# Patient Record
Sex: Male | Born: 1940 | Race: White | Hispanic: No | Marital: Married | Smoking: Former smoker
Health system: Southern US, Community
[De-identification: ages and names within clinical notes are randomized; demographics above are authoritative.]

## PROBLEM LIST (undated history)

## (undated) DIAGNOSIS — M75121 Complete rotator cuff tear or rupture of right shoulder, not specified as traumatic: Secondary | ICD-10-CM

## (undated) DIAGNOSIS — I2510 Atherosclerotic heart disease of native coronary artery without angina pectoris: Secondary | ICD-10-CM

## (undated) DIAGNOSIS — M1990 Unspecified osteoarthritis, unspecified site: Secondary | ICD-10-CM

## (undated) DIAGNOSIS — Z7901 Long term (current) use of anticoagulants: Secondary | ICD-10-CM

## (undated) DIAGNOSIS — Z8719 Personal history of other diseases of the digestive system: Secondary | ICD-10-CM

## (undated) DIAGNOSIS — Z7902 Long term (current) use of antithrombotics/antiplatelets: Secondary | ICD-10-CM

## (undated) DIAGNOSIS — E119 Type 2 diabetes mellitus without complications: Secondary | ICD-10-CM

## (undated) DIAGNOSIS — R011 Cardiac murmur, unspecified: Secondary | ICD-10-CM

## (undated) DIAGNOSIS — E039 Hypothyroidism, unspecified: Secondary | ICD-10-CM

## (undated) DIAGNOSIS — D367 Benign neoplasm of other specified sites: Secondary | ICD-10-CM

## (undated) DIAGNOSIS — K5792 Diverticulitis of intestine, part unspecified, without perforation or abscess without bleeding: Secondary | ICD-10-CM

## (undated) DIAGNOSIS — K219 Gastro-esophageal reflux disease without esophagitis: Secondary | ICD-10-CM

## (undated) DIAGNOSIS — D126 Benign neoplasm of colon, unspecified: Secondary | ICD-10-CM

## (undated) DIAGNOSIS — I639 Cerebral infarction, unspecified: Secondary | ICD-10-CM

## (undated) DIAGNOSIS — I10 Essential (primary) hypertension: Secondary | ICD-10-CM

## (undated) DIAGNOSIS — E7800 Pure hypercholesterolemia, unspecified: Secondary | ICD-10-CM

## (undated) DIAGNOSIS — D369 Benign neoplasm, unspecified site: Secondary | ICD-10-CM

## (undated) DIAGNOSIS — C4490 Unspecified malignant neoplasm of skin, unspecified: Secondary | ICD-10-CM

## (undated) DIAGNOSIS — R519 Headache, unspecified: Secondary | ICD-10-CM

## (undated) DIAGNOSIS — I779 Disorder of arteries and arterioles, unspecified: Secondary | ICD-10-CM

## (undated) DIAGNOSIS — I4891 Unspecified atrial fibrillation: Secondary | ICD-10-CM

## (undated) DIAGNOSIS — K2270 Barrett's esophagus without dysplasia: Secondary | ICD-10-CM

## (undated) DIAGNOSIS — R7303 Prediabetes: Secondary | ICD-10-CM

## (undated) DIAGNOSIS — C801 Malignant (primary) neoplasm, unspecified: Secondary | ICD-10-CM

## (undated) HISTORY — PX: VASECTOMY: SHX75

## (undated) HISTORY — PX: CARDIAC CATHETERIZATION: SHX172

## (undated) HISTORY — PX: NASAL SINUS SURGERY: SHX719

## (undated) HISTORY — PX: KNEE ARTHROSCOPY: SHX127

---

## 2005-12-04 ENCOUNTER — Ambulatory Visit: Payer: Self-pay | Admitting: Gastroenterology

## 2008-03-20 ENCOUNTER — Ambulatory Visit: Payer: Self-pay | Admitting: Family Medicine

## 2009-03-30 ENCOUNTER — Ambulatory Visit: Payer: Self-pay | Admitting: Cardiology

## 2009-03-30 DIAGNOSIS — I2510 Atherosclerotic heart disease of native coronary artery without angina pectoris: Secondary | ICD-10-CM

## 2009-03-30 HISTORY — PX: CORONARY ANGIOPLASTY WITH STENT PLACEMENT: SHX49

## 2009-03-30 HISTORY — DX: Atherosclerotic heart disease of native coronary artery without angina pectoris: I25.10

## 2010-03-10 ENCOUNTER — Ambulatory Visit: Payer: Self-pay | Admitting: Gastroenterology

## 2010-03-11 LAB — PATHOLOGY REPORT

## 2010-05-05 ENCOUNTER — Ambulatory Visit: Payer: Self-pay | Admitting: Otolaryngology

## 2012-01-05 ENCOUNTER — Ambulatory Visit: Payer: Self-pay | Admitting: Internal Medicine

## 2013-06-05 DIAGNOSIS — I639 Cerebral infarction, unspecified: Secondary | ICD-10-CM

## 2013-06-05 DIAGNOSIS — G459 Transient cerebral ischemic attack, unspecified: Secondary | ICD-10-CM

## 2013-06-05 HISTORY — DX: Cerebral infarction, unspecified: I63.9

## 2013-06-05 HISTORY — DX: Transient cerebral ischemic attack, unspecified: G45.9

## 2013-07-21 ENCOUNTER — Ambulatory Visit: Payer: Self-pay | Admitting: Internal Medicine

## 2013-08-21 DIAGNOSIS — M169 Osteoarthritis of hip, unspecified: Secondary | ICD-10-CM | POA: Insufficient documentation

## 2013-08-21 DIAGNOSIS — I2510 Atherosclerotic heart disease of native coronary artery without angina pectoris: Secondary | ICD-10-CM | POA: Insufficient documentation

## 2013-08-21 DIAGNOSIS — K219 Gastro-esophageal reflux disease without esophagitis: Secondary | ICD-10-CM | POA: Insufficient documentation

## 2013-08-21 DIAGNOSIS — I10 Essential (primary) hypertension: Secondary | ICD-10-CM | POA: Insufficient documentation

## 2013-08-21 DIAGNOSIS — E7800 Pure hypercholesterolemia, unspecified: Secondary | ICD-10-CM | POA: Insufficient documentation

## 2013-08-21 DIAGNOSIS — K5732 Diverticulitis of large intestine without perforation or abscess without bleeding: Secondary | ICD-10-CM | POA: Insufficient documentation

## 2013-08-21 DIAGNOSIS — M1990 Unspecified osteoarthritis, unspecified site: Secondary | ICD-10-CM | POA: Insufficient documentation

## 2013-08-21 DIAGNOSIS — K625 Hemorrhage of anus and rectum: Secondary | ICD-10-CM | POA: Insufficient documentation

## 2013-08-21 DIAGNOSIS — M1610 Unilateral primary osteoarthritis, unspecified hip: Secondary | ICD-10-CM | POA: Insufficient documentation

## 2013-08-21 DIAGNOSIS — D126 Benign neoplasm of colon, unspecified: Secondary | ICD-10-CM | POA: Insufficient documentation

## 2014-06-25 ENCOUNTER — Ambulatory Visit: Payer: Self-pay | Admitting: Gastroenterology

## 2014-06-25 DIAGNOSIS — D122 Benign neoplasm of ascending colon: Secondary | ICD-10-CM | POA: Diagnosis not present

## 2014-06-25 DIAGNOSIS — K5730 Diverticulosis of large intestine without perforation or abscess without bleeding: Secondary | ICD-10-CM | POA: Diagnosis not present

## 2014-06-25 DIAGNOSIS — K210 Gastro-esophageal reflux disease with esophagitis: Secondary | ICD-10-CM | POA: Diagnosis not present

## 2014-06-25 DIAGNOSIS — Z86010 Personal history of colonic polyps: Secondary | ICD-10-CM | POA: Diagnosis not present

## 2014-06-25 DIAGNOSIS — Z955 Presence of coronary angioplasty implant and graft: Secondary | ICD-10-CM | POA: Diagnosis not present

## 2014-06-25 DIAGNOSIS — I10 Essential (primary) hypertension: Secondary | ICD-10-CM | POA: Diagnosis not present

## 2014-06-25 DIAGNOSIS — K2270 Barrett's esophagus without dysplasia: Secondary | ICD-10-CM | POA: Diagnosis not present

## 2014-06-25 DIAGNOSIS — I2510 Atherosclerotic heart disease of native coronary artery without angina pectoris: Secondary | ICD-10-CM | POA: Diagnosis not present

## 2014-06-25 DIAGNOSIS — K449 Diaphragmatic hernia without obstruction or gangrene: Secondary | ICD-10-CM | POA: Diagnosis not present

## 2014-06-25 DIAGNOSIS — K209 Esophagitis, unspecified: Secondary | ICD-10-CM | POA: Diagnosis not present

## 2014-09-28 LAB — SURGICAL PATHOLOGY

## 2014-10-21 DIAGNOSIS — H61031 Chondritis of right external ear: Secondary | ICD-10-CM | POA: Diagnosis not present

## 2014-10-21 DIAGNOSIS — I10 Essential (primary) hypertension: Secondary | ICD-10-CM | POA: Diagnosis not present

## 2014-10-21 DIAGNOSIS — Z85828 Personal history of other malignant neoplasm of skin: Secondary | ICD-10-CM | POA: Diagnosis not present

## 2014-10-21 DIAGNOSIS — Z789 Other specified health status: Secondary | ICD-10-CM | POA: Diagnosis not present

## 2014-10-21 DIAGNOSIS — L570 Actinic keratosis: Secondary | ICD-10-CM | POA: Diagnosis not present

## 2014-10-21 DIAGNOSIS — I2510 Atherosclerotic heart disease of native coronary artery without angina pectoris: Secondary | ICD-10-CM | POA: Diagnosis not present

## 2014-10-21 DIAGNOSIS — Z08 Encounter for follow-up examination after completed treatment for malignant neoplasm: Secondary | ICD-10-CM | POA: Diagnosis not present

## 2014-10-21 DIAGNOSIS — E780 Pure hypercholesterolemia: Secondary | ICD-10-CM | POA: Diagnosis not present

## 2014-11-30 DIAGNOSIS — I2510 Atherosclerotic heart disease of native coronary artery without angina pectoris: Secondary | ICD-10-CM | POA: Diagnosis not present

## 2015-02-24 ENCOUNTER — Other Ambulatory Visit: Payer: Self-pay | Admitting: Neurology

## 2015-02-24 DIAGNOSIS — M79671 Pain in right foot: Secondary | ICD-10-CM | POA: Diagnosis not present

## 2015-02-24 DIAGNOSIS — M722 Plantar fascial fibromatosis: Secondary | ICD-10-CM | POA: Diagnosis not present

## 2015-02-24 DIAGNOSIS — C719 Malignant neoplasm of brain, unspecified: Secondary | ICD-10-CM

## 2015-03-05 ENCOUNTER — Ambulatory Visit
Admission: RE | Admit: 2015-03-05 | Discharge: 2015-03-05 | Disposition: A | Discharge status: Home / Self Care | Payer: Commercial Managed Care - HMO | Source: Ambulatory Visit | Attending: Neurology | Admitting: Neurology

## 2015-03-05 DIAGNOSIS — G939 Disorder of brain, unspecified: Secondary | ICD-10-CM | POA: Diagnosis not present

## 2015-03-05 DIAGNOSIS — C719 Malignant neoplasm of brain, unspecified: Secondary | ICD-10-CM | POA: Insufficient documentation

## 2015-03-05 DIAGNOSIS — H539 Unspecified visual disturbance: Secondary | ICD-10-CM | POA: Diagnosis not present

## 2015-03-05 MED ORDER — GADOBENATE DIMEGLUMINE 529 MG/ML IV SOLN
20.0000 mL | Freq: Once | INTRAVENOUS | Status: AC | PRN
  Administered 2015-03-05: 17 mL via INTRAVENOUS

## 2015-03-25 DIAGNOSIS — G44221 Chronic tension-type headache, intractable: Secondary | ICD-10-CM | POA: Diagnosis not present

## 2015-03-25 DIAGNOSIS — R930 Abnormal findings on diagnostic imaging of skull and head, not elsewhere classified: Secondary | ICD-10-CM | POA: Diagnosis not present

## 2015-03-25 DIAGNOSIS — E669 Obesity, unspecified: Secondary | ICD-10-CM | POA: Diagnosis not present

## 2015-03-25 DIAGNOSIS — C719 Malignant neoplasm of brain, unspecified: Secondary | ICD-10-CM | POA: Diagnosis not present

## 2015-03-25 DIAGNOSIS — G43519 Persistent migraine aura without cerebral infarction, intractable, without status migrainosus: Secondary | ICD-10-CM | POA: Diagnosis not present

## 2015-04-06 DIAGNOSIS — C719 Malignant neoplasm of brain, unspecified: Secondary | ICD-10-CM | POA: Insufficient documentation

## 2015-04-06 DIAGNOSIS — G43119 Migraine with aura, intractable, without status migrainosus: Secondary | ICD-10-CM | POA: Insufficient documentation

## 2015-05-03 DIAGNOSIS — Z08 Encounter for follow-up examination after completed treatment for malignant neoplasm: Secondary | ICD-10-CM | POA: Diagnosis not present

## 2015-05-03 DIAGNOSIS — Z1283 Encounter for screening for malignant neoplasm of skin: Secondary | ICD-10-CM | POA: Diagnosis not present

## 2015-05-03 DIAGNOSIS — Z85828 Personal history of other malignant neoplasm of skin: Secondary | ICD-10-CM | POA: Diagnosis not present

## 2015-05-03 DIAGNOSIS — Z872 Personal history of diseases of the skin and subcutaneous tissue: Secondary | ICD-10-CM | POA: Diagnosis not present

## 2015-05-03 DIAGNOSIS — L728 Other follicular cysts of the skin and subcutaneous tissue: Secondary | ICD-10-CM | POA: Diagnosis not present

## 2015-05-13 DIAGNOSIS — I2510 Atherosclerotic heart disease of native coronary artery without angina pectoris: Secondary | ICD-10-CM | POA: Diagnosis not present

## 2015-05-13 DIAGNOSIS — E7800 Pure hypercholesterolemia, unspecified: Secondary | ICD-10-CM | POA: Diagnosis not present

## 2015-05-13 DIAGNOSIS — I10 Essential (primary) hypertension: Secondary | ICD-10-CM | POA: Diagnosis not present

## 2015-08-13 DIAGNOSIS — M25512 Pain in left shoulder: Secondary | ICD-10-CM | POA: Diagnosis not present

## 2015-09-06 DIAGNOSIS — I10 Essential (primary) hypertension: Secondary | ICD-10-CM | POA: Diagnosis not present

## 2015-09-06 DIAGNOSIS — E039 Hypothyroidism, unspecified: Secondary | ICD-10-CM | POA: Diagnosis not present

## 2015-09-06 DIAGNOSIS — E7800 Pure hypercholesterolemia, unspecified: Secondary | ICD-10-CM | POA: Diagnosis not present

## 2015-09-06 DIAGNOSIS — E669 Obesity, unspecified: Secondary | ICD-10-CM | POA: Diagnosis not present

## 2015-09-06 DIAGNOSIS — Z0000 Encounter for general adult medical examination without abnormal findings: Secondary | ICD-10-CM | POA: Diagnosis not present

## 2015-09-06 DIAGNOSIS — Z79899 Other long term (current) drug therapy: Secondary | ICD-10-CM | POA: Diagnosis not present

## 2015-09-06 DIAGNOSIS — Z125 Encounter for screening for malignant neoplasm of prostate: Secondary | ICD-10-CM | POA: Diagnosis not present

## 2015-09-06 DIAGNOSIS — M25512 Pain in left shoulder: Secondary | ICD-10-CM | POA: Diagnosis not present

## 2015-09-06 DIAGNOSIS — I2510 Atherosclerotic heart disease of native coronary artery without angina pectoris: Secondary | ICD-10-CM | POA: Diagnosis not present

## 2015-09-09 DIAGNOSIS — K22719 Barrett's esophagus with dysplasia, unspecified: Secondary | ICD-10-CM | POA: Diagnosis not present

## 2015-09-09 DIAGNOSIS — K219 Gastro-esophageal reflux disease without esophagitis: Secondary | ICD-10-CM | POA: Diagnosis not present

## 2015-09-22 DIAGNOSIS — Z79899 Other long term (current) drug therapy: Secondary | ICD-10-CM | POA: Diagnosis not present

## 2015-09-22 DIAGNOSIS — E039 Hypothyroidism, unspecified: Secondary | ICD-10-CM | POA: Diagnosis not present

## 2015-09-22 DIAGNOSIS — E7800 Pure hypercholesterolemia, unspecified: Secondary | ICD-10-CM | POA: Diagnosis not present

## 2015-09-22 DIAGNOSIS — Z125 Encounter for screening for malignant neoplasm of prostate: Secondary | ICD-10-CM | POA: Diagnosis not present

## 2015-09-22 DIAGNOSIS — I10 Essential (primary) hypertension: Secondary | ICD-10-CM | POA: Diagnosis not present

## 2015-09-22 DIAGNOSIS — R7309 Other abnormal glucose: Secondary | ICD-10-CM | POA: Diagnosis not present

## 2015-10-25 ENCOUNTER — Ambulatory Visit
Admission: RE | Admit: 2015-10-25 | Payer: Commercial Managed Care - HMO | Source: Ambulatory Visit | Admitting: Gastroenterology

## 2015-10-25 ENCOUNTER — Encounter: Admission: RE | Payer: Self-pay | Source: Ambulatory Visit

## 2015-10-25 SURGERY — ESOPHAGOGASTRODUODENOSCOPY (EGD) WITH PROPOFOL
Anesthesia: General

## 2015-10-27 ENCOUNTER — Encounter: Payer: Self-pay | Admitting: *Deleted

## 2015-10-28 ENCOUNTER — Ambulatory Visit: Payer: Commercial Managed Care - HMO | Admitting: Anesthesiology

## 2015-10-28 ENCOUNTER — Encounter: Payer: Self-pay | Admitting: *Deleted

## 2015-10-28 ENCOUNTER — Ambulatory Visit
Admission: RE | Admit: 2015-10-28 | Discharge: 2015-10-28 | Disposition: A | Discharge status: Home / Self Care | Payer: Commercial Managed Care - HMO | Source: Ambulatory Visit | Attending: Gastroenterology | Admitting: Gastroenterology

## 2015-10-28 ENCOUNTER — Encounter
Admission: RE | Disposition: A | Discharge status: Home / Self Care | Payer: Self-pay | Source: Ambulatory Visit | Attending: Gastroenterology

## 2015-10-28 DIAGNOSIS — Z7982 Long term (current) use of aspirin: Secondary | ICD-10-CM | POA: Diagnosis not present

## 2015-10-28 DIAGNOSIS — K2270 Barrett's esophagus without dysplasia: Secondary | ICD-10-CM | POA: Insufficient documentation

## 2015-10-28 DIAGNOSIS — Z87891 Personal history of nicotine dependence: Secondary | ICD-10-CM | POA: Insufficient documentation

## 2015-10-28 DIAGNOSIS — Z79899 Other long term (current) drug therapy: Secondary | ICD-10-CM | POA: Insufficient documentation

## 2015-10-28 DIAGNOSIS — Z86010 Personal history of colonic polyps: Secondary | ICD-10-CM | POA: Diagnosis not present

## 2015-10-28 DIAGNOSIS — E039 Hypothyroidism, unspecified: Secondary | ICD-10-CM | POA: Diagnosis not present

## 2015-10-28 DIAGNOSIS — M1990 Unspecified osteoarthritis, unspecified site: Secondary | ICD-10-CM | POA: Diagnosis not present

## 2015-10-28 DIAGNOSIS — I2510 Atherosclerotic heart disease of native coronary artery without angina pectoris: Secondary | ICD-10-CM | POA: Insufficient documentation

## 2015-10-28 DIAGNOSIS — E7800 Pure hypercholesterolemia, unspecified: Secondary | ICD-10-CM | POA: Insufficient documentation

## 2015-10-28 DIAGNOSIS — K219 Gastro-esophageal reflux disease without esophagitis: Secondary | ICD-10-CM | POA: Diagnosis not present

## 2015-10-28 DIAGNOSIS — Z791 Long term (current) use of non-steroidal anti-inflammatories (NSAID): Secondary | ICD-10-CM | POA: Insufficient documentation

## 2015-10-28 DIAGNOSIS — Z8673 Personal history of transient ischemic attack (TIA), and cerebral infarction without residual deficits: Secondary | ICD-10-CM | POA: Diagnosis not present

## 2015-10-28 HISTORY — DX: Benign neoplasm of colon, unspecified: D12.6

## 2015-10-28 HISTORY — DX: Atherosclerotic heart disease of native coronary artery without angina pectoris: I25.10

## 2015-10-28 HISTORY — DX: Benign neoplasm of other specified sites: D36.7

## 2015-10-28 HISTORY — DX: Barrett's esophagus without dysplasia: K22.70

## 2015-10-28 HISTORY — DX: Hypothyroidism, unspecified: E03.9

## 2015-10-28 HISTORY — DX: Pure hypercholesterolemia, unspecified: E78.00

## 2015-10-28 HISTORY — PX: ESOPHAGOGASTRODUODENOSCOPY (EGD) WITH PROPOFOL: SHX5813

## 2015-10-28 HISTORY — DX: Cerebral infarction, unspecified: I63.9

## 2015-10-28 HISTORY — DX: Unspecified osteoarthritis, unspecified site: M19.90

## 2015-10-28 HISTORY — DX: Gastro-esophageal reflux disease without esophagitis: K21.9

## 2015-10-28 SURGERY — ESOPHAGOGASTRODUODENOSCOPY (EGD) WITH PROPOFOL
Anesthesia: General

## 2015-10-28 MED ORDER — SODIUM CHLORIDE 0.9 % IV SOLN
INTRAVENOUS | Status: DC
  Administered 2015-10-28: 09:00:00 via INTRAVENOUS

## 2015-10-28 MED ORDER — PROPOFOL 10 MG/ML IV BOLUS
INTRAVENOUS | Status: DC | PRN
  Administered 2015-10-28: 50 mg via INTRAVENOUS

## 2015-10-28 MED ORDER — LIDOCAINE HCL (CARDIAC) 20 MG/ML IV SOLN
INTRAVENOUS | Status: DC | PRN
  Administered 2015-10-28: 60 mg via INTRAVENOUS

## 2015-10-28 MED ORDER — PROPOFOL 500 MG/50ML IV EMUL
INTRAVENOUS | Status: DC | PRN
  Administered 2015-10-28: 150 ug/kg/min via INTRAVENOUS

## 2015-10-28 MED ORDER — STERILE WATER FOR INJECTION IJ SOLN
Freq: Once | INTRAMUSCULAR | Status: DC
  Filled 2015-10-28: qty 3

## 2015-10-28 MED ORDER — MIDAZOLAM HCL 2 MG/2ML IJ SOLN
INTRAMUSCULAR | Status: DC | PRN
  Administered 2015-10-28: 1 mg via INTRAVENOUS

## 2015-10-28 MED ORDER — SODIUM CHLORIDE 0.9 % IV SOLN
INTRAVENOUS | Status: DC | PRN
  Administered 2015-10-28: 09:00:00 via INTRAVENOUS

## 2015-10-28 NOTE — Anesthesia Preprocedure Evaluation (Signed)
Anesthesia Evaluation  Patient identified by MRN, date of birth, ID band Patient awake    Reviewed: Allergy & Precautions, H&P , NPO status , Patient's Chart, lab work & pertinent test results  History of Anesthesia Complications Negative for: history of anesthetic complications  Airway Mallampati: III  TM Distance: >3 FB Neck ROM: limited    Dental  (+) Poor Dentition, Chipped, Missing, Partial Upper   Pulmonary neg shortness of breath, former smoker,    Pulmonary exam normal breath sounds clear to auscultation       Cardiovascular Exercise Tolerance: Good (-) angina+ CAD and + Cardiac Stents  (-) DOE Normal cardiovascular exam Rhythm:regular Rate:Normal     Neuro/Psych CVA, Residual Symptoms negative psych ROS   GI/Hepatic Neg liver ROS, GERD  Controlled and Medicated,  Endo/Other  Hypothyroidism   Renal/GU negative Renal ROS  negative genitourinary   Musculoskeletal  (+) Arthritis ,   Abdominal   Peds  Hematology negative hematology ROS (+)   Anesthesia Other Findings Past Medical History:   Barrett esophagus                                            Benign neoplasm of abdomen                                   Benign neoplasm of colon                                     GERD (gastroesophageal reflux disease)                       Arthritis                                                    Hypercholesterolemia                                         Hypothyroidism                                              Past Surgical History:   KNEE ARTHROSCOPY                                              VASECTOMY                                                       Reproductive/Obstetrics negative OB ROS  Anesthesia Physical Anesthesia Plan  ASA: III  Anesthesia Plan: General   Post-op Pain Management:    Induction:   Airway Management Planned:    Additional Equipment:   Intra-op Plan:   Post-operative Plan:   Informed Consent: I have reviewed the patients History and Physical, chart, labs and discussed the procedure including the risks, benefits and alternatives for the proposed anesthesia with the patient or authorized representative who has indicated his/her understanding and acceptance.   Dental Advisory Given  Plan Discussed with: Anesthesiologist, CRNA and Surgeon  Anesthesia Plan Comments:         Anesthesia Quick Evaluation

## 2015-10-28 NOTE — H&P (Signed)
Primary Care Physician:  Idelle Crouch, MD Primary Gastroenterologist:  Dr. Candace Cruise  Pre-Procedure History & Physical: HPI:  Warren Ingram is a 75 y.o. male is here for an EGD for possible Barrx..   Past Medical History  Diagnosis Date  . Barrett esophagus   . Benign neoplasm of abdomen   . Benign neoplasm of colon   . GERD (gastroesophageal reflux disease)   . Arthritis   . Hypercholesterolemia   . Hypothyroidism   . Stroke ([place])   . Coronary artery disease     Past Surgical History  Procedure Laterality Date  . Knee arthroscopy    . Vasectomy    . Cardiac catheterization      Prior to Admission medications   Medication Sig Start Date End Date Taking? Authorizing Provider  aspirin (ASPIRIN EC) 81 MG EC tablet Take 81 mg by mouth daily. Swallow whole.   Yes Historical Provider, MD  atorvastatin (LIPITOR) 80 MG tablet Take 80 mg by mouth daily.   Yes Historical Provider, MD  calcium carbonate (TITRALAC) 420 MG CHEW chewable tablet Chew 420 mg by mouth as needed for indigestion or heartburn.   Yes Historical Provider, MD  diclofenac sodium (VOLTAREN) 1 % GEL Apply 2 g topically 2 (two) times daily.   Yes Historical Provider, MD  gemfibrozil (LOPID) 600 MG tablet Take 600 mg by mouth 2 (two) times daily before a meal.   Yes Historical Provider, MD  ibuprofen (ADVIL,MOTRIN) 200 MG tablet Take 200 mg by mouth every 6 (six) hours as needed.   Yes Historical Provider, MD  levothyroxine (SYNTHROID, LEVOTHROID) 150 MCG tablet Take 150 mcg by mouth daily before breakfast.   Yes Historical Provider, MD  omega-3 acid ethyl esters (LOVAZA) 1 g capsule Take 2 g by mouth 2 (two) times daily.   Yes Historical Provider, MD  omeprazole (PRILOSEC) 20 MG capsule Take 20 mg by mouth daily.   Yes Historical Provider, MD  prasugrel (EFFIENT) 10 MG TABS tablet Take 10 mg by mouth daily.   Yes Historical Provider, MD  sildenafil (VIAGRA) 100 MG tablet Take 100 mg by mouth daily as needed for  erectile dysfunction.   Yes Historical Provider, MD  topiramate (TOPAMAX) 50 MG tablet Take 50 mg by mouth daily.   Yes Historical Provider, MD    Allergies as of 10/20/2015  . (Not on File)    History reviewed. No pertinent family history.  Social History   Social History  . Marital Status: Married    Spouse Name: N/A  . Number of Children: N/A  . Years of Education: N/A   Occupational History  . Not on file.   Social History Main Topics  . Smoking status: Former Smoker -- 1.00 packs/day  . Smokeless tobacco: Never Used  . Alcohol Use: Yes  . Drug Use: No  . Sexual Activity: Not on file   Other Topics Concern  . Not on file   Social History Narrative    Review of Systems: See HPI, otherwise negative ROS  Physical Exam: BP 179/71 mmHg  Pulse 66  Temp(Src) 96.5 F (35.8 C) (Tympanic)  Resp 21  Ht 5\' 3"  (1.6 m)  Wt 180 lb (81.647 kg)  BMI 31.89 kg/m2  SpO2 96% General:   Alert,  pleasant and cooperative in NAD Head:  Normocephalic and atraumatic. Neck:  Supple; no masses or thyromegaly. Lungs:  Clear throughout to auscultation.    Heart:  Regular rate and rhythm. Abdomen:  Soft, nontender  and nondistended. Normal bowel sounds, without guarding, and without rebound.   Neurologic:  Alert and  oriented x4;  grossly normal neurologically.  Impression/Plan: [name] is here for an EGD to be performed for long segment Barrett's and possible Barrx .  Risks, benefits, limitations, and alternatives regarding EGD with Barrx have been reviewed with the patient.  Questions have been answered.  All parties agreeable.   Maralee Higuchi, Lupita Dawn, MD  10/28/2015, 10:22 AM

## 2015-10-28 NOTE — Transfer of Care (Signed)
Immediate Anesthesia Transfer of Care Note  Patient: Warren Ingram  Procedure(s) Performed: Procedure(s): ESOPHAGOGASTRODUODENOSCOPY (EGD) WITH PROPOFOL (N/A)  Patient Location: Endoscopy Unit  Anesthesia Type:General  Level of Consciousness: awake, alert , oriented and patient cooperative  Airway & Oxygen Therapy: Patient Spontanous Breathing and Patient connected to nasal cannula oxygen  Post-op Assessment: Report given to RN, Post -op Vital signs reviewed and stable and Patient moving all extremities X 4  Post vital signs: Reviewed and stable  Last Vitals:  Filed Vitals:   10/28/15 0900  BP: 179/82  Pulse: 63  Temp: 36.1 C  Resp: 16    Last Pain: There were no vitals filed for this visit.       Complications: No apparent anesthesia complications

## 2015-10-28 NOTE — Anesthesia Postprocedure Evaluation (Signed)
Anesthesia Post Note  Patient: Warren Ingram  Procedure(s) Performed: Procedure(s) (LRB): ESOPHAGOGASTRODUODENOSCOPY (EGD) WITH PROPOFOL (N/A)  Patient location during evaluation: Endoscopy Anesthesia Type: General Level of consciousness: awake and alert Pain management: pain level controlled Vital Signs Assessment: post-procedure vital signs reviewed and stable Respiratory status: spontaneous breathing, nonlabored ventilation, respiratory function stable and patient connected to nasal cannula oxygen Cardiovascular status: blood pressure returned to baseline and stable Postop Assessment: no signs of nausea or vomiting Anesthetic complications: no    Last Vitals:  Filed Vitals:   10/28/15 1030 10/28/15 1040  BP: 179/88 191/93  Pulse: 65 64  Temp:    Resp: 18 19    Last Pain: There were no vitals filed for this visit.               Precious Haws Irina Okelly

## 2015-10-28 NOTE — Op Note (Signed)
[facility] Gastroenterology Patient Name: Warren Ingram Procedure Date: 10/28/2015 9:29 AM MRN: [id number] Account #: [id number] Date of Birth: 11-04-1940 Admit Type: Outpatient Age: 75 Room: [facility] ENDO ROOM 4 Gender: Male Note Status: Finalized Procedure:            Upper GI endoscopy Indications:          For therapy of Barrett's esophagus, Has long segment                        Barrett's Providers:            Lupita Dawn. Candace Cruise, MD Referring MD:         Leonie Douglas. Doy Hutching, MD (Referring MD) Medicines:            Monitored Anesthesia Care Complications:        No immediate complications. Procedure:            Pre-Anesthesia Assessment:                       - Prior to the procedure, a History and Physical was                        performed, and patient medications, allergies and                        sensitivities were reviewed. The patient's tolerance of                        previous anesthesia was reviewed.                       - The risks and benefits of the procedure and the                        sedation options and risks were discussed with the                        patient. All questions were answered and informed                        consent was obtained.                       - After reviewing the risks and benefits, the patient                        was deemed in satisfactory condition to undergo the                        procedure.                       After obtaining informed consent, the endoscope was                        passed under direct vision. Throughout the procedure,                        the patient's blood pressure, pulse, and oxygen  saturations were monitored continuously. The Endoscope                        was introduced through the mouth, and advanced to the                        second part of duodenum. The upper GI endoscopy was                        accomplished without difficulty. The patient  tolerated                        the procedure well. Findings:      The esophagus and gastroesophageal junction were examined with white       light and narrow band imaging (NBI) from a forward view and retroflexed       position. There were esophageal mucosal changes consistent with       long-segment Barrett's esophagus. These changes involved the mucosa at       the upper extent of the gastric folds (37 cm from the incisors)       extending to the Z-line (33 cm from the incisors). Squamous islands were       present from 33 to 35 cm. The maximum longitudinal extent of these       esophageal mucosal changes was 4 cm in length. Circumferential       radiofrequency ablation of Barrett's esophagus was performed using the       Barrx 360 Express catheter and balloon-based endoscopic ablation system.       With the endoscope in place, the position and extent of the Barrett's       mucosa and the anatomic landmarks including proximal and distal extent       of Barrett's mucosa were noted. Endoscopic visualization identified an       ablation site including the entire visible Barrett's segment. The       Barrett's mucosa was irrigated with N-acetylcysteine (Mucomyst) 1% mixed       with water. Esophageal contents were suctioned. A guidewire was passed       down the biopsy channel of the endoscope. As the endoscope was withdrawn       from the mouth, the guidewire was left in place. An auto-sizing       radiofrequency ablation balloon catheter was passed transorally over the       guidewire into the esophagus. The endoscope was introduced in a       side-by-side manner with the ablation catheter. Under direct endoscopic       visualization, the balloon ablation catheter was positioned so that the       proximal edge of the electrode was at 32 cm from the incisors. The       balloon was automatically inflated, and energy was applied at 10 J/cm2.       The balloon electrode was moved 2 cm  distally, so that the proximal edge       of the electrode was aligned with the distal edge of the ablation zone.       The process of balloon inflation and ablation was repeated until the top       of the gastric folds was reached. The ablation catheter and guidewire       were removed, and the balloon  was cleaned. The ablation zone was then       cleaned of overlying coagulative debris using irrigation and suction via       the endoscope and a cleaning cap. The guidewire was reinserted, and then       the ablation catheter was reintroduced into the esophagus over the wire.       The ablation catheter was positioned under direct endoscopic       visualization so that the proximal edge of the electrode was at the       proximal edge of the ablation zone. Reinflation and a second round of       ablation were performed with the application of 10 J/cm2 to re-treat the       Barrett's epithelium already treated with the first round of ablation.       The ablation catheter and guidewire were then removed. The areas of the       esophagus where Barrett's mucosa had been ablated were then examined       with the endoscope. Areas of Barrett's esophagus were completely       ablated. Whitish changes of ablated mucosa were present. Total of 4       treatments were given. Had nose bleeding from having nasal trumpet put       in.      The exam was otherwise without abnormality.      The entire examined stomach was normal.      The examined duodenum was normal. Impression:           - Esophageal mucosal changes consistent with                        long-segment Barrett's esophagus. Treated with                        radiofrequency ablation.                       - The examination was otherwise normal.                       - Normal stomach.                       - Normal examined duodenum.                       - No specimens collected. Recommendation:       - Discharge patient to home.                        - Observe patient's clinical course.                       - The findings and recommendations were discussed with                        the patient.                       - Full liquid diet today.                       - Will likely need another EGD with Barrx in 2-3 months  elsewhere. Procedure Code(s):    --- Professional ---                       [phone], Esophagogastroduodenoscopy, flexible, transoral;                        with ablation of tumor(s), polyp(s), or other lesion(s)                        (includes pre- and post-dilation and guide wire                        passage, when performed) Diagnosis Code(s):    --- Professional ---                       K22.70, Barrett's esophagus without dysplasia CPT copyright 2016 [employer]. All rights reserved. The codes documented in this report are preliminary and upon coder review may  be revised to meet current compliance requirements. Hulen Luster, MD 10/28/2015 10:12:16 AM This report has been signed electronically. Number of Addenda: 0 Note Initiated On: 10/28/2015 9:29 AM      [facility]

## 2015-10-29 ENCOUNTER — Encounter: Payer: Self-pay | Admitting: Gastroenterology

## 2015-11-04 DIAGNOSIS — I10 Essential (primary) hypertension: Secondary | ICD-10-CM | POA: Diagnosis not present

## 2015-11-04 DIAGNOSIS — I2510 Atherosclerotic heart disease of native coronary artery without angina pectoris: Secondary | ICD-10-CM | POA: Diagnosis not present

## 2015-12-13 ENCOUNTER — Telehealth: Payer: Self-pay | Admitting: Gastroenterology

## 2015-12-13 NOTE — Telephone Encounter (Signed)
Patient of Dr. Candace Cruise. Will need an appointment for Barrett esophagus. He will be due in August or Sept. Please call to make him an appointment

## 2015-12-14 NOTE — Telephone Encounter (Signed)
Spoke with pt's wife regarding scheduling repeat EGD for Barrett's. She just had shoulder surgery and feels August would be best for both. She will discuss with her husband and call back to set in in August.

## 2015-12-15 NOTE — Telephone Encounter (Signed)
Left pt's vm letting them know Dr. Allen Norris does not treat Barrett's esophagus which is why [facility] clinic referred him to [facility].

## 2015-12-15 NOTE — Telephone Encounter (Signed)
Patients wife, Vaughan Basta, is returning your phone call

## 2015-12-16 NOTE — Telephone Encounter (Signed)
Left vm again for pt to return my call.  

## 2016-01-17 DIAGNOSIS — M1711 Unilateral primary osteoarthritis, right knee: Secondary | ICD-10-CM | POA: Diagnosis not present

## 2016-01-17 DIAGNOSIS — M2391 Unspecified internal derangement of right knee: Secondary | ICD-10-CM | POA: Diagnosis not present

## 2016-02-03 DIAGNOSIS — M25561 Pain in right knee: Secondary | ICD-10-CM | POA: Diagnosis not present

## 2016-03-07 DIAGNOSIS — R7309 Other abnormal glucose: Secondary | ICD-10-CM | POA: Diagnosis not present

## 2016-03-07 DIAGNOSIS — I10 Essential (primary) hypertension: Secondary | ICD-10-CM | POA: Diagnosis not present

## 2016-03-07 DIAGNOSIS — K22719 Barrett's esophagus with dysplasia, unspecified: Secondary | ICD-10-CM | POA: Diagnosis not present

## 2016-03-07 DIAGNOSIS — E7800 Pure hypercholesterolemia, unspecified: Secondary | ICD-10-CM | POA: Diagnosis not present

## 2016-03-07 DIAGNOSIS — E039 Hypothyroidism, unspecified: Secondary | ICD-10-CM | POA: Diagnosis not present

## 2016-03-07 DIAGNOSIS — Z79899 Other long term (current) drug therapy: Secondary | ICD-10-CM | POA: Diagnosis not present

## 2016-03-14 DIAGNOSIS — I10 Essential (primary) hypertension: Secondary | ICD-10-CM | POA: Diagnosis not present

## 2016-03-14 DIAGNOSIS — Z79899 Other long term (current) drug therapy: Secondary | ICD-10-CM | POA: Diagnosis not present

## 2016-03-14 DIAGNOSIS — R7309 Other abnormal glucose: Secondary | ICD-10-CM | POA: Diagnosis not present

## 2016-03-14 DIAGNOSIS — E7800 Pure hypercholesterolemia, unspecified: Secondary | ICD-10-CM | POA: Diagnosis not present

## 2016-03-15 ENCOUNTER — Other Ambulatory Visit: Payer: Self-pay

## 2016-03-23 ENCOUNTER — Telehealth: Payer: Self-pay

## 2016-03-23 NOTE — Telephone Encounter (Signed)
Called patient with no response. Notification letter has been sent.  Patient has [employer]. Referral is in [place] has been updated.   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

## 2016-03-23 NOTE — Telephone Encounter (Signed)
Barrett's Esophagus with Dysplasia

## 2016-03-29 NOTE — Telephone Encounter (Signed)
Patient needs a barrett's esophagus procedure. Neither Dr. Allen Norris nor Dr. Vicente Males performs these procedures. I spoke with the patients wife and she understood. They will contact their PCP to get a referral for [facility]

## 2016-04-10 DIAGNOSIS — K22719 Barrett's esophagus with dysplasia, unspecified: Secondary | ICD-10-CM | POA: Diagnosis not present

## 2016-05-11 DIAGNOSIS — I10 Essential (primary) hypertension: Secondary | ICD-10-CM | POA: Diagnosis not present

## 2016-05-11 DIAGNOSIS — E7800 Pure hypercholesterolemia, unspecified: Secondary | ICD-10-CM | POA: Diagnosis not present

## 2016-05-11 DIAGNOSIS — G43519 Persistent migraine aura without cerebral infarction, intractable, without status migrainosus: Secondary | ICD-10-CM | POA: Diagnosis not present

## 2016-05-11 DIAGNOSIS — I2510 Atherosclerotic heart disease of native coronary artery without angina pectoris: Secondary | ICD-10-CM | POA: Diagnosis not present

## 2016-05-16 DIAGNOSIS — M7541 Impingement syndrome of right shoulder: Secondary | ICD-10-CM | POA: Diagnosis not present

## 2016-05-16 DIAGNOSIS — M7542 Impingement syndrome of left shoulder: Secondary | ICD-10-CM | POA: Diagnosis not present

## 2017-07-25 ENCOUNTER — Encounter
Admission: RE | Disposition: A | Discharge status: Home / Self Care | Payer: Self-pay | Source: Ambulatory Visit | Attending: Cardiology

## 2017-07-25 ENCOUNTER — Ambulatory Visit
Admission: RE | Admit: 2017-07-25 | Discharge: 2017-07-25 | Disposition: A | Discharge status: Home / Self Care | Payer: Medicare PPO | Source: Ambulatory Visit | Attending: Cardiology | Admitting: Cardiology

## 2017-07-25 ENCOUNTER — Encounter: Payer: Self-pay | Admitting: Emergency Medicine

## 2017-07-25 DIAGNOSIS — Z7902 Long term (current) use of antithrombotics/antiplatelets: Secondary | ICD-10-CM | POA: Insufficient documentation

## 2017-07-25 DIAGNOSIS — Z8249 Family history of ischemic heart disease and other diseases of the circulatory system: Secondary | ICD-10-CM | POA: Diagnosis not present

## 2017-07-25 DIAGNOSIS — I2510 Atherosclerotic heart disease of native coronary artery without angina pectoris: Secondary | ICD-10-CM | POA: Insufficient documentation

## 2017-07-25 DIAGNOSIS — Z87891 Personal history of nicotine dependence: Secondary | ICD-10-CM | POA: Diagnosis not present

## 2017-07-25 DIAGNOSIS — Z7982 Long term (current) use of aspirin: Secondary | ICD-10-CM | POA: Diagnosis not present

## 2017-07-25 DIAGNOSIS — E785 Hyperlipidemia, unspecified: Secondary | ICD-10-CM | POA: Diagnosis not present

## 2017-07-25 DIAGNOSIS — K2270 Barrett's esophagus without dysplasia: Secondary | ICD-10-CM | POA: Diagnosis not present

## 2017-07-25 DIAGNOSIS — Z86010 Personal history of colonic polyps: Secondary | ICD-10-CM | POA: Diagnosis not present

## 2017-07-25 DIAGNOSIS — E039 Hypothyroidism, unspecified: Secondary | ICD-10-CM | POA: Insufficient documentation

## 2017-07-25 DIAGNOSIS — R079 Chest pain, unspecified: Secondary | ICD-10-CM | POA: Diagnosis present

## 2017-07-25 DIAGNOSIS — Z79899 Other long term (current) drug therapy: Secondary | ICD-10-CM | POA: Diagnosis not present

## 2017-07-25 DIAGNOSIS — Z955 Presence of coronary angioplasty implant and graft: Secondary | ICD-10-CM | POA: Insufficient documentation

## 2017-07-25 DIAGNOSIS — Z836 Family history of other diseases of the respiratory system: Secondary | ICD-10-CM | POA: Diagnosis not present

## 2017-07-25 DIAGNOSIS — K219 Gastro-esophageal reflux disease without esophagitis: Secondary | ICD-10-CM | POA: Diagnosis not present

## 2017-07-25 DIAGNOSIS — E7800 Pure hypercholesterolemia, unspecified: Secondary | ICD-10-CM | POA: Insufficient documentation

## 2017-07-25 DIAGNOSIS — I10 Essential (primary) hypertension: Secondary | ICD-10-CM | POA: Diagnosis not present

## 2017-07-25 DIAGNOSIS — M1990 Unspecified osteoarthritis, unspecified site: Secondary | ICD-10-CM | POA: Diagnosis not present

## 2017-07-25 HISTORY — PX: LEFT HEART CATH AND CORONARY ANGIOGRAPHY: CATH118249

## 2017-07-25 SURGERY — LEFT HEART CATH AND CORONARY ANGIOGRAPHY
Anesthesia: Moderate Sedation

## 2017-07-25 MED ORDER — MIDAZOLAM HCL 2 MG/2ML IJ SOLN
INTRAMUSCULAR | Status: DC | PRN
  Administered 2017-07-25 (×2): 1 mg via INTRAVENOUS

## 2017-07-25 MED ORDER — SODIUM CHLORIDE 0.9% FLUSH: 3.0000 mL | INTRAVENOUS | Status: DC | PRN

## 2017-07-25 MED ORDER — HEPARIN (PORCINE) IN NACL 2-0.9 UNIT/ML-% IJ SOLN
INTRAMUSCULAR | Status: AC
  Filled 2017-07-25: qty 500

## 2017-07-25 MED ORDER — FENTANYL CITRATE (PF) 100 MCG/2ML IJ SOLN
INTRAMUSCULAR | Status: DC | PRN
  Administered 2017-07-25: 50 ug via INTRAVENOUS

## 2017-07-25 MED ORDER — SODIUM CHLORIDE 0.9 % IV SOLN: 250.0000 mL | INTRAVENOUS | Status: DC | PRN

## 2017-07-25 MED ORDER — POTASSIUM CHLORIDE CRYS ER 20 MEQ PO TBCR
EXTENDED_RELEASE_TABLET | ORAL | Status: AC
  Filled 2017-07-25: qty 1

## 2017-07-25 MED ORDER — MIDAZOLAM HCL 2 MG/2ML IJ SOLN
INTRAMUSCULAR | Status: AC
  Filled 2017-07-25: qty 2

## 2017-07-25 MED ORDER — SODIUM CHLORIDE 0.9% FLUSH: 3.0000 mL | Freq: Two times a day (BID) | INTRAVENOUS | Status: DC

## 2017-07-25 MED ORDER — FENTANYL CITRATE (PF) 100 MCG/2ML IJ SOLN
INTRAMUSCULAR | Status: AC
  Filled 2017-07-25: qty 2

## 2017-07-25 MED ORDER — SODIUM CHLORIDE 0.9 % IV SOLN
INTRAVENOUS | Status: DC
  Administered 2017-07-25: 08:00:00 via INTRAVENOUS

## 2017-07-25 MED ORDER — IOPAMIDOL (ISOVUE-300) INJECTION 61%
INTRAVENOUS | Status: DC | PRN
  Administered 2017-07-25: 75 mL via INTRA_ARTERIAL

## 2017-07-25 MED ORDER — ASPIRIN 81 MG PO CHEW: 81.0000 mg | CHEWABLE_TABLET | ORAL | Status: DC

## 2017-07-25 MED ORDER — SODIUM CHLORIDE 0.9 % WEIGHT BASED INFUSION: 1.0000 mL/kg/h | INTRAVENOUS | Status: DC

## 2017-07-25 MED ORDER — POTASSIUM CHLORIDE CRYS ER 20 MEQ PO TBCR
20.0000 meq | EXTENDED_RELEASE_TABLET | Freq: Once | ORAL | Status: AC
  Administered 2017-07-25: 20 meq via ORAL

## 2017-07-25 SURGICAL SUPPLY — 9 items
CATH INFINITI 5FR ANG PIGTAIL (CATHETERS) ×3 IMPLANT
CATH INFINITI 5FR JL4 (CATHETERS) ×3 IMPLANT
CATH INFINITI JR4 5F (CATHETERS) ×3 IMPLANT
KIT MANI 3VAL PERCEP (MISCELLANEOUS) ×3 IMPLANT
NEEDLE PERC 18GX7CM (NEEDLE) ×3 IMPLANT
PACK CARDIAC CATH (CUSTOM PROCEDURE TRAY) ×3 IMPLANT
SHEATH AVANTI 5FR X 11CM (SHEATH) ×3 IMPLANT
WIRE GUIDERIGHT .035X150 (WIRE) ×6 IMPLANT
WIRE HITORQ VERSACORE ST 145CM (WIRE) ×3 IMPLANT

## 2017-07-25 NOTE — H&P (Signed)
Chief Complaint: Chief Complaint  Patient presents with  . Chest Pain  pt called in to be seen---has been going on for 3 days dont know if my Barretts  . other  I have been doing my ususal chores with my problems  Date of Service: 07/24/2017 Date of Birth: 1941-02-01 PCP: Idelle Crouch, MD  History of Present Illness: Warren Ingram is a 77 y.o.male patient who has a history of coronary artery disease status post PCI in 2010. He has been on dual anti-platelet therapy since that time. Patient has begun noting more chest pain. It is somewhat atypical for angina and also atypical for his Barrett's. He is able to work fairly vigorously without any difficulty. He also then will have a burning discomfort in his chest. This can occur with laying flat or with activity. He has features of discomfort are different than his typical Barrett's chest pain. Electrocardiogram today reveals sinus rhythm at a rate of 64 with a PR interval of 160 ms, QRS duration of 80 ms with a QTC of 447 ms and QRS axis of 70 degrees. There is no ischemic changes. He has been compliant with his medications including long-acting nitrates, dual antiplatelet therapy, high intensity statin. Past Medical and Surgical History  Past Medical History Past Medical History:  Diagnosis Date  . Barrett's esophagus 03/10/2010  06/25/2014 long segment  . Benign neoplasm of colon, unspecified 08/21/2013  . Coronary atherosclerosis of native coronary artery 08/21/2013  . Diverticulitis of colon (without mention of hemorrhage)(562.11) 08/21/2013  . Esophageal reflux 08/21/2013  . Essential hypertension, benign 08/21/2013  . Hemorrhage of rectum and anus 08/21/2013  . Osteoarthrosis, unspecified whether generalized or localized, lower leg 08/21/2013  . Osteoarthrosis, unspecified whether generalized or localized, pelvic region and thigh 08/21/2013  . Personal history of colonic polyps 2007, 2011  +TA  . Pure hypercholesterolemia 08/21/2013  .  Unspecified hypothyroidism 08/21/2013   Past Surgical History He has a past surgical history that includes Arthroscopy Knee (Left); Vasectomy; Colonoscopy (12/04/2005,03/10/2010); upper endoscopy (12/04/2005, 03/10/2010); Stent Placement Intracranial Percutaneous; flexible sigmoidoscopy (04/30/1996); Colonoscopy (06/25/2014); egd (06/25/2014); EGD with BARRX (10/28/2015); Knee arthroscopy; and Vasectomy.   Medications and Allergies  Current Medications  Current Outpatient Medications  Medication Sig Dispense Refill  . aspirin 81 MG EC tablet Take 81 mg by mouth once daily.  Marland Kitchen atorvastatin (LIPITOR) 80 MG tablet Take 1 tablet (80 mg total) by mouth once daily. 30 tablet 0  . fluticasone (FLONASE) 50 mcg/actuation nasal spray USE 2 SPRAYS INTO BOTH NOSTRILS ONE TIME DAILY 48 g 1  . gemfibrozil (LOPID) 600 mg tablet TAKE 1 TABLET TWICE DAILY BEFORE MEALS 180 tablet 3  . isosorbide mononitrate (IMDUR) 60 MG ER tablet Take 1 tablet (60 mg total) by mouth 2 (two) times daily 60 tablet 11  . levothyroxine (SYNTHROID, LEVOTHROID) 150 MCG tablet Take 1 tablet (150 mcg total) by mouth once daily. Take on an empty stomach with a glass of water at least 30-60 minutes before breakfast. 90 tablet 3  . omega-3 fatty acids/fish oil 340-1,000 mg capsule Take 2 capsules by mouth 2 (two) times daily.  Marland Kitchen omeprazole (PRILOSEC) 20 MG DR capsule TAKE 1 CAPSULE EVERY DAY 90 capsule 3  . acetaminophen (TYLENOL) 500 mg capsule  . prasugrel (EFFIENT) 10 mg tablet Take 1 tablet (10 mg total) by mouth once daily. 90 tablet 3  . sildenafil (VIAGRA) 100 MG tablet Take 100 mg by mouth once daily as needed for Erectile Dysfunction.  No current facility-administered medications for this visit.   Allergies: Patient has no known allergies.  Social and Family History  Social History reports that he has quit smoking. He has a 20.00 pack-year smoking history. He uses smokeless tobacco. He reports that he drinks alcohol. He reports  that he does not use drugs.  Family History Family History  Problem Relation Age of Onset  . Myocardial Infarction (Heart attack) Mother  . Lung disease Father   Review of Systems  Review of Systems  Constitutional: Negative for chills, diaphoresis, fever, malaise/fatigue and weight loss.  HENT: Negative for congestion, ear discharge, hearing loss and tinnitus.  Eyes: Negative for blurred vision.  Respiratory: Negative for cough, hemoptysis, sputum production, shortness of breath and wheezing.  Cardiovascular: Positive for chest pain. Negative for palpitations, orthopnea, claudication, leg swelling and PND.  Gastrointestinal: Negative for abdominal pain, blood in stool, constipation, diarrhea, melena, nausea and vomiting.  Genitourinary: Negative for dysuria, frequency, hematuria and urgency.  Musculoskeletal: Negative for back pain, falls, joint pain and myalgias.  Skin: Negative for itching and rash.  Neurological: Negative for dizziness, tingling, focal weakness, loss of consciousness, weakness and headaches.  Endo/Heme/Allergies: Negative for polydipsia. Does not bruise/bleed easily.  Psychiatric/Behavioral: Negative for depression, memory loss and substance abuse. The patient is not nervous/anxious.    Physical Examination   Vitals: BP 168/80  Pulse 66  Resp 12  Ht 162.6 cm (5\' 4" )  Wt 87.3 kg (192 lb 6.4 oz)  BMI 33.03 kg/m  Ht:162.6 cm (5\' 4" ) Wt:87.3 kg (192 lb 6.4 oz) FVC:BSWH surface area is 1.99 meters squared. Body mass index is 33.03 kg/m.  Wt Readings from Last 3 Encounters:  07/24/17 87.3 kg (192 lb 6.4 oz)  05/09/17 85.7 kg (189 lb)  03/29/17 85.3 kg (188 lb)   BP Readings from Last 3 Encounters:  07/24/17 168/80  05/09/17 148/70  03/29/17 (!) 180/98  general: Caucasian male in no acute stress  LUNGS Breath Sounds: Normal Percussion: Normal  CARDIOVASCULAR JVP CV wave: no HJR: no Elevation at 90 degrees: None Carotid Pulse: normal pulsation  bilaterally Bruit: None Apex: apical impulse normal  Auscultation Rhythm: normal sinus rhythm S1: normal S2: normal Clicks: no Rub: no Murmurs: no murmurs  Gallop: None ABDOMEN Liver enlargement: no Pulsatile aorta: no Ascites: no Bruits: no  EXTREMITIES Clubbing: no Edema: trace to 1+ bilateral pedal edema Pulses: peripheral pulses symmetrical Femoral Bruits: no Amputation: no SKIN Rash: no Cyanosis: no Embolic phemonenon: no Bruising: no NEURO Alert and Oriented to person, place and time: yes Non focal: yes LABS Last 3 CBC results: Lab Results  Component Value Date  WBC 5.8 03/29/2017  WBC 5.1 09/25/2016  WBC 6.2 03/14/2016   Lab Results  Component Value Date  HGB 15.6 03/29/2017  HGB 15.7 09/25/2016  HGB 15.2 03/14/2016   Lab Results  Component Value Date  HCT 44.7 03/29/2017  HCT 45.4 09/25/2016  HCT 44.0 03/14/2016   Lab Results  Component Value Date  PLT 228 03/29/2017  PLT 230 09/25/2016  PLT 218 03/14/2016   Lab Results  Component Value Date  CREATININE 1.1 03/29/2017  BUN 16 03/29/2017  NA 140 03/29/2017  K 3.9 03/29/2017  CL 103 03/29/2017  CO2 27.0 03/29/2017   Lab Results  Component Value Date  HGBA1C 6.7 (H) 03/29/2017   Lab Results  Component Value Date  HDL 33.1 03/29/2017  HDL 31.3 09/25/2016  HDL 35.1 03/14/2016   Lab Results  Component Value Date  LDLCALC 127 03/29/2017  LDLCALC 127 09/25/2016  LDLCALC 126 03/14/2016   Lab Results  Component Value Date  TRIG 183 03/29/2017  TRIG 150 09/25/2016  TRIG 105 03/14/2016   Lab Results  Component Value Date  ALT 26 03/29/2017  AST 19 03/29/2017  ALKPHOS 111 (H) 03/29/2017   Lab Results  Component Value Date  TSH 2.499 03/29/2017     Assessment and Plan   77 y.o. male with  ICD-10-CM ICD-9-CM  1. Atherosclerosis of native coronary artery of native heart without angina pectoris-no evidence of ischemia present. Remains on aspirin as well as present well.  We will continue with this along with long-acting nitrates. Has had a functional study showing borderline inferior ischemia in the past. Symptoms have worsened. Will need to proceed with left heart cath to evaluate coronary anatomy given patient's risk for heart disease, progressive symptoms and make further recommendations after this is complete. I25.10 414.01  2. Essential hypertension, benign-blood pressure is controlled with current regimen. I10 401.1  3. Pure hypercholesterolemia-hyperlipidemia is controlled with aggressive treatment with atorvastatin at 80 mg daily as well as gemfibrozil and Omega 3 fatty acids LDL goal of less than 100. Most recent level is 127 E78.0 272.0   Return in about 1 month (around 08/21/2017).  These notes generated with voice recognition software. I apologize for typographical errors.  [name], MD    Pt seen and examined. No change from above.

## 2017-08-03 HISTORY — PX: CORONARY ARTERY BYPASS GRAFT: SHX141

## 2017-08-06 DIAGNOSIS — Z951 Presence of aortocoronary bypass graft: Secondary | ICD-10-CM

## 2017-08-06 HISTORY — PX: CORONARY ARTERY BYPASS GRAFT: SHX141

## 2017-08-06 HISTORY — DX: Presence of aortocoronary bypass graft: Z95.1

## 2017-09-05 ENCOUNTER — Other Ambulatory Visit: Payer: Self-pay | Admitting: Internal Medicine

## 2017-09-05 DIAGNOSIS — M79662 Pain in left lower leg: Secondary | ICD-10-CM

## 2017-09-10 ENCOUNTER — Ambulatory Visit
Admission: RE | Admit: 2017-09-10 | Discharge: 2017-09-10 | Disposition: A | Discharge status: Home / Self Care | Payer: Medicare PPO | Source: Ambulatory Visit | Attending: Internal Medicine | Admitting: Internal Medicine

## 2017-09-10 ENCOUNTER — Encounter (INDEPENDENT_AMBULATORY_CARE_PROVIDER_SITE_OTHER): Payer: Self-pay

## 2017-09-10 DIAGNOSIS — Z951 Presence of aortocoronary bypass graft: Secondary | ICD-10-CM | POA: Insufficient documentation

## 2017-09-10 DIAGNOSIS — M79662 Pain in left lower leg: Secondary | ICD-10-CM

## 2017-09-13 ENCOUNTER — Encounter: Payer: Medicare PPO | Attending: Surgery | Admitting: *Deleted

## 2017-09-13 ENCOUNTER — Encounter: Payer: Self-pay | Admitting: *Deleted

## 2017-09-13 VITALS — Ht 65.0 in | Wt 175.8 lb

## 2017-09-13 DIAGNOSIS — Z48812 Encounter for surgical aftercare following surgery on the circulatory system: Secondary | ICD-10-CM | POA: Diagnosis present

## 2017-09-13 DIAGNOSIS — Z951 Presence of aortocoronary bypass graft: Secondary | ICD-10-CM | POA: Insufficient documentation

## 2017-09-13 NOTE — Progress Notes (Signed)
Cardiac Individual Treatment Plan  Patient Details  Name: Warren Ingram MRN: [id number] Date of Birth: [birth year] Referring Provider:     Cardiac Rehab from 09/13/2017 in [facility] Cardiac and Pulmonary Rehab  Referring Provider  Ikonomidis      Initial Encounter Date:    Cardiac Rehab from 09/13/2017 in [facility] Cardiac and Pulmonary Rehab  Date  09/13/17  Referring Provider  Ikonomidis      Visit Diagnosis: S/P CABG x 4  Patient's Home Medications on Admission:  Current Outpatient Medications:  .  acetaminophen (TYLENOL) 500 MG tablet, Take 500 mg by mouth every 8 (eight) hours as needed for mild pain or headache., Disp: , Rfl:  .  amiodarone (PACERONE) 200 MG tablet, , Disp: , Rfl:  .  aspirin (ASPIRIN EC) 81 MG EC tablet, Take 81 mg by mouth daily. Swallow whole., Disp: , Rfl:  .  atorvastatin (LIPITOR) 80 MG tablet, Take 80 mg by mouth daily., Disp: , Rfl:  .  colchicine (COLCRYS) 0.6 MG tablet, , Disp: , Rfl:  .  ELIQUIS 5 MG TABS tablet, , Disp: , Rfl:  .  fluticasone (FLONASE) 50 MCG/ACT nasal spray, Place 2 sprays into both nostrils daily., Disp: , Rfl:  .  furosemide (LASIX) 40 MG tablet, , Disp: , Rfl:  .  levothyroxine (SYNTHROID, LEVOTHROID) 150 MCG tablet, Take 150 mcg by mouth daily before breakfast., Disp: , Rfl:  .  metoprolol tartrate (LOPRESSOR) 25 MG tablet, , Disp: , Rfl:  .  Omega-3 Fatty Acids (FISH OIL) 1000 MG CAPS, Take 2,000 mg by mouth daily. , Disp: , Rfl:  .  omeprazole (PRILOSEC) 20 MG capsule, Take 20 mg by mouth daily., Disp: , Rfl:  .  sildenafil (VIAGRA) 100 MG tablet, Take by mouth., Disp: , Rfl:  .  tamsulosin (FLOMAX) 0.4 MG CAPS capsule, , Disp: , Rfl:  .  calcium carbonate (TUMS - DOSED IN MG ELEMENTAL CALCIUM) 500 MG chewable tablet, Chew 2 tablets by mouth 2 (two) times daily as needed for indigestion or heartburn., Disp: , Rfl:  .  gemfibrozil (LOPID) 600 MG tablet, Take 600 mg by mouth 2 (two) times daily before a meal., Disp: , Rfl:  .   isosorbide mononitrate (IMDUR) 60 MG 24 hr tablet, Take 60 mg by mouth 2 (two) times daily., Disp: , Rfl:   Past Medical History: Past Medical History:  Diagnosis Date  . Arthritis   . Barrett esophagus   . Benign neoplasm of abdomen   . Benign neoplasm of colon   . Coronary artery disease   . GERD (gastroesophageal reflux disease)   . Hypercholesterolemia   . Hypothyroidism   . Stroke [facility])     Tobacco Use: Social History   Tobacco Use  Smoking Status Former Smoker  . Packs/day: 1.00  . Last attempt to quit: 1985  . Years since quitting: 34.2  Smokeless Tobacco Never Used    Labs: Recent Review Flowsheet Data    There is no flowsheet data to display.       Exercise Target Goals: Date: 09/13/17  Exercise Program Goal: Individual exercise prescription set using results from initial 6 min walk test and THRR while considering  patient's activity barriers and safety.   Exercise Prescription Goal: Initial exercise prescription builds to 30-45 minutes a day of aerobic activity, 2-3 days per week.  Home exercise guidelines will be given to patient during program as part of exercise prescription that the participant will acknowledge.  Activity Barriers &  Risk Stratification: Activity Barriers & Cardiac Risk Stratification - 09/13/17 1443      Activity Barriers & Cardiac Risk Stratification   Activity Barriers  None    Cardiac Risk Stratification  Moderate       6 Minute Walk: 6 Minute Walk    Row Name 09/13/17 1452         6 Minute Walk   Distance  1142 feet     Walk Time  6 minutes     # of Rest Breaks  0     MPH  2.16     METS  2.37     RPE  12     Perceived Dyspnea   1     VO2 Peak  8.3     Symptoms  No     Resting HR  61 bpm     Resting BP  134/80     Resting Oxygen Saturation   97 %     Exercise Oxygen Saturation  during 6 min walk  99 %     Max Ex. HR  93 bpm     Max Ex. BP  164/70     2 Minute Post BP  132/66        Oxygen Initial  Assessment:   Oxygen Re-Evaluation:   Oxygen Discharge (Final Oxygen Re-Evaluation):   Initial Exercise Prescription: Initial Exercise Prescription - 09/13/17 1400      Date of Initial Exercise RX and Referring Provider   Date  09/13/17    Referring Provider  Ikonomidis      Treadmill   MPH  1.8    Grade  0    Minutes  15    METs  2.5      Recumbant Bike   Level  3    RPM  60    Watts  10    Minutes  15    METs  2.5      Arm Ergometer   Level  1    RPM  40    Minutes  15    METs  2.5      T5 Nustep   Level  1    SPM  80    Minutes  15    METs  2.5      Prescription Details   Frequency (times per week)  3    Duration  Progress to 30 minutes of continuous aerobic without signs/symptoms of physical distress      Intensity   THRR 40-80% of Max Heartrate  95-128    Ratings of Perceived Exertion  11-13    Perceived Dyspnea  0-4      Resistance Training   Training Prescription  Yes    Weight  3 lb    Reps  10-15       Perform Capillary Blood Glucose checks as needed.  Exercise Prescription Changes: Exercise Prescription Changes    Row Name 09/13/17 1400             Response to Exercise   Blood Pressure (Admit)  134/80       Blood Pressure (Exercise)  164/70       Blood Pressure (Exit)  132/66       Heart Rate (Admit)  67 bpm       Heart Rate (Exercise)  93 bpm       Heart Rate (Exit)  66 bpm       Oxygen Saturation (Admit)  97 %  Rating of Perceived Exertion (Exercise)  12          Exercise Comments:   Exercise Goals and Review: Exercise Goals    Row Name 09/13/17 1452             Exercise Goals   Increase Physical Activity  Yes       Intervention  Provide advice, education, support and counseling about physical activity/exercise needs.;Develop an individualized exercise prescription for aerobic and resistive training based on initial evaluation findings, risk stratification, comorbidities and participant's personal goals.        Expected Outcomes  Short Term: Attend rehab on a regular basis to increase amount of physical activity.;Long Term: Add in home exercise to make exercise part of routine and to increase amount of physical activity.;Long Term: Exercising regularly at least 3-5 days a week.       Increase Strength and Stamina  Yes       Intervention  Provide advice, education, support and counseling about physical activity/exercise needs.;Develop an individualized exercise prescription for aerobic and resistive training based on initial evaluation findings, risk stratification, comorbidities and participant's personal goals.       Expected Outcomes  Short Term: Increase workloads from initial exercise prescription for resistance, speed, and METs.;Long Term: Improve cardiorespiratory fitness, muscular endurance and strength as measured by increased METs and functional capacity (6MWT);Short Term: Perform resistance training exercises routinely during rehab and add in resistance training at home       Able to understand and use rate of perceived exertion (RPE) scale  Yes       Intervention  Provide education and explanation on how to use RPE scale       Expected Outcomes  Short Term: Able to use RPE daily in rehab to express subjective intensity level;Long Term:  Able to use RPE to guide intensity level when exercising independently       Able to understand and use Dyspnea scale  Yes       Intervention  Provide education and explanation on how to use Dyspnea scale       Expected Outcomes  Short Term: Able to use Dyspnea scale daily in rehab to express subjective sense of shortness of breath during exertion;Long Term: Able to use Dyspnea scale to guide intensity level when exercising independently       Knowledge and understanding of Target Heart Rate Range (THRR)  Yes       Intervention  Provide education and explanation of THRR including how the numbers were predicted and where they are located for reference       Expected  Outcomes  Short Term: Able to state/look up THRR;Short Term: Able to use daily as guideline for intensity in rehab;Long Term: Able to use THRR to govern intensity when exercising independently       Able to check pulse independently  Yes       Intervention  Provide education and demonstration on how to check pulse in carotid and radial arteries.;Review the importance of being able to check your own pulse for safety during independent exercise       Expected Outcomes  Short Term: Able to explain why pulse checking is important during independent exercise;Long Term: Able to check pulse independently and accurately       Understanding of Exercise Prescription  Yes       Intervention  Provide education, explanation, and written materials on patient's individual exercise prescription       Expected Outcomes  Short Term: Able to explain program exercise prescription;Long Term: Able to explain home exercise prescription to exercise independently          Exercise Goals Re-Evaluation :   Discharge Exercise Prescription (Final Exercise Prescription Changes): Exercise Prescription Changes - 09/13/17 1400      Response to Exercise   Blood Pressure (Admit)  134/80    Blood Pressure (Exercise)  164/70    Blood Pressure (Exit)  132/66    Heart Rate (Admit)  67 bpm    Heart Rate (Exercise)  93 bpm    Heart Rate (Exit)  66 bpm    Oxygen Saturation (Admit)  97 %    Rating of Perceived Exertion (Exercise)  12       Nutrition:  Target Goals: Understanding of nutrition guidelines, daily intake of sodium <1552m, cholesterol <2019m calories 30% from fat and 7% or less from saturated fats, daily to have 5 or more servings of fruits and vegetables.  Biometrics: Pre Biometrics - 09/13/17 1452      Pre Biometrics   Height  _0  (1.651 m)    Weight  175 lb 12.8 oz (79.7 kg)    Waist Circumference  39 inches    Hip Circumference  39 inches    Waist to Hip Ratio  1 %    BMI (Calculated)  29.25     Single Leg Stand  12.51 seconds        Nutrition Therapy Plan and Nutrition Goals: Nutrition Therapy & Goals - 09/13/17 1434      Intervention Plan   Intervention  Prescribe, educate and counsel regarding individualized specific dietary modifications aiming towards targeted core components such as weight, hypertension, lipid management, diabetes, heart failure and other comorbidities.;Nutrition handout(s) given to patient.    Expected Outcomes  Short Term Goal: Understand basic principles of dietary content, such as calories, fat, sodium, cholesterol and nutrients.;Short Term Goal: A plan has been developed with personal nutrition goals set during dietitian appointment.;Long Term Goal: Adherence to prescribed nutrition plan.       Nutrition Assessments: Nutrition Assessments - 09/13/17 1434      MEDFICTS Scores   Pre Score  54       Nutrition Goals Re-Evaluation:   Nutrition Goals Discharge (Final Nutrition Goals Re-Evaluation):   Psychosocial: Target Goals: Acknowledge presence or absence of significant depression and/or stress, maximize coping skills, provide positive support system. Participant is able to verbalize types and ability to use techniques and skills needed for reducing stress and depression.   Initial Review & Psychosocial Screening: Initial Psych Review & Screening - 09/13/17 1434      Initial Review   Current issues with  Current Stress Concerns    Source of Stress Concerns  Unable to perform yard/household activities    Comments  He is ready to get back to his usual duties on his [place] acres. He is used to performing lots of yard work.       Family Dynamics   Good Support System?  Yes family      Barriers   Psychosocial barriers to participate in program  The patient should benefit from training in stress management and relaxation.;There are no identifiable barriers or psychosocial needs.      Screening Interventions   Interventions  Encouraged to  exercise;Provide feedback about the scores to participant;Program counselor consult;To provide support and resources with identified psychosocial needs    Expected Outcomes  Short Term goal: Utilizing psychosocial counselor, staff and physician to  assist with identification of specific Stressors or current issues interfering with healing process. Setting desired goal for each stressor or current issue identified.;Long Term Goal: Stressors or current issues are controlled or eliminated.;Short Term goal: Identification and review with participant of any Quality of Life or Depression concerns found by scoring the questionnaire.;Long Term goal: The participant improves quality of Life and PHQ9 Scores as seen by post scores and/or verbalization of changes       Quality of Life Scores:  Quality of Life - 09/13/17 1440      Quality of Life Scores   Health/Function Pre  26.8 %    Socioeconomic Pre  30 %    Psych/Spiritual Pre  30 %    Family Pre  28.8 %    GLOBAL Pre  28.36 %      Scores of 19 and below usually indicate a poorer quality of life in these areas.  A difference of  2-3 points is a clinically meaningful difference.  A difference of 2-3 points in the total score of the Quality of Life Index has been associated with significant improvement in overall quality of life, self-image, physical symptoms, and general health in studies assessing change in quality of life.  PHQ-9: Recent Review Flowsheet Data    Depression screen [facility] 2/9 09/13/2017   Decreased Interest 0   Down, Depressed, Hopeless 0   PHQ - 2 Score 0   Altered sleeping 3   Tired, decreased energy 2   Change in appetite 0   Feeling bad or failure about yourself  0   Trouble concentrating 0   Moving slowly or fidgety/restless 0   Suicidal thoughts 0   PHQ-9 Score 5   Difficult doing work/chores Not difficult at all     Interpretation of Total Score  Total Score Depression Severity:  1-4 = Minimal depression, 5-9 = Mild  depression, 10-14 = Moderate depression, 15-19 = Moderately severe depression, 20-27 = Severe depression   Psychosocial Evaluation and Intervention:   Psychosocial Re-Evaluation:   Psychosocial Discharge (Final Psychosocial Re-Evaluation):   Vocational Rehabilitation: Provide vocational rehab assistance to qualifying candidates.   Vocational Rehab Evaluation & Intervention: Vocational Rehab - 09/13/17 1442      Initial Vocational Rehab Evaluation & Intervention   Assessment shows need for Vocational Rehabilitation  No       Education: Education Goals: Education classes will be provided on a variety of topics geared toward better understanding of heart health and risk factor modification. Participant will state understanding/return demonstration of topics presented as noted by education test scores.  Learning Barriers/Preferences: Learning Barriers/Preferences - 09/13/17 1441      Learning Barriers/Preferences   Learning Barriers  Hearing    Learning Preferences  None       Education Topics:  AED/CPR: - Group verbal and written instruction with the use of models to demonstrate the basic use of the AED with the basic ABC's of resuscitation.   General Nutrition Guidelines/Fats and Fiber: -Group instruction provided by verbal, written material, models and posters to present the general guidelines for heart healthy nutrition. Gives an explanation and review of dietary fats and fiber.   Controlling Sodium/Reading Food Labels: -Group verbal and written material supporting the discussion of sodium use in heart healthy nutrition. Review and explanation with models, verbal and written materials for utilization of the food label.   Exercise Physiology & General Exercise Guidelines: - Group verbal and written instruction with models to review the exercise physiology of the  cardiovascular system and associated critical values. Provides general exercise guidelines with specific  guidelines to those with heart or lung disease.    Aerobic Exercise & Resistance Training: - Gives group verbal and written instruction on the various components of exercise. Focuses on aerobic and resistive training programs and the benefits of this training and how to safely progress through these programs..   Flexibility, Balance, Mind/Body Relaxation: Provides group verbal/written instruction on the benefits of flexibility and balance training, including mind/body exercise modes such as yoga, pilates and tai chi.  Demonstration and skill practice provided.   Stress and Anxiety: - Provides group verbal and written instruction about the health risks of elevated stress and causes of high stress.  Discuss the correlation between heart/lung disease and anxiety and treatment options. Review healthy ways to manage with stress and anxiety.   Depression: - Provides group verbal and written instruction on the correlation between heart/lung disease and depressed mood, treatment options, and the stigmas associated with seeking treatment.   Anatomy & Physiology of the Heart: - Group verbal and written instruction and models provide basic cardiac anatomy and physiology, with the coronary electrical and arterial systems. Review of Valvular disease and Heart Failure   Cardiac Procedures: - Group verbal and written instruction to review commonly prescribed medications for heart disease. Reviews the medication, class of the drug, and side effects. Includes the steps to properly store meds and maintain the prescription regimen. (beta blockers and nitrates)   Cardiac Medications I: - Group verbal and written instruction to review commonly prescribed medications for heart disease. Reviews the medication, class of the drug, and side effects. Includes the steps to properly store meds and maintain the prescription regimen.   Cardiac Medications II: -Group verbal and written instruction to review commonly  prescribed medications for heart disease. Reviews the medication, class of the drug, and side effects. (all other drug classes)    Go Sex-Intimacy & Heart Disease, Get SMART - Goal Setting: - Group verbal and written instruction through game format to discuss heart disease and the return to sexual intimacy. Provides group verbal and written material to discuss and apply goal setting through the application of the S.M.A.R.T. Method.   Other Matters of the Heart: - Provides group verbal, written materials and models to describe Stable Angina and Peripheral Artery. Includes description of the disease process and treatment options available to the cardiac patient.   Exercise & Equipment Safety: - Individual verbal instruction and demonstration of equipment use and safety with use of the equipment.   Cardiac Rehab from 09/13/2017 in [facility] Cardiac and Pulmonary Rehab  Date  09/13/17  Educator  [facility]  Instruction Review Code  1- Verbalizes Understanding      Infection Prevention: - Provides verbal and written material to individual with discussion of infection control including proper hand washing and proper equipment cleaning during exercise session.   Cardiac Rehab from 09/13/2017 in [facility] Cardiac and Pulmonary Rehab  Date  09/13/17  Educator  [facility]  Instruction Review Code  1- Verbalizes Understanding      Falls Prevention: - Provides verbal and written material to individual with discussion of falls prevention and safety.   Cardiac Rehab from 09/13/2017 in [facility] Cardiac and Pulmonary Rehab  Date  09/13/17  Educator  [facility]  Instruction Review Code  1- Verbalizes Understanding      Diabetes: - Individual verbal and written instruction to review signs/symptoms of diabetes, desired ranges of glucose level fasting, after meals and with exercise. Acknowledge  that pre and post exercise glucose checks will be done for 3 sessions at entry of program.   Know Your Numbers and Risk Factors: -Group  verbal and written instruction about important numbers in your health.  Discussion of what are risk factors and how they play a role in the disease process.  Review of Cholesterol, Blood Pressure, Diabetes, and BMI and the role they play in your overall health.   Sleep Hygiene: -Provides group verbal and written instruction about how sleep can affect your health.  Define sleep hygiene, discuss sleep cycles and impact of sleep habits. Review good sleep hygiene tips.    Other: -Provides group and verbal instruction on various topics (see comments)   Knowledge Questionnaire Score: Knowledge Questionnaire Score - 09/13/17 1441      Knowledge Questionnaire Score   Pre Score  23/28 correct answers reviewed with Hollice Espy        Core Components/Risk Factors/Patient Goals at Admission: Personal Goals and Risk Factors at Admission - 09/13/17 1433      Core Components/Risk Factors/Patient Goals on Admission    Weight Management  Yes;Weight Maintenance    Intervention  Weight Management: Develop a combined nutrition and exercise program designed to reach desired caloric intake, while maintaining appropriate intake of nutrient and fiber, sodium and fats, and appropriate energy expenditure required for the weight goal.;Weight Management: Provide education and appropriate resources to help participant work on and attain dietary goals.    Admit Weight  175 lb (79.4 kg)    Expected Outcomes  Short Term: Continue to assess and modify interventions until short term weight is achieved;Long Term: Adherence to nutrition and physical activity/exercise program aimed toward attainment of established weight goal;Weight Maintenance: Understanding of the daily nutrition guidelines, which includes 25-35% calories from fat, 7% or less cal from saturated fats, less than 242m cholesterol, less than 1.5gm of sodium, & 5 or more servings of fruits and vegetables daily;Understanding recommendations for meals to include  15-35% energy as protein, 25-35% energy from fat, 35-60% energy from carbohydrates, less than 20103mof dietary cholesterol, 20-35 gm of total fiber daily;Understanding of distribution of calorie intake throughout the day with the consumption of 4-5 meals/snacks    Hypertension  Yes    Intervention  Provide education on lifestyle modifcations including regular physical activity/exercise, weight management, moderate sodium restriction and increased consumption of fresh fruit, vegetables, and low fat dairy, alcohol moderation, and smoking cessation.;Monitor prescription use compliance.    Expected Outcomes  Short Term: Continued assessment and intervention until BP is < 140/9011mG in hypertensive participants. < 130/32m33m in hypertensive participants with diabetes, heart failure or chronic kidney disease.;Long Term: Maintenance of blood pressure at goal levels.    Lipids  Yes    Intervention  Provide education and support for participant on nutrition & aerobic/resistive exercise along with prescribed medications to achieve LDL <70mg72mL >40mg.34mExpected Outcomes  Short Term: Participant states understanding of desired cholesterol values and is compliant with medications prescribed. Participant is following exercise prescription and nutrition guidelines.;Long Term: Cholesterol controlled with medications as prescribed, with individualized exercise RX and with personalized nutrition plan. Value goals: LDL < 70mg, 73m> 40 mg.       Core Components/Risk Factors/Patient Goals Review:    Core Components/Risk Factors/Patient Goals at Discharge (Final Review):    ITP Comments: ITP Comments    Row Name 09/13/17 1421           ITP Comments  Med Review completed.  Initial ITP created. Diagnosis can be found in Care Everywhere 08/06/17          Comments: Initial ITP

## 2017-09-13 NOTE — Patient Instructions (Signed)
Patient Instructions  Patient Details  Name: Warren Ingram MRN: [id number] Date of Birth: 27-Oct-1940 Referring Provider:  Concepcion Living, MD  Below are your personal goals for exercise, nutrition, and risk factors. Our goal is to help you stay on track towards obtaining and maintaining these goals. We will be discussing your progress on these goals with you throughout the program.  Initial Exercise Prescription:   Exercise Goals: Frequency: Be able to perform aerobic exercise two to three times per week in program working toward 2-5 days per week of home exercise.  Intensity: Work with a perceived exertion of 11 (fairly light) - 15 (hard) while following your exercise prescription.  We will make changes to your prescription with you as you progress through the program.   Duration: Be able to do 30 to 45 minutes of continuous aerobic exercise in addition to a 5 minute warm-up and a 5 minute cool-down routine.   Nutrition Goals: Your personal nutrition goals will be established when you do your nutrition analysis with the dietician.  The following are general nutrition guidelines to follow: Cholesterol < 200mg /day Sodium < 1500mg /day Fiber: Men over 50 yrs - 30 grams per day  Personal Goals: Personal Goals and Risk Factors at Admission - 09/13/17 1433      Core Components/Risk Factors/Patient Goals on Admission    Weight Management  Yes;Weight Maintenance    Intervention  Weight Management: Develop a combined nutrition and exercise program designed to reach desired caloric intake, while maintaining appropriate intake of nutrient and fiber, sodium and fats, and appropriate energy expenditure required for the weight goal.;Weight Management: Provide education and appropriate resources to help participant work on and attain dietary goals.    Admit Weight  175 lb (79.4 kg)    Expected Outcomes  Short Term: Continue to assess and modify interventions until short term weight is  achieved;Long Term: Adherence to nutrition and physical activity/exercise program aimed toward attainment of established weight goal;Weight Maintenance: Understanding of the daily nutrition guidelines, which includes 25-35% calories from fat, 7% or less cal from saturated fats, less than 200mg  cholesterol, less than 1.5gm of sodium, & 5 or more servings of fruits and vegetables daily;Understanding recommendations for meals to include 15-35% energy as protein, 25-35% energy from fat, 35-60% energy from carbohydrates, less than 200mg  of dietary cholesterol, 20-35 gm of total fiber daily;Understanding of distribution of calorie intake throughout the day with the consumption of 4-5 meals/snacks    Hypertension  Yes    Intervention  Provide education on lifestyle modifcations including regular physical activity/exercise, weight management, moderate sodium restriction and increased consumption of fresh fruit, vegetables, and low fat dairy, alcohol moderation, and smoking cessation.;Monitor prescription use compliance.    Expected Outcomes  Short Term: Continued assessment and intervention until BP is < 140/28mm HG in hypertensive participants. < 130/31mm HG in hypertensive participants with diabetes, heart failure or chronic kidney disease.;Long Term: Maintenance of blood pressure at goal levels.    Lipids  Yes    Intervention  Provide education and support for participant on nutrition & aerobic/resistive exercise along with prescribed medications to achieve LDL 70mg , HDL >40mg .    Expected Outcomes  Short Term: Participant states understanding of desired cholesterol values and is compliant with medications prescribed. Participant is following exercise prescription and nutrition guidelines.;Long Term: Cholesterol controlled with medications as prescribed, with individualized exercise RX and with personalized nutrition plan. Value goals: LDL < 70mg , HDL > 40 mg.       Tobacco Use Initial  Evaluation: Social  History   Tobacco Use  Smoking Status Former Smoker  . Packs/day: 1.00  . Last attempt to quit: 1985  . Years since quitting: 34.2  Smokeless Tobacco Never Used    Exercise Goals and Review: Exercise Goals    Row Name 09/13/17 1452             Exercise Goals   Increase Physical Activity  Yes       Intervention  Provide advice, education, support and counseling about physical activity/exercise needs.;Develop an individualized exercise prescription for aerobic and resistive training based on initial evaluation findings, risk stratification, comorbidities and participant's personal goals.       Expected Outcomes  Short Term: Attend rehab on a regular basis to increase amount of physical activity.;Long Term: Add in home exercise to make exercise part of routine and to increase amount of physical activity.;Long Term: Exercising regularly at least 3-5 days a week.       Increase Strength and Stamina  Yes       Intervention  Provide advice, education, support and counseling about physical activity/exercise needs.;Develop an individualized exercise prescription for aerobic and resistive training based on initial evaluation findings, risk stratification, comorbidities and participant's personal goals.       Expected Outcomes  Short Term: Increase workloads from initial exercise prescription for resistance, speed, and METs.;Long Term: Improve cardiorespiratory fitness, muscular endurance and strength as measured by increased METs and functional capacity (6MWT);Short Term: Perform resistance training exercises routinely during rehab and add in resistance training at home       Able to understand and use rate of perceived exertion (RPE) scale  Yes       Intervention  Provide education and explanation on how to use RPE scale       Expected Outcomes  Short Term: Able to use RPE daily in rehab to express subjective intensity level;Long Term:  Able to use RPE to guide intensity level when exercising  independently       Able to understand and use Dyspnea scale  Yes       Intervention  Provide education and explanation on how to use Dyspnea scale       Expected Outcomes  Short Term: Able to use Dyspnea scale daily in rehab to express subjective sense of shortness of breath during exertion;Long Term: Able to use Dyspnea scale to guide intensity level when exercising independently       Knowledge and understanding of Target Heart Rate Range (THRR)  Yes       Intervention  Provide education and explanation of THRR including how the numbers were predicted and where they are located for reference       Expected Outcomes  Short Term: Able to state/look up THRR;Short Term: Able to use daily as guideline for intensity in rehab;Long Term: Able to use THRR to govern intensity when exercising independently       Able to check pulse independently  Yes       Intervention  Provide education and demonstration on how to check pulse in carotid and radial arteries.;Review the importance of being able to check your own pulse for safety during independent exercise       Expected Outcomes  Short Term: Able to explain why pulse checking is important during independent exercise;Long Term: Able to check pulse independently and accurately       Understanding of Exercise Prescription  Yes       Intervention  Provide education, explanation,  and written materials on patient's individual exercise prescription       Expected Outcomes  Short Term: Able to explain program exercise prescription;Long Term: Able to explain home exercise prescription to exercise independently          Copy of goals given to participant.

## 2017-09-13 NOTE — Progress Notes (Signed)
Daily Session Note  Patient Details  Name: Warren Ingram MRN: [id number] Date of Birth: [birth year] Referring Provider:     Cardiac Rehab from 09/13/2017 in [facility] Cardiac and Pulmonary Rehab  Referring Provider  Ikonomidis      Encounter Date: 09/13/2017  Check In: Session Check In - 09/13/17 1414      Check-In   Location  [facility]-Cardiac & Pulmonary Rehab    Staff Present  Renita Papa, RN Vickki Hearing, BA, ACSM CEP, Exercise Physiologist    Supervising physician immediately available to respond to emergencies  See telemetry face sheet for immediately available ER MD    Medication changes reported      No    Fall or balance concerns reported     No    Tobacco Cessation  No Change quit in [place] and Cool-down  Performed as group-led instruction    Resistance Training Performed  Yes    VAD Patient?  No      Pain Assessment   Currently in Pain?  No/denies        Exercise Prescription Changes - 09/13/17 1400      Response to Exercise   Blood Pressure (Admit)  134/80    Blood Pressure (Exercise)  164/70    Blood Pressure (Exit)  132/66    Heart Rate (Admit)  67 bpm    Heart Rate (Exercise)  93 bpm    Heart Rate (Exit)  66 bpm    Oxygen Saturation (Admit)  97 %    Rating of Perceived Exertion (Exercise)  12       Social History   Tobacco Use  Smoking Status Former Smoker  . Packs/day: 1.00  . Last attempt to quit: 1985  . Years since quitting: 34.2  Smokeless Tobacco Never Used    Goals Met:  Proper associated with RPD/PD & O2 Sat Exercise tolerated well No report of cardiac concerns or symptoms Strength training completed today  Goals Unmet:  Not Applicable  Comments: Med Review completed   Dr. Emily Filbert is Medical Director for [place] and [facility].

## 2017-09-18 ENCOUNTER — Encounter: Payer: Medicare PPO | Admitting: *Deleted

## 2017-09-18 DIAGNOSIS — Z48812 Encounter for surgical aftercare following surgery on the circulatory system: Secondary | ICD-10-CM | POA: Diagnosis not present

## 2017-09-18 DIAGNOSIS — Z951 Presence of aortocoronary bypass graft: Secondary | ICD-10-CM

## 2017-09-18 NOTE — Patient Instructions (Signed)
Patient Instructions  Patient Details  Name: Warren Ingram MRN: [id number] Date of Birth: [birth year] Referring Provider:  Idelle Crouch, MD  Below are your personal goals for exercise, nutrition, and risk factors. Our goal is to help you stay on track towards obtaining and maintaining these goals. We will be discussing your progress on these goals with you throughout the program.  Initial Exercise Prescription: Initial Exercise Prescription - 09/13/17 1400      Date of Initial Exercise RX and Referring Provider   Date  09/13/17    Referring Provider  Ikonomidis      Treadmill   MPH  1.8    Grade  0    Minutes  15    METs  2.5      Recumbant Bike   Level  3    RPM  60    Watts  10    Minutes  15    METs  2.5      Arm Ergometer   Level  1    RPM  40    Minutes  15    METs  2.5      T5 Nustep   Level  1    SPM  80    Minutes  15    METs  2.5      Prescription Details   Frequency (times per week)  3    Duration  Progress to 30 minutes of continuous aerobic without signs/symptoms of physical distress      Intensity   THRR 40-80% of Max Heartrate  95-128    Ratings of Perceived Exertion  11-13    Perceived Dyspnea  0-4      Resistance Training   Training Prescription  Yes    Weight  3 lb    Reps  10-15       Exercise Goals: Frequency: Be able to perform aerobic exercise two to three times per week in program working toward 2-5 days per week of home exercise.  Intensity: Work with a perceived exertion of 11 (fairly light) - 15 (hard) while following your exercise prescription.  We will make changes to your prescription with you as you progress through the program.   Duration: Be able to do 30 to 45 minutes of continuous aerobic exercise in addition to a 5 minute warm-up and a 5 minute cool-down routine.   Nutrition Goals: Your personal nutrition goals will be established when you do your nutrition analysis with the dietician.  The following are  general nutrition guidelines to follow: Cholesterol < 200mg /day Sodium < 1500mg /day Fiber: Men over 50 yrs - 30 grams per day  Personal Goals: Personal Goals and Risk Factors at Admission - 09/13/17 1433      Core Components/Risk Factors/Patient Goals on Admission    Weight Management  Yes;Weight Maintenance    Intervention  Weight Management: Develop a combined nutrition and exercise program designed to reach desired caloric intake, while maintaining appropriate intake of nutrient and fiber, sodium and fats, and appropriate energy expenditure required for the weight goal.;Weight Management: Provide education and appropriate resources to help participant work on and attain dietary goals.    Admit Weight  175 lb (79.4 kg)    Expected Outcomes  Short Term: Continue to assess and modify interventions until short term weight is achieved;Long Term: Adherence to nutrition and physical activity/exercise program aimed toward attainment of established weight goal;Weight Maintenance: Understanding of the daily nutrition guidelines, which includes 25-35% calories from fat, 7%  or less cal from saturated fats, less than 200mg  cholesterol, less than 1.5gm of sodium, & 5 or more servings of fruits and vegetables daily;Understanding recommendations for meals to include 15-35% energy as protein, 25-35% energy from fat, 35-60% energy from carbohydrates, less than 200mg  of dietary cholesterol, 20-35 gm of total fiber daily;Understanding of distribution of calorie intake throughout the day with the consumption of 4-5 meals/snacks    Hypertension  Yes    Intervention  Provide education on lifestyle modifcations including regular physical activity/exercise, weight management, moderate sodium restriction and increased consumption of fresh fruit, vegetables, and low fat dairy, alcohol moderation, and smoking cessation.;Monitor prescription use compliance.    Expected Outcomes  Short Term: Continued assessment and  intervention until BP is < 140/22mm HG in hypertensive participants. < 130/20mm HG in hypertensive participants with diabetes, heart failure or chronic kidney disease.;Long Term: Maintenance of blood pressure at goal levels.    Lipids  Yes    Intervention  Provide education and support for participant on nutrition & aerobic/resistive exercise along with prescribed medications to achieve LDL 70mg , HDL >40mg .    Expected Outcomes  Short Term: Participant states understanding of desired cholesterol values and is compliant with medications prescribed. Participant is following exercise prescription and nutrition guidelines.;Long Term: Cholesterol controlled with medications as prescribed, with individualized exercise RX and with personalized nutrition plan. Value goals: LDL < 70mg , HDL > 40 mg.       Tobacco Use Initial Evaluation: Social History   Tobacco Use  Smoking Status Former Smoker  . Packs/day: 1.00  . Last attempt to quit: 1985  . Years since quitting: 34.3  Smokeless Tobacco Never Used    Exercise Goals and Review: Exercise Goals    Row Name 09/13/17 1452             Exercise Goals   Increase Physical Activity  Yes       Intervention  Provide advice, education, support and counseling about physical activity/exercise needs.;Develop an individualized exercise prescription for aerobic and resistive training based on initial evaluation findings, risk stratification, comorbidities and participant's personal goals.       Expected Outcomes  Short Term: Attend rehab on a regular basis to increase amount of physical activity.;Long Term: Add in home exercise to make exercise part of routine and to increase amount of physical activity.;Long Term: Exercising regularly at least 3-5 days a week.       Increase Strength and Stamina  Yes       Intervention  Provide advice, education, support and counseling about physical activity/exercise needs.;Develop an individualized exercise prescription  for aerobic and resistive training based on initial evaluation findings, risk stratification, comorbidities and participant's personal goals.       Expected Outcomes  Short Term: Increase workloads from initial exercise prescription for resistance, speed, and METs.;Long Term: Improve cardiorespiratory fitness, muscular endurance and strength as measured by increased METs and functional capacity (6MWT);Short Term: Perform resistance training exercises routinely during rehab and add in resistance training at home       Able to understand and use rate of perceived exertion (RPE) scale  Yes       Intervention  Provide education and explanation on how to use RPE scale       Expected Outcomes  Short Term: Able to use RPE daily in rehab to express subjective intensity level;Long Term:  Able to use RPE to guide intensity level when exercising independently       Able to understand and  use Dyspnea scale  Yes       Intervention  Provide education and explanation on how to use Dyspnea scale       Expected Outcomes  Short Term: Able to use Dyspnea scale daily in rehab to express subjective sense of shortness of breath during exertion;Long Term: Able to use Dyspnea scale to guide intensity level when exercising independently       Knowledge and understanding of Target Heart Rate Range (THRR)  Yes       Intervention  Provide education and explanation of THRR including how the numbers were predicted and where they are located for reference       Expected Outcomes  Short Term: Able to state/look up THRR;Short Term: Able to use daily as guideline for intensity in rehab;Long Term: Able to use THRR to govern intensity when exercising independently       Able to check pulse independently  Yes       Intervention  Provide education and demonstration on how to check pulse in carotid and radial arteries.;Review the importance of being able to check your own pulse for safety during independent exercise       Expected Outcomes   Short Term: Able to explain why pulse checking is important during independent exercise;Long Term: Able to check pulse independently and accurately       Understanding of Exercise Prescription  Yes       Intervention  Provide education, explanation, and written materials on patient's individual exercise prescription       Expected Outcomes  Short Term: Able to explain program exercise prescription;Long Term: Able to explain home exercise prescription to exercise independently          Copy of goals given to participant.

## 2017-09-18 NOTE — Progress Notes (Signed)
Daily Session Note  Patient Details  Name: NASON CONRADT MRN: [id number] Date of Birth: [birth year] Referring Provider:     Cardiac Rehab from 09/13/2017 in [facility] Cardiac and Pulmonary Rehab  Referring Provider  Ikonomidis      Encounter Date: 09/18/2017  Check In: Session Check In - 09/18/17 1018      Check-In   Location  [facility]-Cardiac & Pulmonary Rehab    Staff Present  Heath Lark, RN, BSN, CCRP;Lashica Hannay [place], MA, RCEP, CCRP, Exercise Physiologist;Amanda Oletta Darter, [place], ACSM CEP, Exercise Physiologist    Supervising physician immediately available to respond to emergencies  See telemetry face sheet for immediately available ER MD    Medication changes reported      No    Fall or balance concerns reported     No    Warm-up and Cool-down  Performed on first and last piece of equipment    Resistance Training Performed  Yes    VAD Patient?  No      Pain Assessment   Currently in Pain?  No/denies          Social History   Tobacco Use  Smoking Status Former Smoker  . Packs/day: 1.00  . Last attempt to quit: 1985  . Years since quitting: 34.3  Smokeless Tobacco Never Used    Goals Met:  Exercise tolerated well Personal goals reviewed No report of cardiac concerns or symptoms Strength training completed today  Goals Unmet:  Not Applicable  Comments: First full day of exercise!  Patient was oriented to gym and equipment including functions, settings, policies, and procedures.  Patient's individual exercise prescription and treatment plan were reviewed.  All starting workloads were established based on the results of the 6 minute walk test done at initial orientation visit.  The plan for exercise progression was also introduced and progression will be customized based on patient's performance and goals.    Dr. Emily Filbert is Medical Director for [place] and [facility].

## 2017-09-20 DIAGNOSIS — Z951 Presence of aortocoronary bypass graft: Secondary | ICD-10-CM

## 2017-09-20 DIAGNOSIS — Z48812 Encounter for surgical aftercare following surgery on the circulatory system: Secondary | ICD-10-CM | POA: Diagnosis not present

## 2017-09-20 NOTE — Progress Notes (Signed)
Daily Session Note  Patient Details  Name: Warren Ingram MRN: [id number] Date of Birth: Aug 29, 1940 Referring Provider:     Cardiac Rehab from 09/13/2017 in [facility] Cardiac and Pulmonary Rehab  Referring Provider  Ikonomidis      Encounter Date: 09/20/2017  Check In: Session Check In - 09/20/17 0904      Check-In   Location  [facility]-Cardiac & Pulmonary Rehab    Staff Present  Alberteen Sam, MA, RCEP, CCRP, Exercise Physiologist;Rashi Granier Oletta Darter, BA, ACSM CEP, Exercise Physiologist;Carroll Enterkin, RN, BSN    Supervising physician immediately available to respond to emergencies  See telemetry face sheet for immediately available ER MD    Medication changes reported      No    Fall or balance concerns reported     No    Warm-up and Cool-down  Performed on first and last piece of equipment    Resistance Training Performed  Yes    VAD Patient?  No      Pain Assessment   Currently in Pain?  No/denies    Multiple Pain Sites  No        Exercise Prescription Changes - 09/20/17 0900      Home Exercise Plan   Plans to continue exercise at  [facility] walk at home    Frequency  Add 2 additional days to program exercise sessions.    Initial Home Exercises Provided  09/20/17       Social History   Tobacco Use  Smoking Status Former Smoker  . Packs/day: 1.00  . Last attempt to quit: 1985  . Years since quitting: 34.3  Smokeless Tobacco Never Used    Goals Met:  Independence with exercise equipment Exercise tolerated well No report of cardiac concerns or symptoms Strength training completed today  Goals Unmet:  Not Applicable  Comments: Reviewed home exercise with pt today.  Pt plans to attend [facility] and walk at home for exercise.  Reviewed THR, pulse, RPE, sign and symptoms, NTG use, and when to call 911 or MD.  Also discussed weather considerations and indoor options.  Pt voiced understanding.    Dr. Emily Filbert is Medical Director for [facility] and [facility].

## 2017-09-25 ENCOUNTER — Encounter: Payer: Medicare PPO | Admitting: *Deleted

## 2017-09-25 DIAGNOSIS — Z48812 Encounter for surgical aftercare following surgery on the circulatory system: Secondary | ICD-10-CM | POA: Diagnosis not present

## 2017-09-25 DIAGNOSIS — Z951 Presence of aortocoronary bypass graft: Secondary | ICD-10-CM

## 2017-09-25 NOTE — Progress Notes (Signed)
Daily Session Note  Patient Details  Name: Warren Ingram MRN: [id number] Date of Birth: [birth year] Referring Provider:     Cardiac Rehab from 09/13/2017 in [facility] Cardiac and Pulmonary Rehab  Referring Provider  Ikonomidis      Encounter Date: 09/25/2017  Check In: Session Check In - 09/25/17 0828      Check-In   Location  [facility]-Cardiac & Pulmonary Rehab    Staff Present  Heath Lark, RN, BSN, CCRP;Jaishaun Mcnab [place], MA, RCEP, CCRP, Exercise Physiologist;Amanda Oletta Darter, [place], ACSM CEP, Exercise Physiologist    Supervising physician immediately available to respond to emergencies  See telemetry face sheet for immediately available ER MD    Medication changes reported      No    Fall or balance concerns reported     No    Warm-up and Cool-down  Performed on first and last piece of equipment    Resistance Training Performed  Yes    VAD Patient?  No      Pain Assessment   Currently in Pain?  No/denies          Social History   Tobacco Use  Smoking Status Former Smoker  . Packs/day: 1.00  . Last attempt to quit: 1985  . Years since quitting: 34.3  Smokeless Tobacco Never Used    Goals Met:  Independence with exercise equipment Exercise tolerated well No report of cardiac concerns or symptoms Strength training completed today  Goals Unmet:  Not Applicable  Comments: Pt able to follow exercise prescription today without complaint.  Will continue to monitor for progression.    Dr. Emily Filbert is Medical Director for [place] and [facility].

## 2017-09-26 ENCOUNTER — Encounter: Payer: Self-pay | Admitting: *Deleted

## 2017-09-26 DIAGNOSIS — Z951 Presence of aortocoronary bypass graft: Secondary | ICD-10-CM

## 2017-09-26 NOTE — Progress Notes (Signed)
Cardiac Individual Treatment Plan  Patient Details  Name: Warren Ingram MRN: [id number] Date of Birth: 07-29-1940 Referring Provider:     Cardiac Rehab from 09/13/2017 in [facility] Cardiac and Pulmonary Rehab  Referring Provider  Ikonomidis      Initial Encounter Date:    Cardiac Rehab from 09/13/2017 in [facility] Cardiac and Pulmonary Rehab  Date  09/13/17  Referring Provider  Ikonomidis      Visit Diagnosis: S/P CABG x 4  Patient's Home Medications on Admission:  Current Outpatient Medications:  .  acetaminophen (TYLENOL) 500 MG tablet, Take 500 mg by mouth every 8 (eight) hours as needed for mild pain or headache., Disp: , Rfl:  .  amiodarone (PACERONE) 200 MG tablet, , Disp: , Rfl:  .  aspirin (ASPIRIN EC) 81 MG EC tablet, Take 81 mg by mouth daily. Swallow whole., Disp: , Rfl:  .  atorvastatin (LIPITOR) 80 MG tablet, Take 80 mg by mouth daily., Disp: , Rfl:  .  calcium carbonate (TUMS - DOSED IN MG ELEMENTAL CALCIUM) 500 MG chewable tablet, Chew 2 tablets by mouth 2 (two) times daily as needed for indigestion or heartburn., Disp: , Rfl:  .  colchicine (COLCRYS) 0.6 MG tablet, , Disp: , Rfl:  .  ELIQUIS 5 MG TABS tablet, , Disp: , Rfl:  .  fluticasone (FLONASE) 50 MCG/ACT nasal spray, Place 2 sprays into both nostrils daily., Disp: , Rfl:  .  furosemide (LASIX) 40 MG tablet, , Disp: , Rfl:  .  gemfibrozil (LOPID) 600 MG tablet, Take 600 mg by mouth 2 (two) times daily before a meal., Disp: , Rfl:  .  isosorbide mononitrate (IMDUR) 60 MG 24 hr tablet, Take 60 mg by mouth 2 (two) times daily., Disp: , Rfl:  .  levothyroxine (SYNTHROID, LEVOTHROID) 150 MCG tablet, Take 150 mcg by mouth daily before breakfast., Disp: , Rfl:  .  metoprolol tartrate (LOPRESSOR) 25 MG tablet, , Disp: , Rfl:  .  Omega-3 Fatty Acids (FISH OIL) 1000 MG CAPS, Take 2,000 mg by mouth daily. , Disp: , Rfl:  .  omeprazole (PRILOSEC) 20 MG capsule, Take 20 mg by mouth daily., Disp: , Rfl:  .  sildenafil (VIAGRA)  100 MG tablet, Take by mouth., Disp: , Rfl:  .  tamsulosin (FLOMAX) 0.4 MG CAPS capsule, , Disp: , Rfl:   Past Medical History: Past Medical History:  Diagnosis Date  . Arthritis   . Barrett esophagus   . Benign neoplasm of abdomen   . Benign neoplasm of colon   . Coronary artery disease   . GERD (gastroesophageal reflux disease)   . Hypercholesterolemia   . Hypothyroidism   . Stroke [facility])     Tobacco Use: Social History   Tobacco Use  Smoking Status Former Smoker  . Packs/day: 1.00  . Last attempt to quit: 1985  . Years since quitting: 34.3  Smokeless Tobacco Never Used    Labs: Recent Review Flowsheet Data    There is no flowsheet data to display.       Exercise Target Goals:    Exercise Program Goal: Individual exercise prescription set using results from initial 6 min walk test and THRR while considering  patient's activity barriers and safety.   Exercise Prescription Goal: Initial exercise prescription builds to 30-45 minutes a day of aerobic activity, 2-3 days per week.  Home exercise guidelines will be given to patient during program as part of exercise prescription that the participant will acknowledge.  Activity Barriers &  Risk Stratification: Activity Barriers & Cardiac Risk Stratification - 09/13/17 1443      Activity Barriers & Cardiac Risk Stratification   Activity Barriers  None    Cardiac Risk Stratification  Moderate       6 Minute Walk: 6 Minute Walk    Row Name 09/13/17 1452         6 Minute Walk   Distance  1142 feet     Walk Time  6 minutes     # of Rest Breaks  0     MPH  2.16     METS  2.37     RPE  12     Perceived Dyspnea   1     VO2 Peak  8.3     Symptoms  No     Resting HR  61 bpm     Resting BP  134/80     Resting Oxygen Saturation   97 %     Exercise Oxygen Saturation  during 6 min walk  99 %     Max Ex. HR  93 bpm     Max Ex. BP  164/70     2 Minute Post BP  132/66        Oxygen Initial Assessment:   Oxygen  Re-Evaluation:   Oxygen Discharge (Final Oxygen Re-Evaluation):   Initial Exercise Prescription: Initial Exercise Prescription - 09/13/17 1400      Date of Initial Exercise RX and Referring Provider   Date  09/13/17    Referring Provider  Ikonomidis      Treadmill   MPH  1.8    Grade  0    Minutes  15    METs  2.5      Recumbant Bike   Level  3    RPM  60    Watts  10    Minutes  15    METs  2.5      Arm Ergometer   Level  1    RPM  40    Minutes  15    METs  2.5      T5 Nustep   Level  1    SPM  80    Minutes  15    METs  2.5      Prescription Details   Frequency (times per week)  3    Duration  Progress to 30 minutes of continuous aerobic without signs/symptoms of physical distress      Intensity   THRR 40-80% of Max Heartrate  95-128    Ratings of Perceived Exertion  11-13    Perceived Dyspnea  0-4      Resistance Training   Training Prescription  Yes    Weight  3 lb    Reps  10-15       Perform Capillary Blood Glucose checks as needed.  Exercise Prescription Changes: Exercise Prescription Changes    Row Name 09/13/17 1400 09/18/17 1500 09/20/17 0900         Response to Exercise   Blood Pressure (Admit)  134/80  126/64  -     Blood Pressure (Exercise)  164/70  132/64  -     Blood Pressure (Exit)  132/66  112/58  -     Heart Rate (Admit)  67 bpm  84 bpm  -     Heart Rate (Exercise)  93 bpm  93 bpm  -     Heart Rate (Exit)  66 bpm  58 bpm  -     Oxygen Saturation (Admit)  97 %  -  -     Rating of Perceived Exertion (Exercise)  12  13  -     Symptoms  -  none  -     Comments  -  first full day of exercise  -     Duration  -  Continue with 30 min of aerobic exercise without signs/symptoms of physical distress.  -     Intensity  -  THRR unchanged  -       Progression   Progression  -  Continue to progress workloads to maintain intensity without signs/symptoms of physical distress.  -     Average METs  -  2.14  -       Resistance Training    Training Prescription  -  Yes  -     Weight  -  3 lbs  -     Reps  -  10-15  -       Interval Training   Interval Training  -  No  -       Recumbant Bike   Level  -  1  -     Minutes  -  15  -     METs  -  2.74  -       Arm Ergometer   Level  -  1  -     Minutes  -  15  -     METs  -  1.5  -       Home Exercise Plan   Plans to continue exercise at  -  [employer] walk at home     Frequency  -  -  Add 2 additional days to program exercise sessions.     Initial Home Exercises Provided  -  -  09/20/17        Exercise Comments: Exercise Comments    Row Name 09/18/17 1020 09/20/17 0906         Exercise Comments   First full day of exercise!  Patient was oriented to gym and equipment including functions, settings, policies, and procedures.  Patient's individual exercise prescription and treatment plan were reviewed.  All starting workloads were established based on the results of the 6 minute walk test done at initial orientation visit.  The plan for exercise progression was also introduced and progression will be customized based on patient's performance and goals.   Reviewed home exercise with pt today.  Pt plans to attend [facility] and walk at home for exercise.  Reviewed THR, pulse, RPE, sign and symptoms, NTG use, and when to call 911 or MD.  Also discussed weather considerations and indoor options.  Pt voiced understanding.         Exercise Goals and Review: Exercise Goals    Row Name 09/13/17 1452             Exercise Goals   Increase Physical Activity  Yes       Intervention  Provide advice, education, support and counseling about physical activity/exercise needs.;Develop an individualized exercise prescription for aerobic and resistive training based on initial evaluation findings, risk stratification, comorbidities and participant's personal goals.       Expected Outcomes  Short Term: Attend rehab on a regular basis to increase amount of physical  activity.;Long Term: Add in home exercise to make exercise part of routine and to increase amount  of physical activity.;Long Term: Exercising regularly at least 3-5 days a week.       Increase Strength and Stamina  Yes       Intervention  Provide advice, education, support and counseling about physical activity/exercise needs.;Develop an individualized exercise prescription for aerobic and resistive training based on initial evaluation findings, risk stratification, comorbidities and participant's personal goals.       Expected Outcomes  Short Term: Increase workloads from initial exercise prescription for resistance, speed, and METs.;Long Term: Improve cardiorespiratory fitness, muscular endurance and strength as measured by increased METs and functional capacity (6MWT);Short Term: Perform resistance training exercises routinely during rehab and add in resistance training at home       Able to understand and use rate of perceived exertion (RPE) scale  Yes       Intervention  Provide education and explanation on how to use RPE scale       Expected Outcomes  Short Term: Able to use RPE daily in rehab to express subjective intensity level;Long Term:  Able to use RPE to guide intensity level when exercising independently       Able to understand and use Dyspnea scale  Yes       Intervention  Provide education and explanation on how to use Dyspnea scale       Expected Outcomes  Short Term: Able to use Dyspnea scale daily in rehab to express subjective sense of shortness of breath during exertion;Long Term: Able to use Dyspnea scale to guide intensity level when exercising independently       Knowledge and understanding of Target Heart Rate Range (THRR)  Yes       Intervention  Provide education and explanation of THRR including how the numbers were predicted and where they are located for reference       Expected Outcomes  Short Term: Able to state/look up THRR;Short Term: Able to use daily as guideline for  intensity in rehab;Long Term: Able to use THRR to govern intensity when exercising independently       Able to check pulse independently  Yes       Intervention  Provide education and demonstration on how to check pulse in carotid and radial arteries.;Review the importance of being able to check your own pulse for safety during independent exercise       Expected Outcomes  Short Term: Able to explain why pulse checking is important during independent exercise;Long Term: Able to check pulse independently and accurately       Understanding of Exercise Prescription  Yes       Intervention  Provide education, explanation, and written materials on patient's individual exercise prescription       Expected Outcomes  Short Term: Able to explain program exercise prescription;Long Term: Able to explain home exercise prescription to exercise independently          Exercise Goals Re-Evaluation : Exercise Goals Re-Evaluation    Row Name 09/18/17 1020             Exercise Goal Re-Evaluation   Exercise Goals Review  Able to understand and use rate of perceived exertion (RPE) scale;Knowledge and understanding of Target Heart Rate Range (THRR);Understanding of Exercise Prescription;Increase Physical Activity       Comments  Reviewed RPE scale, THR and program prescription with pt today.  Pt voiced understanding and was given a copy of goals to take home.        Expected Outcomes  Short: Use RPE daily  to regulate intensity.  Long: Follow program prescription in THR.          Discharge Exercise Prescription (Final Exercise Prescription Changes): Exercise Prescription Changes - 09/20/17 0900      Home Exercise Plan   Plans to continue exercise at  [facility] walk at home    Frequency  Add 2 additional days to program exercise sessions.    Initial Home Exercises Provided  09/20/17       Nutrition:  Target Goals: Understanding of nutrition guidelines, daily intake of sodium <1539m, cholesterol  <2033m calories 30% from fat and 7% or less from saturated fats, daily to have 5 or more servings of fruits and vegetables.  Biometrics: Pre Biometrics - 09/13/17 1452      Pre Biometrics   Height  _0  (1.651 m)    Weight  175 lb 12.8 oz (79.7 kg)    Waist Circumference  39 inches    Hip Circumference  39 inches    Waist to Hip Ratio  1 %    BMI (Calculated)  29.25    Single Leg Stand  12.51 seconds        Nutrition Therapy Plan and Nutrition Goals: Nutrition Therapy & Goals - 09/13/17 1434      Intervention Plan   Intervention  Prescribe, educate and counsel regarding individualized specific dietary modifications aiming towards targeted core components such as weight, hypertension, lipid management, diabetes, heart failure and other comorbidities.;Nutrition handout(s) given to patient.    Expected Outcomes  Short Term Goal: Understand basic principles of dietary content, such as calories, fat, sodium, cholesterol and nutrients.;Short Term Goal: A plan has been developed with personal nutrition goals set during dietitian appointment.;Long Term Goal: Adherence to prescribed nutrition plan.       Nutrition Assessments: Nutrition Assessments - 09/13/17 1434      MEDFICTS Scores   Pre Score  54       Nutrition Goals Re-Evaluation:   Nutrition Goals Discharge (Final Nutrition Goals Re-Evaluation):   Psychosocial: Target Goals: Acknowledge presence or absence of significant depression and/or stress, maximize coping skills, provide positive support system. Participant is able to verbalize types and ability to use techniques and skills needed for reducing stress and depression.   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 He is used to performing lots of yard work.       Family Dynamics   Good Support System?  Yes family      Barriers   Psychosocial barriers to participate in program  The patient should benefit from training in stress management and relaxation.;There are no identifiable barriers or psychosocial needs.      Screening Interventions   Interventions  Encouraged to exercise;Provide feedback about the scores to participant;Program counselor consult;To provide support and resources with identified psychosocial needs    Expected Outcomes  Short Term goal: Utilizing psychosocial counselor, staff and physician to assist with identification of specific Stressors or current issues interfering with healing process. Setting desired goal for each stressor or current issue identified.;Long Term Goal: Stressors or current issues are controlled or eliminated.;Short Term goal: Identification and review with participant of any Quality of Life or Depression concerns  found by scoring the questionnaire.;Long Term goal: The participant improves quality of Life and PHQ9 Scores as seen by post scores and/or verbalization of changes       Quality of Life Scores:  Quality of Life - 09/13/17 1440      Quality of Life Scores   Health/Function Pre  26.8 %    Socioeconomic Pre  30 %    Psych/Spiritual Pre  30 %    Family Pre  28.8 %    GLOBAL Pre  28.36 %      Scores of 19 and below usually indicate a poorer quality of life in these areas.  A difference of  2-3 points is a clinically meaningful difference.  A difference of 2-3 points in the total score of the Quality of Life Index has been associated with significant improvement in overall quality of life, self-image, physical symptoms, and general health in studies assessing change in quality of life.  PHQ-9: Recent Review Flowsheet Data    Depression screen [facility] 2/9 09/13/2017   Decreased Interest 0   Down, Depressed, Hopeless 0   PHQ - 2 Score 0   Altered  sleeping 3   Tired, decreased energy 2   Change in appetite 0   Feeling bad or failure about yourself  0   Trouble concentrating 0   Moving slowly or fidgety/restless 0   Suicidal thoughts 0   PHQ-9 Score 5   Difficult doing work/chores Not difficult at all     Interpretation of Total Score  Total Score Depression Severity:  1-4 = Minimal depression, 5-9 = Mild depression, 10-14 = Moderate depression, 15-19 = Moderately severe depression, 20-27 = Severe depression   Psychosocial Evaluation and Intervention:   Psychosocial Re-Evaluation:   Psychosocial Discharge (Final Psychosocial Re-Evaluation):   Vocational Rehabilitation: Provide vocational rehab assistance to qualifying candidates.   Vocational Rehab Evaluation & Intervention: Vocational Rehab - 09/13/17 1442      Initial Vocational Rehab Evaluation & Intervention   Assessment shows need for Vocational Rehabilitation  No       Education: Education Goals: Education classes will be provided on a variety of topics geared toward better understanding of heart health and risk factor modification. Participant will state understanding/return demonstration of topics presented as noted by education test scores.  Learning Barriers/Preferences: Learning Barriers/Preferences - 09/13/17 1441      Learning Barriers/Preferences   Learning Barriers  Hearing    Learning Preferences  None       Education Topics:  AED/CPR: - Group verbal and written instruction with the use of models to demonstrate the basic use of the AED with the basic ABC's of resuscitation.   General Nutrition Guidelines/Fats and Fiber: -Group instruction provided by verbal, written material, models and posters to present the general guidelines for heart healthy nutrition. Gives an explanation and review of dietary fats and fiber.   Controlling Sodium/Reading Food Labels: -Group verbal and written material supporting the discussion of sodium use in heart  healthy nutrition. Review and explanation with models, verbal and written materials for utilization of the food label.   Exercise Physiology & General Exercise Guidelines: - Group verbal and written instruction with models to review the exercise physiology of the cardiovascular system and associated critical values. Provides general exercise guidelines with specific guidelines to those with heart or lung disease.    Aerobic Exercise & Resistance Training: - Gives group verbal and written instruction on the various components of exercise. Focuses on aerobic and resistive training  programs and the benefits of this training and how to safely progress through these programs..   Cardiac Rehab from 09/25/2017 in [facility] Cardiac and Pulmonary Rehab  Date  09/18/17  Educator  AS  Instruction Review Code  1- Verbalizes Understanding      Flexibility, Balance, Mind/Body Relaxation: Provides group verbal/written instruction on the benefits of flexibility and balance training, including mind/body exercise modes such as yoga, pilates and tai chi.  Demonstration and skill practice provided.   Cardiac Rehab from 09/25/2017 in [facility] Cardiac and Pulmonary Rehab  Date  09/25/17  Educator  AS  Instruction Review Code  1- Verbalizes Understanding      Stress and Anxiety: - Provides group verbal and written instruction about the health risks of elevated stress and causes of high stress.  Discuss the correlation between heart/lung disease and anxiety and treatment options. Review healthy ways to manage with stress and anxiety.   Depression: - Provides group verbal and written instruction on the correlation between heart/lung disease and depressed mood, treatment options, and the stigmas associated with seeking treatment.   Cardiac Rehab from 09/25/2017 in [facility] Cardiac and Pulmonary Rehab  Date  09/20/17  Educator  [facility]  Instruction Review Code  1- Verbalizes Understanding      Anatomy & Physiology of the  Heart: - Group verbal and written instruction and models provide basic cardiac anatomy and physiology, with the coronary electrical and arterial systems. Review of Valvular disease and Heart Failure   Cardiac Procedures: - Group verbal and written instruction to review commonly prescribed medications for heart disease. Reviews the medication, class of the drug, and side effects. Includes the steps to properly store meds and maintain the prescription regimen. (beta blockers and nitrates)   Cardiac Medications I: - Group verbal and written instruction to review commonly prescribed medications for heart disease. Reviews the medication, class of the drug, and side effects. Includes the steps to properly store meds and maintain the prescription regimen.   Cardiac Medications II: -Group verbal and written instruction to review commonly prescribed medications for heart disease. Reviews the medication, class of the drug, and side effects. (all other drug classes)    Go Sex-Intimacy & Heart Disease, Get SMART - Goal Setting: - Group verbal and written instruction through game format to discuss heart disease and the return to sexual intimacy. Provides group verbal and written material to discuss and apply goal setting through the application of the S.M.A.R.T. Method.   Other Matters of the Heart: - Provides group verbal, written materials and models to describe Stable Angina and Peripheral Artery. Includes description of the disease process and treatment options available to the cardiac patient.   Exercise & Equipment Safety: - Individual verbal instruction and demonstration of equipment use and safety with use of the equipment.   Cardiac Rehab from 09/25/2017 in [facility] Cardiac and Pulmonary Rehab  Date  09/13/17  Educator  [facility]  Instruction Review Code  1- Verbalizes Understanding      Infection Prevention: - Provides verbal and written material to individual with discussion of infection control  including proper hand washing and proper equipment cleaning during exercise session.   Cardiac Rehab from 09/25/2017 in [facility] Cardiac and Pulmonary Rehab  Date  09/13/17  Educator  [facility]  Instruction Review Code  1- Verbalizes Understanding      Falls Prevention: - Provides verbal and written material to individual with discussion of falls prevention and safety.   Cardiac Rehab from 09/25/2017 in [facility] Cardiac and Pulmonary  Rehab  Date  09/13/17  Educator  Ethel  Instruction Review Code  1- Verbalizes Understanding      Diabetes: - Individual verbal and written instruction to review signs/symptoms of diabetes, desired ranges of glucose level fasting, after meals and with exercise. Acknowledge that pre and post exercise glucose checks will be done for 3 sessions at entry of program.   Know Your Numbers and Risk Factors: -Group verbal and written instruction about important numbers in your health.  Discussion of what are risk factors and how they play a role in the disease process.  Review of Cholesterol, Blood Pressure, Diabetes, and BMI and the role they play in your overall health.   Sleep Hygiene: -Provides group verbal and written instruction about how sleep can affect your health.  Define sleep hygiene, discuss sleep cycles and impact of sleep habits. Review good sleep hygiene tips.    Other: -Provides group and verbal instruction on various topics (see comments)   Knowledge Questionnaire Score: Knowledge Questionnaire Score - 09/13/17 1441      Knowledge Questionnaire Score   Pre Score  23/28 correct answers reviewed with Hollice Espy        Core Components/Risk Factors/Patient Goals at Admission: Personal Goals and Risk Factors at Admission - 09/13/17 1433      Core Components/Risk Factors/Patient Goals on Admission    Weight Management  Yes;Weight Maintenance    Intervention  Weight Management: Develop a combined nutrition and exercise program designed to reach desired caloric  intake, while maintaining appropriate intake of nutrient and fiber, sodium and fats, and appropriate energy expenditure required for the weight goal.;Weight Management: Provide education and appropriate resources to help participant work on and attain dietary goals.    Admit Weight  175 lb (79.4 kg)    Expected Outcomes  Short Term: Continue to assess and modify interventions until short term weight is achieved;Long Term: Adherence to nutrition and physical activity/exercise program aimed toward attainment of established weight goal;Weight Maintenance: Understanding of the daily nutrition guidelines, which includes 25-35% calories from fat, 7% or less cal from saturated fats, less than 217m cholesterol, less than 1.5gm of sodium, & 5 or more servings of fruits and vegetables daily;Understanding recommendations for meals to include 15-35% energy as protein, 25-35% energy from fat, 35-60% energy from carbohydrates, less than 2021mof dietary cholesterol, 20-35 gm of total fiber daily;Understanding of distribution of calorie intake throughout the day with the consumption of 4-5 meals/snacks    Hypertension  Yes    Intervention  Provide education on lifestyle modifcations including regular physical activity/exercise, weight management, moderate sodium restriction and increased consumption of fresh fruit, vegetables, and low fat dairy, alcohol moderation, and smoking cessation.;Monitor prescription use compliance.    Expected Outcomes  Short Term: Continued assessment and intervention until BP is < 140/9056mG in hypertensive participants. < 130/70m42m in hypertensive participants with diabetes, heart failure or chronic kidney disease.;Long Term: Maintenance of blood pressure at goal levels.    Lipids  Yes    Intervention  Provide education and support for participant on nutrition & aerobic/resistive exercise along with prescribed medications to achieve LDL <70mg37mL >40mg.9mExpected Outcomes  Short Term:  Participant states understanding of desired cholesterol values and is compliant with medications prescribed. Participant is following exercise prescription and nutrition guidelines.;Long Term: Cholesterol controlled with medications as prescribed, with individualized exercise RX and with personalized nutrition plan. Value goals: LDL < 70mg, 56m> 40 mg.  Core Components/Risk Factors/Patient Goals Review:    Core Components/Risk Factors/Patient Goals at Discharge (Final Review):    ITP Comments: ITP Comments    Row Name 09/13/17 1421 09/26/17 0546         ITP Comments  Med Review completed. Initial ITP created. Diagnosis can be found in Care Everywhere 08/06/17  30 day review. Continue with ITP unless directed changes per Medical Director  New to program         Comments:

## 2017-09-27 ENCOUNTER — Encounter: Payer: Medicare PPO | Admitting: *Deleted

## 2017-09-27 DIAGNOSIS — Z48812 Encounter for surgical aftercare following surgery on the circulatory system: Secondary | ICD-10-CM | POA: Diagnosis not present

## 2017-09-27 DIAGNOSIS — Z951 Presence of aortocoronary bypass graft: Secondary | ICD-10-CM

## 2017-09-27 NOTE — Progress Notes (Signed)
Daily Session Note  Patient Details  Name: Warren Ingram MRN: [id number] Date of Birth: 12/22/1940 Referring Provider:     Cardiac Rehab from 09/13/2017 in [facility]  Referring Provider  Ikonomidis      Encounter Date: 09/27/2017  Check In: Session Check In - 09/27/17 0828      Check-In   Location  [facility]    Staff Present  Jessica Hawkins, MA, RCEP, CCRP, Exercise Physiologist;Amanda Sommer, BA, ACSM CEP, Exercise Physiologist;Carroll Enterkin, RN, BSN    Supervising physician immediately available to respond to emergencies  See telemetry face sheet for immediately available ER MD    Medication changes reported      No    Fall or balance concerns reported     No    Warm-up and Cool-down  Performed on first and last piece of equipment    Resistance Training Performed  Yes    VAD Patient?  No      Pain Assessment   Currently in Pain?  No/denies          Social History   Tobacco Use  Smoking Status Former Smoker  . Packs/day: 1.00  . Last attempt to quit: 1985  . Years since quitting: 34.3  Smokeless Tobacco Never Used    Goals Met:  Independence with exercise equipment Exercise tolerated well No report of cardiac concerns or symptoms Strength training completed today  Goals Unmet:  Not Applicable  Comments: Pt able to follow exercise prescription today without complaint.  Will continue to monitor for progression.    Dr. Mark Miller is Medical Director for [facility] and [facility]. 

## 2017-10-03 ENCOUNTER — Telehealth: Payer: Self-pay

## 2017-10-03 NOTE — Telephone Encounter (Signed)
Lenton has had a bad cold and will not attend class Thursday

## 2017-10-04 ENCOUNTER — Encounter: Payer: Medicare PPO | Attending: Surgery

## 2017-10-04 DIAGNOSIS — Z951 Presence of aortocoronary bypass graft: Secondary | ICD-10-CM | POA: Insufficient documentation

## 2017-10-04 DIAGNOSIS — Z48812 Encounter for surgical aftercare following surgery on the circulatory system: Secondary | ICD-10-CM | POA: Insufficient documentation

## 2017-10-09 DIAGNOSIS — Z951 Presence of aortocoronary bypass graft: Secondary | ICD-10-CM | POA: Diagnosis not present

## 2017-10-09 DIAGNOSIS — Z48812 Encounter for surgical aftercare following surgery on the circulatory system: Secondary | ICD-10-CM | POA: Diagnosis not present

## 2017-10-09 NOTE — Progress Notes (Signed)
Daily Session Note  Patient Details  Name: Warren Ingram MRN: [id number] Date of Birth: 1941-01-09 Referring Provider:     Cardiac Rehab from 09/13/2017 in [facility]  Referring Provider  Ikonomidis      Encounter Date: 10/09/2017  Check In: Session Check In - 10/09/17 0828      Check-In   Location  [facility]    Staff Present  Alberteen Sam, MA, RCEP, CCRP, Exercise Physiologist;Amanda Oletta Darter, BA, ACSM CEP, Exercise Physiologist;Susanne Bice, RN, BSN, CCRP    Supervising physician immediately available to respond to emergencies  See telemetry face sheet for immediately available ER MD    Medication changes reported      No    Fall or balance concerns reported     No    Warm-up and Cool-down  Performed on first and last piece of equipment    Resistance Training Performed  Yes    VAD Patient?  No      Pain Assessment   Currently in Pain?  No/denies    Multiple Pain Sites  No          Social History   Tobacco Use  Smoking Status Former Smoker  . Packs/day: 1.00  . Last attempt to quit: 1985  . Years since quitting: 34.3  Smokeless Tobacco Never Used    Goals Met:  Independence with exercise equipment Exercise tolerated well No report of cardiac concerns or symptoms Strength training completed today  Goals Unmet:  Not Applicable  Comments: Pt able to follow exercise prescription today without complaint.  Will continue to monitor for progression.    Dr. Emily Filbert is Medical Director for [place] and [facility].

## 2017-10-11 ENCOUNTER — Encounter: Payer: Medicare PPO | Admitting: *Deleted

## 2017-10-11 DIAGNOSIS — Z951 Presence of aortocoronary bypass graft: Secondary | ICD-10-CM

## 2017-10-11 DIAGNOSIS — Z48812 Encounter for surgical aftercare following surgery on the circulatory system: Secondary | ICD-10-CM | POA: Diagnosis not present

## 2017-10-11 NOTE — Progress Notes (Signed)
Daily Session Note  Patient Details  Name: Warren Ingram MRN: [id number] Date of Birth: 06-01-1941 Referring Provider:     Cardiac Rehab from 09/13/2017 in [facility]  Referring Provider  Ikonomidis      Encounter Date: 10/11/2017  Check In: Session Check In - 10/11/17 9432      Check-In   Location  [facility]    Staff Present  Alberteen Sam, MA, RCEP, CCRP, Exercise Physiologist;Amanda Oletta Darter, BA, ACSM CEP, Exercise Physiologist;Carroll Enterkin, RN, BSN    Supervising physician immediately available to respond to emergencies  See telemetry face sheet for immediately available ER MD    Medication changes reported      No    Fall or balance concerns reported     No    Warm-up and Cool-down  Performed on first and last piece of equipment    Resistance Training Performed  Yes    VAD Patient?  No      Pain Assessment   Currently in Pain?  No/denies          Social History   Tobacco Use  Smoking Status Former Smoker  . Packs/day: 1.00  . Last attempt to quit: 1985  . Years since quitting: 34.3  Smokeless Tobacco Never Used    Goals Met:  Independence with exercise equipment Exercise tolerated well Personal goals reviewed No report of cardiac concerns or symptoms Strength training completed today  Goals Unmet:  Not Applicable  Comments: Pt able to follow exercise prescription today without complaint.  Will continue to monitor for progression. See ITP for goal review Reviewed home exercise with pt today.  Pt plans to walk at home for exercise.  Reviewed THR, pulse, RPE, sign and symptoms, and when to call 911 or MD.  Also discussed weather considerations and indoor options.  Pt voiced understanding.    Dr. Emily Filbert is Medical Director for [place] and [facility].

## 2017-10-16 DIAGNOSIS — Z48812 Encounter for surgical aftercare following surgery on the circulatory system: Secondary | ICD-10-CM | POA: Diagnosis not present

## 2017-10-16 DIAGNOSIS — Z951 Presence of aortocoronary bypass graft: Secondary | ICD-10-CM

## 2017-10-16 NOTE — Progress Notes (Signed)
Daily Session Note  Patient Details  Name: Warren Ingram MRN: [id number] Date of Birth: 04-21-1941 Referring Provider:     Cardiac Rehab from 09/13/2017 in [facility]  Referring Provider  Ikonomidis      Encounter Date: 10/16/2017  Check In: Session Check In - 10/16/17 0911      Check-In   Location  [facility]    Staff Present  Heath Lark, RN, BSN, CCRP;Jessica [place], MA, RCEP, CCRP, Exercise Physiologist;Cason Luffman Oletta Darter, [place], ACSM CEP, Exercise Physiologist    Supervising physician immediately available to respond to emergencies  See telemetry face sheet for immediately available ER MD    Medication changes reported      No    Fall or balance concerns reported     No    Warm-up and Cool-down  Performed on first and last piece of equipment    Resistance Training Performed  Yes    VAD Patient?  No      Pain Assessment   Currently in Pain?  No/denies    Multiple Pain Sites  No          Social History   Tobacco Use  Smoking Status Former Smoker  . Packs/day: 1.00  . Last attempt to quit: 1985  . Years since quitting: 34.3  Smokeless Tobacco Never Used    Goals Met:  Independence with exercise equipment Exercise tolerated well No report of cardiac concerns or symptoms Strength training completed today  Goals Unmet:  Not Applicable  Comments: Pt able to follow exercise prescription today without complaint.  Will continue to monitor for progression.    Dr. Emily Filbert is Medical Director for [place] and [facility].

## 2017-10-18 DIAGNOSIS — Z48812 Encounter for surgical aftercare following surgery on the circulatory system: Secondary | ICD-10-CM | POA: Diagnosis not present

## 2017-10-18 DIAGNOSIS — Z951 Presence of aortocoronary bypass graft: Secondary | ICD-10-CM

## 2017-10-18 NOTE — Progress Notes (Signed)
Daily Session Note  Patient Details  Name: Warren Ingram MRN: [id number] Date of Birth: 06/03/1941 Referring Provider:     Cardiac Rehab from 09/13/2017 in [facility]  Referring Provider  Ikonomidis      Encounter Date: 10/18/2017  Check In: Session Check In - 10/18/17 0952      Check-In   Location  [facility]    Staff Present  Gerlene Burdock, RN, BSN;Hildagard Sobecki Luan Pulling, MA, RCEP, CCRP, Exercise Physiologist;Amanda Oletta Darter, [place], ACSM CEP, Exercise Physiologist    Supervising physician immediately available to respond to emergencies  See telemetry face sheet for immediately available ER MD    Medication changes reported      No    Fall or balance concerns reported     No    Warm-up and Cool-down  Performed on first and last piece of equipment    Resistance Training Performed  Yes    VAD Patient?  No      Pain Assessment   Currently in Pain?  No/denies          Social History   Tobacco Use  Smoking Status Former Smoker  . Packs/day: 1.00  . Last attempt to quit: 1985  . Years since quitting: 34.3  Smokeless Tobacco Never Used    Goals Met:  Independence with exercise equipment Exercise tolerated well No report of cardiac concerns or symptoms Strength training completed today  Goals Unmet:  Not Applicable  Comments: Pt able to follow exercise prescription today without complaint.  Will continue to monitor for progression.    Dr. Emily Filbert is Medical Director for [place] and [facility].

## 2017-10-24 ENCOUNTER — Encounter: Payer: Self-pay | Admitting: *Deleted

## 2017-10-24 DIAGNOSIS — Z951 Presence of aortocoronary bypass graft: Secondary | ICD-10-CM

## 2017-10-24 NOTE — Progress Notes (Signed)
Cardiac Individual Treatment Plan  Patient Details  Name: Warren Ingram MRN: [id number] Date of Birth: 1940/07/14 Referring Provider:     Cardiac Rehab from 09/13/2017 in [facility] Cardiac and Pulmonary Rehab  Referring Provider  Ikonomidis      Initial Encounter Date:    Cardiac Rehab from 09/13/2017 in [facility] Cardiac and Pulmonary Rehab  Date  09/13/17  Referring Provider  Ikonomidis      Visit Diagnosis: S/P CABG x 4  Patient's Home Medications on Admission:  Current Outpatient Medications:  .  acetaminophen (TYLENOL) 500 MG tablet, Take 500 mg by mouth every 8 (eight) hours as needed for mild pain or headache., Disp: , Rfl:  .  amiodarone (PACERONE) 200 MG tablet, , Disp: , Rfl:  .  aspirin (ASPIRIN EC) 81 MG EC tablet, Take 81 mg by mouth daily. Swallow whole., Disp: , Rfl:  .  atorvastatin (LIPITOR) 80 MG tablet, Take 80 mg by mouth daily., Disp: , Rfl:  .  calcium carbonate (TUMS - DOSED IN MG ELEMENTAL CALCIUM) 500 MG chewable tablet, Chew 2 tablets by mouth 2 (two) times daily as needed for indigestion or heartburn., Disp: , Rfl:  .  colchicine (COLCRYS) 0.6 MG tablet, , Disp: , Rfl:  .  ELIQUIS 5 MG TABS tablet, , Disp: , Rfl:  .  fluticasone (FLONASE) 50 MCG/ACT nasal spray, Place 2 sprays into both nostrils daily., Disp: , Rfl:  .  furosemide (LASIX) 40 MG tablet, , Disp: , Rfl:  .  gemfibrozil (LOPID) 600 MG tablet, Take 600 mg by mouth 2 (two) times daily before a meal., Disp: , Rfl:  .  isosorbide mononitrate (IMDUR) 60 MG 24 hr tablet, Take 60 mg by mouth 2 (two) times daily., Disp: , Rfl:  .  levothyroxine (SYNTHROID, LEVOTHROID) 150 MCG tablet, Take 150 mcg by mouth daily before breakfast., Disp: , Rfl:  .  metoprolol tartrate (LOPRESSOR) 25 MG tablet, , Disp: , Rfl:  .  Omega-3 Fatty Acids (FISH OIL) 1000 MG CAPS, Take 2,000 mg by mouth daily. , Disp: , Rfl:  .  omeprazole (PRILOSEC) 20 MG capsule, Take 20 mg by mouth daily., Disp: , Rfl:  .  sildenafil (VIAGRA)  100 MG tablet, Take by mouth., Disp: , Rfl:  .  tamsulosin (FLOMAX) 0.4 MG CAPS capsule, , Disp: , Rfl:   Past Medical History: Past Medical History:  Diagnosis Date  . Arthritis   . Barrett esophagus   . Benign neoplasm of abdomen   . Benign neoplasm of colon   . Coronary artery disease   . GERD (gastroesophageal reflux disease)   . Hypercholesterolemia   . Hypothyroidism   . Stroke [facility])     Tobacco Use: Social History   Tobacco Use  Smoking Status Former Smoker  . Packs/day: 1.00  . Last attempt to quit: 1985  . Years since quitting: 34.4  Smokeless Tobacco Never Used    Labs: Recent Review Flowsheet Data    There is no flowsheet data to display.       Exercise Target Goals:    Exercise Program Goal: Individual exercise prescription set using results from initial 6 min walk test and THRR while considering  patient's activity barriers and safety.   Exercise Prescription Goal: Initial exercise prescription builds to 30-45 minutes a day of aerobic activity, 2-3 days per week.  Home exercise guidelines will be given to patient during program as part of exercise prescription that the participant will acknowledge.  Activity Barriers &  Risk Stratification: Activity Barriers & Cardiac Risk Stratification - 09/13/17 1443      Activity Barriers & Cardiac Risk Stratification   Activity Barriers  None    Cardiac Risk Stratification  Moderate       6 Minute Walk: 6 Minute Walk    Row Name 09/13/17 1452         6 Minute Walk   Distance  1142 feet     Walk Time  6 minutes     # of Rest Breaks  0     MPH  2.16     METS  2.37     RPE  12     Perceived Dyspnea   1     VO2 Peak  8.3     Symptoms  No     Resting HR  61 bpm     Resting BP  134/80     Resting Oxygen Saturation   97 %     Exercise Oxygen Saturation  during 6 min walk  99 %     Max Ex. HR  93 bpm     Max Ex. BP  164/70     2 Minute Post BP  132/66        Oxygen Initial Assessment:   Oxygen  Re-Evaluation:   Oxygen Discharge (Final Oxygen Re-Evaluation):   Initial Exercise Prescription: Initial Exercise Prescription - 09/13/17 1400      Date of Initial Exercise RX and Referring Provider   Date  09/13/17    Referring Provider  Ikonomidis      Treadmill   MPH  1.8    Grade  0    Minutes  15    METs  2.5      Recumbant Bike   Level  3    RPM  60    Watts  10    Minutes  15    METs  2.5      Arm Ergometer   Level  1    RPM  40    Minutes  15    METs  2.5      T5 Nustep   Level  1    SPM  80    Minutes  15    METs  2.5      Prescription Details   Frequency (times per week)  3    Duration  Progress to 30 minutes of continuous aerobic without signs/symptoms of physical distress      Intensity   THRR 40-80% of Max Heartrate  95-128    Ratings of Perceived Exertion  11-13    Perceived Dyspnea  0-4      Resistance Training   Training Prescription  Yes    Weight  3 lb    Reps  10-15       Perform Capillary Blood Glucose checks as needed.  Exercise Prescription Changes: Exercise Prescription Changes    Row Name 09/13/17 1400 09/18/17 1500 09/20/17 0900 10/02/17 1500 10/16/17 1300     Response to Exercise   Blood Pressure (Admit)  134/80  126/64  -  128/60  120/60   Blood Pressure (Exercise)  164/70  132/64  -  124/72  122/64   Blood Pressure (Exit)  132/66  112/58  -  124/70  130/66   Heart Rate (Admit)  67 bpm  84 bpm  -  57 bpm  74 bpm   Heart Rate (Exercise)  93 bpm  93 bpm  -  97 bpm  106 bpm   Heart Rate (Exit)  66 bpm  58 bpm  -  62 bpm  58 bpm   Oxygen Saturation (Admit)  97 %  -  -  -  -   Rating of Perceived Exertion (Exercise)  12  13  -  11  13   Symptoms  -  none  -  none  none   Comments  -  first full day of exercise  -  -  -   Duration  -  Continue with 30 min of aerobic exercise without signs/symptoms of physical distress.  -  Continue with 30 min of aerobic exercise without signs/symptoms of physical distress.  Continue with 30  min of aerobic exercise without signs/symptoms of physical distress.   Intensity  -  THRR unchanged  -  THRR unchanged  THRR unchanged     Progression   Progression  -  Continue to progress workloads to maintain intensity without signs/symptoms of physical distress.  -  Continue to progress workloads to maintain intensity without signs/symptoms of physical distress.  Continue to progress workloads to maintain intensity without signs/symptoms of physical distress.   Average METs  -  2.14  -  2.36  2.49     Resistance Training   Training Prescription  -  Yes  -  Yes  Yes   Weight  -  3 lbs  -  3 lbs  3 lbs   Reps  -  10-15  -  10-15  10-15     Interval Training   Interval Training  -  No  -  No  No     Treadmill   MPH  -  -  -  1.8  1.8   Grade  -  -  -  0  0   Minutes  -  -  -  15  15   METs  -  -  -  2.38  2.38     Recumbant Bike   Level  -  1  -  7  9   Watts  -  -  -  36  34   Minutes  -  15  -  15  15   METs  -  2.74  -  3.3  3.38     Arm Ergometer   Level  -  1  -  1  3   Minutes  -  15  -  15  15   METs  -  1.5  -  1.4  1.7     Home Exercise Plan   Plans to continue exercise at  -  -  [employer] walk at home  [employer] walk at home  [employer] walk at home   Frequency  -  -  Add 2 additional days to program exercise sessions.  Add 2 additional days to program exercise sessions.  Add 2 additional days to program exercise sessions.   Initial Home Exercises Provided  -  -  09/20/17  09/20/17  09/20/17      Exercise Comments: Exercise Comments    Row Name 09/18/17 1020 09/20/17 0906         Exercise Comments   First full day of exercise!  Patient was oriented to gym and equipment including functions, settings, policies, and procedures.  Patient's individual exercise prescription and treatment plan were reviewed.  All starting workloads were established based on the results of  the 6 minute walk test done at initial orientation visit.  The plan for exercise progression  was also introduced and progression will be customized based on patient's performance and goals.   Reviewed home exercise with pt today.  Pt plans to attend Register and walk at home for exercise.  Reviewed THR, pulse, RPE, sign and symptoms, NTG use, and when to call 911 or MD.  Also discussed weather considerations and indoor options.  Pt voiced understanding.         Exercise Goals and Review: Exercise Goals    Row Name 09/13/17 1452             Exercise Goals   Increase Physical Activity  Yes       Intervention  Provide advice, education, support and counseling about physical activity/exercise needs.;Develop an individualized exercise prescription for aerobic and resistive training based on initial evaluation findings, risk stratification, comorbidities and participant's personal goals.       Expected Outcomes  Short Term: Attend rehab on a regular basis to increase amount of physical activity.;Long Term: Add in home exercise to make exercise part of routine and to increase amount of physical activity.;Long Term: Exercising regularly at least 3-5 days a week.       Increase Strength and Stamina  Yes       Intervention  Provide advice, education, support and counseling about physical activity/exercise needs.;Develop an individualized exercise prescription for aerobic and resistive training based on initial evaluation findings, risk stratification, comorbidities and participant's personal goals.       Expected Outcomes  Short Term: Increase workloads from initial exercise prescription for resistance, speed, and METs.;Long Term: Improve cardiorespiratory fitness, muscular endurance and strength as measured by increased METs and functional capacity (6MWT);Short Term: Perform resistance training exercises routinely during rehab and add in resistance training at home       Able to understand and use rate of perceived exertion (RPE) scale  Yes       Intervention  Provide education and explanation  on how to use RPE scale       Expected Outcomes  Short Term: Able to use RPE daily in rehab to express subjective intensity level;Long Term:  Able to use RPE to guide intensity level when exercising independently       Able to understand and use Dyspnea scale  Yes       Intervention  Provide education and explanation on how to use Dyspnea scale       Expected Outcomes  Short Term: Able to use Dyspnea scale daily in rehab to express subjective sense of shortness of breath during exertion;Long Term: Able to use Dyspnea scale to guide intensity level when exercising independently       Knowledge and understanding of Target Heart Rate Range (THRR)  Yes       Intervention  Provide education and explanation of THRR including how the numbers were predicted and where they are located for reference       Expected Outcomes  Short Term: Able to state/look up THRR;Short Term: Able to use daily as guideline for intensity in rehab;Long Term: Able to use THRR to govern intensity when exercising independently       Able to check pulse independently  Yes       Intervention  Provide education and demonstration on how to check pulse in carotid and radial arteries.;Review the importance of being able to check your own pulse for safety during independent exercise  Expected Outcomes  Short Term: Able to explain why pulse checking is important during independent exercise;Long Term: Able to check pulse independently and accurately       Understanding of Exercise Prescription  Yes       Intervention  Provide education, explanation, and written materials on patient's individual exercise prescription       Expected Outcomes  Short Term: Able to explain program exercise prescription;Long Term: Able to explain home exercise prescription to exercise independently          Exercise Goals Re-Evaluation : Exercise Goals Re-Evaluation    Row Name 09/18/17 1020 10/02/17 1542 10/11/17 1023 10/16/17 1348       Exercise Goal  Re-Evaluation   Exercise Goals Review  Able to understand and use rate of perceived exertion (RPE) scale;Knowledge and understanding of Target Heart Rate Range (THRR);Understanding of Exercise Prescription;Increase Physical Activity  Increase Physical Activity;Understanding of Exercise Prescription;Increase Strength and Stamina  Increase Physical Activity;Understanding of Exercise Prescription;Increase Strength and Stamina  Increase Physical Activity;Understanding of Exercise Prescription;Increase Strength and Stamina    Comments  Reviewed RPE scale, THR and program prescription with pt today.  Pt voiced understanding and was given a copy of goals to take home.   Tobey has been doing well in rehab.  He is now up to level 7 on the recumbent bike and 36 watts.  We will continue to monitor his progression.   Datron is doing well in rehab.  He is starting to feel stronger and has been able to start getting back to some of his normal activities.   He would like to continue to build his strength back up. Reviewed home exercise with pt today.  Pt plans to walk at home for exercise.  Reviewed THR, pulse, RPE, sign and symptoms, and when to call 911 or MD.  Also discussed weather considerations and indoor options.  Pt voiced understanding.  Xavyer continues to do well in rehab.  He is up to 34 watts on the recumbent bike now.  We will try to add some incline to his treadmill.  We will continue to monitor his progress.     Expected Outcomes  Short: Use RPE daily to regulate intensity.  Long: Follow program prescription in THR.  Short: Increase workload on arm crank.  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   Short: Add in home exercise consistently.  Long: Continue to increase strength and stamina.   Short: Add incline to treadmill.  Long: Continue to exercise more at home.        Discharge Exercise Prescription (Final Exercise Prescription Changes): Exercise Prescription Changes - 10/16/17 1300       Response to Exercise   Blood Pressure (Admit)  120/60    Blood Pressure (Exercise)  122/64    Blood Pressure (Exit)  130/66    Heart Rate (Admit)  74 bpm    Heart Rate (Exercise)  106 bpm    Heart Rate (Exit)  58 bpm    Rating of Perceived Exertion (Exercise)  13    Symptoms  none    Duration  Continue with 30 min of aerobic exercise without signs/symptoms of physical distress.    Intensity  THRR unchanged      Progression   Progression  Continue to progress workloads to maintain intensity without signs/symptoms of physical distress.    Average METs  2.49      Resistance Training   Training Prescription  Yes    Weight  3 lbs    Reps  10-15      Interval Training   Interval Training  No      Treadmill   MPH  1.8    Grade  0    Minutes  15    METs  2.38      Recumbant Bike   Level  9    Watts  34    Minutes  15    METs  3.38      Arm Ergometer   Level  3    Minutes  15    METs  1.7      Home Exercise Plan   Plans to continue exercise at  [employer] walk at home    Frequency  Add 2 additional days to program exercise sessions.    Initial Home Exercises Provided  09/20/17       Nutrition:  Target Goals: Understanding of nutrition guidelines, daily intake of sodium <1574m, cholesterol <202m calories 30% from fat and 7% or less from saturated fats, daily to have 5 or more servings of fruits and vegetables.  Biometrics: Pre Biometrics - 09/13/17 1452      Pre Biometrics   Height  5' 5" (1.651 m)    Weight  175 lb 12.8 oz (79.7 kg)    Waist Circumference  39 inches    Hip Circumference  39 inches    Waist to Hip Ratio  1 %    BMI (Calculated)  29.25    Single Leg Stand  12.51 seconds        Nutrition Therapy Plan and Nutrition Goals: Nutrition Therapy & Goals - 09/27/17 0916      Nutrition Therapy   Diet  DASH    Drug/Food Interactions  Statins/Certain Fruits    Protein (specify units)  9oz    Fiber  30 grams    Whole Grain Foods  3  servings    Saturated Fats  15 max. grams    Fruits and Vegetables  6 servings/day 8 ideal    Sodium  2000 grams      Personal Nutrition Goals   Nutrition Goal  Monitor portion sizes more closely at meal times. Utilize the plate method: 1/2 plate vegetables, 1/4 plate protein, 1/4 plate starch he feels that the reason why he cannot lose weight is because he "eats too much" and finishes whatever portion of food his wife puts on his plate    Personal Goal #2  Continue to make more nutritious snack options and only snack as needed between meal or in the evenings used to drink 1/2 gallon of whole milk + oreo cookies as a snack but has been working to choose options like PB + crackers instead and not snacking in the evenings    Personal Goal #3  When eating out, choose less fried / greasy food options. Order a side of vegetables or fruit to add fiber to the meal    Comments  His wife is very health-conscious and typically cooks nutritious meals at home; lean meat + 1-2 vegetables are always available. She does not cook with salt and reads nutrition labels for sodium and cholesterol content. He is very physically active outside of class      Intervention Plan   Intervention  Prescribe, educate and counsel regarding individualized specific dietary modifications aiming towards targeted core components such as weight, hypertension, lipid management, diabetes, heart failure and other comorbidities.    Expected Outcomes  Short Term Goal:  A plan has been developed with personal nutrition goals set during dietitian appointment.;Long Term Goal: Adherence to prescribed nutrition plan.;Short Term Goal: Understand basic principles of dietary content, such as calories, fat, sodium, cholesterol and nutrients.       Nutrition Assessments: Nutrition Assessments - 09/13/17 1434      MEDFICTS Scores   Pre Score  54       Nutrition Goals Re-Evaluation: Nutrition Goals Re-Evaluation    Row Name 09/27/17 0924  10/11/17 1030           Goals   Nutrition Goal  Monitor portion sizes more closely at meal times. Utilize the plate method: 1/2 plate vegetables, 1/4 plate protein, 1/4 plate starch  Monitor portion sizes more closely at meal times. Utilize the plate method: 1/2 plate vegetables, 1/4 plate protein, 1/4 plate starch, more fruits/vegetables, better snacks      Comment  He feels that the reason why he cannot lose weight is becuase he "eats too much" and will finish whatever portion of food his wife puts on his plate  Mathews has been trying to work on portion control.  He has been eating out less as well.  He has also eliminated his snacking as of now.  He is trying to stay away from fried/greasy foods.  He has not really focused on the plate method, just currently working on cutting back.       Expected Outcome  He will decrease portion sizes at meal times and opt for larger servings of vegetables and lean protein, and smaller servings of starch  Short: Continue to work on portion control.  Long: Continue to work on using plate method.         Personal Goal #2 Re-Evaluation   Personal Goal #2  When eating out, choose less fried/ greasy food options. Order a side of vegetables or fruit to add fiber to the meal, and continue to split meals with your wife  -        Personal Goal #3 Re-Evaluation   Personal Goal #3  Continue to make more nutritious snack options and to only snack as needed between meals or in the evenings  -         Nutrition Goals Discharge (Final Nutrition Goals Re-Evaluation): Nutrition Goals Re-Evaluation - 10/11/17 1030      Goals   Nutrition Goal  Monitor portion sizes more closely at meal times. Utilize the plate method: 1/2 plate vegetables, 1/4 plate protein, 1/4 plate starch, more fruits/vegetables, better snacks    Comment  Jaramiah has been trying to work on portion control.  He has been eating out less as well.  He has also eliminated his snacking as of now.  He is  trying to stay away from fried/greasy foods.  He has not really focused on the plate method, just currently working on cutting back.     Expected Outcome  Short: Continue to work on portion control.  Long: Continue to work on using plate method.        Psychosocial: Target Goals: Acknowledge presence or absence of significant depression and/or stress, maximize coping skills, provide positive support system. Participant is able to verbalize types and ability to use techniques and skills needed for reducing stress and depression.   Initial Review & Psychosocial Screening: Initial Psych Review & Screening - 09/13/17 1434      Initial Review   Current issues with  Current Stress Concerns    Source of Stress Concerns  Unable  to perform yard/household activities    Comments  He is ready to get back to his usual duties on his 4 acres. He is used to performing lots of yard work.       Family Dynamics   Good Support System?  Yes family      Barriers   Psychosocial barriers to participate in program  The patient should benefit from training in stress management and relaxation.;There are no identifiable barriers or psychosocial needs.      Screening Interventions   Interventions  Encouraged to exercise;Provide feedback about the scores to participant;Program counselor consult;To provide support and resources with identified psychosocial needs    Expected Outcomes  Short Term goal: Utilizing psychosocial counselor, staff and physician to assist with identification of specific Stressors or current issues interfering with healing process. Setting desired goal for each stressor or current issue identified.;Long Term Goal: Stressors or current issues are controlled or eliminated.;Short Term goal: Identification and review with participant of any Quality of Life or Depression concerns found by scoring the questionnaire.;Long Term goal: The participant improves quality of Life and PHQ9 Scores as seen by post  scores and/or verbalization of changes       Quality of Life Scores:  Quality of Life - 09/13/17 1440      Quality of Life Scores   Health/Function Pre  26.8 %    Socioeconomic Pre  30 %    Psych/Spiritual Pre  30 %    Family Pre  28.8 %    GLOBAL Pre  28.36 %      Scores of 19 and below usually indicate a poorer quality of life in these areas.  A difference of  2-3 points is a clinically meaningful difference.  A difference of 2-3 points in the total score of the Quality of Life Index has been associated with significant improvement in overall quality of life, self-image, physical symptoms, and general health in studies assessing change in quality of life.  PHQ-9: Recent Review Flowsheet Data    Depression screen [facility] 2/9 09/13/2017   Decreased Interest 0   Down, Depressed, Hopeless 0   PHQ - 2 Score 0   Altered sleeping 3   Tired, decreased energy 2   Change in appetite 0   Feeling bad or failure about yourself  0   Trouble concentrating 0   Moving slowly or fidgety/restless 0   Suicidal thoughts 0   PHQ-9 Score 5   Difficult doing work/chores Not difficult at all     Interpretation of Total Score  Total Score Depression Severity:  1-4 = Minimal depression, 5-9 = Mild depression, 10-14 = Moderate depression, 15-19 = Moderately severe depression, 20-27 = Severe depression   Psychosocial Evaluation and Intervention: Psychosocial Evaluation - 09/27/17 0945      Psychosocial Evaluation & Interventions   Interventions  Encouraged to exercise with the program and follow exercise prescription    Comments  Counselor met with Mr. Chausse Marrowstone) today for initial psychosocial evaluation.  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  Elliott has a strong support system with a spouse of 63 years; a daughter and son who live locally; and he is actively involved in his local church.  Shabazz reports some difficulty with interrupted sleep and he has a good  appetite.  He denies a history of depression or anxiety or any current symptoms and is typically in a positive mood.  Jeneen Rinks has minimal stress in his  life other than his health concerns.  He has goals to get back to his normal activities; increase his stamina and strength and lose some weight while in this program.  Staff will follow with him.    Expected Outcomes  Short:  Skylen will meet with the dietician to address his weight loss goals.   Long:  He will exercise consistently to increase his stamina and strength and to return to normal activities.      Continue Psychosocial Services   Follow up required by staff       Psychosocial Re-Evaluation: Psychosocial Re-Evaluation    [place] Name 10/11/17 1038             Psychosocial Re-Evaluation   Current issues with  Current Stress Concerns       Comments  Alwyn has been doing well in rehab.  He continues to do well mentally.  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 He continues to sleep better.  He is feeling better overall and is stronger than when he started.         Expected Outcomes  Short: Continue to attend rehab to build strength and stamina. Long: Continue to remain positive.        Interventions  Encouraged to attend Cardiac Rehabilitation for the exercise;Stress management education       Continue Psychosocial Services   Follow up required by staff          Psychosocial Discharge (Final Psychosocial Re-Evaluation): Psychosocial Re-Evaluation - 10/11/17 1038      Psychosocial Re-Evaluation   Current issues with  Current Stress Concerns    Comments  Friedrich has been doing well in rehab.  He continues to do well mentally.  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 He continues to sleep better.  He is feeling better overall and is stronger than when he started.      Expected Outcomes  Short: Continue to attend rehab to  build strength and stamina. Long: Continue to remain positive.     Interventions  Encouraged to attend Cardiac Rehabilitation for the exercise;Stress management education    Continue Psychosocial Services   Follow up required by staff       Vocational Rehabilitation: Provide vocational rehab assistance to qualifying candidates.   Vocational Rehab Evaluation & Intervention: Vocational Rehab - 09/13/17 1442      Initial Vocational Rehab Evaluation & Intervention   Assessment shows need for Vocational Rehabilitation  No       Education: Education Goals: Education classes will be provided on a variety of topics geared toward better understanding of heart health and risk factor modification. Participant will state understanding/return demonstration of topics presented as noted by education test scores.  Learning Barriers/Preferences: Learning Barriers/Preferences - 09/13/17 1441      Learning Barriers/Preferences   Learning Barriers  Hearing    Learning Preferences  None       Education Topics:  AED/CPR: - Group verbal and written instruction with the use of models to demonstrate the basic use of the AED with the basic ABC's of resuscitation.   General Nutrition Guidelines/Fats and Fiber: -Group instruction provided by verbal, written material, models and posters to present the general guidelines for heart healthy nutrition. Gives an explanation and review of dietary fats and fiber.   Controlling Sodium/Reading Food Labels: -Group verbal and written  material supporting the discussion of sodium use in heart healthy nutrition. Review and explanation with models, verbal and written materials for utilization of the food label.   Exercise Physiology & General Exercise Guidelines: - Group verbal and written instruction with models to review the exercise physiology of the cardiovascular system and associated critical values. Provides general exercise guidelines with specific  guidelines to those with heart or lung disease.    Aerobic Exercise & Resistance Training: - Gives group verbal and written instruction on the various components of exercise. Focuses on aerobic and resistive training programs and the benefits of this training and how to safely progress through these programs..   Cardiac Rehab from 10/18/2017 in [facility] Cardiac and Pulmonary Rehab  Date  09/18/17  Educator  AS  Instruction Review Code  1- Verbalizes Understanding      Flexibility, Balance, Mind/Body Relaxation: Provides group verbal/written instruction on the benefits of flexibility and balance training, including mind/body exercise modes such as yoga, pilates and tai chi.  Demonstration and skill practice provided.   Cardiac Rehab from 10/18/2017 in [facility] Cardiac and Pulmonary Rehab  Date  09/25/17  Educator  AS  Instruction Review Code  1- Verbalizes Understanding      Stress and Anxiety: - Provides group verbal and written instruction about the health risks of elevated stress and causes of high stress.  Discuss the correlation between heart/lung disease and anxiety and treatment options. Review healthy ways to manage with stress and anxiety.   Depression: - Provides group verbal and written instruction on the correlation between heart/lung disease and depressed mood, treatment options, and the stigmas associated with seeking treatment.   Cardiac Rehab from 10/18/2017 in [facility] Cardiac and Pulmonary Rehab  Date  09/20/17  Educator  [facility]  Instruction Review Code  1- Verbalizes Understanding      Anatomy & Physiology of the Heart: - Group verbal and written instruction and models provide basic cardiac anatomy and physiology, with the coronary electrical and arterial systems. Review of Valvular disease and Heart Failure   Cardiac Procedures: - Group verbal and written instruction to review commonly prescribed medications for heart disease. Reviews the medication, class of the drug, and side  effects. Includes the steps to properly store meds and maintain the prescription regimen. (beta blockers and nitrates)   Cardiac Rehab from 10/18/2017 in [facility] Cardiac and Pulmonary Rehab  Date  10/18/17  Educator  CE  Instruction Review Code  1- Verbalizes Understanding      Cardiac Medications I: - Group verbal and written instruction to review commonly prescribed medications for heart disease. Reviews the medication, class of the drug, and side effects. Includes the steps to properly store meds and maintain the prescription regimen.   Cardiac Rehab from 10/18/2017 in [facility] Cardiac and Pulmonary Rehab  Date  10/09/17  Educator  SB  Instruction Review Code  1- Verbalizes Understanding      Cardiac Medications II: -Group verbal and written instruction to review commonly prescribed medications for heart disease. Reviews the medication, class of the drug, and side effects. (all other drug classes)   Cardiac Rehab from 10/18/2017 in [facility] Cardiac and Pulmonary Rehab  Date  09/27/17  Educator  CE  Instruction Review Code  1- Verbalizes Understanding       Go Sex-Intimacy & Heart Disease, Get SMART - Goal Setting: - Group verbal and written instruction through game format to discuss heart disease and the return to sexual intimacy. Provides group verbal and written material to discuss  and apply goal setting through the application of the S.M.A.R.T. Method.   Cardiac Rehab from 10/18/2017 in [facility] Cardiac and Pulmonary Rehab  Date  10/18/17  Educator  CE  Instruction Review Code  1- Verbalizes Understanding      Other Matters of the Heart: - Provides group verbal, written materials and models to describe Stable Angina and Peripheral Artery. Includes description of the disease process and treatment options available to the cardiac patient.   Exercise & Equipment Safety: - Individual verbal instruction and demonstration of equipment use and safety with use of the equipment.   Cardiac Rehab  from 10/18/2017 in [facility] Cardiac and Pulmonary Rehab  Date  09/13/17  Educator  [facility]  Instruction Review Code  1- Verbalizes Understanding      Infection Prevention: - Provides verbal and written material to individual with discussion of infection control including proper hand washing and proper equipment cleaning during exercise session.   Cardiac Rehab from 10/18/2017 in [facility] Cardiac and Pulmonary Rehab  Date  09/13/17  Educator  [facility]  Instruction Review Code  1- Verbalizes Understanding      Falls Prevention: - Provides verbal and written material to individual with discussion of falls prevention and safety.   Cardiac Rehab from 10/18/2017 in [facility] Cardiac and Pulmonary Rehab  Date  09/13/17  Educator  [facility]  Instruction Review Code  1- Verbalizes Understanding      Diabetes: - Individual verbal and written instruction to review signs/symptoms of diabetes, desired ranges of glucose level fasting, after meals and with exercise. Acknowledge that pre and post exercise glucose checks will be done for 3 sessions at entry of program.   Know Your Numbers and Risk Factors: -Group verbal and written instruction about important numbers in your health.  Discussion of what are risk factors and how they play a role in the disease process.  Review of Cholesterol, Blood Pressure, Diabetes, and BMI and the role they play in your overall health.   Cardiac Rehab from 10/18/2017 in [facility] Cardiac and Pulmonary Rehab  Date  09/27/17  Educator  CE  Instruction Review Code  1- Verbalizes Understanding      Sleep Hygiene: -Provides group verbal and written instruction about how sleep can affect your health.  Define sleep hygiene, discuss sleep cycles and impact of sleep habits. Review good sleep hygiene tips.    Cardiac Rehab from 10/18/2017 in [facility] Cardiac and Pulmonary Rehab  Date  10/16/17  Educator  [facility]  Instruction Review Code  1- Verbalizes Understanding      Other: -Provides group and verbal  instruction on various topics (see comments)   Knowledge Questionnaire Score: Knowledge Questionnaire Score - 09/13/17 1441      Knowledge Questionnaire Score   Pre Score  23/28 correct answers reviewed with Hollice Espy        Core Components/Risk Factors/Patient Goals at Admission: Personal Goals and Risk Factors at Admission - 09/13/17 1433      Core Components/Risk Factors/Patient Goals on Admission    Weight Management  Yes;Weight Maintenance    Intervention  Weight Management: Develop a combined nutrition and exercise program designed to reach desired caloric intake, while maintaining appropriate intake of nutrient and fiber, sodium and fats, and appropriate energy expenditure required for the weight goal.;Weight Management: Provide education and appropriate resources to help participant work on and attain dietary goals.    Admit Weight  175 lb (79.4 kg)    Expected Outcomes  Short Term: Continue to assess and modify interventions  until short term weight is achieved;Long Term: Adherence to nutrition and physical activity/exercise program aimed toward attainment of established weight goal;Weight Maintenance: Understanding of the daily nutrition guidelines, which includes 25-35% calories from fat, 7% or less cal from saturated fats, less than 285m cholesterol, less than 1.5gm of sodium, & 5 or more servings of fruits and vegetables daily;Understanding recommendations for meals to include 15-35% energy as protein, 25-35% energy from fat, 35-60% energy from carbohydrates, less than 2063mof dietary cholesterol, 20-35 gm of total fiber daily;Understanding of distribution of calorie intake throughout the day with the consumption of 4-5 meals/snacks    Hypertension  Yes    Intervention  Provide education on lifestyle modifcations including regular physical activity/exercise, weight management, moderate sodium restriction and increased consumption of fresh fruit, vegetables, and low fat dairy, alcohol  moderation, and smoking cessation.;Monitor prescription use compliance.    Expected Outcomes  Short Term: Continued assessment and intervention until BP is < 140/9069mG in hypertensive participants. < 130/33m63m in hypertensive participants with diabetes, heart failure or chronic kidney disease.;Long Term: Maintenance of blood pressure at goal levels.    Lipids  Yes    Intervention  Provide education and support for participant on nutrition & aerobic/resistive exercise along with prescribed medications to achieve LDL <70mg13mL >40mg.77mExpected Outcomes  Short Term: Participant states understanding of desired cholesterol values and is compliant with medications prescribed. Participant is following exercise prescription and nutrition guidelines.;Long Term: Cholesterol controlled with medications as prescribed, with individualized exercise RX and with personalized nutrition plan. Value goals: LDL < 70mg, 23m> 40 mg.       Core Components/Risk Factors/Patient Goals Review:  Goals and Risk Factor Review    Row Name 10/11/17 1025             Core Components/Risk Factors/Patient Goals Review   Personal Goals Review  Weight Management/Obesity;Hypertension;Lipids       Review  LeonardElsieen doing well in rehab.  He is holding steady on his weight.  After meeting with dietician, he realized that he needs to work on portion control to help with his weight loss.  He lost 15 lbs in the hospital and is starting to gain it back.  His blood pressures have been good and run in the 120s at home when he checks them.  He is doing well on his medications.        Expected Outcomes  Short: Continue to work on weight loss.  Long: Continue to work on risk factors.           Core Components/Risk Factors/Patient Goals at Discharge (Final Review):  Goals and Risk Factor Review - 10/11/17 1025      Core Components/Risk Factors/Patient Goals Review   Personal Goals Review  Weight  Management/Obesity;Hypertension;Lipids    Review  LeonardZaydennen doing well in rehab.  He is holding steady on his weight.  After meeting with dietician, he realized that he needs to work on portion control to help with his weight loss.  He lost 15 lbs in the hospital and is starting to gain it back.  His blood pressures have been good and run in the 120s at home when he checks them.  He is doing well on his medications.     Expected Outcomes  Short: Continue to work on weight loss.  Long: Continue to work on risk factors.        ITP Comments: ITP Comments  [place] Name 09/13/17 1421 09/26/17 0546 10/24/17 0609       ITP Comments  Med Review completed. Initial ITP created. Diagnosis can be found in Care Everywhere 08/06/17  30 day review. Continue with ITP unless directed changes per Medical Director  New to program  30 day review. Continue with ITP unless directed changes per Medical Director        Comments:

## 2017-10-25 ENCOUNTER — Encounter: Payer: Medicare PPO | Admitting: *Deleted

## 2017-10-25 DIAGNOSIS — Z48812 Encounter for surgical aftercare following surgery on the circulatory system: Secondary | ICD-10-CM | POA: Diagnosis not present

## 2017-10-25 DIAGNOSIS — Z951 Presence of aortocoronary bypass graft: Secondary | ICD-10-CM

## 2017-10-25 NOTE — Progress Notes (Signed)
Daily Session Note  Patient Details  Name: Warren Ingram MRN: [id number] Date of Birth: 26-Oct-1940 Referring Provider:     Cardiac Rehab from 09/13/2017 in [facility] Cardiac and Pulmonary Rehab  Referring Provider  Ikonomidis      Encounter Date: 10/25/2017  Check In: Session Check In - 10/25/17 0929      Check-In   Location  [facility]-Cardiac & Pulmonary Rehab    Staff Present  Gerlene Burdock, RN, BSN;Meredith Sherryll Burger, RN BSN;Jessica Luan Pulling, MA, RCEP, CCRP, Exercise Physiologist    Supervising physician immediately available to respond to emergencies  See telemetry face sheet for immediately available ER MD    Medication changes reported      No    Fall or balance concerns reported     No    Warm-up and Cool-down  Performed on first and last piece of equipment    Resistance Training Performed  Yes    VAD Patient?  No      Pain Assessment   Currently in Pain?  No/denies          Social History   Tobacco Use  Smoking Status Former Smoker  . Packs/day: 1.00  . Last attempt to quit: 1985  . Years since quitting: 34.4  Smokeless Tobacco Never Used    Goals Met:  Independence with exercise equipment Exercise tolerated well No report of cardiac concerns or symptoms Strength training completed today  Goals Unmet:  Not Applicable  Comments: Pt able to follow exercise prescription today without complaint.  Will continue to monitor for progression.    Dr. Emily Filbert is Medical Director for [place] and [facility].

## 2017-10-30 DIAGNOSIS — Z951 Presence of aortocoronary bypass graft: Secondary | ICD-10-CM

## 2017-10-30 DIAGNOSIS — Z48812 Encounter for surgical aftercare following surgery on the circulatory system: Secondary | ICD-10-CM | POA: Diagnosis not present

## 2017-10-30 NOTE — Progress Notes (Signed)
Daily Session Note  Patient Details  Name: Warren Ingram MRN: [id number] Date of Birth: [birth year] Referring Provider:     Cardiac Rehab from 09/13/2017 in [facility] Cardiac and Pulmonary Rehab  Referring Provider  Ikonomidis      Encounter Date: 10/30/2017  Check In:      Social History   Tobacco Use  Smoking Status Former Smoker  . Packs/day: 1.00  . Last attempt to quit: 1985  . Years since quitting: 34.4  Smokeless Tobacco Never Used    Goals Met:  Independence with exercise equipment Exercise tolerated well No report of cardiac concerns or symptoms Strength training completed today  Goals Unmet:  Not Applicable  Comments: Pt able to follow exercise prescription today without complaint.  Will continue to monitor for progression.    Dr. Emily Filbert is Medical Director for [place] and [facility].

## 2017-11-01 DIAGNOSIS — Z48812 Encounter for surgical aftercare following surgery on the circulatory system: Secondary | ICD-10-CM | POA: Diagnosis not present

## 2017-11-01 DIAGNOSIS — Z951 Presence of aortocoronary bypass graft: Secondary | ICD-10-CM

## 2017-11-01 NOTE — Progress Notes (Signed)
Daily Session Note  Patient Details  Name: Warren Ingram MRN: [id number] Date of Birth: Mar 04, 1941 Referring Provider:     Cardiac Rehab from 09/13/2017 in [facility] Cardiac and Pulmonary Rehab  Referring Provider  Ikonomidis      Encounter Date: 11/01/2017  Check In: Session Check In - 11/01/17 1017      Check-In   Location  [facility]-Cardiac & Pulmonary Rehab    Staff Present  Gerlene Burdock, RN, BSN;Jessica Luan Pulling, MA, RCEP, CCRP, Exercise Physiologist;Aviraj Kentner Oletta Darter, [place], ACSM CEP, Exercise Physiologist    Supervising physician immediately available to respond to emergencies  See telemetry face sheet for immediately available ER MD    Medication changes reported      No    Fall or balance concerns reported     No    Warm-up and Cool-down  Performed on first and last piece of equipment    Resistance Training Performed  Yes    VAD Patient?  No      Pain Assessment   Currently in Pain?  No/denies    Multiple Pain Sites  No          Social History   Tobacco Use  Smoking Status Former Smoker  . Packs/day: 1.00  . Last attempt to quit: 1985  . Years since quitting: 34.4  Smokeless Tobacco Never Used    Goals Met:  Independence with exercise equipment Exercise tolerated well No report of cardiac concerns or symptoms Strength training completed today  Goals Unmet:  Not Applicable  Comments: Pt able to follow exercise prescription today without complaint.  Will continue to monitor for progression.    Dr. Emily Filbert is Medical Director for [place] and [facility].

## 2017-11-06 ENCOUNTER — Encounter: Payer: Medicare PPO | Attending: Surgery

## 2017-11-06 DIAGNOSIS — Z48812 Encounter for surgical aftercare following surgery on the circulatory system: Secondary | ICD-10-CM | POA: Diagnosis present

## 2017-11-06 DIAGNOSIS — Z951 Presence of aortocoronary bypass graft: Secondary | ICD-10-CM | POA: Diagnosis not present

## 2017-11-06 NOTE — Progress Notes (Signed)
Daily Session Note  Patient Details  Name: Warren Ingram MRN: [id number] Date of Birth: Jul 26, 1940 Referring Provider:     Cardiac Rehab from 09/13/2017 in [facility] Cardiac and Pulmonary Rehab  Referring Provider  Ikonomidis      Encounter Date: 11/06/2017  Check In: Session Check In - 11/06/17 0847      Check-In   Location  [facility]-Cardiac & Pulmonary Rehab    Staff Present  Justin Mend RCP,RRT,BSRT;Heath Lark, RN, BSN, Lance Sell, BA, ACSM CEP, Exercise Physiologist    Supervising physician immediately available to respond to emergencies  See telemetry face sheet for immediately available ER MD    Medication changes reported      No    Fall or balance concerns reported     No    Tobacco Cessation  No Change    Warm-up and Cool-down  Performed on first and last piece of equipment    Resistance Training Performed  Yes    VAD Patient?  No      Pain Assessment   Currently in Pain?  No/denies          Social History   Tobacco Use  Smoking Status Former Smoker  . Packs/day: 1.00  . Last attempt to quit: 1985  . Years since quitting: 34.4  Smokeless Tobacco Never Used    Goals Met:  Independence with exercise equipment Exercise tolerated well No report of cardiac concerns or symptoms Strength training completed today  Goals Unmet:  Not Applicable  Comments: Pt able to follow exercise prescription today without complaint.  Will continue to monitor for progression.   Dr. Emily Filbert is Medical Director for [place] and [facility].

## 2017-11-13 ENCOUNTER — Encounter: Payer: Medicare PPO | Admitting: *Deleted

## 2017-11-13 VITALS — Ht 65.0 in | Wt 179.0 lb

## 2017-11-13 DIAGNOSIS — Z951 Presence of aortocoronary bypass graft: Secondary | ICD-10-CM

## 2017-11-13 DIAGNOSIS — Z48812 Encounter for surgical aftercare following surgery on the circulatory system: Secondary | ICD-10-CM | POA: Diagnosis not present

## 2017-11-13 NOTE — Progress Notes (Signed)
Daily Session Note  Patient Details  Name: Warren Ingram MRN: [id number] Date of Birth: [birth year] Referring Provider:     Cardiac Rehab from 09/13/2017 in [facility] Cardiac and Pulmonary Rehab  Referring Provider  Ikonomidis      Encounter Date: 11/13/2017  Check In: Session Check In - 11/13/17 0851      Check-In   Location  [facility]-Cardiac & Pulmonary Rehab    Staff Present  Joellyn Rued, BS, PEC;Susanne Bice, RN, BSN, CCRP;Sybilla Malhotra [place], [place], RCEP, CCRP, Exercise Physiologist;Amanda Oletta Darter, [place], ACSM CEP, Exercise Physiologist    Supervising physician immediately available to respond to emergencies  See telemetry face sheet for immediately available ER MD    Medication changes reported      No    Fall or balance concerns reported     No    Warm-up and Cool-down  Performed on first and last piece of equipment    Resistance Training Performed  Yes    VAD Patient?  No      Pain Assessment   Currently in Pain?  No/denies    Multiple Pain Sites  No          Social History   Tobacco Use  Smoking Status Former Smoker  . Packs/day: 1.00  . Last attempt to quit: 1985  . Years since quitting: 34.4  Smokeless Tobacco Never Used    Goals Met:  Independence with exercise equipment Exercise tolerated well Personal goals reviewed No report of cardiac concerns or symptoms Strength training completed today  Goals Unmet:  Not Applicable  Comments: Pt able to follow exercise prescription today without complaint.  Will continue to monitor for progression. See ITP for goal review 6 Minute Walk    Row Name 09/13/17 1452 11/13/17 0849       6 Minute Walk   Phase  -  Discharge    Distance  1142 feet  1430 feet    Distance Feet Change  -  288 ft    Walk Time  6 minutes  6 minutes    # of Rest Breaks  0  0    MPH  2.16  2.7    METS  2.37  2.45    RPE  12  11    Perceived Dyspnea   1  1    VO2 Peak  8.3  8.5    Symptoms  No  No    Resting HR  61 bpm  58 bpm    Resting  BP  134/80  126/68    Resting Oxygen Saturation   97 %  99 %    Exercise Oxygen Saturation  during 6 min walk  99 %  98 %    Max Ex. HR  93 bpm  69 bpm    Max Ex. BP  164/70  144/62    2 Minute Post BP  132/66  124/70         Dr. Emily Filbert is Medical Director for [place] and [facility].

## 2017-11-15 DIAGNOSIS — Z951 Presence of aortocoronary bypass graft: Secondary | ICD-10-CM

## 2017-11-15 DIAGNOSIS — Z48812 Encounter for surgical aftercare following surgery on the circulatory system: Secondary | ICD-10-CM | POA: Diagnosis not present

## 2017-11-15 NOTE — Progress Notes (Signed)
Daily Session Note  Patient Details  Name: Warren Ingram MRN: [id number] Date of Birth: April 17, 1941 Referring Provider:     Cardiac Rehab from 09/13/2017 in [facility] Cardiac and Pulmonary Rehab  Referring Provider  Ikonomidis      Encounter Date: 11/15/2017  Check In: Session Check In - 11/15/17 0840      Check-In   Location  [facility]-Cardiac & Pulmonary Rehab    Staff Present  Gerlene Burdock, RN, BSN;Jessica Luan Pulling, MA, RCEP, CCRP, Exercise Physiologist;Amanda Oletta Darter, [place], ACSM CEP, Exercise Physiologist    Supervising physician immediately available to respond to emergencies  See telemetry face sheet for immediately available ER MD    Medication changes reported      No    Fall or balance concerns reported     No    Warm-up and Cool-down  Performed on first and last piece of equipment    Resistance Training Performed  Yes    VAD Patient?  No      Pain Assessment   Currently in Pain?  No/denies          Social History   Tobacco Use  Smoking Status Former Smoker  . Packs/day: 1.00  . Last attempt to quit: 1985  . Years since quitting: 34.4  Smokeless Tobacco Never Used    Goals Met:  Independence with exercise equipment Exercise tolerated well No report of cardiac concerns or symptoms Strength training completed today  Goals Unmet:  Not Applicable  Comments: Pt able to follow exercise prescription today without complaint.  Will continue to monitor for progression.    Dr. Emily Filbert is Medical Director for [place] and [facility].

## 2017-11-20 DIAGNOSIS — Z951 Presence of aortocoronary bypass graft: Secondary | ICD-10-CM

## 2017-11-20 DIAGNOSIS — Z48812 Encounter for surgical aftercare following surgery on the circulatory system: Secondary | ICD-10-CM | POA: Diagnosis not present

## 2017-11-20 NOTE — Progress Notes (Signed)
Daily Session Note  Patient Details  Name: Warren Ingram MRN: [id number] Date of Birth: 11/27/1940 Referring Provider:     Cardiac Rehab from 09/13/2017 in [facility]  Referring Provider  Ikonomidis      Encounter Date: 11/20/2017  Check In: Session Check In - 11/20/17 0834      Check-In   Location  [facility]    Staff Present  Susanne Bice, RN, BSN, CCRP;Jessica Hawkins, MA, RCEP, CCRP, Exercise Physiologist;Amanda Sommer, BA, ACSM CEP, Exercise Physiologist    Supervising physician immediately available to respond to emergencies  See telemetry face sheet for immediately available ER MD    Medication changes reported      No    Fall or balance concerns reported     No    Warm-up and Cool-down  Performed on first and last piece of equipment    Resistance Training Performed  Yes    VAD Patient?  No      Pain Assessment   Currently in Pain?  No/denies    Multiple Pain Sites  No          Social History   Tobacco Use  Smoking Status Former Smoker  . Packs/day: 1.00  . Last attempt to quit: 1985  . Years since quitting: 34.4  Smokeless Tobacco Never Used    Goals Met:  Independence with exercise equipment Exercise tolerated well No report of cardiac concerns or symptoms Strength training completed today  Goals Unmet:  Not Applicable  Comments: Pt able to follow exercise prescription today without complaint.  Will continue to monitor for progression.    Dr. Mark Miller is Medical Director for [facility] and [facility]. 

## 2017-11-21 ENCOUNTER — Encounter: Payer: Self-pay | Admitting: *Deleted

## 2017-11-21 DIAGNOSIS — Z951 Presence of aortocoronary bypass graft: Secondary | ICD-10-CM

## 2017-11-21 NOTE — Progress Notes (Signed)
Cardiac Individual Treatment Plan  Patient Details  Name: Warren Ingram MRN: [id number] Date of Birth: 1940/09/16 Referring Provider:     Cardiac Rehab from 09/13/2017 in [facility] Cardiac and Pulmonary Rehab  Referring Provider  Ikonomidis      Initial Encounter Date:    Cardiac Rehab from 09/13/2017 in [facility] Cardiac and Pulmonary Rehab  Date  09/13/17  Referring Provider  Ikonomidis      Visit Diagnosis: S/P CABG x 4  Patient's Home Medications on Admission:  Current Outpatient Medications:  .  acetaminophen (TYLENOL) 500 MG tablet, Take 500 mg by mouth every 8 (eight) hours as needed for mild pain or headache., Disp: , Rfl:  .  amiodarone (PACERONE) 200 MG tablet, , Disp: , Rfl:  .  aspirin (ASPIRIN EC) 81 MG EC tablet, Take 81 mg by mouth daily. Swallow whole., Disp: , Rfl:  .  atorvastatin (LIPITOR) 80 MG tablet, Take 80 mg by mouth daily., Disp: , Rfl:  .  calcium carbonate (TUMS - DOSED IN MG ELEMENTAL CALCIUM) 500 MG chewable tablet, Chew 2 tablets by mouth 2 (two) times daily as needed for indigestion or heartburn., Disp: , Rfl:  .  colchicine (COLCRYS) 0.6 MG tablet, , Disp: , Rfl:  .  ELIQUIS 5 MG TABS tablet, , Disp: , Rfl:  .  fluticasone (FLONASE) 50 MCG/ACT nasal spray, Place 2 sprays into both nostrils daily., Disp: , Rfl:  .  furosemide (LASIX) 40 MG tablet, , Disp: , Rfl:  .  gemfibrozil (LOPID) 600 MG tablet, Take 600 mg by mouth 2 (two) times daily before a meal., Disp: , Rfl:  .  isosorbide mononitrate (IMDUR) 60 MG 24 hr tablet, Take 60 mg by mouth 2 (two) times daily., Disp: , Rfl:  .  levothyroxine (SYNTHROID, LEVOTHROID) 150 MCG tablet, Take 150 mcg by mouth daily before breakfast., Disp: , Rfl:  .  metoprolol tartrate (LOPRESSOR) 25 MG tablet, , Disp: , Rfl:  .  Omega-3 Fatty Acids (FISH OIL) 1000 MG CAPS, Take 2,000 mg by mouth daily. , Disp: , Rfl:  .  omeprazole (PRILOSEC) 20 MG capsule, Take 20 mg by mouth daily., Disp: , Rfl:  .  sildenafil (VIAGRA)  100 MG tablet, Take by mouth., Disp: , Rfl:  .  tamsulosin (FLOMAX) 0.4 MG CAPS capsule, , Disp: , Rfl:   Past Medical History: Past Medical History:  Diagnosis Date  . Arthritis   . Barrett esophagus   . Benign neoplasm of abdomen   . Benign neoplasm of colon   . Coronary artery disease   . GERD (gastroesophageal reflux disease)   . Hypercholesterolemia   . Hypothyroidism   . Stroke [facility])     Tobacco Use: Social History   Tobacco Use  Smoking Status Former Smoker  . Packs/day: 1.00  . Last attempt to quit: 1985  . Years since quitting: 34.4  Smokeless Tobacco Never Used    Labs: Recent Review Flowsheet Data    There is no flowsheet data to display.       Exercise Target Goals:    Exercise Program Goal: Individual exercise prescription set using results from initial 6 min walk test and THRR while considering  patient's activity barriers and safety.   Exercise Prescription Goal: Initial exercise prescription builds to 30-45 minutes a day of aerobic activity, 2-3 days per week.  Home exercise guidelines will be given to patient during program as part of exercise prescription that the participant will acknowledge.  Activity Barriers &  Risk Stratification: Activity Barriers & Cardiac Risk Stratification - 09/13/17 1443      Activity Barriers & Cardiac Risk Stratification   Activity Barriers  None    Cardiac Risk Stratification  Moderate       6 Minute Walk: 6 Minute Walk    Row Name 09/13/17 1452 11/13/17 0849       6 Minute Walk   Phase  -  Discharge    Distance  1142 feet  1430 feet    Distance Feet Change  -  288 ft    Walk Time  6 minutes  6 minutes    # of Rest Breaks  0  0    MPH  2.16  2.7    METS  2.37  2.45    RPE  12  11    Perceived Dyspnea   1  1    VO2 Peak  8.3  8.5    Symptoms  No  No    Resting HR  61 bpm  58 bpm    Resting BP  134/80  126/68    Resting Oxygen Saturation   97 %  99 %    Exercise Oxygen Saturation  during 6 min walk   99 %  98 %    Max Ex. HR  93 bpm  69 bpm    Max Ex. BP  164/70  144/62    2 Minute Post BP  132/66  124/70       Oxygen Initial Assessment:   Oxygen Re-Evaluation:   Oxygen Discharge (Final Oxygen Re-Evaluation):   Initial Exercise Prescription: Initial Exercise Prescription - 09/13/17 1400      Date of Initial Exercise RX and Referring Provider   Date  09/13/17    Referring Provider  Ikonomidis      Treadmill   MPH  1.8    Grade  0    Minutes  15    METs  2.5      Recumbant Bike   Level  3    RPM  60    Watts  10    Minutes  15    METs  2.5      Arm Ergometer   Level  1    RPM  40    Minutes  15    METs  2.5      T5 Nustep   Level  1    SPM  80    Minutes  15    METs  2.5      Prescription Details   Frequency (times per week)  3    Duration  Progress to 30 minutes of continuous aerobic without signs/symptoms of physical distress      Intensity   THRR 40-80% of Max Heartrate  95-128    Ratings of Perceived Exertion  11-13    Perceived Dyspnea  0-4      Resistance Training   Training Prescription  Yes    Weight  3 lb    Reps  10-15       Perform Capillary Blood Glucose checks as needed.  Exercise Prescription Changes: Exercise Prescription Changes    Row Name 09/13/17 1400 09/18/17 1500 09/20/17 0900 10/02/17 1500 10/16/17 1300     Response to Exercise   Blood Pressure (Admit)  134/80  126/64  -  128/60  120/60   Blood Pressure (Exercise)  164/70  132/64  -  124/72  122/64   Blood Pressure (Exit)  132/66  112/58  -  124/70  130/66   Heart Rate (Admit)  67 bpm  84 bpm  -  57 bpm  74 bpm   Heart Rate (Exercise)  93 bpm  93 bpm  -  97 bpm  106 bpm   Heart Rate (Exit)  66 bpm  58 bpm  -  62 bpm  58 bpm   Oxygen Saturation (Admit)  97 %  -  -  -  -   Rating of Perceived Exertion (Exercise)  12  13  -  11  13   Symptoms  -  none  -  none  none   Comments  -  first full day of exercise  -  -  -   Duration  -  Continue with 30 min of aerobic  exercise without signs/symptoms of physical distress.  -  Continue with 30 min of aerobic exercise without signs/symptoms of physical distress.  Continue with 30 min of aerobic exercise without signs/symptoms of physical distress.   Intensity  -  THRR unchanged  -  THRR unchanged  THRR unchanged     Progression   Progression  -  Continue to progress workloads to maintain intensity without signs/symptoms of physical distress.  -  Continue to progress workloads to maintain intensity without signs/symptoms of physical distress.  Continue to progress workloads to maintain intensity without signs/symptoms of physical distress.   Average METs  -  2.14  -  2.36  2.49     Resistance Training   Training Prescription  -  Yes  -  Yes  Yes   Weight  -  3 lbs  -  3 lbs  3 lbs   Reps  -  10-15  -  10-15  10-15     Interval Training   Interval Training  -  No  -  No  No     Treadmill   MPH  -  -  -  1.8  1.8   Grade  -  -  -  0  0   Minutes  -  -  -  15  15   METs  -  -  -  2.38  2.38     Recumbant Bike   Level  -  1  -  7  9   Watts  -  -  -  36  34   Minutes  -  15  -  15  15   METs  -  2.74  -  3.3  3.38     Arm Ergometer   Level  -  1  -  1  3   Minutes  -  15  -  15  15   METs  -  1.5  -  1.4  1.7     Home Exercise Plan   Plans to continue exercise at  -  -  [employer] walk at home  [employer] walk at home  [employer] walk at home   Frequency  -  -  Add 2 additional days to program exercise sessions.  Add 2 additional days to program exercise sessions.  Add 2 additional days to program exercise sessions.   Initial Home Exercises Provided  -  -  09/20/17  09/20/17  09/20/17   Row Name 10/30/17 1500 11/13/17 1500           Response to Exercise   Blood Pressure (Admit)  126/60  126/62  Blood Pressure (Exercise)  128/64  144/62      Blood Pressure (Exit)  134/70  120/68      Heart Rate (Admit)  61 bpm  72 bpm      Heart Rate (Exercise)  93 bpm  87 bpm      Heart Rate (Exit)   59 bpm  51 bpm      Rating of Perceived Exertion (Exercise)  13  13      Symptoms  none  none      Duration  Continue with 30 min of aerobic exercise without signs/symptoms of physical distress.  Continue with 30 min of aerobic exercise without signs/symptoms of physical distress.      Intensity  THRR unchanged  THRR unchanged        Progression   Progression  Continue to progress workloads to maintain intensity without signs/symptoms of physical distress.  Continue to progress workloads to maintain intensity without signs/symptoms of physical distress.      Average METs  2.42  2.38        Resistance Training   Training Prescription  Yes  Yes      Weight  3 lbs  3 lbs      Reps  10-15  10-15        Interval Training   Interval Training  No  No        Treadmill   MPH  1.8  1.8      Grade  0  0      Minutes  15  15      METs  2.38  2.38        Recumbant Bike   Level  9  9      Watts  36  33      Minutes  15  15      METs  3.38  3.31        Arm Ergometer   Level  3  3      Minutes  15  15      METs  1.5  1.5        Home Exercise Plan   Plans to continue exercise at  [employer] walk at home  [facility] walk at home      Frequency  Add 2 additional days to program exercise sessions.  Add 2 additional days to program exercise sessions.      Initial Home Exercises Provided  09/20/17  09/20/17         Exercise Comments: Exercise Comments    Row Name 09/18/17 1020 09/20/17 0906         Exercise Comments   First full day of exercise!  Patient was oriented to gym and equipment including functions, settings, policies, and procedures.  Patient's individual exercise prescription and treatment plan were reviewed.  All starting workloads were established based on the results of the 6 minute walk test done at initial orientation visit.  The plan for exercise progression was also introduced and progression will be customized based on patient's performance and goals.   Reviewed home  exercise with pt today.  Pt plans to attend [facility] and walk at home for exercise.  Reviewed THR, pulse, RPE, sign and symptoms, NTG use, and when to call 911 or MD.  Also discussed weather considerations and indoor options.  Pt voiced understanding.         Exercise Goals and Review: Exercise Goals    Row Name  09/13/17 1452             Exercise Goals   Increase Physical Activity  Yes       Intervention  Provide advice, education, support and counseling about physical activity/exercise needs.;Develop an individualized exercise prescription for aerobic and resistive training based on initial evaluation findings, risk stratification, comorbidities and participant's personal goals.       Expected Outcomes  Short Term: Attend rehab on a regular basis to increase amount of physical activity.;Long Term: Add in home exercise to make exercise part of routine and to increase amount of physical activity.;Long Term: Exercising regularly at least 3-5 days a week.       Increase Strength and Stamina  Yes       Intervention  Provide advice, education, support and counseling about physical activity/exercise needs.;Develop an individualized exercise prescription for aerobic and resistive training based on initial evaluation findings, risk stratification, comorbidities and participant's personal goals.       Expected Outcomes  Short Term: Increase workloads from initial exercise prescription for resistance, speed, and METs.;Long Term: Improve cardiorespiratory fitness, muscular endurance and strength as measured by increased METs and functional capacity (6MWT);Short Term: Perform resistance training exercises routinely during rehab and add in resistance training at home       Able to understand and use rate of perceived exertion (RPE) scale  Yes       Intervention  Provide education and explanation on how to use RPE scale       Expected Outcomes  Short Term: Able to use RPE daily in rehab to express  subjective intensity level;Long Term:  Able to use RPE to guide intensity level when exercising independently       Able to understand and use Dyspnea scale  Yes       Intervention  Provide education and explanation on how to use Dyspnea scale       Expected Outcomes  Short Term: Able to use Dyspnea scale daily in rehab to express subjective sense of shortness of breath during exertion;Long Term: Able to use Dyspnea scale to guide intensity level when exercising independently       Knowledge and understanding of Target Heart Rate Range (THRR)  Yes       Intervention  Provide education and explanation of THRR including how the numbers were predicted and where they are located for reference       Expected Outcomes  Short Term: Able to state/look up THRR;Short Term: Able to use daily as guideline for intensity in rehab;Long Term: Able to use THRR to govern intensity when exercising independently       Able to check pulse independently  Yes       Intervention  Provide education and demonstration on how to check pulse in carotid and radial arteries.;Review the importance of being able to check your own pulse for safety during independent exercise       Expected Outcomes  Short Term: Able to explain why pulse checking is important during independent exercise;Long Term: Able to check pulse independently and accurately       Understanding of Exercise Prescription  Yes       Intervention  Provide education, explanation, and written materials on patient's individual exercise prescription       Expected Outcomes  Short Term: Able to explain program exercise prescription;Long Term: Able to explain home exercise prescription to exercise independently          Exercise Goals Re-Evaluation : Exercise  Goals Re-Evaluation    Row Name 09/18/17 1020 10/02/17 1542 10/11/17 1023 10/16/17 1348 10/30/17 1515     Exercise Goal Re-Evaluation   Exercise Goals Review  Able to understand and use rate of perceived exertion  (RPE) scale;Knowledge and understanding of Target Heart Rate Range (THRR);Understanding of Exercise Prescription;Increase Physical Activity  Increase Physical Activity;Understanding of Exercise Prescription;Increase Strength and Stamina  Increase Physical Activity;Understanding of Exercise Prescription;Increase Strength and Stamina  Increase Physical Activity;Understanding of Exercise Prescription;Increase Strength and Stamina  Increase Physical Activity;Understanding of Exercise Prescription;Increase Strength and Stamina   Comments  Reviewed RPE scale, THR and program prescription with pt today.  Pt voiced understanding and was given a copy of goals to take home.   Kanishk has been doing well in rehab.  He is now up to level 7 on the recumbent bike and 36 watts.  We will continue to monitor his progression.   Johnston is doing well in rehab.  He is starting to feel stronger and has been able to start getting back to some of his normal activities.   He would like to continue to build his strength back up. Reviewed home exercise with pt today.  Pt plans to walk at home for exercise.  Reviewed THR, pulse, RPE, sign and symptoms, and when to call 911 or MD.  Also discussed weather considerations and indoor options.  Pt voiced understanding.  Jasir continues to do well in rehab.  He is up to 34 watts on the recumbent bike now.  We will try to add some incline to his treadmill.  We will continue to monitor his progress.   Jonhatan has been doing well in rehab.  He was out sick last week. He is up to level 3 on the arm crank.  We will continue to monitor his progress.    Expected Outcomes  Short: Use RPE daily to regulate intensity.  Long: Follow program prescription in THR.  Short: Increase workload on arm crank.  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   Short: Add in home exercise consistently.  Long: Continue to increase strength and stamina.   Short: Add incline to treadmill.  Long: Continue to  exercise more at home.   Short: Continue to try to increase treadmill.  Long: Continue to work on adding in more strength and stamina.    [place] Name 11/13/17 1500             Exercise Goal Re-Evaluation   Exercise Goals Review  Increase Physical Activity;Understanding of Exercise Prescription;Increase Strength and Stamina       Comments  Raynor continues to do well in rehab.  He is now on level 9 on the recumbent bike and completed his post 6MWT today.  He improved by 288 ft!!  We will continue to monitor his progress towards graduation.        Expected Outcomes  Short: Continue to get closer to graduation.  Long: Continue to exercise on his off days.          Discharge Exercise Prescription (Final Exercise Prescription Changes): Exercise Prescription Changes - 11/13/17 1500      Response to Exercise   Blood Pressure (Admit)  126/62    Blood Pressure (Exercise)  144/62    Blood Pressure (Exit)  120/68    Heart Rate (Admit)  72 bpm    Heart Rate (Exercise)  87 bpm    Heart Rate (Exit)  51 bpm    Rating of Perceived Exertion (  Exercise)  13    Symptoms  none    Duration  Continue with 30 min of aerobic exercise without signs/symptoms of physical distress.    Intensity  THRR unchanged      Progression   Progression  Continue to progress workloads to maintain intensity without signs/symptoms of physical distress.    Average METs  2.38      Resistance Training   Training Prescription  Yes    Weight  3 lbs    Reps  10-15      Interval Training   Interval Training  No      Treadmill   MPH  1.8    Grade  0    Minutes  15    METs  2.38      Recumbant Bike   Level  9    Watts  33    Minutes  15    METs  3.31      Arm Ergometer   Level  3    Minutes  15    METs  1.5      Home Exercise Plan   Plans to continue exercise at  [employer] walk at home    Frequency  Add 2 additional days to program exercise sessions.    Initial Home Exercises Provided  09/20/17        Nutrition:  Target Goals: Understanding of nutrition guidelines, daily intake of sodium <1576m, cholesterol <2034m calories 30% from fat and 7% or less from saturated fats, daily to have 5 or more servings of fruits and vegetables.  Biometrics: Pre Biometrics - 09/13/17 1452      Pre Biometrics   Height  5' 5" (1.651 m)    Weight  175 lb 12.8 oz (79.7 kg)    Waist Circumference  39 inches    Hip Circumference  39 inches    Waist to Hip Ratio  1 %    BMI (Calculated)  29.25    Single Leg Stand  12.51 seconds      Post Biometrics - 11/13/17 0904       Post  Biometrics   Height  5' 5" (1.651 m)    Weight  179 lb (81.2 kg)    Waist Circumference  37.5 inches    Hip Circumference  38 inches    Waist to Hip Ratio  0.99 %    BMI (Calculated)  29.79    Single Leg Stand  14.5 seconds       Nutrition Therapy Plan and Nutrition Goals: Nutrition Therapy & Goals - 09/27/17 0916      Nutrition Therapy   Diet  DASH    Drug/Food Interactions  Statins/Certain Fruits    Protein (specify units)  9oz    Fiber  30 grams    Whole Grain Foods  3 servings    Saturated Fats  15 max. grams    Fruits and Vegetables  6 servings/day 8 ideal    Sodium  2000 grams      Personal Nutrition Goals   Nutrition Goal  Monitor portion sizes more closely at meal times. Utilize the plate method: 1/2 plate vegetables, 1/4 plate protein, 1/4 plate starch he feels that the reason why he cannot lose weight is because he "eats too much" and finishes whatever portion of food his wife puts on his plate    Personal Goal #2  Continue to make more nutritious snack options and only snack as needed between meal or in the  evenings used to drink 1/2 gallon of whole milk + oreo cookies as a snack but has been working to choose options like PB + crackers instead and not snacking in the evenings    Personal Goal #3  When eating out, choose less fried / greasy food options. Order a side of vegetables or fruit to add fiber  to the meal    Comments  His wife is very health-conscious and typically cooks nutritious meals at home; lean meat + 1-2 vegetables are always available. She does not cook with salt and reads nutrition labels for sodium and cholesterol content. He is very physically active outside of class      Intervention Plan   Intervention  Prescribe, educate and counsel regarding individualized specific dietary modifications aiming towards targeted core components such as weight, hypertension, lipid management, diabetes, heart failure and other comorbidities.    Expected Outcomes  Short Term Goal: A plan has been developed with personal nutrition goals set during dietitian appointment.;Long Term Goal: Adherence to prescribed nutrition plan.;Short Term Goal: Understand basic principles of dietary content, such as calories, fat, sodium, cholesterol and nutrients.       Nutrition Assessments: Nutrition Assessments - 11/20/17 1131      MEDFICTS Scores   Pre Score  54    Post Score  49    Score Difference  -5       Nutrition Goals Re-Evaluation: Nutrition Goals Re-Evaluation    Row Name 09/27/17 0924 10/11/17 1030 11/13/17 0841         Goals   Nutrition Goal  Monitor portion sizes more closely at meal times. Utilize the plate method: 1/2 plate vegetables, 1/4 plate protein, 1/4 plate starch  Monitor portion sizes more closely at meal times. Utilize the plate method: 1/2 plate vegetables, 1/4 plate protein, 1/4 plate starch, more fruits/vegetables, better snacks  -     Comment  He feels that the reason why he cannot lose weight is becuase he "eats too much" and will finish whatever portion of food his wife puts on his plate  Kaliel has been trying to work on portion control.  He has been eating out less as well.  He has also eliminated his snacking as of now.  He is trying to stay away from fried/greasy foods.  He has not really focused on the plate method, just currently working on cutting back.   Hassell  would like to lose 4 lb.  He is still watching portion sizes.  He has Barretts esophagus and may have to have another procedure.         Expected Outcome  He will decrease portion sizes at meal times and opt for larger servings of vegetables and lean protein, and smaller servings of starch  Short: Continue to work on portion control.  Long: Continue to work on using plate method.   Short - Donye will continue to monitor portions Long - Jorian will maintain weight at desired level       Personal Goal #2 Re-Evaluation   Personal Goal #2  When eating out, choose less fried/ greasy food options. Order a side of vegetables or fruit to add fiber to the meal, and continue to split meals with your wife  -  -       Personal Goal #3 Re-Evaluation   Personal Goal #3  Continue to make more nutritious snack options and to only snack as needed between meals or in the evenings  -  -  Nutrition Goals Discharge (Final Nutrition Goals Re-Evaluation): Nutrition Goals Re-Evaluation - 11/13/17 0841      Goals   Comment  Marquie would like to lose 4 lb.  He is still watching portion sizes.  He has Barretts esophagus and may have to have another procedure.        Expected Outcome  Short - Marquell will continue to monitor portions Long - Delvonte will maintain weight at desired level       Psychosocial: Target Goals: Acknowledge presence or absence of significant depression and/or stress, maximize coping skills, provide positive support system. Participant is able to verbalize types and ability to use techniques and skills needed for reducing stress and depression.   Initial Review & Psychosocial Screening: Initial Psych Review & Screening - 09/13/17 1434      Initial Review   Current issues with  Current Stress Concerns    Source of Stress Concerns  Unable to perform yard/household activities    Comments  He is ready to get back to his usual duties on his 4 acres. He is used to performing lots of yard  work.       Family Dynamics   Good Support System?  Yes family      Barriers   Psychosocial barriers to participate in program  The patient should benefit from training in stress management and relaxation.;There are no identifiable barriers or psychosocial needs.      Screening Interventions   Interventions  Encouraged to exercise;Provide feedback about the scores to participant;Program counselor consult;To provide support and resources with identified psychosocial needs    Expected Outcomes  Short Term goal: Utilizing psychosocial counselor, staff and physician to assist with identification of specific Stressors or current issues interfering with healing process. Setting desired goal for each stressor or current issue identified.;Long Term Goal: Stressors or current issues are controlled or eliminated.;Short Term goal: Identification and review with participant of any Quality of Life or Depression concerns found by scoring the questionnaire.;Long Term goal: The participant improves quality of Life and PHQ9 Scores as seen by post scores and/or verbalization of changes       Quality of Life Scores:  Quality of Life - 11/20/17 1130      Quality of Life Scores   Health/Function Pre  26.8 %    Health/Function Post  26.43 %    Health/Function % Change  -1.38 %    Socioeconomic Pre  30 %    Socioeconomic Post  24.8 %    Socioeconomic % Change   -17.33 %    Psych/Spiritual Pre  30 %    Psych/Spiritual Post  30 %    Psych/Spiritual % Change  0 %    Family Pre  28.8 %    Family Post  28.3 %    Family % Change  -1.74 %    GLOBAL Pre  28.36 %    GLOBAL Post  27.25 %    GLOBAL % Change  -3.91 %      Scores of 19 and below usually indicate a poorer quality of life in these areas.  A difference of  2-3 points is a clinically meaningful difference.  A difference of 2-3 points in the total score of the Quality of Life Index has been associated with significant improvement in overall quality of  life, self-image, physical symptoms, and general health in studies assessing change in quality of life.  PHQ-9: Recent Review Flowsheet Data    Depression screen [facility] 2/9 11/20/2017  09/13/2017   Decreased Interest 0 0   Down, Depressed, Hopeless 0 0   PHQ - 2 Score 0 0   Altered sleeping 0 3   Tired, decreased energy 0 2   Change in appetite 0 0   Feeling bad or failure about yourself  0 0   Trouble concentrating 0 0   Moving slowly or fidgety/restless 0 0   Suicidal thoughts 0 0   PHQ-9 Score 0 5   Difficult doing work/chores Not difficult at all Not difficult at all     Interpretation of Total Score  Total Score Depression Severity:  1-4 = Minimal depression, 5-9 = Mild depression, 10-14 = Moderate depression, 15-19 = Moderately severe depression, 20-27 = Severe depression   Psychosocial Evaluation and Intervention: Psychosocial Evaluation - 09/27/17 0945      Psychosocial Evaluation & Interventions   Interventions  Encouraged to exercise with the program and follow exercise prescription    Comments  Counselor met with Mr. Curley Derby) today for initial psychosocial evaluation.  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  Eisen has a strong support system with a spouse of 52 years; a daughter and son who live locally; and he is actively involved in his local church.  Domenic reports some difficulty with interrupted sleep and he has a good appetite.  He denies a history of depression or anxiety or any current symptoms and is typically in a positive mood.  Jeneen Rinks has minimal stress in his life other than his health concerns.  He has goals to get back to his normal activities; increase his stamina and strength and lose some weight while in this program.  Staff will follow with him.    Expected Outcomes  Short:  [place] will meet with the dietician to address his weight loss goals.   Long:  He will exercise consistently to increase his stamina and strength and  to return to normal activities.      Continue Psychosocial Services   Follow up required by staff       Psychosocial Re-Evaluation: Psychosocial Re-Evaluation    [place] Name 10/11/17 1038 11/13/17 0853           Psychosocial Re-Evaluation   Current issues with  Current Stress Concerns  Current Stress Concerns      Comments  Hilbert has been doing well in rehab.  He continues to do well mentally.  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 He continues to sleep better.  He is feeling better overall and is stronger than when he started.    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  He doesn't report any new stress.      Expected Outcomes  Short: Continue to attend rehab to build strength and stamina. Long: Continue to remain positive.   Short  - Pt will continue to exercise and use stress mgmt techniques to keep stress low. 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  He doesn't report any new stress.    Expected Outcomes  Short  - Pt will continue to exercise and use stress mgmt techniques to keep stress low. Long - pt will manage stress successfully        Vocational Rehabilitation: Provide vocational rehab assistance to qualifying candidates.   Vocational Rehab Evaluation & Intervention: Vocational Rehab - 09/13/17 1442      Initial Vocational Rehab Evaluation & Intervention   Assessment shows need for Vocational Rehabilitation  No        Education: Education Goals: Education classes will be provided on a variety of topics geared toward better understanding of heart health and risk factor modification. Participant will state understanding/return demonstration of topics presented as noted by education test scores.  Learning Barriers/Preferences: Learning Barriers/Preferences - 09/13/17 1441      Learning Barriers/Preferences   Learning Barriers  Hearing    Learning Preferences  None       Education Topics:  AED/CPR: - Group verbal and written instruction with the use of models to demonstrate the basic use of the AED with the basic ABC's of resuscitation.   Cardiac Rehab from 11/20/2017 in [facility] Cardiac and Pulmonary Rehab  Date  10/25/17  Educator  CE  Instruction Review Code  1- Verbalizes Understanding      General Nutrition Guidelines/Fats and Fiber: -Group instruction provided by verbal, written material, models and posters to present the general guidelines for heart healthy nutrition. Gives an explanation and review of dietary fats and fiber.   Controlling Sodium/Reading Food Labels: -Group verbal and written material supporting the discussion of sodium use in heart healthy nutrition. Review and explanation with models, verbal and written materials for utilization of the food label.   Cardiac Rehab from 11/20/2017 in [facility] Cardiac and Pulmonary Rehab  Date  10/30/17  Educator  PI  Instruction Review Code  1- Verbalizes Understanding      Exercise Physiology & General Exercise Guidelines: - Group verbal and written instruction with models to review the exercise physiology of the cardiovascular system and associated critical values. Provides general exercise guidelines with specific guidelines to those with heart or lung disease.    Cardiac Rehab from 11/20/2017 in [facility] Cardiac and Pulmonary Rehab  Date  11/06/17  Educator  AS  Instruction Review Code  1- Verbalizes Understanding      Aerobic  Exercise & Resistance Training: - Gives group verbal and written instruction on the various components of exercise. Focuses on aerobic and resistive training programs and the benefits of this training and how to safely progress through these programs..   Cardiac Rehab from 11/20/2017 in [facility] Cardiac and Pulmonary Rehab  Date  11/20/17  Educator  [facility]  Instruction Review Code  1- Verbalizes Understanding      Flexibility, Balance, Mind/Body Relaxation: Provides group verbal/written instruction on the benefits of flexibility and balance training, including mind/body exercise modes such as yoga, pilates and tai chi.  Demonstration and skill practice provided.   Cardiac Rehab from 11/20/2017 in [facility] Cardiac and Pulmonary Rehab  Date  09/25/17  Educator  AS  Instruction Review Code  1- Verbalizes Understanding      Stress and Anxiety: - Provides group verbal and written instruction about the health risks of elevated stress and causes of high stress.  Discuss the correlation between heart/lung disease and anxiety and treatment  options. Review healthy ways to manage with stress and anxiety.   Depression: - Provides group verbal and written instruction on the correlation between heart/lung disease and depressed mood, treatment options, and the stigmas associated with seeking treatment.   Cardiac Rehab from 11/20/2017 in [facility] Cardiac and Pulmonary Rehab  Date  11/13/17  Educator  [facility]  Instruction Review Code  1- Verbalizes Understanding      Anatomy & Physiology of the Heart: - Group verbal and written instruction and models provide basic cardiac anatomy and physiology, with the coronary electrical and arterial systems. Review of Valvular disease and Heart Failure   Cardiac Procedures: - Group verbal and written instruction to review commonly prescribed medications for heart disease. Reviews the medication, class of the drug, and side effects. Includes the steps to properly store meds and  maintain the prescription regimen. (beta blockers and nitrates)   Cardiac Rehab from 11/20/2017 in [facility] Cardiac and Pulmonary Rehab  Date  10/18/17  Educator  CE  Instruction Review Code  1- Verbalizes Understanding      Cardiac Medications I: - Group verbal and written instruction to review commonly prescribed medications for heart disease. Reviews the medication, class of the drug, and side effects. Includes the steps to properly store meds and maintain the prescription regimen.   Cardiac Rehab from 11/20/2017 in [facility] Cardiac and Pulmonary Rehab  Date  10/09/17  Educator  SB  Instruction Review Code  1- Verbalizes Understanding      Cardiac Medications II: -Group verbal and written instruction to review commonly prescribed medications for heart disease. Reviews the medication, class of the drug, and side effects. (all other drug classes)   Cardiac Rehab from 11/20/2017 in [facility] Cardiac and Pulmonary Rehab  Date  11/15/17  Educator  CE  Instruction Review Code  1- Verbalizes Understanding       Go Sex-Intimacy & Heart Disease, Get SMART - Goal Setting: - Group verbal and written instruction through game format to discuss heart disease and the return to sexual intimacy. Provides group verbal and written material to discuss and apply goal setting through the application of the S.M.A.R.T. Method.   Cardiac Rehab from 11/20/2017 in [facility] Cardiac and Pulmonary Rehab  Date  10/18/17  Educator  CE  Instruction Review Code  1- Verbalizes Understanding      Other Matters of the Heart: - Provides group verbal, written materials and models to describe Stable Angina and Peripheral Artery. Includes description of the disease process and treatment options available to the cardiac patient.   Exercise & Equipment Safety: - Individual verbal instruction and demonstration of equipment use and safety with use of the equipment.   Cardiac Rehab from 11/20/2017 in [facility] Cardiac and Pulmonary Rehab   Date  09/13/17  Educator  [facility]  Instruction Review Code  1- Verbalizes Understanding      Infection Prevention: - Provides verbal and written material to individual with discussion of infection control including proper hand washing and proper equipment cleaning during exercise session.   Cardiac Rehab from 11/20/2017 in [facility] Cardiac and Pulmonary Rehab  Date  09/13/17  Educator  [facility]  Instruction Review Code  1- Verbalizes Understanding      Falls Prevention: - Provides verbal and written material to individual with discussion of falls prevention and safety.   Cardiac Rehab from 11/20/2017 in [facility] Cardiac and Pulmonary Rehab  Date  09/13/17  Educator  [facility]  Instruction Review Code  1- Verbalizes Understanding      Diabetes: -  Individual verbal and written instruction to review signs/symptoms of diabetes, desired ranges of glucose level fasting, after meals and with exercise. Acknowledge that pre and post exercise glucose checks will be done for 3 sessions at entry of program.   Know Your Numbers and Risk Factors: -Group verbal and written instruction about important numbers in your health.  Discussion of what are risk factors and how they play a role in the disease process.  Review of Cholesterol, Blood Pressure, Diabetes, and BMI and the role they play in your overall health.   Cardiac Rehab from 11/20/2017 in [facility] Cardiac and Pulmonary Rehab  Date  11/15/17  Educator  CE  Instruction Review Code  1- Verbalizes Understanding      Sleep Hygiene: -Provides group verbal and written instruction about how sleep can affect your health.  Define sleep hygiene, discuss sleep cycles and impact of sleep habits. Review good sleep hygiene tips.    Cardiac Rehab from 11/20/2017 in [facility] Cardiac and Pulmonary Rehab  Date  10/16/17  Educator  [facility]  Instruction Review Code  1- Verbalizes Understanding      Other: -Provides group and verbal instruction on various topics (see  comments)   Knowledge Questionnaire Score: Knowledge Questionnaire Score - 11/20/17 1130      Knowledge Questionnaire Score   Pre Score  23/28    Post Score  22/28       Core Components/Risk Factors/Patient Goals at Admission: Personal Goals and Risk Factors at Admission - 09/13/17 1433      Core Components/Risk Factors/Patient Goals on Admission    Weight Management  Yes;Weight Maintenance    Intervention  Weight Management: Develop a combined nutrition and exercise program designed to reach desired caloric intake, while maintaining appropriate intake of nutrient and fiber, sodium and fats, and appropriate energy expenditure required for the weight goal.;Weight Management: Provide education and appropriate resources to help participant work on and attain dietary goals.    Admit Weight  175 lb (79.4 kg)    Expected Outcomes  Short Term: Continue to assess and modify interventions until short term weight is achieved;Long Term: Adherence to nutrition and physical activity/exercise program aimed toward attainment of established weight goal;Weight Maintenance: Understanding of the daily nutrition guidelines, which includes 25-35% calories from fat, 7% or less cal from saturated fats, less than 262m cholesterol, less than 1.5gm of sodium, & 5 or more servings of fruits and vegetables daily;Understanding recommendations for meals to include 15-35% energy as protein, 25-35% energy from fat, 35-60% energy from carbohydrates, less than 2014mof dietary cholesterol, 20-35 gm of total fiber daily;Understanding of distribution of calorie intake throughout the day with the consumption of 4-5 meals/snacks    Hypertension  Yes    Intervention  Provide education on lifestyle modifcations including regular physical activity/exercise, weight management, moderate sodium restriction and increased consumption of fresh fruit, vegetables, and low fat dairy, alcohol moderation, and smoking cessation.;Monitor  prescription use compliance.    Expected Outcomes  Short Term: Continued assessment and intervention until BP is < 140/9053mG in hypertensive participants. < 130/47m47m in hypertensive participants with diabetes, heart failure or chronic kidney disease.;Long Term: Maintenance of blood pressure at goal levels.    Lipids  Yes    Intervention  Provide education and support for participant on nutrition & aerobic/resistive exercise along with prescribed medications to achieve LDL <70mg2mL >40mg.66mExpected Outcomes  Short Term: Participant states understanding of desired cholesterol values and is compliant with medications prescribed.  Participant is following exercise prescription and nutrition guidelines.;Long Term: Cholesterol controlled with medications as prescribed, with individualized exercise RX and with personalized nutrition plan. Value goals: LDL < 66m, HDL > 40 mg.       Core Components/Risk Factors/Patient Goals Review:  Goals and Risk Factor Review    Row Name 10/11/17 1025 11/13/17 0845           Core Components/Risk Factors/Patient Goals Review   Personal Goals Review  Weight Management/Obesity;Hypertension;Lipids  Weight Management/Obesity;Lipids;Hypertension      Review  LJamahas been doing well in rehab.  He is holding steady on his weight.  After meeting with dietician, he realized that he needs to work on portion control to help with his weight loss.  He lost 15 lbs in the hospital and is starting to gain it back.  His blood pressures have been good and run in the 120s at home when he checks them.  He is doing well on his medications.   Uriah hasnt lost weight but can wear clothes he wasnt able to before.  BP stays betweem 116-132 at home.  He is taking meds as directed.  His Dr took him off some meds since his numbes has been good.  He got a good report from his cardiologist.  He wants to finish HT and plans to walk and possibly join silver Sneakers program.        Expected Outcomes  Short: Continue to work on weight loss.  Long: Continue to work on risk factors.   Short - LJaraewill finish HT Long - LVinwill maintain exercise and healthy dietary habits on his own         Core Components/Risk Factors/Patient Goals at Discharge (Final Review):  Goals and Risk Factor Review - 11/13/17 0845      Core Components/Risk Factors/Patient Goals Review   Personal Goals Review  Weight Management/Obesity;Lipids;Hypertension    Review  LChetanhasnt lost weight but can wear clothes he wasnt able to before.  BP stays betweem 116-132 at home.  He is taking meds as directed.  His Dr took him off some meds since his numbes has been good.  He got a good report from his cardiologist.  He wants to finish HT and plans to walk and possibly join silver Sneakers program.     Expected Outcomes  Short - LItzelwill finish HT Long - LEduardowill maintain exercise and healthy dietary habits on his own       ITP Comments: ITP Comments    Row Name 09/13/17 1421 09/26/17 0546 10/24/17 0609 11/21/17 0609     ITP Comments  Med Review completed. Initial ITP created. Diagnosis can be found in Care Everywhere 08/06/17  30 day review. Continue with ITP unless directed changes per Medical Director  New to program  30 day review. Continue with ITP unless directed changes per Medical Director  30 day review. Continue with ITP unless directed changes per Medical Director review   3 visits in June       Comments:

## 2017-11-27 DIAGNOSIS — Z48812 Encounter for surgical aftercare following surgery on the circulatory system: Secondary | ICD-10-CM | POA: Diagnosis not present

## 2017-11-27 DIAGNOSIS — Z951 Presence of aortocoronary bypass graft: Secondary | ICD-10-CM

## 2017-11-27 NOTE — Progress Notes (Signed)
Daily Session Note  Patient Details  Name: Warren Ingram MRN: [id number] Date of Birth: May 10, 1941 Referring Provider:     Cardiac Rehab from 09/13/2017 in [facility] Cardiac and Pulmonary Rehab  Referring Provider  Ikonomidis      Encounter Date: 11/27/2017  Check In: Session Check In - 11/27/17 0847      Check-In   Location  [facility]-Cardiac & Pulmonary Rehab    Staff Present  Heath Lark, RN, BSN, CCRP;Jessica Haliimaile, MA, RCEP, CCRP, Exercise Physiologist;Errika Narvaiz Oletta Darter, [place], ACSM CEP, Exercise Physiologist    Supervising physician immediately available to respond to emergencies  See telemetry face sheet for immediately available ER MD    Medication changes reported      No    Fall or balance concerns reported     No    Warm-up and Cool-down  Performed on first and last piece of equipment    Resistance Training Performed  Yes    VAD Patient?  No    PAD/SET Patient?  No      Pain Assessment   Currently in Pain?  No/denies    Multiple Pain Sites  No          Social History   Tobacco Use  Smoking Status Former Smoker  . Packs/day: 1.00  . Last attempt to quit: 1985  . Years since quitting: 34.5  Smokeless Tobacco Never Used    Goals Met:  Independence with exercise equipment Exercise tolerated well No report of cardiac concerns or symptoms Strength training completed today  Goals Unmet:  Not Applicable  Comments: Pt able to follow exercise prescription today without complaint.  Will continue to monitor for progression.    Dr. Emily Filbert is Medical Director for [place] and [facility].

## 2017-11-29 DIAGNOSIS — Z48812 Encounter for surgical aftercare following surgery on the circulatory system: Secondary | ICD-10-CM | POA: Diagnosis not present

## 2017-11-29 DIAGNOSIS — Z951 Presence of aortocoronary bypass graft: Secondary | ICD-10-CM

## 2017-11-29 NOTE — Patient Instructions (Signed)
Discharge Patient Instructions  Patient Details  Name: Warren Ingram MRN: [id number] Date of Birth: 25-Dec-1940 Referring Provider:  Idelle Crouch, MD   Number of Visits: 6  Reason for Discharge:  Patient reached a stable level of exercise. Patient independent in their exercise. Patient has met program and personal goals.  Smoking History:  Social History   Tobacco Use  Smoking Status Former Smoker  . Packs/day: 1.00  . Last attempt to quit: 1985  . Years since quitting: 34.5  Smokeless Tobacco Never Used    Diagnosis:  No diagnosis found.  Initial Exercise Prescription: Initial Exercise Prescription - 09/13/17 1400      Date of Initial Exercise RX and Referring Provider   Date  09/13/17    Referring Provider  Ikonomidis      Treadmill   MPH  1.8    Grade  0    Minutes  15    METs  2.5      Recumbant Bike   Level  3    RPM  60    Watts  10    Minutes  15    METs  2.5      Arm Ergometer   Level  1    RPM  40    Minutes  15    METs  2.5      T5 Nustep   Level  1    SPM  80    Minutes  15    METs  2.5      Prescription Details   Frequency (times per week)  3    Duration  Progress to 30 minutes of continuous aerobic without signs/symptoms of physical distress      Intensity   THRR 40-80% of Max Heartrate  95-128    Ratings of Perceived Exertion  11-13    Perceived Dyspnea  0-4      Resistance Training   Training Prescription  Yes    Weight  3 lb    Reps  10-15       Discharge Exercise Prescription (Final Exercise Prescription Changes): Exercise Prescription Changes - 11/13/17 1500      Response to Exercise   Blood Pressure (Admit)  126/62    Blood Pressure (Exercise)  144/62    Blood Pressure (Exit)  120/68    Heart Rate (Admit)  72 bpm    Heart Rate (Exercise)  87 bpm    Heart Rate (Exit)  51 bpm    Rating of Perceived Exertion (Exercise)  13    Symptoms  none    Duration  Continue with 30 min of aerobic exercise without  signs/symptoms of physical distress.    Intensity  THRR unchanged      Progression   Progression  Continue to progress workloads to maintain intensity without signs/symptoms of physical distress.    Average METs  2.38      Resistance Training   Training Prescription  Yes    Weight  3 lbs    Reps  10-15      Interval Training   Interval Training  No      Treadmill   MPH  1.8    Grade  0    Minutes  15    METs  2.38      Recumbant Bike   Level  9    Watts  33    Minutes  15    METs  3.31      Arm Ergometer  Level  3    Minutes  15    METs  1.5      Home Exercise Plan   Plans to continue exercise at  [facility] at home    Frequency  Add 2 additional days to program exercise sessions.    Initial Home Exercises Provided  09/20/17       Functional Capacity: 6 Minute Walk    Row Name 09/13/17 1452 11/13/17 0849       6 Minute Walk   Phase  -  Discharge    Distance  1142 feet  1430 feet    Distance Feet Change  -  288 ft    Walk Time  6 minutes  6 minutes    # of Rest Breaks  0  0    MPH  2.16  2.7    METS  2.37  2.45    RPE  12  11    Perceived Dyspnea   1  1    VO2 Peak  8.3  8.5    Symptoms  No  No    Resting HR  61 bpm  58 bpm    Resting BP  134/80  126/68    Resting Oxygen Saturation   97 %  99 %    Exercise Oxygen Saturation  during 6 min walk  99 %  98 %    Max Ex. HR  93 bpm  69 bpm    Max Ex. BP  164/70  144/62    2 Minute Post BP  132/66  124/70       Quality of Life: Quality of Life - 11/20/17 1130      Quality of Life Scores   Health/Function Pre  26.8 %    Health/Function Post  26.43 %    Health/Function % Change  -1.38 %    Socioeconomic Pre  30 %    Socioeconomic Post  24.8 %    Socioeconomic % Change   -17.33 %    Psych/Spiritual Pre  30 %    Psych/Spiritual Post  30 %    Psych/Spiritual % Change  0 %    Family Pre  28.8 %    Family Post  28.3 %    Family % Change  -1.74 %    GLOBAL Pre  28.36 %    GLOBAL Post  27.25 %     GLOBAL % Change  -3.91 %       Personal Goals: Goals established at orientation with interventions provided to work toward goal. Personal Goals and Risk Factors at Admission - 09/13/17 1433      Core Components/Risk Factors/Patient Goals on Admission    Weight Management  Yes;Weight Maintenance    Intervention  Weight Management: Develop a combined nutrition and exercise program designed to reach desired caloric intake, while maintaining appropriate intake of nutrient and fiber, sodium and fats, and appropriate energy expenditure required for the weight goal.;Weight Management: Provide education and appropriate resources to help participant work on and attain dietary goals.    Admit Weight  175 lb (79.4 kg)    Expected Outcomes  Short Term: Continue to assess and modify interventions until short term weight is achieved;Long Term: Adherence to nutrition and physical activity/exercise program aimed toward attainment of established weight goal;Weight Maintenance: Understanding of the daily nutrition guidelines, which includes 25-35% calories from fat, 7% or less cal from saturated fats, less than '200mg'$  cholesterol, less than 1.5gm of sodium, & 5  or more servings of fruits and vegetables daily;Understanding recommendations for meals to include 15-35% energy as protein, 25-35% energy from fat, 35-60% energy from carbohydrates, less than 231m of dietary cholesterol, 20-35 gm of total fiber daily;Understanding of distribution of calorie intake throughout the day with the consumption of 4-5 meals/snacks    Hypertension  Yes    Intervention  Provide education on lifestyle modifcations including regular physical activity/exercise, weight management, moderate sodium restriction and increased consumption of fresh fruit, vegetables, and low fat dairy, alcohol moderation, and smoking cessation.;Monitor prescription use compliance.    Expected Outcomes  Short Term: Continued assessment and intervention until BP  is < 140/938mHG in hypertensive participants. < 130/8068mG in hypertensive participants with diabetes, heart failure or chronic kidney disease.;Long Term: Maintenance of blood pressure at goal levels.    Lipids  Yes    Intervention  Provide education and support for participant on nutrition & aerobic/resistive exercise along with prescribed medications to achieve LDL <42m14mDL >40mg10m Expected Outcomes  Short Term: Participant states understanding of desired cholesterol values and is compliant with medications prescribed. Participant is following exercise prescription and nutrition guidelines.;Long Term: Cholesterol controlled with medications as prescribed, with individualized exercise RX and with personalized nutrition plan. Value goals: LDL < 42mg,40m > 40 mg.        Personal Goals Discharge: Goals and Risk Factor Review - 11/13/17 0845      Core Components/Risk Factors/Patient Goals Review   Personal Goals Review  Weight Management/Obesity;Lipids;Hypertension    Review  LeonarNeilan lost weight but can wear clothes he wasnt able to before.  BP stays betweem 116-132 at home.  He is taking meds as directed.  His Dr took him off some meds since his numbes has been good.  He got a good report from his cardiologist.  He wants to finish HT and plans to walk and possibly join [facility] [employer] program.     Expected Outcomes  Short - LeonarDeshonefinish HT Long - LeonarNathanyelmaintain exercise and healthy dietary habits on his own       Exercise Goals and Review: Exercise Goals    Row Name 09/13/17 1452             Exercise Goals   Increase Physical Activity  Yes       Intervention  Provide advice, education, support and counseling about physical activity/exercise needs.;Develop an individualized exercise prescription for aerobic and resistive training based on initial evaluation findings, risk stratification, comorbidities and participant's personal goals.       Expected Outcomes  Short  Term: Attend rehab on a regular basis to increase amount of physical activity.;Long Term: Add in home exercise to make exercise part of routine and to increase amount of physical activity.;Long Term: Exercising regularly at least 3-5 days a week.       Increase Strength and Stamina  Yes       Intervention  Provide advice, education, support and counseling about physical activity/exercise needs.;Develop an individualized exercise prescription for aerobic and resistive training based on initial evaluation findings, risk stratification, comorbidities and participant's personal goals.       Expected Outcomes  Short Term: Increase workloads from initial exercise prescription for resistance, speed, and METs.;Long Term: Improve cardiorespiratory fitness, muscular endurance and strength as measured by increased METs and functional capacity (6MWT);Short Term: Perform resistance training exercises routinely during rehab and add in resistance training at home       Able  to understand and use rate of perceived exertion (RPE) scale  Yes       Intervention  Provide education and explanation on how to use RPE scale       Expected Outcomes  Short Term: Able to use RPE daily in rehab to express subjective intensity level;Long Term:  Able to use RPE to guide intensity level when exercising independently       Able to understand and use Dyspnea scale  Yes       Intervention  Provide education and explanation on how to use Dyspnea scale       Expected Outcomes  Short Term: Able to use Dyspnea scale daily in rehab to express subjective sense of shortness of breath during exertion;Long Term: Able to use Dyspnea scale to guide intensity level when exercising independently       Knowledge and understanding of Target Heart Rate Range (THRR)  Yes       Intervention  Provide education and explanation of THRR including how the numbers were predicted and where they are located for reference       Expected Outcomes  Short Term: Able  to state/look up THRR;Short Term: Able to use daily as guideline for intensity in rehab;Long Term: Able to use THRR to govern intensity when exercising independently       Able to check pulse independently  Yes       Intervention  Provide education and demonstration on how to check pulse in carotid and radial arteries.;Review the importance of being able to check your own pulse for safety during independent exercise       Expected Outcomes  Short Term: Able to explain why pulse checking is important during independent exercise;Long Term: Able to check pulse independently and accurately       Understanding of Exercise Prescription  Yes       Intervention  Provide education, explanation, and written materials on patient's individual exercise prescription       Expected Outcomes  Short Term: Able to explain program exercise prescription;Long Term: Able to explain home exercise prescription to exercise independently          Nutrition & Weight - Outcomes: Pre Biometrics - 09/13/17 1452      Pre Biometrics   Height  '5\' 5"'$  (1.651 m)    Weight  175 lb 12.8 oz (79.7 kg)    Waist Circumference  39 inches    Hip Circumference  39 inches    Waist to Hip Ratio  1 %    BMI (Calculated)  29.25    Single Leg Stand  12.51 seconds      Post Biometrics - 11/13/17 0904       Post  Biometrics   Height  '5\' 5"'$  (1.651 m)    Weight  179 lb (81.2 kg)    Waist Circumference  37.5 inches    Hip Circumference  38 inches    Waist to Hip Ratio  0.99 %    BMI (Calculated)  29.79    Single Leg Stand  14.5 seconds       Nutrition: Nutrition Therapy & Goals - 09/27/17 0916      Nutrition Therapy   Diet  DASH    Drug/Food Interactions  Statins/Certain Fruits    Protein (specify units)  9oz    Fiber  30 grams    Whole Grain Foods  3 servings    Saturated Fats  15 max. grams    Fruits and Vegetables  6 servings/day 8 ideal    Sodium  2000 grams      Personal Nutrition Goals   Nutrition Goal  Monitor  portion sizes more closely at meal times. Utilize the plate method: 1/2 plate vegetables, 1/4 plate protein, 1/4 plate starch he feels that the reason why he cannot lose weight is because he "eats too much" and finishes whatever portion of food his wife puts on his plate    Personal Goal #2  Continue to make more nutritious snack options and only snack as needed between meal or in the evenings used to drink 1/2 gallon of whole milk + oreo cookies as a snack but has been working to choose options like PB + crackers instead and not snacking in the evenings    Personal Goal #3  When eating out, choose less fried / greasy food options. Order a side of vegetables or fruit to add fiber to the meal    Comments  His wife is very health-conscious and typically cooks nutritious meals at home; lean meat + 1-2 vegetables are always available. She does not cook with salt and reads nutrition labels for sodium and cholesterol content. He is very physically active outside of class      Intervention Plan   Intervention  Prescribe, educate and counsel regarding individualized specific dietary modifications aiming towards targeted core components such as weight, hypertension, lipid management, diabetes, heart failure and other comorbidities.    Expected Outcomes  Short Term Goal: A plan has been developed with personal nutrition goals set during dietitian appointment.;Long Term Goal: Adherence to prescribed nutrition plan.;Short Term Goal: Understand basic principles of dietary content, such as calories, fat, sodium, cholesterol and nutrients.       Nutrition Discharge: Nutrition Assessments - 11/20/17 1131      MEDFICTS Scores   Pre Score  54    Post Score  49    Score Difference  -5       Education Questionnaire Score: Knowledge Questionnaire Score - 11/20/17 1130      Knowledge Questionnaire Score   Pre Score  23/28    Post Score  22/28       Goals reviewed with patient; copy given to patient.

## 2017-11-29 NOTE — Progress Notes (Signed)
Cardiac Individual Treatment Plan  Patient Details  Name: Warren Ingram MRN: [id number] Date of Birth: 1940-07-27 Referring Provider:     Cardiac Rehab from 09/13/2017 in [facility] Cardiac and Pulmonary Rehab  Referring Provider  Ikonomidis      Initial Encounter Date:    Cardiac Rehab from 09/13/2017 in [facility] Cardiac and Pulmonary Rehab  Date  09/13/17      Visit Diagnosis: S/P CABG x 4  Patient's Home Medications on Admission:  Current Outpatient Medications:  .  acetaminophen (TYLENOL) 500 MG tablet, Take 500 mg by mouth every 8 (eight) hours as needed for mild pain or headache., Disp: , Rfl:  .  amiodarone (PACERONE) 200 MG tablet, , Disp: , Rfl:  .  aspirin (ASPIRIN EC) 81 MG EC tablet, Take 81 mg by mouth daily. Swallow whole., Disp: , Rfl:  .  atorvastatin (LIPITOR) 80 MG tablet, Take 80 mg by mouth daily., Disp: , Rfl:  .  calcium carbonate (TUMS - DOSED IN MG ELEMENTAL CALCIUM) 500 MG chewable tablet, Chew 2 tablets by mouth 2 (two) times daily as needed for indigestion or heartburn., Disp: , Rfl:  .  colchicine (COLCRYS) 0.6 MG tablet, , Disp: , Rfl:  .  ELIQUIS 5 MG TABS tablet, , Disp: , Rfl:  .  fluticasone (FLONASE) 50 MCG/ACT nasal spray, Place 2 sprays into both nostrils daily., Disp: , Rfl:  .  furosemide (LASIX) 40 MG tablet, , Disp: , Rfl:  .  gemfibrozil (LOPID) 600 MG tablet, Take 600 mg by mouth 2 (two) times daily before a meal., Disp: , Rfl:  .  isosorbide mononitrate (IMDUR) 60 MG 24 hr tablet, Take 60 mg by mouth 2 (two) times daily., Disp: , Rfl:  .  levothyroxine (SYNTHROID, LEVOTHROID) 150 MCG tablet, Take 150 mcg by mouth daily before breakfast., Disp: , Rfl:  .  metoprolol tartrate (LOPRESSOR) 25 MG tablet, , Disp: , Rfl:  .  Omega-3 Fatty Acids (FISH OIL) 1000 MG CAPS, Take 2,000 mg by mouth daily. , Disp: , Rfl:  .  omeprazole (PRILOSEC) 20 MG capsule, Take 20 mg by mouth daily., Disp: , Rfl:  .  sildenafil (VIAGRA) 100 MG tablet, Take by mouth.,  Disp: , Rfl:  .  tamsulosin (FLOMAX) 0.4 MG CAPS capsule, , Disp: , Rfl:   Past Medical History: Past Medical History:  Diagnosis Date  . Arthritis   . Barrett esophagus   . Benign neoplasm of abdomen   . Benign neoplasm of colon   . Coronary artery disease   . GERD (gastroesophageal reflux disease)   . Hypercholesterolemia   . Hypothyroidism   . Stroke [facility])     Tobacco Use: Social History   Tobacco Use  Smoking Status Former Smoker  . Packs/day: 1.00  . Last attempt to quit: 1985  . Years since quitting: 34.5  Smokeless Tobacco Never Used    Labs: Recent Review Flowsheet Data    There is no flowsheet data to display.       Exercise Target Goals:    Exercise Program Goal: Individual exercise prescription set using results from initial 6 min walk test and THRR while considering  patient's activity barriers and safety.   Exercise Prescription Goal: Initial exercise prescription builds to 30-45 minutes a day of aerobic activity, 2-3 days per week.  Home exercise guidelines will be given to patient during program as part of exercise prescription that the participant will acknowledge.  Activity Barriers & Risk Stratification: Activity Barriers &  Cardiac Risk Stratification - 09/13/17 1443      Activity Barriers & Cardiac Risk Stratification   Activity Barriers  None    Cardiac Risk Stratification  Moderate       6 Minute Walk: 6 Minute Walk    Row Name 09/13/17 1452 11/13/17 0849       6 Minute Walk   Phase  -  Discharge    Distance  1142 feet  1430 feet    Distance Feet Change  -  288 ft    Walk Time  6 minutes  6 minutes    # of Rest Breaks  0  0    MPH  2.16  2.7    METS  2.37  2.45    RPE  12  11    Perceived Dyspnea   1  1    VO2 Peak  8.3  8.5    Symptoms  No  No    Resting HR  61 bpm  58 bpm    Resting BP  134/80  126/68    Resting Oxygen Saturation   97 %  99 %    Exercise Oxygen Saturation  during 6 min walk  99 %  98 %    Max Ex. HR  93  bpm  69 bpm    Max Ex. BP  164/70  144/62    2 Minute Post BP  132/66  124/70       Oxygen Initial Assessment:   Oxygen Re-Evaluation:   Oxygen Discharge (Final Oxygen Re-Evaluation):   Initial Exercise Prescription: Initial Exercise Prescription - 09/13/17 1400      Date of Initial Exercise RX and Referring Provider   Date  09/13/17    Referring Provider  Ikonomidis      Treadmill   MPH  1.8    Grade  0    Minutes  15    METs  2.5      Recumbant Bike   Level  3    RPM  60    Watts  10    Minutes  15    METs  2.5      Arm Ergometer   Level  1    RPM  40    Minutes  15    METs  2.5      T5 Nustep   Level  1    SPM  80    Minutes  15    METs  2.5      Prescription Details   Frequency (times per week)  3    Duration  Progress to 30 minutes of continuous aerobic without signs/symptoms of physical distress      Intensity   THRR 40-80% of Max Heartrate  95-128    Ratings of Perceived Exertion  11-13    Perceived Dyspnea  0-4      Resistance Training   Training Prescription  Yes    Weight  3 lb    Reps  10-15       Perform Capillary Blood Glucose checks as needed.  Exercise Prescription Changes: Exercise Prescription Changes    Row Name 09/13/17 1400 09/18/17 1500 09/20/17 0900 10/02/17 1500 10/16/17 1300     Response to Exercise   Blood Pressure (Admit)  134/80  126/64  -  128/60  120/60   Blood Pressure (Exercise)  164/70  132/64  -  124/72  122/64   Blood Pressure (Exit)  132/66  112/58  -  124/70  130/66   Heart Rate (Admit)  67 bpm  84 bpm  -  57 bpm  74 bpm   Heart Rate (Exercise)  93 bpm  93 bpm  -  97 bpm  106 bpm   Heart Rate (Exit)  66 bpm  58 bpm  -  62 bpm  58 bpm   Oxygen Saturation (Admit)  97 %  -  -  -  -   Rating of Perceived Exertion (Exercise)  12  13  -  11  13   Symptoms  -  none  -  none  none   Comments  -  first full day of exercise  -  -  -   Duration  -  Continue with 30 min of aerobic exercise without signs/symptoms  of physical distress.  -  Continue with 30 min of aerobic exercise without signs/symptoms of physical distress.  Continue with 30 min of aerobic exercise without signs/symptoms of physical distress.   Intensity  -  THRR unchanged  -  THRR unchanged  THRR unchanged     Progression   Progression  -  Continue to progress workloads to maintain intensity without signs/symptoms of physical distress.  -  Continue to progress workloads to maintain intensity without signs/symptoms of physical distress.  Continue to progress workloads to maintain intensity without signs/symptoms of physical distress.   Average METs  -  2.14  -  2.36  2.49     Resistance Training   Training Prescription  -  Yes  -  Yes  Yes   Weight  -  3 lbs  -  3 lbs  3 lbs   Reps  -  10-15  -  10-15  10-15     Interval Training   Interval Training  -  No  -  No  No     Treadmill   MPH  -  -  -  1.8  1.8   Grade  -  -  -  0  0   Minutes  -  -  -  15  15   METs  -  -  -  2.38  2.38     Recumbant Bike   Level  -  1  -  7  9   Watts  -  -  -  36  34   Minutes  -  15  -  15  15   METs  -  2.74  -  3.3  3.38     Arm Ergometer   Level  -  1  -  1  3   Minutes  -  15  -  15  15   METs  -  1.5  -  1.4  1.7     Home Exercise Plan   Plans to continue exercise at  -  -  [employer] walk at home  [employer] walk at home  [employer] walk at home   Frequency  -  -  Add 2 additional days to program exercise sessions.  Add 2 additional days to program exercise sessions.  Add 2 additional days to program exercise sessions.   Initial Home Exercises Provided  -  -  09/20/17  09/20/17  09/20/17   Row Name 10/30/17 1500 11/13/17 1500           Response to Exercise   Blood Pressure (Admit)  126/60  126/62  Blood Pressure (Exercise)  128/64  144/62      Blood Pressure (Exit)  134/70  120/68      Heart Rate (Admit)  61 bpm  72 bpm      Heart Rate (Exercise)  93 bpm  87 bpm      Heart Rate (Exit)  59 bpm  51 bpm      Rating of  Perceived Exertion (Exercise)  13  13      Symptoms  none  none      Duration  Continue with 30 min of aerobic exercise without signs/symptoms of physical distress.  Continue with 30 min of aerobic exercise without signs/symptoms of physical distress.      Intensity  THRR unchanged  THRR unchanged        Progression   Progression  Continue to progress workloads to maintain intensity without signs/symptoms of physical distress.  Continue to progress workloads to maintain intensity without signs/symptoms of physical distress.      Average METs  2.42  2.38        Resistance Training   Training Prescription  Yes  Yes      Weight  3 lbs  3 lbs      Reps  10-15  10-15        Interval Training   Interval Training  No  No        Treadmill   MPH  1.8  1.8      Grade  0  0      Minutes  15  15      METs  2.38  2.38        Recumbant Bike   Level  9  9      Watts  36  33      Minutes  15  15      METs  3.38  3.31        Arm Ergometer   Level  3  3      Minutes  15  15      METs  1.5  1.5        Home Exercise Plan   Plans to continue exercise at  [employer] walk at home  [place] walk at home      Frequency  Add 2 additional days to program exercise sessions.  Add 2 additional days to program exercise sessions.      Initial Home Exercises Provided  09/20/17  09/20/17         Exercise Comments: Exercise Comments    Row Name 09/18/17 1020 09/20/17 0906 11/29/17 0810       Exercise Comments   First full day of exercise!  Patient was oriented to gym and equipment including functions, settings, policies, and procedures.  Patient's individual exercise prescription and treatment plan were reviewed.  All starting workloads were established based on the results of the 6 minute walk test done at initial orientation visit.  The plan for exercise progression was also introduced and progression will be customized based on patient's performance and goals.   Reviewed home exercise with pt  today.  Pt plans to attend [place] and walk at home for exercise.  Reviewed THR, pulse, RPE, sign and symptoms, NTG use, and when to call 911 or MD.  Also discussed weather considerations and indoor options.  Pt voiced understanding.   Cannen graduated today from  rehab with 36 sessions completed.  Details of the patient's exercise prescription  and what He needs to do in order to continue the prescription and progress were discussed with patient.  Patient was given a copy of prescription and goals.  Patient verbalized understanding.  [place] plans to continue to exercise by walking at home.        Exercise Goals and Review: Exercise Goals    Row Name 09/13/17 1452             Exercise Goals   Increase Physical Activity  Yes       Intervention  Provide advice, education, support and counseling about physical activity/exercise needs.;Develop an individualized exercise prescription for aerobic and resistive training based on initial evaluation findings, risk stratification, comorbidities and participant's personal goals.       Expected Outcomes  Short Term: Attend rehab on a regular basis to increase amount of physical activity.;Long Term: Add in home exercise to make exercise part of routine and to increase amount of physical activity.;Long Term: Exercising regularly at least 3-5 days a week.       Increase Strength and Stamina  Yes       Intervention  Provide advice, education, support and counseling about physical activity/exercise needs.;Develop an individualized exercise prescription for aerobic and resistive training based on initial evaluation findings, risk stratification, comorbidities and participant's personal goals.       Expected Outcomes  Short Term: Increase workloads from initial exercise prescription for resistance, speed, and METs.;Long Term: Improve cardiorespiratory fitness, muscular endurance and strength as measured by increased METs and functional capacity (6MWT);Short  Term: Perform resistance training exercises routinely during rehab and add in resistance training at home       Able to understand and use rate of perceived exertion (RPE) scale  Yes       Intervention  Provide education and explanation on how to use RPE scale       Expected Outcomes  Short Term: Able to use RPE daily in rehab to express subjective intensity level;Long Term:  Able to use RPE to guide intensity level when exercising independently       Able to understand and use Dyspnea scale  Yes       Intervention  Provide education and explanation on how to use Dyspnea scale       Expected Outcomes  Short Term: Able to use Dyspnea scale daily in rehab to express subjective sense of shortness of breath during exertion;Long Term: Able to use Dyspnea scale to guide intensity level when exercising independently       Knowledge and understanding of Target Heart Rate Range (THRR)  Yes       Intervention  Provide education and explanation of THRR including how the numbers were predicted and where they are located for reference       Expected Outcomes  Short Term: Able to state/look up THRR;Short Term: Able to use daily as guideline for intensity in rehab;Long Term: Able to use THRR to govern intensity when exercising independently       Able to check pulse independently  Yes       Intervention  Provide education and demonstration on how to check pulse in carotid and radial arteries.;Review the importance of being able to check your own pulse for safety during independent exercise       Expected Outcomes  Short Term: Able to explain why pulse checking is important during independent exercise;Long Term: Able to check pulse independently and accurately       Understanding of Exercise Prescription  Yes       Intervention  Provide education, explanation, and written materials on patient's individual exercise prescription       Expected Outcomes  Short Term: Able to explain program exercise prescription;Long  Term: Able to explain home exercise prescription to exercise independently          Exercise Goals Re-Evaluation : Exercise Goals Re-Evaluation    Row Name 09/18/17 1020 10/02/17 1542 10/11/17 1023 10/16/17 1348 10/30/17 1515     Exercise Goal Re-Evaluation   Exercise Goals Review  Able to understand and use rate of perceived exertion (RPE) scale;Knowledge and understanding of Target Heart Rate Range (THRR);Understanding of Exercise Prescription;Increase Physical Activity  Increase Physical Activity;Understanding of Exercise Prescription;Increase Strength and Stamina  Increase Physical Activity;Understanding of Exercise Prescription;Increase Strength and Stamina  Increase Physical Activity;Understanding of Exercise Prescription;Increase Strength and Stamina  Increase Physical Activity;Understanding of Exercise Prescription;Increase Strength and Stamina   Comments  Reviewed RPE scale, THR and program prescription with pt today.  Pt voiced understanding and was given a copy of goals to take home.   Trevonn has been doing well in rehab.  He is now up to level 7 on the recumbent bike and 36 watts.  We will continue to monitor his progression.   Stpehen is doing well in rehab.  He is starting to feel stronger and has been able to start getting back to some of his normal activities.   He would like to continue to build his strength back up. Reviewed home exercise with pt today.  Pt plans to walk at home for exercise.  Reviewed THR, pulse, RPE, sign and symptoms, and when to call 911 or MD.  Also discussed weather considerations and indoor options.  Pt voiced understanding.  Eulises continues to do well in rehab.  He is up to 34 watts on the recumbent bike now.  We will try to add some incline to his treadmill.  We will continue to monitor his progress.   Haden has been doing well in rehab.  He was out sick last week. He is up to level 3 on the arm crank.  We will continue to monitor his progress.    Expected  Outcomes  Short: Use RPE daily to regulate intensity.  Long: Follow program prescription in THR.  Short: Increase workload on arm crank.  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   Short: Add in home exercise consistently.  Long: Continue to increase strength and stamina.   Short: Add incline to treadmill.  Long: Continue to exercise more at home.   Short: Continue to try to increase treadmill.  Long: Continue to work on adding in more strength and stamina.    [place] Name 11/13/17 1500             Exercise Goal Re-Evaluation   Exercise Goals Review  Increase Physical Activity;Understanding of Exercise Prescription;Increase Strength and Stamina       Comments  Bailey continues to do well in rehab.  He is now on level 9 on the recumbent bike and completed his post 6MWT today.  He improved by 288 ft!!  We will continue to monitor his progress towards graduation.        Expected Outcomes  Short: Continue to get closer to graduation.  Long: Continue to exercise on his off days.          Discharge Exercise Prescription (Final Exercise Prescription Changes): Exercise Prescription Changes - 11/13/17 1500  Response to Exercise   Blood Pressure (Admit)  126/62    Blood Pressure (Exercise)  144/62    Blood Pressure (Exit)  120/68    Heart Rate (Admit)  72 bpm    Heart Rate (Exercise)  87 bpm    Heart Rate (Exit)  51 bpm    Rating of Perceived Exertion (Exercise)  13    Symptoms  none    Duration  Continue with 30 min of aerobic exercise without signs/symptoms of physical distress.    Intensity  THRR unchanged      Progression   Progression  Continue to progress workloads to maintain intensity without signs/symptoms of physical distress.    Average METs  2.38      Resistance Training   Training Prescription  Yes    Weight  3 lbs    Reps  10-15      Interval Training   Interval Training  No      Treadmill   MPH  1.8    Grade  0    Minutes  15    METs  2.38       Recumbant Bike   Level  9    Watts  33    Minutes  15    METs  3.31      Arm Ergometer   Level  3    Minutes  15    METs  1.5      Home Exercise Plan   Plans to continue exercise at  [employer] walk at home    Frequency  Add 2 additional days to program exercise sessions.    Initial Home Exercises Provided  09/20/17       Nutrition:  Target Goals: Understanding of nutrition guidelines, daily intake of sodium '1500mg'$ , cholesterol '200mg'$ , calories 30% from fat and 7% or less from saturated fats, daily to have 5 or more servings of fruits and vegetables.  Biometrics: Pre Biometrics - 09/13/17 1452      Pre Biometrics   Height  '5\' 5"'$  (1.651 m)    Weight  175 lb 12.8 oz (79.7 kg)    Waist Circumference  39 inches    Hip Circumference  39 inches    Waist to Hip Ratio  1 %    BMI (Calculated)  29.25    Single Leg Stand  12.51 seconds      Post Biometrics - 11/13/17 0904       Post  Biometrics   Height  '5\' 5"'$  (1.651 m)    Weight  179 lb (81.2 kg)    Waist Circumference  37.5 inches    Hip Circumference  38 inches    Waist to Hip Ratio  0.99 %    BMI (Calculated)  29.79    Single Leg Stand  14.5 seconds       Nutrition Therapy Plan and Nutrition Goals: Nutrition Therapy & Goals - 09/27/17 0916      Nutrition Therapy   Diet  DASH    Drug/Food Interactions  Statins/Certain Fruits    Protein (specify units)  9oz    Fiber  30 grams    Whole Grain Foods  3 servings    Saturated Fats  15 max. grams    Fruits and Vegetables  6 servings/day 8 ideal    Sodium  2000 grams      Personal Nutrition Goals   Nutrition Goal  Monitor portion sizes more closely at meal times. Utilize the plate method: 1/2  plate vegetables, 1/4 plate protein, 1/4 plate starch he feels that the reason why he cannot lose weight is because he "eats too much" and finishes whatever portion of food his wife puts on his plate    Personal Goal #2  Continue to make more nutritious snack options and only  snack as needed between meal or in the evenings used to drink 1/2 gallon of whole milk + oreo cookies as a snack but has been working to choose options like PB + crackers instead and not snacking in the evenings    Personal Goal #3  When eating out, choose less fried / greasy food options. Order a side of vegetables or fruit to add fiber to the meal    Comments  His wife is very health-conscious and typically cooks nutritious meals at home; lean meat + 1-2 vegetables are always available. She does not cook with salt and reads nutrition labels for sodium and cholesterol content. He is very physically active outside of class      Intervention Plan   Intervention  Prescribe, educate and counsel regarding individualized specific dietary modifications aiming towards targeted core components such as weight, hypertension, lipid management, diabetes, heart failure and other comorbidities.    Expected Outcomes  Short Term Goal: A plan has been developed with personal nutrition goals set during dietitian appointment.;Long Term Goal: Adherence to prescribed nutrition plan.;Short Term Goal: Understand basic principles of dietary content, such as calories, fat, sodium, cholesterol and nutrients.       Nutrition Assessments: Nutrition Assessments - 11/20/17 1131      MEDFICTS Scores   Pre Score  54    Post Score  49    Score Difference  -5       Nutrition Goals Re-Evaluation: Nutrition Goals Re-Evaluation    Row Name 09/27/17 0924 10/11/17 1030 11/13/17 0841         Goals   Nutrition Goal  Monitor portion sizes more closely at meal times. Utilize the plate method: 1/2 plate vegetables, 1/4 plate protein, 1/4 plate starch  Monitor portion sizes more closely at meal times. Utilize the plate method: 1/2 plate vegetables, 1/4 plate protein, 1/4 plate starch, more fruits/vegetables, better snacks  -     Comment  He feels that the reason why he cannot lose weight is becuase he "eats too much" and will finish  whatever portion of food his wife puts on his plate  Emmanuel has been trying to work on portion control.  He has been eating out less as well.  He has also eliminated his snacking as of now.  He is trying to stay away from fried/greasy foods.  He has not really focused on the plate method, just currently working on cutting back.   Dashan would like to lose 4 lb.  He is still watching portion sizes.  He has Barretts esophagus and may have to have another procedure.         Expected Outcome  He will decrease portion sizes at meal times and opt for larger servings of vegetables and lean protein, and smaller servings of starch  Short: Continue to work on portion control.  Long: Continue to work on using plate method.   Short - Norval will continue to monitor portions Long - Haris will maintain weight at desired level       Personal Goal #2 Re-Evaluation   Personal Goal #2  When eating out, choose less fried/ greasy food options. Order a side of vegetables or  fruit to add fiber to the meal, and continue to split meals with your wife  -  -       Personal Goal #3 Re-Evaluation   Personal Goal #3  Continue to make more nutritious snack options and to only snack as needed between meals or in the evenings  -  -        Nutrition Goals Discharge (Final Nutrition Goals Re-Evaluation): Nutrition Goals Re-Evaluation - 11/13/17 0841      Goals   Comment  Brinson would like to lose 4 lb.  He is still watching portion sizes.  He has Barretts esophagus and may have to have another procedure.        Expected Outcome  Short - Ericberto will continue to monitor portions Long - Zevin will maintain weight at desired level       Psychosocial: Target Goals: Acknowledge presence or absence of significant depression and/or stress, maximize coping skills, provide positive support system. Participant is able to verbalize types and ability to use techniques and skills needed for reducing stress and depression.   Initial  Review & Psychosocial Screening: Initial Psych Review & Screening - 09/13/17 1434      Initial Review   Current issues with  Current Stress Concerns    Source of Stress Concerns  Unable to perform yard/household activities    Comments  He is ready to get back to his usual duties on his [place] acres. He is used to performing lots of yard work.       Family Dynamics   Good Support System?  Yes family      Barriers   Psychosocial barriers to participate in program  The patient should benefit from training in stress management and relaxation.;There are no identifiable barriers or psychosocial needs.      Screening Interventions   Interventions  Encouraged to exercise;Provide feedback about the scores to participant;Program counselor consult;To provide support and resources with identified psychosocial needs    Expected Outcomes  Short Term goal: Utilizing psychosocial counselor, staff and physician to assist with identification of specific Stressors or current issues interfering with healing process. Setting desired goal for each stressor or current issue identified.;Long Term Goal: Stressors or current issues are controlled or eliminated.;Short Term goal: Identification and review with participant of any Quality of Life or Depression concerns found by scoring the questionnaire.;Long Term goal: The participant improves quality of Life and PHQ9 Scores as seen by post scores and/or verbalization of changes       Quality of Life Scores:  Quality of Life - 11/20/17 1130      Quality of Life Scores   Health/Function Pre  26.8 %    Health/Function Post  26.43 %    Health/Function % Change  -1.38 %    Socioeconomic Pre  30 %    Socioeconomic Post  24.8 %    Socioeconomic % Change   -17.33 %    Psych/Spiritual Pre  30 %    Psych/Spiritual Post  30 %    Psych/Spiritual % Change  0 %    Family Pre  28.8 %    Family Post  28.3 %    Family % Change  -1.74 %    GLOBAL Pre  28.36 %    GLOBAL Post   27.25 %    GLOBAL % Change  -3.91 %      Scores of 19 and below usually indicate a poorer quality of life in these areas.  A  difference of  2-3 points is a clinically meaningful difference.  A difference of 2-3 points in the total score of the Quality of Life Index has been associated with significant improvement in overall quality of life, self-image, physical symptoms, and general health in studies assessing change in quality of life.  PHQ-9: Recent Review Flowsheet Data    Depression screen [place] 2/9 11/20/2017 09/13/2017   Decreased Interest 0 0   Down, Depressed, Hopeless 0 0   PHQ - 2 Score 0 0   Altered sleeping 0 3   Tired, decreased energy 0 2   Change in appetite 0 0   Feeling bad or failure about yourself  0 0   Trouble concentrating 0 0   Moving slowly or fidgety/restless 0 0   Suicidal thoughts 0 0   PHQ-9 Score 0 5   Difficult doing work/chores Not difficult at all Not difficult at all     Interpretation of Total Score  Total Score Depression Severity:  1-4 = Minimal depression, 5-9 = Mild depression, 10-14 = Moderate depression, 15-19 = Moderately severe depression, 20-27 = Severe depression   Psychosocial Evaluation and Intervention: Psychosocial Evaluation - 09/27/17 0945      Psychosocial Evaluation & Interventions   Interventions  Encouraged to exercise with the program and follow exercise prescription    Comments  Counselor met with Mr. Radin Keddie) today for initial psychosocial evaluation.  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  Xhaiden has a strong support system with a spouse of 23 years; a daughter and son who live locally; and he is actively involved in his local church.  Jcion reports some difficulty with interrupted sleep and he has a good appetite.  He denies a history of depression or anxiety or any current symptoms and is typically in a positive mood.  Jeneen Rinks has minimal stress in his life other than his health concerns.   He has goals to get back to his normal activities; increase his stamina and strength and lose some weight while in this program.  Staff will follow with him.    Expected Outcomes  Short:  Bernardo will meet with the dietician to address his weight loss goals.   Long:  He will exercise consistently to increase his stamina and strength and to return to normal activities.      Continue Psychosocial Services   Follow up required by staff       Psychosocial Re-Evaluation: Psychosocial Re-Evaluation    [place] Name 10/11/17 1038 11/13/17 0853           Psychosocial Re-Evaluation   Current issues with  Current Stress Concerns  Current Stress Concerns      Comments  Kavish has been doing well in rehab.  He continues to do well mentally.  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 He continues to sleep better.  He is feeling better overall and is stronger than when he started.    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  He doesn't report any new stress.      Expected Outcomes  Short: Continue to attend rehab to build strength and stamina. Long: Continue to remain positive.   Short  - Pt will continue to exercise and use stress mgmt techniques to keep stress low. 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  He doesn't report any new stress.    Expected Outcomes  Short  - Pt will continue to exercise and use stress mgmt techniques to keep stress low.  Long - pt will manage stress successfully        Vocational Rehabilitation: Provide vocational rehab assistance to qualifying candidates.   Vocational Rehab Evaluation & Intervention: Vocational Rehab - 09/13/17 1442      Initial Vocational Rehab Evaluation & Intervention   Assessment shows need for Vocational Rehabilitation  No       Education: Education Goals: Education classes will be provided on a variety of topics geared toward better understanding of heart health and risk factor modification. Participant will state understanding/return demonstration of topics presented as noted by education test scores.  Learning Barriers/Preferences: Learning Barriers/Preferences - 09/13/17 1441      Learning Barriers/Preferences   Learning Barriers  Hearing    Learning Preferences  None       Education Topics:  AED/CPR: - Group verbal and written instruction with the use of models to demonstrate the basic use of the AED with the basic ABC's of resuscitation.   Cardiac Rehab from 11/29/2017 in [facility] Cardiac and Pulmonary Rehab  Date  10/25/17  Educator  CE  Instruction Review Code  1- Verbalizes Understanding      General Nutrition Guidelines/Fats and Fiber: -Group instruction provided by verbal, written material, models and posters to present the general guidelines for heart healthy nutrition. Gives an explanation and review of dietary fats and fiber.   Controlling Sodium/Reading Food Labels: -Group verbal and written material supporting the discussion of sodium use in heart healthy nutrition. Review and explanation with models, verbal and written materials for utilization of the food label.   Cardiac Rehab from 11/29/2017 in [facility] Cardiac and Pulmonary Rehab  Date  10/30/17  Educator  PI  Instruction Review Code  1- Verbalizes Understanding      Exercise Physiology & General Exercise Guidelines: - Group verbal and written instruction with models to review the exercise  physiology of the cardiovascular system and associated critical values. Provides general exercise guidelines with specific guidelines to those with heart or lung disease.    Cardiac Rehab from 11/29/2017 in [facility] Cardiac and Pulmonary Rehab  Date  11/06/17  Educator  AS  Instruction Review Code  1- Verbalizes Understanding      Aerobic Exercise & Resistance Training: - Gives group verbal and written instruction on the various components of exercise. Focuses on aerobic and resistive training programs and the benefits of this training and how to safely progress through these programs..   Cardiac Rehab from 11/29/2017 in [facility] Cardiac and Pulmonary Rehab  Date  11/20/17  Educator  [facility]  Instruction Review Code  1- Verbalizes Understanding      Flexibility, Balance, Mind/Body Relaxation: Provides group verbal/written instruction on the benefits of flexibility and balance training, including mind/body exercise modes such as yoga, pilates and tai chi.  Demonstration and skill practice provided.   Cardiac Rehab  from 11/29/2017 in [facility] Cardiac and Pulmonary Rehab  Date  09/25/17  Educator  AS  Instruction Review Code  1- Verbalizes Understanding      Stress and Anxiety: - Provides group verbal and written instruction about the health risks of elevated stress and causes of high stress.  Discuss the correlation between heart/lung disease and anxiety and treatment options. Review healthy ways to manage with stress and anxiety.   Depression: - Provides group verbal and written instruction on the correlation between heart/lung disease and depressed mood, treatment options, and the stigmas associated with seeking treatment.   Cardiac Rehab from 11/29/2017 in [facility] Cardiac and Pulmonary Rehab  Date  11/13/17  Educator  [facility]  Instruction Review Code  1- Verbalizes Understanding      Anatomy & Physiology of the Heart: - Group verbal and written instruction and models provide basic cardiac anatomy and  physiology, with the coronary electrical and arterial systems. Review of Valvular disease and Heart Failure   Cardiac Procedures: - Group verbal and written instruction to review commonly prescribed medications for heart disease. Reviews the medication, class of the drug, and side effects. Includes the steps to properly store meds and maintain the prescription regimen. (beta blockers and nitrates)   Cardiac Rehab from 11/29/2017 in [facility] Cardiac and Pulmonary Rehab  Date  10/18/17  Educator  CE  Instruction Review Code  1- Verbalizes Understanding      Cardiac Medications I: - Group verbal and written instruction to review commonly prescribed medications for heart disease. Reviews the medication, class of the drug, and side effects. Includes the steps to properly store meds and maintain the prescription regimen.   Cardiac Rehab from 11/29/2017 in [facility] Cardiac and Pulmonary Rehab  Date  11/27/17  Educator  SB  Instruction Review Code  1- Verbalizes Understanding      Cardiac Medications II: -Group verbal and written instruction to review commonly prescribed medications for heart disease. Reviews the medication, class of the drug, and side effects. (all other drug classes)   Cardiac Rehab from 11/29/2017 in [facility] Cardiac and Pulmonary Rehab  Date  11/15/17  Educator  CE  Instruction Review Code  1- Verbalizes Understanding       Go Sex-Intimacy & Heart Disease, Get SMART - Goal Setting: - Group verbal and written instruction through game format to discuss heart disease and the return to sexual intimacy. Provides group verbal and written material to discuss and apply goal setting through the application of the S.M.A.R.T. Method.   Cardiac Rehab from 11/29/2017 in [facility] Cardiac and Pulmonary Rehab  Date  10/18/17  Educator  CE  Instruction Review Code  1- Verbalizes Understanding      Other Matters of the Heart: - Provides group verbal, written materials and models to describe Stable  Angina and Peripheral Artery. Includes description of the disease process and treatment options available to the cardiac patient.   Exercise & Equipment Safety: - Individual verbal instruction and demonstration of equipment use and safety with use of the equipment.   Cardiac Rehab from 11/29/2017 in [facility] Cardiac and Pulmonary Rehab  Date  09/13/17  Educator  [facility]  Instruction Review Code  1- Verbalizes Understanding      Infection Prevention: - Provides verbal and written material to individual with discussion of infection control including proper hand washing and proper equipment cleaning during exercise session.   Cardiac Rehab from 11/29/2017 in [facility] Cardiac and Pulmonary Rehab  Date  09/13/17  Educator  [facility]  Instruction  Review Code  1- Verbalizes Understanding      Falls Prevention: - Provides verbal and written material to individual with discussion of falls prevention and safety.   Cardiac Rehab from 11/29/2017 in [facility] Cardiac and Pulmonary Rehab  Date  09/13/17  Educator  [facility]  Instruction Review Code  1- Verbalizes Understanding      Diabetes: - Individual verbal and written instruction to review signs/symptoms of diabetes, desired ranges of glucose level fasting, after meals and with exercise. Acknowledge that pre and post exercise glucose checks will be done for 3 sessions at entry of program.   Know Your Numbers and Risk Factors: -Group verbal and written instruction about important numbers in your health.  Discussion of what are risk factors and how they play a role in the disease process.  Review of Cholesterol, Blood Pressure, Diabetes, and BMI and the role they play in your overall health.   Cardiac Rehab from 11/29/2017 in [facility] Cardiac and Pulmonary Rehab  Date  11/15/17  Educator  CE  Instruction Review Code  1- Verbalizes Understanding      Sleep Hygiene: -Provides group verbal and written instruction about how sleep can affect your health.  Define sleep hygiene,  discuss sleep cycles and impact of sleep habits. Review good sleep hygiene tips.    Cardiac Rehab from 11/29/2017 in [facility] Cardiac and Pulmonary Rehab  Date  10/16/17  Educator  [facility]  Instruction Review Code  1- Verbalizes Understanding      Other: -Provides group and verbal instruction on various topics (see comments)   Knowledge Questionnaire Score: Knowledge Questionnaire Score - 11/20/17 1130      Knowledge Questionnaire Score   Pre Score  23/28    Post Score  22/28       Core Components/Risk Factors/Patient Goals at Admission: Personal Goals and Risk Factors at Admission - 09/13/17 1433      Core Components/Risk Factors/Patient Goals on Admission    Weight Management  Yes;Weight Maintenance    Intervention  Weight Management: Develop a combined nutrition and exercise program designed to reach desired caloric intake, while maintaining appropriate intake of nutrient and fiber, sodium and fats, and appropriate energy expenditure required for the weight goal.;Weight Management: Provide education and appropriate resources to help participant work on and attain dietary goals.    Admit Weight  175 lb (79.4 kg)    Expected Outcomes  Short Term: Continue to assess and modify interventions until short term weight is achieved;Long Term: Adherence to nutrition and physical activity/exercise program aimed toward attainment of established weight goal;Weight Maintenance: Understanding of the daily nutrition guidelines, which includes 25-35% calories from fat, 7% or less cal from saturated fats, less than '200mg'$  cholesterol, less than 1.5gm of sodium, & 5 or more servings of fruits and vegetables daily;Understanding recommendations for meals to include 15-35% energy as protein, 25-35% energy from fat, 35-60% energy from carbohydrates, less than '200mg'$  of dietary cholesterol, 20-35 gm of total fiber daily;Understanding of distribution of calorie intake throughout the day with the consumption of 4-5  meals/snacks    Hypertension  Yes    Intervention  Provide education on lifestyle modifcations including regular physical activity/exercise, weight management, moderate sodium restriction and increased consumption of fresh fruit, vegetables, and low fat dairy, alcohol moderation, and smoking cessation.;Monitor prescription use compliance.    Expected Outcomes  Short Term: Continued assessment and intervention until BP is < 140/62m HG in hypertensive participants. < 130/822mHG in hypertensive participants with diabetes, heart failure or chronic  kidney disease.;Long Term: Maintenance of blood pressure at goal levels.    Lipids  Yes    Intervention  Provide education and support for participant on nutrition & aerobic/resistive exercise along with prescribed medications to achieve LDL '70mg'$ , HDL >'40mg'$ .    Expected Outcomes  Short Term: Participant states understanding of desired cholesterol values and is compliant with medications prescribed. Participant is following exercise prescription and nutrition guidelines.;Long Term: Cholesterol controlled with medications as prescribed, with individualized exercise RX and with personalized nutrition plan. Value goals: LDL < '70mg'$ , HDL > 40 mg.       Core Components/Risk Factors/Patient Goals Review:  Goals and Risk Factor Review    Row Name 10/11/17 1025 11/13/17 0845           Core Components/Risk Factors/Patient Goals Review   Personal Goals Review  Weight Management/Obesity;Hypertension;Lipids  Weight Management/Obesity;Lipids;Hypertension      Review  Estelle has been doing well in rehab.  He is holding steady on his weight.  After meeting with dietician, he realized that he needs to work on portion control to help with his weight loss.  He lost 15 lbs in the hospital and is starting to gain it back.  His blood pressures have been good and run in the 120s at home when he checks them.  He is doing well on his medications.   Kiah hasnt lost weight but  can wear clothes he wasnt able to before.  BP stays betweem 116-132 at home.  He is taking meds as directed.  His Dr took him off some meds since his numbes has been good.  He got a good report from his cardiologist.  He wants to finish HT and plans to walk and possibly join silver Sneakers program.       Expected Outcomes  Short: Continue to work on weight loss.  Long: Continue to work on risk factors.   Short - Kire will finish HT Long - Aniken will maintain exercise and healthy dietary habits on his own         Core Components/Risk Factors/Patient Goals at Discharge (Final Review):  Goals and Risk Factor Review - 11/13/17 0845      Core Components/Risk Factors/Patient Goals Review   Personal Goals Review  Weight Management/Obesity;Lipids;Hypertension    Review  Zaim hasnt lost weight but can wear clothes he wasnt able to before.  BP stays betweem 116-132 at home.  He is taking meds as directed.  His Dr took him off some meds since his numbes has been good.  He got a good report from his cardiologist.  He wants to finish HT and plans to walk and possibly join silver Sneakers program.     Expected Outcomes  Short - Vinton will finish HT Long - Alcides will maintain exercise and healthy dietary habits on his own       ITP Comments: ITP Comments    Row Name 09/13/17 1421 09/26/17 0546 10/24/17 0609 11/21/17 0609 11/29/17 0811   ITP Comments  Med Review completed. Initial ITP created. Diagnosis can be found in Care Everywhere 08/06/17  30 day review. Continue with ITP unless directed changes per Medical Director  New to program  30 day review. Continue with ITP unless directed changes per Medical Director  30 day review. Continue with ITP unless directed changes per Medical Director review   3 visits in June  Discharge ITP sent and signed by Dr. Sabra Heck.  Discharge Summary routed to PCP and cardiologist.  Comments: Discharge ITP

## 2017-11-29 NOTE — Progress Notes (Signed)
Discharge Progress Report  Patient Details  Name: Warren Ingram MRN: [id number] Date of Birth: 1940/09/07 Referring Provider:     Cardiac Rehab from 09/13/2017 in [facility]  Referring Provider  Ikonomidis       Number of Visits: 35  Reason for Discharge:  Patient reached a stable level of exercise. Patient independent in their exercise. Patient has met program and personal goals.  Smoking History:  Social History   Tobacco Use  Smoking Status Former Smoker  . Packs/day: 1.00  . Last attempt to quit: 1985  . Years since quitting: 34.5  Smokeless Tobacco Never Used    Diagnosis:  S/P CABG x 4  ADL UCSD:   Initial Exercise Prescription: Initial Exercise Prescription - 09/13/17 1400      Date of Initial Exercise RX and Referring Provider   Date  09/13/17    Referring Provider  Ikonomidis      Treadmill   MPH  1.8    Grade  0    Minutes  15    METs  2.5      Recumbant Bike   Level  3    RPM  60    Watts  10    Minutes  15    METs  2.5      Arm Ergometer   Level  1    RPM  40    Minutes  15    METs  2.5      T5 Nustep   Level  1    SPM  80    Minutes  15    METs  2.5      Prescription Details   Frequency (times per week)  3    Duration  Progress to 30 minutes of continuous aerobic without signs/symptoms of physical distress      Intensity   THRR 40-80% of Max Heartrate  95-128    Ratings of Perceived Exertion  11-13    Perceived Dyspnea  0-4      Resistance Training   Training Prescription  Yes    Weight  3 lb    Reps  10-15       Discharge Exercise Prescription (Final Exercise Prescription Changes): Exercise Prescription Changes - 11/13/17 1500      Response to Exercise   Blood Pressure (Admit)  126/62    Blood Pressure (Exercise)  144/62    Blood Pressure (Exit)  120/68    Heart Rate (Admit)  72 bpm    Heart Rate (Exercise)  87 bpm    Heart Rate (Exit)  51 bpm    Rating of Perceived Exertion  (Exercise)  13    Symptoms  none    Duration  Continue with 30 min of aerobic exercise without signs/symptoms of physical distress.    Intensity  THRR unchanged      Progression   Progression  Continue to progress workloads to maintain intensity without signs/symptoms of physical distress.    Average METs  2.38      Resistance Training   Training Prescription  Yes    Weight  3 lbs    Reps  10-15      Interval Training   Interval Training  No      Treadmill   MPH  1.8    Grade  0    Minutes  15    METs  2.38      Recumbant Bike   Level  9  Watts  33    Minutes  15    METs  3.31      Arm Ergometer   Level  3    Minutes  15    METs  1.5      Home Exercise Plan   Plans to continue exercise at  [employer] walk at home    Frequency  Add 2 additional days to program exercise sessions.    Initial Home Exercises Provided  09/20/17       Functional Capacity: 6 Minute Walk    Row Name 09/13/17 1452 11/13/17 0849       6 Minute Walk   Phase  -  Discharge    Distance  1142 feet  1430 feet    Distance Feet Change  -  288 ft    Walk Time  6 minutes  6 minutes    # of Rest Breaks  0  0    MPH  2.16  2.7    METS  2.37  2.45    RPE  12  11    Perceived Dyspnea   1  1    VO2 Peak  8.3  8.5    Symptoms  No  No    Resting HR  61 bpm  58 bpm    Resting BP  134/80  126/68    Resting Oxygen Saturation   97 %  99 %    Exercise Oxygen Saturation  during 6 min walk  99 %  98 %    Max Ex. HR  93 bpm  69 bpm    Max Ex. BP  164/70  144/62    2 Minute Post BP  132/66  124/70       Psychological, QOL, Others - Outcomes: PHQ 2/9: Depression screen [facility] 2/9 11/20/2017 09/13/2017  Decreased Interest 0 0  Down, Depressed, Hopeless 0 0  PHQ - 2 Score 0 0  Altered sleeping 0 3  Tired, decreased energy 0 2  Change in appetite 0 0  Feeling bad or failure about yourself  0 0  Trouble concentrating 0 0  Moving slowly or fidgety/restless 0 0  Suicidal thoughts 0 0  PHQ-9 Score 0  5  Difficult doing work/chores Not difficult at all Not difficult at all    Quality of Life: Quality of Life - 11/20/17 1130      Quality of Life Scores   Health/Function Pre  26.8 %    Health/Function Post  26.43 %    Health/Function % Change  -1.38 %    Socioeconomic Pre  30 %    Socioeconomic Post  24.8 %    Socioeconomic % Change   -17.33 %    Psych/Spiritual Pre  30 %    Psych/Spiritual Post  30 %    Psych/Spiritual % Change  0 %    Family Pre  28.8 %    Family Post  28.3 %    Family % Change  -1.74 %    GLOBAL Pre  28.36 %    GLOBAL Post  27.25 %    GLOBAL % Change  -3.91 %       Personal Goals: Goals established at orientation with interventions provided to work toward goal. Personal Goals and Risk Factors at Admission - 09/13/17 1433      Core Components/Risk Factors/Patient Goals on Admission    Weight Management  Yes;Weight Maintenance    Intervention  Weight Management: Develop a combined nutrition and  exercise program designed to reach desired caloric intake, while maintaining appropriate intake of nutrient and fiber, sodium and fats, and appropriate energy expenditure required for the weight goal.;Weight Management: Provide education and appropriate resources to help participant work on and attain dietary goals.    Admit Weight  175 lb (79.4 kg)    Expected Outcomes  Short Term: Continue to assess and modify interventions until short term weight is achieved;Long Term: Adherence to nutrition and physical activity/exercise program aimed toward attainment of established weight goal;Weight Maintenance: Understanding of the daily nutrition guidelines, which includes 25-35% calories from fat, 7% or less cal from saturated fats, less than '200mg'$  cholesterol, less than 1.5gm of sodium, & 5 or more servings of fruits and vegetables daily;Understanding recommendations for meals to include 15-35% energy as protein, 25-35% energy from fat, 35-60% energy from carbohydrates, less than  '200mg'$  of dietary cholesterol, 20-35 gm of total fiber daily;Understanding of distribution of calorie intake throughout the day with the consumption of 4-5 meals/snacks    Hypertension  Yes    Intervention  Provide education on lifestyle modifcations including regular physical activity/exercise, weight management, moderate sodium restriction and increased consumption of fresh fruit, vegetables, and low fat dairy, alcohol moderation, and smoking cessation.;Monitor prescription use compliance.    Expected Outcomes  Short Term: Continued assessment and intervention until BP is < 140/85m HG in hypertensive participants. < 130/8107mHG in hypertensive participants with diabetes, heart failure or chronic kidney disease.;Long Term: Maintenance of blood pressure at goal levels.    Lipids  Yes    Intervention  Provide education and support for participant on nutrition & aerobic/resistive exercise along with prescribed medications to achieve LDL '70mg'$ , HDL >'40mg'$ .    Expected Outcomes  Short Term: Participant states understanding of desired cholesterol values and is compliant with medications prescribed. Participant is following exercise prescription and nutrition guidelines.;Long Term: Cholesterol controlled with medications as prescribed, with individualized exercise RX and with personalized nutrition plan. Value goals: LDL < '70mg'$ , HDL > 40 mg.        Personal Goals Discharge: Goals and Risk Factor Review    Row Name 10/11/17 1025 11/13/17 0845           Core Components/Risk Factors/Patient Goals Review   Personal Goals Review  Weight Management/Obesity;Hypertension;Lipids  Weight Management/Obesity;Lipids;Hypertension      Review  LeMalekas been doing well in rehab.  He is holding steady on his weight.  After meeting with dietician, he realized that he needs to work on portion control to help with his weight loss.  He lost 15 lbs in the hospital and is starting to gain it back.  His blood pressures have  been good and run in the 120s at home when he checks them.  He is doing well on his medications.   Vernor hasnt lost weight but can wear clothes he wasnt able to before.  BP stays betweem 116-132 at home.  He is taking meds as directed.  His Dr took him off some meds since his numbes has been good.  He got a good report from his cardiologist.  He wants to finish HT and plans to walk and possibly join [employer] Sneakers program.       Expected Outcomes  Short: Continue to work on weight loss.  Long: Continue to work on risk factors.   Short - LeReasonill finish HT Long - LeShaulill maintain exercise and healthy dietary habits on his own  Exercise Goals and Review: Exercise Goals    Row Name 09/13/17 1452             Exercise Goals   Increase Physical Activity  Yes       Intervention  Provide advice, education, support and counseling about physical activity/exercise needs.;Develop an individualized exercise prescription for aerobic and resistive training based on initial evaluation findings, risk stratification, comorbidities and participant's personal goals.       Expected Outcomes  Short Term: Attend rehab on a regular basis to increase amount of physical activity.;Long Term: Add in home exercise to make exercise part of routine and to increase amount of physical activity.;Long Term: Exercising regularly at least 3-5 days a week.       Increase Strength and Stamina  Yes       Intervention  Provide advice, education, support and counseling about physical activity/exercise needs.;Develop an individualized exercise prescription for aerobic and resistive training based on initial evaluation findings, risk stratification, comorbidities and participant's personal goals.       Expected Outcomes  Short Term: Increase workloads from initial exercise prescription for resistance, speed, and METs.;Long Term: Improve cardiorespiratory fitness, muscular endurance and strength as measured by increased  METs and functional capacity (6MWT);Short Term: Perform resistance training exercises routinely during rehab and add in resistance training at home       Able to understand and use rate of perceived exertion (RPE) scale  Yes       Intervention  Provide education and explanation on how to use RPE scale       Expected Outcomes  Short Term: Able to use RPE daily in rehab to express subjective intensity level;Long Term:  Able to use RPE to guide intensity level when exercising independently       Able to understand and use Dyspnea scale  Yes       Intervention  Provide education and explanation on how to use Dyspnea scale       Expected Outcomes  Short Term: Able to use Dyspnea scale daily in rehab to express subjective sense of shortness of breath during exertion;Long Term: Able to use Dyspnea scale to guide intensity level when exercising independently       Knowledge and understanding of Target Heart Rate Range (THRR)  Yes       Intervention  Provide education and explanation of THRR including how the numbers were predicted and where they are located for reference       Expected Outcomes  Short Term: Able to state/look up THRR;Short Term: Able to use daily as guideline for intensity in rehab;Long Term: Able to use THRR to govern intensity when exercising independently       Able to check pulse independently  Yes       Intervention  Provide education and demonstration on how to check pulse in carotid and radial arteries.;Review the importance of being able to check your own pulse for safety during independent exercise       Expected Outcomes  Short Term: Able to explain why pulse checking is important during independent exercise;Long Term: Able to check pulse independently and accurately       Understanding of Exercise Prescription  Yes       Intervention  Provide education, explanation, and written materials on patient's individual exercise prescription       Expected Outcomes  Short Term: Able to  explain program exercise prescription;Long Term: Able to explain home exercise prescription to exercise independently  Nutrition & Weight - Outcomes: Pre Biometrics - 09/13/17 1452      Pre Biometrics   Height  '5\' 5"'$  (1.651 m)    Weight  175 lb 12.8 oz (79.7 kg)    Waist Circumference  39 inches    Hip Circumference  39 inches    Waist to Hip Ratio  1 %    BMI (Calculated)  29.25    Single Leg Stand  12.51 seconds      Post Biometrics - 11/13/17 0904       Post  Biometrics   Height  '5\' 5"'$  (1.651 m)    Weight  179 lb (81.2 kg)    Waist Circumference  37.5 inches    Hip Circumference  38 inches    Waist to Hip Ratio  0.99 %    BMI (Calculated)  29.79    Single Leg Stand  14.5 seconds       Nutrition: Nutrition Therapy & Goals - 09/27/17 0916      Nutrition Therapy   Diet  DASH    Drug/Food Interactions  Statins/Certain Fruits    Protein (specify units)  9oz    Fiber  30 grams    Whole Grain Foods  3 servings    Saturated Fats  15 max. grams    Fruits and Vegetables  6 servings/day 8 ideal    Sodium  2000 grams      Personal Nutrition Goals   Nutrition Goal  Monitor portion sizes more closely at meal times. Utilize the plate method: 1/2 plate vegetables, 1/4 plate protein, 1/4 plate starch he feels that the reason why he cannot lose weight is because he "eats too much" and finishes whatever portion of food his wife puts on his plate    Personal Goal #2  Continue to make more nutritious snack options and only snack as needed between meal or in the evenings used to drink 1/2 gallon of whole milk + oreo cookies as a snack but has been working to choose options like PB + crackers instead and not snacking in the evenings    Personal Goal #3  When eating out, choose less fried / greasy food options. Order a side of vegetables or fruit to add fiber to the meal    Comments  His wife is very health-conscious and typically cooks nutritious meals at home; lean meat + 1-2  vegetables are always available. She does not cook with salt and reads nutrition labels for sodium and cholesterol content. He is very physically active outside of class      Intervention Plan   Intervention  Prescribe, educate and counsel regarding individualized specific dietary modifications aiming towards targeted core components such as weight, hypertension, lipid management, diabetes, heart failure and other comorbidities.    Expected Outcomes  Short Term Goal: A plan has been developed with personal nutrition goals set during dietitian appointment.;Long Term Goal: Adherence to prescribed nutrition plan.;Short Term Goal: Understand basic principles of dietary content, such as calories, fat, sodium, cholesterol and nutrients.       Nutrition Discharge: Nutrition Assessments - 11/20/17 1131      MEDFICTS Scores   Pre Score  54    Post Score  49    Score Difference  -5       Education Questionnaire Score: Knowledge Questionnaire Score - 11/20/17 1130      Knowledge Questionnaire Score   Pre Score  23/28    Post Score  22/28  Goals reviewed with patient; copy given to patient.

## 2017-11-29 NOTE — Progress Notes (Signed)
Daily Session Note  Patient Details  Name: Warren Ingram MRN: [id number] Date of Birth: 09/24/1940 Referring Provider:     Cardiac Rehab from 09/13/2017 in [facility]  Referring Provider  Ikonomidis      Encounter Date: 11/29/2017  Check In: Session Check In - 11/29/17 0808      Check-In   Location  [facility]-Cardiac & Pulmonary Rehab    Staff Present  Joseph Hood RCP,RRT,BSRT;Jessica Hawkins, MA, RCEP, CCRP, Exercise Physiologist;Carroll Enterkin, RN, BSN    Supervising physician immediately available to respond to emergencies  See telemetry face sheet for immediately available ER MD    Medication changes reported      No    Fall or balance concerns reported     No    Tobacco Cessation  No Change    Warm-up and Cool-down  Performed on first and last piece of equipment    Resistance Training Performed  Yes    VAD Patient?  No    PAD/SET Patient?  No      Pain Assessment   Currently in Pain?  No/denies          Social History   Tobacco Use  Smoking Status Former Smoker  . Packs/day: 1.00  . Last attempt to quit: 1985  . Years since quitting: 34.5  Smokeless Tobacco Never Used    Goals Met:  Proper associated with RPD/PD & O2 Sat Independence with exercise equipment Exercise tolerated well No report of cardiac concerns or symptoms Strength training completed today  Goals Unmet:  Not Applicable  Comments:  Warren Ingram graduated today from  rehab with 36 sessions completed.  Details of the patient's exercise prescription and what He needs to do in order to continue the prescription and progress were discussed with patient.  Patient was given a copy of prescription and goals.  Patient verbalized understanding.  Warren Ingram plans to continue to exercise by walking at home.   Dr. Mark Miller is Medical Director for [facility] and [facility]. 

## 2017-12-30 ENCOUNTER — Other Ambulatory Visit: Payer: Self-pay

## 2017-12-30 ENCOUNTER — Inpatient Hospital Stay
Admission: EM | Admit: 2017-12-30 | Discharge: 2018-01-01 | DRG: 919 | Disposition: A | Discharge status: Home / Self Care | Payer: Medicare PPO | Attending: Internal Medicine | Admitting: Internal Medicine

## 2017-12-30 DIAGNOSIS — E039 Hypothyroidism, unspecified: Secondary | ICD-10-CM | POA: Diagnosis present

## 2017-12-30 DIAGNOSIS — K921 Melena: Secondary | ICD-10-CM | POA: Diagnosis not present

## 2017-12-30 DIAGNOSIS — K91840 Postprocedural hemorrhage and hematoma of a digestive system organ or structure following a digestive system procedure: Secondary | ICD-10-CM | POA: Diagnosis present

## 2017-12-30 DIAGNOSIS — Z86010 Personal history of colonic polyps: Secondary | ICD-10-CM | POA: Diagnosis not present

## 2017-12-30 DIAGNOSIS — Y838 Other surgical procedures as the cause of abnormal reaction of the patient, or of later complication, without mention of misadventure at the time of the procedure: Secondary | ICD-10-CM | POA: Diagnosis present

## 2017-12-30 DIAGNOSIS — K5731 Diverticulosis of large intestine without perforation or abscess with bleeding: Secondary | ICD-10-CM | POA: Diagnosis present

## 2017-12-30 DIAGNOSIS — K625 Hemorrhage of anus and rectum: Secondary | ICD-10-CM | POA: Diagnosis present

## 2017-12-30 DIAGNOSIS — Z87891 Personal history of nicotine dependence: Secondary | ICD-10-CM | POA: Diagnosis not present

## 2017-12-30 DIAGNOSIS — Z8673 Personal history of transient ischemic attack (TIA), and cerebral infarction without residual deficits: Secondary | ICD-10-CM

## 2017-12-30 DIAGNOSIS — D122 Benign neoplasm of ascending colon: Secondary | ICD-10-CM | POA: Diagnosis not present

## 2017-12-30 DIAGNOSIS — I2510 Atherosclerotic heart disease of native coronary artery without angina pectoris: Secondary | ICD-10-CM | POA: Diagnosis present

## 2017-12-30 DIAGNOSIS — K2270 Barrett's esophagus without dysplasia: Secondary | ICD-10-CM | POA: Diagnosis present

## 2017-12-30 DIAGNOSIS — K219 Gastro-esophageal reflux disease without esophagitis: Secondary | ICD-10-CM | POA: Diagnosis present

## 2017-12-30 DIAGNOSIS — R55 Syncope and collapse: Secondary | ICD-10-CM

## 2017-12-30 DIAGNOSIS — D120 Benign neoplasm of cecum: Secondary | ICD-10-CM | POA: Diagnosis present

## 2017-12-30 DIAGNOSIS — E7800 Pure hypercholesterolemia, unspecified: Secondary | ICD-10-CM | POA: Diagnosis present

## 2017-12-30 DIAGNOSIS — M1990 Unspecified osteoarthritis, unspecified site: Secondary | ICD-10-CM | POA: Diagnosis present

## 2017-12-30 DIAGNOSIS — I10 Essential (primary) hypertension: Secondary | ICD-10-CM | POA: Diagnosis present

## 2017-12-30 LAB — CBC WITH DIFFERENTIAL/PLATELET
BASOS ABS: 0 10*3/uL (ref 0–0.1)
BASOS PCT: 0 %
Eosinophils Absolute: 0.2 10*3/uL (ref 0–0.7)
Eosinophils Relative: 3 %
HEMATOCRIT: 39.4 % — AB (ref 40.0–52.0)
Hemoglobin: 13.4 g/dL (ref 13.0–18.0)
Lymphocytes Relative: 23 %
Lymphs Abs: 1.7 10*3/uL (ref 1.0–3.6)
MCH: 29.7 pg (ref 26.0–34.0)
MCHC: 34.1 g/dL (ref 32.0–36.0)
MCV: 87 fL (ref 80.0–100.0)
MONO ABS: 0.5 10*3/uL (ref 0.2–1.0)
Monocytes Relative: 7 %
NEUTROS PCT: 67 %
Neutro Abs: 5.1 10*3/uL (ref 1.4–6.5)
PLATELETS: 202 10*3/uL (ref 150–440)
RBC: 4.52 MIL/uL (ref 4.40–5.90)
RDW: 16.1 % — AB (ref 11.5–14.5)
WBC: 7.6 10*3/uL (ref 3.8–10.6)

## 2017-12-30 LAB — COMPREHENSIVE METABOLIC PANEL
ALT: 29 U/L (ref 0–44)
ANION GAP: 8 (ref 5–15)
AST: 20 U/L (ref 15–41)
Albumin: 3.8 g/dL (ref 3.5–5.0)
Alkaline Phosphatase: 105 U/L (ref 38–126)
BILIRUBIN TOTAL: 0.6 mg/dL (ref 0.3–1.2)
BUN: 18 mg/dL (ref 8–23)
CO2: 30 mmol/L (ref 22–32)
Calcium: 8.7 mg/dL — ABNORMAL LOW (ref 8.9–10.3)
Chloride: 104 mmol/L (ref 98–111)
Creatinine, Ser: 1.08 mg/dL (ref 0.61–1.24)
Glucose, Bld: 118 mg/dL — ABNORMAL HIGH (ref 70–99)
POTASSIUM: 3.4 mmol/L — AB (ref 3.5–5.1)
Sodium: 142 mmol/L (ref 135–145)
TOTAL PROTEIN: 7 g/dL (ref 6.5–8.1)

## 2017-12-30 LAB — PROTIME-INR
INR: 0.97
Prothrombin Time: 12.8 seconds (ref 11.4–15.2)

## 2017-12-30 LAB — APTT: aPTT: 26 seconds (ref 24–36)

## 2017-12-30 LAB — LIPASE, BLOOD: LIPASE: 37 U/L (ref 11–51)

## 2017-12-30 LAB — ABO/RH: ABO/RH(D): A POS

## 2017-12-30 MED ORDER — SODIUM CHLORIDE 0.9 % IV SOLN
INTRAVENOUS | Status: DC
  Administered 2017-12-30: 23:00:00 via INTRAVENOUS

## 2017-12-30 MED ORDER — ACETAMINOPHEN 325 MG PO TABS: 650.0000 mg | ORAL_TABLET | Freq: Four times a day (QID) | ORAL | Status: DC | PRN

## 2017-12-30 MED ORDER — ONDANSETRON HCL 4 MG/2ML IJ SOLN: 4.0000 mg | Freq: Four times a day (QID) | INTRAMUSCULAR | Status: DC | PRN

## 2017-12-30 MED ORDER — SODIUM CHLORIDE 0.9 % IV SOLN: 10.0000 mL/h | Freq: Once | INTRAVENOUS | Status: DC

## 2017-12-30 MED ORDER — SODIUM CHLORIDE 0.9 % IV BOLUS
1000.0000 mL | Freq: Once | INTRAVENOUS | Status: AC
  Administered 2017-12-30: 1000 mL via INTRAVENOUS

## 2017-12-30 MED ORDER — TRANEXAMIC ACID 1000 MG/10ML IV SOLN
1000.0000 mg | Freq: Once | INTRAVENOUS | Status: AC
  Administered 2017-12-30: 1000 mg via INTRAVENOUS
  Filled 2017-12-30: qty 1100

## 2017-12-30 MED ORDER — ONDANSETRON HCL 4 MG PO TABS: 4.0000 mg | ORAL_TABLET | Freq: Four times a day (QID) | ORAL | Status: DC | PRN

## 2017-12-30 MED ORDER — ACETAMINOPHEN 650 MG RE SUPP: 650.0000 mg | Freq: Four times a day (QID) | RECTAL | Status: DC | PRN

## 2017-12-30 NOTE — H&P (Signed)
Warren Ingram NAME: Warren Ingram    MR#:  [id number]  DATE OF BIRTH:  Apr 17, 1941  DATE OF ADMISSION:  12/30/2017  PRIMARY CARE PHYSICIAN: Idelle Crouch, MD   REQUESTING/REFERRING PHYSICIAN: Karma Greaser, MD  CHIEF COMPLAINT:   Chief Complaint  Patient presents with  . Rectal Bleeding    HISTORY OF PRESENT ILLNESS:  Warren Ingram  is a 77 y.o. male who presents with 4 episodes of large bloody bowel movement.  Patient is about 6 days out from colonoscopy with removal of several large polyps.  He had been having no difficulty at home until today when he had a large bright red bloody bowel movement.  He had 2 more episodes at home and one here in the ED.  Hemoglobin currently stable, hospitalist called for admission  PAST MEDICAL HISTORY:   Past Medical History:  Diagnosis Date  . Arthritis   . Barrett esophagus   . Benign neoplasm of abdomen   . Benign neoplasm of colon   . Coronary artery disease   . GERD (gastroesophageal reflux disease)   . Hypercholesterolemia   . Hypothyroidism   . Stroke [facility])      PAST SURGICAL HISTORY:   Past Surgical History:  Procedure Laterality Date  . CARDIAC CATHETERIZATION    . ESOPHAGOGASTRODUODENOSCOPY (EGD) WITH PROPOFOL N/A 10/28/2015   Procedure: ESOPHAGOGASTRODUODENOSCOPY (EGD) WITH PROPOFOL;  Surgeon: Hulen Luster, MD;  Location: [facility] ENDOSCOPY;  Service: Gastroenterology;  Laterality: N/A;  . KNEE ARTHROSCOPY    . LEFT HEART CATH AND CORONARY ANGIOGRAPHY N/A 07/25/2017   Procedure: LEFT HEART CATH AND CORONARY ANGIOGRAPHY;  Surgeon: Teodoro Spray, MD;  Location: [place] CV LAB;  Service: Cardiovascular;  Laterality: N/A;  . VASECTOMY       SOCIAL HISTORY:   Social History   Tobacco Use  . Smoking status: Former Smoker    Packs/day: 1.00    Last attempt to quit: 1985    Years since quitting: 34.5  . Smokeless tobacco: Never Used  Substance Use Topics   . Alcohol use: Yes     FAMILY HISTORY:   Family History  Problem Relation Age of Onset  . Heart attack Mother   . Lung disease Father      DRUG ALLERGIES:  No Known Allergies  MEDICATIONS AT HOME:   Prior to Admission medications   Medication Sig Start Date End Date Taking? Authorizing Provider  acetaminophen (TYLENOL) 500 MG tablet Take 500 mg by mouth every 8 (eight) hours as needed for mild pain or headache.    [provider]  amiodarone (PACERONE) 200 MG tablet  09/03/17   [provider]  aspirin (ASPIRIN EC) 81 MG EC tablet Take 81 mg by mouth daily. Swallow whole.    [provider]  atorvastatin (LIPITOR) 80 MG tablet Take 80 mg by mouth daily.    [provider]  calcium carbonate (TUMS - DOSED IN MG ELEMENTAL CALCIUM) 500 MG chewable tablet Chew 2 tablets by mouth 2 (two) times daily as needed for indigestion or heartburn.    [provider]  colchicine (COLCRYS) 0.6 MG tablet  08/10/17   [provider]  ELIQUIS 5 MG TABS tablet  09/03/17   [provider]  fluticasone (FLONASE) 50 MCG/ACT nasal spray Place 2 sprays into both nostrils daily.    [provider]  furosemide (LASIX) 40 MG tablet  09/03/17   [provider]  gemfibrozil (  LOPID) 600 MG tablet Take 600 mg by mouth 2 (two) times daily before a meal.    [provider]  isosorbide mononitrate (IMDUR) 60 MG 24 hr tablet Take 60 mg by mouth 2 (two) times daily.    [provider]  levothyroxine (SYNTHROID, LEVOTHROID) 150 MCG tablet Take 150 mcg by mouth daily before breakfast.    [provider]  metoprolol tartrate (LOPRESSOR) 25 MG tablet  09/03/17   [provider]  Omega-3 Fatty Acids (FISH OIL) 1000 MG CAPS Take 2,000 mg by mouth daily.     [provider]  omeprazole (PRILOSEC) 20 MG capsule Take 20 mg by mouth daily.    [provider]  sildenafil (VIAGRA) 100 MG tablet Take by  mouth.    [provider]  tamsulosin (FLOMAX) 0.4 MG CAPS capsule  09/03/17   [provider]    REVIEW OF SYSTEMS:  Review of Systems  Constitutional: Negative for chills, fever, malaise/fatigue and weight loss.  HENT: Negative for ear pain, hearing loss and tinnitus.   Eyes: Negative for blurred vision, double vision, pain and redness.  Respiratory: Negative for cough, hemoptysis and shortness of breath.   Cardiovascular: Negative for chest pain, palpitations, orthopnea and leg swelling.  Gastrointestinal: Positive for blood in stool. Negative for abdominal pain, constipation, diarrhea, nausea and vomiting.  Genitourinary: Negative for dysuria, frequency and hematuria.  Musculoskeletal: Negative for back pain, joint pain and neck pain.  Skin:       No acne, rash, or lesions  Neurological: Negative for dizziness, tremors, focal weakness and weakness.  Endo/Heme/Allergies: Negative for polydipsia. Does not bruise/bleed easily.  Psychiatric/Behavioral: Negative for depression. The patient is not nervous/anxious and does not have insomnia.      VITAL SIGNS:   Vitals:   12/30/17 2036 12/30/17 2040 12/30/17 2100  BP: 126/67  (!) 152/87  Pulse: 77  68  Resp: 18  14  Temp: 98.1 F (36.7 C)    TempSrc: Oral    SpO2: 98%  98%  Weight:  81.6 kg (180 lb)   Height:  5\' 6"  (1.676 m)    Wt Readings from Last 3 Encounters:  12/30/17 81.6 kg (180 lb)  11/13/17 81.2 kg (179 lb)  09/13/17 79.7 kg (175 lb 12.8 oz)    PHYSICAL EXAMINATION:  Physical Exam  Vitals reviewed. Constitutional: He is oriented to person, place, and time. He appears well-developed and well-nourished. No distress.  HENT:  Head: Normocephalic and atraumatic.  Mouth/Throat: Oropharynx is clear and moist.  Eyes: Pupils are equal, round, and reactive to light. Conjunctivae and EOM are normal. No scleral icterus.  Neck: Normal range of motion. Neck supple. No JVD present. No thyromegaly present.   Cardiovascular: Normal rate, regular rhythm and intact distal pulses. Exam reveals no gallop and no friction rub.  No murmur heard. Respiratory: Effort normal and breath sounds normal. No respiratory distress. He has no wheezes. He has no rales.  GI: Soft. Bowel sounds are normal. He exhibits no distension. There is no tenderness.  Musculoskeletal: Normal range of motion. He exhibits no edema.  No arthritis, no gout  Lymphadenopathy:    He has no cervical adenopathy.  Neurological: He is alert and oriented to person, place, and time. No cranial nerve deficit.  No dysarthria, no aphasia  Skin: Skin is warm and dry. No rash noted. No erythema.  Psychiatric: He has a normal mood and affect. His behavior is normal. Judgment and thought content normal.  LABORATORY PANEL:   CBC Recent Labs  Lab 12/30/17 2104  WBC 7.6  HGB 13.4  HCT 39.4*  PLT 202   ------------------------------------------------------------------------------------------------------------------  Chemistries  Recent Labs  Lab 12/30/17 2104  NA 142  K 3.4*  CL 104  CO2 30  GLUCOSE 118*  BUN 18  CREATININE 1.08  CALCIUM 8.7*  AST 20  ALT 29  ALKPHOS 105  BILITOT 0.6   ------------------------------------------------------------------------------------------------------------------  Cardiac Enzymes No results for input(s): TROPONINI in the last 168 hours. ------------------------------------------------------------------------------------------------------------------  RADIOLOGY:  No results found.  EKG:  No orders found for this or any previous visit.  IMPRESSION AND PLAN:  Principal Problem:   Hematochezia -unclear etiology, possibly related to recent colonoscopy with polyp removal.  We will admit with close hemoglobin monitoring, GI consult Active Problems:   CAD in native artery -continue home meds   Benign essential HTN -continue home antihypertensives   GERD (gastroesophageal reflux  disease) -Home dose PPI   Pure hypercholesterolemia -Home dose antilipid   Hypothyroidism -home dose thyroid replacement  Chart review performed and case discussed with ED provider. Labs, imaging and/or ECG reviewed by provider and discussed with patient/family. Management plans discussed with the patient and/or family.  DVT PROPHYLAXIS: Mechanical only  GI PROPHYLAXIS: PPI  ADMISSION STATUS: Inpatient  CODE STATUS: Full Code Status History    Date Active Date Inactive Code Status Order ID Comments User Context   07/25/2017 0952 07/25/2017 1733 Full Code [id number]  Teodoro Spray, MD Inpatient      TOTAL TIME TAKING CARE OF THIS PATIENT: 45 minutes.   Sunaina Ferrando Wheeler 12/30/2017, 10:00 PM  [employer]  [phone]  CC: Primary care physician; Idelle Crouch, MD  Note:  This document was prepared using Dragon voice recognition software and may include unintentional dictation errors.

## 2017-12-30 NOTE — ED Notes (Signed)
Patient using the bedpan with large amount of maroon colored stool. Patient with complaint of becoming dizzy, nauseated and heart rate down to 39. Dr. Karma Greaser at bedside. New order for NS bolus given.

## 2017-12-30 NOTE — ED Provider Notes (Signed)
[facility] Emergency Department Provider Note  ____________________________________________   First MD Initiated Contact with Patient 12/30/17 2051     (approximate)  I have reviewed the triage vital signs and the nursing notes.   HISTORY  Chief Complaint Rectal Bleeding    HPI Warren Ingram is a 77 y.o. male with medical history as listed below which notably includes a colonoscopy with multiple (apparently at least 7)  Polyps removed.  The colonoscopy occurred 5 days ago at [facility].  He presents tonight by private vehicle for acute onset of severe bright red blood per rectum.  He has been asymptomatic since the colonoscopy.  He has been eating without any difficulty.  Tonight he was eating dinner when he had the sudden urge to have a bowel movement.  When he went for the bowel movement it was, in his words, pure blood.  That was at approximately 6:30 PM.  In the 2-1/2 hours or so since the first episode he has had 2 additional episodes of large-volume hematochezia and several smaller ones.  He started to feel lightheaded at the time of the last one which was in the emergency department while he was awaiting a room.  Currently, while sitting in bed, he says that he feels better.  He is still having a sense of needing to have a bowel movement but it is controllable.  He denies fever/chills, chest pain, shortness of breath, nausea, vomiting, and abdominal pain.  The onset of the symptoms was acute and severe and nothing in particular is making it better or worse.  He takes a daily baby aspirin but is on no other anticoagulation.    Past Medical History:  Diagnosis Date  . Arthritis   . Barrett esophagus   . Benign neoplasm of abdomen   . Benign neoplasm of colon   . Coronary artery disease   . GERD (gastroesophageal reflux disease)   . Hypercholesterolemia   . Hypothyroidism   . Stroke [facility])     Patient Active Problem List   Diagnosis Date Noted  .  Hematochezia 12/30/2017  . Hypothyroidism 12/30/2017  . 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 Benign neoplasm of colon 08/21/2013  . CAD in native artery 08/21/2013  . Diverticulitis of colon 08/21/2013  . GERD (gastroesophageal reflux disease) 08/21/2013  . Benign essential HTN 08/21/2013  . Anal bleeding 08/21/2013  . Arthritis, degenerative 08/21/2013  . 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 Pure hypercholesterolemia 08/21/2013  . Adenomatous colon polyp 08/21/2013  . Osteoarthrosis, unspecified whether generalized or localized, pelvic region and thigh 08/21/2013    Past Surgical History:  Procedure Laterality Date  . CARDIAC CATHETERIZATION    . ESOPHAGOGASTRODUODENOSCOPY (EGD) WITH PROPOFOL N/A 10/28/2015   Procedure: ESOPHAGOGASTRODUODENOSCOPY (EGD) WITH PROPOFOL;  Surgeon: Hulen Luster, MD;  Location: [facility] ENDOSCOPY;  Service: Gastroenterology;  Laterality: N/A;  . KNEE ARTHROSCOPY    . LEFT HEART CATH AND CORONARY ANGIOGRAPHY N/A 07/25/2017   Procedure: LEFT HEART CATH AND CORONARY ANGIOGRAPHY;  Surgeon: Teodoro Spray, MD;  Location: [place] CV LAB;  Service: Cardiovascular;  Laterality: N/A;  . VASECTOMY      Prior to Admission medications   Medication Sig Start Date End Date Taking? Authorizing Provider  aspirin (ASPIRIN EC) 81 MG EC tablet Take 81 mg by mouth daily. Swallow whole.   Yes [provider]  atorvastatin (LIPITOR) 80 MG tablet Take 80  mg by mouth at bedtime.    Yes [provider]  furosemide (LASIX) 40 MG tablet Take 40 mg by mouth daily. 08/11/17  Yes [provider]  levothyroxine (SYNTHROID, LEVOTHROID) 150 MCG tablet Take 150 mcg by mouth daily before breakfast.   Yes [provider]  metoprolol tartrate (LOPRESSOR) 25 MG tablet 25 mg 2 (two) times daily.  09/03/17  Yes [provider]  Omega-3 Fatty Acids (FISH OIL) 1000 MG CAPS Take  2,000 mg by mouth daily.    Yes [provider]  omeprazole (PRILOSEC) 20 MG capsule Take 20 mg by mouth daily.   Yes [provider]  tamsulosin (FLOMAX) 0.4 MG CAPS capsule Take 0.4 mg by mouth at bedtime.  09/03/17  Yes [provider]    Allergies Patient has no known allergies.  Family History  Problem Relation Age of Onset  . Heart attack Mother   . Lung disease Father     Social History Social History   Tobacco Use  . Smoking status: Former Smoker    Packs/day: 1.00    Last attempt to quit: 1985    Years since quitting: 34.5  . Smokeless tobacco: Never Used  Substance Use Topics  . Alcohol use: Yes    Alcohol/week: 0.6 oz    Types: 1 Cans of beer per week    Comment: 4 to 5 times per week  . Drug use: No    Review of Systems Constitutional: No fever/chills Eyes: No visual changes. ENT: No sore throat. Cardiovascular: Denies chest pain. Respiratory: Denies shortness of breath. Gastrointestinal: Acute onset severe bright red blood per rectum x3-6 episodes as described above.  No abdominal pain, nausea, nor vomiting. Genitourinary: Negative for dysuria. Musculoskeletal: Negative for neck pain.  Negative for back pain. Integumentary: Negative for rash. Neurological: Negative for headaches, focal weakness or numbness.   ____________________________________________   PHYSICAL EXAM:  VITAL SIGNS: ED Triage Vitals  Enc Vitals Group     BP 12/30/17 2036 126/67     Pulse Rate 12/30/17 2036 77     Resp 12/30/17 2036 18     Temp 12/30/17 2036 98.1 F (36.7 C)     Temp Source 12/30/17 2036 Oral     SpO2 12/30/17 2036 98 %     Weight 12/30/17 2040 81.6 kg (180 lb)     Height 12/30/17 2040 1.676 m (5\' 6" )     Head Circumference --      Peak Flow --      Pain Score 12/30/17 2037 0     Pain Loc --      Pain Edu? --      Excl. in [place]? --     Constitutional: Alert and oriented. Well appearing and in no acute distress. Eyes:  Conjunctivae are normal.  Head: Atraumatic. Nose: No congestion/rhinnorhea. Mouth/Throat: Mucous membranes are moist. Neck: No stridor.  No meningeal signs.   Cardiovascular: Normal rate, regular rhythm. Good peripheral circulation. Grossly normal heart sounds. Respiratory: Normal respiratory effort.  No retractions. Lungs CTAB. Gastrointestinal: Soft and nontender. No distention.  GU: Hematochezia is easily visible on the outside of the patient's anus without need for digital exam. Musculoskeletal: No lower extremity tenderness nor edema. No gross deformities of extremities. Neurologic:  Normal speech and language. No gross focal neurologic deficits are appreciated.  Skin:  Skin is warm, dry and intact. No rash noted. Psychiatric: Mood and affect are normal. Speech and behavior are normal.  ____________________________________________   LABS (all  labs ordered are listed, but only abnormal results are displayed)  Labs Reviewed  COMPREHENSIVE METABOLIC PANEL - Abnormal; Notable for the following components:      Result Value   Potassium 3.4 (*)    Glucose, Bld 118 (*)    Calcium 8.7 (*)    All other components within normal limits  CBC WITH DIFFERENTIAL/PLATELET - Abnormal; Notable for the following components:   HCT 39.4 (*)    RDW 16.1 (*)    All other components within normal limits  LIPASE, BLOOD  PROTIME-INR  APTT  HEMOGLOBIN  HEMOGLOBIN  BASIC METABOLIC PANEL  CBC  HEMOGLOBIN  HEMOGLOBIN  HEMOGLOBIN  TYPE AND SCREEN  PREPARE RBC (CROSSMATCH)  ABO/RH   ____________________________________________  EKG  None - EKG not ordered by ED physician ____________________________________________  RADIOLOGY   ED MD interpretation: No indication for imaging  Official radiology report(s): No results found.  ____________________________________________   PROCEDURES  Critical Care performed: Yes, see critical care procedure note(s)   Procedure(s) performed:    .Critical Care Performed by: Hinda Kehr, MD Authorized by: Hinda Kehr, MD   Critical care provider statement:    Critical care time (minutes):  30   Critical care time was exclusive of:  Separately billable procedures and treating other patients   Critical care was necessary to treat or prevent imminent or life-threatening deterioration of the following conditions:  Circulatory failure (acute lower GI bleeding)   Critical care was time spent personally by me on the following activities:  Development of treatment plan with patient or surrogate, discussions with consultants, evaluation of patient's response to treatment, examination of patient, obtaining history from patient or surrogate, ordering and performing treatments and interventions, ordering and review of laboratory studies, ordering and review of radiographic studies, pulse oximetry, re-evaluation of patient's condition and review of old charts     ____________________________________________   INITIAL IMPRESSION / ASSESSMENT AND PLAN / ED COURSE  As part of my medical decision making, I reviewed the following data within the electronic MEDICAL RECORD NUMBER History obtained from family, Nursing notes reviewed and incorporated, Labs reviewed , Old chart reviewed, Discussed with admitting physician  and called and spoke by phone with Gastroenterology    Differential diagnosis includes, but is not limited to, post polypectomy bleeding, colon perforation from the colonoscopy, AV malformation, diverticulosis.  By far the post polypectomy bleeding is the most likely diagnosis.  He is currently hemodynamically stable and in no acute distress and has no pain or tenderness to palpation of his abdomen.    Given the concern for large volume and persistent blood loss, I am treating with tranexamic acid 1000 mg IV administered as an infusion over 10 minutes.  I am going to hold off on giving IV fluids unless he becomes hypotensive.  2  large-bore peripheral IVs will be placed, checking normal lab work including coagulation studies and type and screen.  No indication for imaging at this time.  I will contact gastroenterology by phone and I have explained to the patient he will need to be admitted.  I asked him if he would like me to contact [facility] for transfer but I explained that we have the resources to care for him here, and he strongly prefers to stay at this facility unless there is a contraindication to do so.   Clinical Course as of Dec 31 29  Sun Dec 30, 2017  2118 I spoke by phone with Dr. Marius Ditch with the gastroenterology service and we  discussed the case in detail.  She agrees with the management thus far and agrees the patient needs to be admitted.  She has taken down the patient's contact information.  After the labs are back I will call the hospitalist for admission.   [CF]  2135 Patient had vasovagal episode while having another bout of hematochezia.  Recovered within a minute or so.  Starting 1L NS IV bolus, will crossmatch 2 units PRBCs   [CF]    Clinical Course User Index [CF] Hinda Kehr, MD   Discussed the case with Dr. Jannifer Franklin with the hospitalist service who will admit.   ____________________________________________  FINAL CLINICAL IMPRESSION(S) / ED DIAGNOSES  Final diagnoses:  Hematochezia  Postoperative hemorrhage involving digestive system following digestive system procedure  Vasovagal episode     MEDICATIONS GIVEN DURING THIS VISIT:  Medications  0.9 %  sodium chloride infusion (10 mL/hr Intravenous Not Given 12/30/17 2327)  0.9 %  sodium chloride infusion ( Intravenous New Bag/Given 12/30/17 2327)  acetaminophen (TYLENOL) tablet 650 mg (has no administration in time range)    Or  acetaminophen (TYLENOL) suppository 650 mg (has no administration in time range)  ondansetron (ZOFRAN) tablet 4 mg (has no administration in time range)    Or  ondansetron (ZOFRAN) injection 4 mg (has  no administration in time range)  tranexamic acid (CYKLOKAPRON) 1,000 mg in sodium chloride 0.9 % 100 mL BOLUS (0 mg Intravenous Stopped 12/30/17 2123)  sodium chloride 0.9 % bolus 1,000 mL (0 mLs Intravenous Stopped 12/30/17 2235)     ED Discharge Orders    None       Note:  This document was prepared using Dragon voice recognition software and may include unintentional dictation errors.    Hinda Kehr, MD 12/31/17 [phone]

## 2017-12-30 NOTE — ED Triage Notes (Signed)
To ED for rectal bleeding with onset an hour ago. Had colonscopy on Tuesday and they removed some polyps. About one hour ago filled the toilet bowl with bright red blood. Has had six episodes in the last hour, three while waiting for triage.

## 2017-12-30 NOTE — ED Notes (Signed)
Admitting md at bedside

## 2017-12-31 DIAGNOSIS — K921 Melena: Secondary | ICD-10-CM

## 2017-12-31 LAB — BASIC METABOLIC PANEL
ANION GAP: 5 (ref 5–15)
BUN: 18 mg/dL (ref 8–23)
CHLORIDE: 111 mmol/L (ref 98–111)
CO2: 27 mmol/L (ref 22–32)
Calcium: 7.8 mg/dL — ABNORMAL LOW (ref 8.9–10.3)
Creatinine, Ser: 0.85 mg/dL (ref 0.61–1.24)
GFR calc non Af Amer: >60 mL/min (ref >60)
Glucose, Bld: 127 mg/dL — ABNORMAL HIGH (ref 70–99)
POTASSIUM: 3.6 mmol/L (ref 3.5–5.1)
SODIUM: 143 mmol/L (ref 135–145)

## 2017-12-31 LAB — CBC
HEMATOCRIT: 28.6 % — AB (ref 40.0–52.0)
HEMOGLOBIN: 9.9 g/dL — AB (ref 13.0–18.0)
MCH: 30.1 pg (ref 26.0–34.0)
MCHC: 34.5 g/dL (ref 32.0–36.0)
MCV: 87.5 fL (ref 80.0–100.0)
Platelets: 157 10*3/uL (ref 150–440)
RBC: 3.27 MIL/uL — AB (ref 4.40–5.90)
RDW: 16.4 % — ABNORMAL HIGH (ref 11.5–14.5)
WBC: 5.5 10*3/uL (ref 3.8–10.6)

## 2017-12-31 LAB — HEMOGLOBIN
Hemoglobin: 10.5 g/dL — ABNORMAL LOW (ref 13.0–18.0)
Hemoglobin: 9.2 g/dL — ABNORMAL LOW (ref 13.0–18.0)
Hemoglobin: 9.5 g/dL — ABNORMAL LOW (ref 13.0–18.0)
Hemoglobin: 10 g/dL — ABNORMAL LOW (ref 13.0–18.0)

## 2017-12-31 MED ORDER — PEG 3350-KCL-NA BICARB-NACL 420 G PO SOLR
4000.0000 mL | Freq: Once | ORAL | Status: AC
  Administered 2017-12-31: 4000 mL via ORAL
  Filled 2017-12-31: qty 4000

## 2017-12-31 MED ORDER — TAMSULOSIN HCL 0.4 MG PO CAPS
0.4000 mg | ORAL_CAPSULE | Freq: Every day | ORAL | Status: DC
  Filled 2017-12-31: qty 1

## 2017-12-31 MED ORDER — ATORVASTATIN CALCIUM 20 MG PO TABS
80.0000 mg | ORAL_TABLET | Freq: Every day | ORAL | Status: DC
  Administered 2017-12-31: 80 mg via ORAL
  Filled 2017-12-31: qty 4

## 2017-12-31 MED ORDER — LEVOTHYROXINE SODIUM 50 MCG PO TABS: 150.0000 ug | ORAL_TABLET | Freq: Every day | ORAL | Status: DC

## 2017-12-31 MED ORDER — SODIUM CHLORIDE 0.9 % IV SOLN
INTRAVENOUS | Status: DC
  Administered 2017-12-31 – 2018-01-01 (×2): via INTRAVENOUS

## 2017-12-31 NOTE — Progress Notes (Signed)
[facility] NAME: Warren Ingram    MR#:  [id number]  DATE OF BIRTH:  [birth year]  SUBJECTIVE:  CHIEF COMPLAINT: She denies any abdominal pain but noticing rectal bleed which is less severe than yesterday.  Denies any nausea or hematemesis  REVIEW OF SYSTEMS:  CONSTITUTIONAL: No fever, fatigue or weakness.  EYES: No blurred or double vision.  EARS, NOSE, AND THROAT: No tinnitus or ear pain.  RESPIRATORY: No cough, shortness of breath, wheezing or hemoptysis.  CARDIOVASCULAR: No chest pain, orthopnea, edema.  GASTROINTESTINAL: No nausea, vomiting, diarrhea or abdominal pain.  GENITOURINARY: No dysuria, hematuria.  ENDOCRINE: No polyuria, nocturia,  HEMATOLOGY: No anemia, easy bruising or bleeding SKIN: No rash or lesion. MUSCULOSKELETAL: No joint pain or arthritis.   NEUROLOGIC: No tingling, numbness, weakness.  PSYCHIATRY: No anxiety or depression.   DRUG ALLERGIES:  No Known Allergies  VITALS:  Blood pressure (!) 126/58, pulse 60, temperature 97.9 F (36.6 C), temperature source Oral, resp. rate 16, height 5\' 6"  (1.676 m), weight 81.6 kg (180 lb), SpO2 99 %.  PHYSICAL EXAMINATION:  GENERAL:  78 y.o.-year-old patient lying in the bed with no acute distress.  EYES: Pupils equal, round, reactive to light and accommodation. No scleral icterus. Extraocular muscles intact.  HEENT: Head atraumatic, normocephalic. Oropharynx and nasopharynx clear.  NECK:  Supple, no jugular venous distention. No thyroid enlargement, no tenderness.  LUNGS: Normal breath sounds bilaterally, no wheezing, rales,rhonchi or crepitation. No use of accessory muscles of respiration.  CARDIOVASCULAR: S1, S2 normal. No murmurs, rubs, or gallops.  ABDOMEN: Soft, nontender, nondistended. Bowel sounds present. No organomegaly or mass.  EXTREMITIES: No pedal edema, cyanosis, or clubbing.  NEUROLOGIC: Cranial nerves II through XII are intact. Muscle  strength 5/5 in all extremities. Sensation intact. Gait not checked.  PSYCHIATRIC: The patient is alert and oriented x 3.  SKIN: No obvious rash, lesion, or ulcer.    LABORATORY PANEL:   CBC Recent Labs  Lab 12/31/17 0505  12/31/17 1421  WBC 5.5  --   --   HGB 9.9*   < > 9.5*  HCT 28.6*  --   --   PLT 157  --   --    < > = values in this interval not displayed.   ------------------------------------------------------------------------------------------------------------------  Chemistries  Recent Labs  Lab 12/30/17 2104 12/31/17 0505  NA 142 143  K 3.4* 3.6  CL 104 111  CO2 30 27  GLUCOSE 118* 127*  BUN 18 18  CREATININE 1.08 0.85  CALCIUM 8.7* 7.8*  AST 20  --   ALT 29  --   ALKPHOS 105  --   BILITOT 0.6  --    ------------------------------------------------------------------------------------------------------------------  Cardiac Enzymes No results for input(s): TROPONINI in the last 168 hours. ------------------------------------------------------------------------------------------------------------------  RADIOLOGY:  No results found.  EKG:  No orders found for this or any previous visit.  ASSESSMENT AND PLAN:    Hematochezia -unclear etiology, possibly related to recent colonoscopy with polyp removal.   Seen by gastroenterology planning to do colonoscopy tomorrow.   Patient will be n.p.o. after midnight     CAD in native artery -hold aspirin and beta-blocker in view of hypotension.  Resume Lipitor     Benign essential HTN -holding metoprolol in view of soft blood pressure    GERD (gastroesophageal reflux disease) - PPI    Pure hypercholesterolemia -Lipitor    Hypothyroidism -continue Synthroid 150 mcg      All the  records are reviewed and case discussed with Care Management/Social Workerr. Management plans discussed with the patient, family and they are in agreement.  CODE STATUS: fc   TOTAL TIME TAKING CARE OF THIS PATIENT: 35  minutes.   POSSIBLE D/C IN 1-2 DAYS, DEPENDING ON CLINICAL CONDITION.  Note: This dictation was prepared with Dragon dictation along with smaller phrase technology. Any transcriptional errors that result from this process are unintentional.   Nicholes Mango M.D on 12/31/2017 at 3:47 PM  Between 7am to 6pm - Pager - [phone] After 6pm go to [email or web address] - password [email or web address] [facility]  Office  [phone]  CC: Primary care physician; Idelle Crouch, MD

## 2017-12-31 NOTE — Consult Note (Signed)
Jonathon Bellows , MD [address] [place], [place], [place], [address] [address] [place], [place], [place], [address] Phone: [phone]  Fax: [phone]  Consultation  Referring Provider: Dr Margaretmary Eddy Primary Care Physician:  Idelle Crouch, MD Primary Gastroenterologist:  [facility]         Reason for Consultation:     GI bleed  Date of Admission:  12/30/2017 Date of Consultation:  12/31/2017         HPI:   Warren Ingram is a 77 y.o. male presented yesterday to the emergency room in the morning bright red blood per rectum.  He had a colonoscopy recently at [facility] and had a few polyps taken out.  After passing bright red blood per rectum he felt dizzy and lightheaded and came to the emergency room.  Colonoscopy was performed 12/24/2017.  9 polyps ranging from 3 to 8 mm in the sigmoid colon descending and transverse colon, cecum were resected with a hot snare.  Multiple diverticula were found in the sigmoid and descending colon.  EGD also showed features suggestive of Barrett's esophagus and biopsies were taken.  Biopsies confirm Barrett's esophagus.  The polyps were adenomatous.  He states that day before yesterday after dinner he felt like he had an urge to use the restroom and when he did he found that the bolus filled with bright red blood was painless subsequently recurred in the night and the next day he came to the hospital, he further had rectal bleeding last night and when I sent went to see him this morning he was sitting on the comode  and continued to have rectal bleeding.  Denies any other symptoms.  Denies use of any blood thinners.  CBC Latest Ref Rng & Units 12/31/2017 12/31/2017 12/30/2017  WBC 3.8 - 10.6 K/uL 5.5 - 7.6  Hemoglobin 13.0 - 18.0 g/dL 9.9(L) 10.5(L) 13.4  Hematocrit 40.0 - 52.0 % 28.6(L) - 39.4(L)  Platelets 150 - 440 K/uL 157 - 202     Past Medical History:  Diagnosis Date  . Arthritis   . Barrett esophagus   . Benign neoplasm of abdomen   . Benign  neoplasm of colon   . Coronary artery disease   . GERD (gastroesophageal reflux disease)   . Hypercholesterolemia   . Hypothyroidism   . Stroke [facility])     Past Surgical History:  Procedure Laterality Date  . CARDIAC CATHETERIZATION    . ESOPHAGOGASTRODUODENOSCOPY (EGD) WITH PROPOFOL N/A 10/28/2015   Procedure: ESOPHAGOGASTRODUODENOSCOPY (EGD) WITH PROPOFOL;  Surgeon: Hulen Luster, MD;  Location: [facility] ENDOSCOPY;  Service: Gastroenterology;  Laterality: N/A;  . KNEE ARTHROSCOPY    . LEFT HEART CATH AND CORONARY ANGIOGRAPHY N/A 07/25/2017   Procedure: LEFT HEART CATH AND CORONARY ANGIOGRAPHY;  Surgeon: Teodoro Spray, MD;  Location: [facility] CV LAB;  Service: Cardiovascular;  Laterality: N/A;  . VASECTOMY      Prior to Admission medications   Medication Sig Start Date End Date Taking? Authorizing Provider  aspirin (ASPIRIN EC) 81 MG EC tablet Take 81 mg by mouth daily. Swallow whole.   Yes [provider]  atorvastatin (LIPITOR) 80 MG tablet Take 80 mg by mouth at bedtime.    Yes [provider]  furosemide (LASIX) 40 MG tablet Take 40 mg by mouth daily. 08/11/17  Yes [provider]  levothyroxine (SYNTHROID, LEVOTHROID) 150 MCG tablet Take 150 mcg by mouth daily before breakfast.   Yes [provider]  metoprolol tartrate (LOPRESSOR)  25 MG tablet 25 mg 2 (two) times daily.  09/03/17  Yes [provider]  Omega-3 Fatty Acids (FISH OIL) 1000 MG CAPS Take 2,000 mg by mouth daily.    Yes [provider]  omeprazole (PRILOSEC) 20 MG capsule Take 20 mg by mouth daily.   Yes [provider]  tamsulosin (FLOMAX) 0.4 MG CAPS capsule Take 0.4 mg by mouth at bedtime.  09/03/17  Yes [provider]    Family History  Problem Relation Age of Onset  . Heart attack Mother   . Lung disease Father      Social History   Tobacco Use  . Smoking status: Former Smoker    Packs/day: 1.00    Last attempt to quit: 1985    Years since  quitting: 34.5  . Smokeless tobacco: Never Used  Substance Use Topics  . Alcohol use: Yes    Alcohol/week: 0.6 oz    Types: 1 Cans of beer per week    Comment: 4 to 5 times per week  . Drug use: No    Allergies as of 12/30/2017  . (No Known Allergies)    Review of Systems:    All systems reviewed and negative except where noted in HPI.   Physical Exam:  Vital signs in last 24 hours: Temp:  [97.8 F (36.6 C)-98.1 F (36.7 C)] 97.8 F (36.6 C) (07/29 0439) Pulse Rate:  [51-77] 58 (07/29 0439) Resp:  [14-21] 16 (07/29 0439) BP: (99-152)/(51-87) 109/56 (07/29 0439) SpO2:  [94 %-100 %] 99 % (07/29 0439) Weight:  [180 lb (81.6 kg)] 180 lb (81.6 kg) (07/28 2040) Last BM Date: 12/30/17 General:   Pleasant, cooperative in NAD Head:  Normocephalic and atraumatic. Eyes:   No icterus.   Conjunctiva pink. PERRLA. Ears:  Normal auditory acuity. Neck:  Supple; no masses or thyroidomegaly Lungs: Respirations even and unlabored. Lungs clear to auscultation bilaterally.   No wheezes, crackles, or rhonchi.  Heart:  Regular rate and rhythm;  Without murmur, clicks, rubs or gallops Abdomen:  Soft, nondistended, nontender. Normal bowel sounds. No appreciable masses or hepatomegaly.  No rebound or guarding.  Neurologic:  Alert and oriented x3;  grossly normal neurologically. Skin:  Intact without significant lesions or rashes. Cervical Nodes:  No significant cervical adenopathy. Psych:  Alert and cooperative. Normal affect.  LAB RESULTS: Recent Labs    12/30/17 2104 12/31/17 0130 12/31/17 0505  WBC 7.6  --  5.5  HGB 13.4 10.5* 9.9*  HCT 39.4*  --  28.6*  PLT 202  --  157   BMET Recent Labs    12/30/17 2104 12/31/17 0505  NA 142 143  K 3.4* 3.6  CL 104 111  CO2 30 27  GLUCOSE 118* 127*  BUN 18 18  CREATININE 1.08 0.85  CALCIUM 8.7* 7.8*   LFT Recent Labs    12/30/17 2104  PROT 7.0  ALBUMIN 3.8  AST 20  ALT 29  ALKPHOS 105  BILITOT 0.6   PT/INR Recent Labs     12/30/17 2104  LABPROT 12.8  INR 0.97    STUDIES: No results found.    Impression / Plan:   Warren Ingram is a 77 y.o. y/o male admitted with what appears to be a post polypectomy bleed.  9 polyps were resected on a colonoscopy few days back at [facility].  Hot and cold snares were used.  Plan 1.  Monitor CBC closely if there is a further drop in hemoglobin and there is  concern for ongoing bleed obtain tagged RBC scan and transfuse as needed.  If there is evidence of ongoing bleeding on the bleeding scan scan before his prep is completed then he would need vascular intervention .  2.  Commence bowel prep with the aim to perform colonoscopy tomorrow.  If the bleeding stops we will hold off but if it continues plan to intervene.  I have discussed alternative options, risks & benefits,  which include, but are not limited to, bleeding, infection, perforation,respiratory complication & drug reaction.  The patient agrees with this plan & written consent will be obtained.      Thank you for involving me in the care of this patient.      LOS: 1 day   Jonathon Bellows, MD  12/31/2017, 7:27 AM

## 2017-12-31 NOTE — Progress Notes (Signed)
Pt's stool is still very bloody with clots.

## 2018-01-01 ENCOUNTER — Inpatient Hospital Stay: Payer: Medicare PPO | Admitting: Anesthesiology

## 2018-01-01 ENCOUNTER — Encounter: Payer: Self-pay | Admitting: Anesthesiology

## 2018-01-01 ENCOUNTER — Encounter
Admission: EM | Disposition: A | Discharge status: Home / Self Care | Payer: Self-pay | Source: Home / Self Care | Attending: Internal Medicine

## 2018-01-01 DIAGNOSIS — D120 Benign neoplasm of cecum: Secondary | ICD-10-CM

## 2018-01-01 DIAGNOSIS — D122 Benign neoplasm of ascending colon: Secondary | ICD-10-CM

## 2018-01-01 HISTORY — PX: COLONOSCOPY WITH PROPOFOL: SHX5780

## 2018-01-01 LAB — TYPE AND SCREEN
ABO/RH(D): A POS
ANTIBODY SCREEN: NEGATIVE
UNIT DIVISION: 0
UNIT DIVISION: 0

## 2018-01-01 LAB — CBC
HEMATOCRIT: 23.6 % — AB (ref 40.0–52.0)
Hemoglobin: 8.2 g/dL — ABNORMAL LOW (ref 13.0–18.0)
MCH: 30.6 pg (ref 26.0–34.0)
MCHC: 34.7 g/dL (ref 32.0–36.0)
MCV: 88.2 fL (ref 80.0–100.0)
Platelets: 140 10*3/uL — ABNORMAL LOW (ref 150–440)
RBC: 2.67 MIL/uL — AB (ref 4.40–5.90)
RDW: 16.3 % — ABNORMAL HIGH (ref 11.5–14.5)
WBC: 5.9 10*3/uL (ref 3.8–10.6)

## 2018-01-01 LAB — BPAM RBC
BLOOD PRODUCT EXPIRATION DATE: 201907312359
Blood Product Expiration Date: 201908052359
UNIT TYPE AND RH: 600
UNIT TYPE AND RH: 9500

## 2018-01-01 LAB — PREPARE RBC (CROSSMATCH)

## 2018-01-01 SURGERY — COLONOSCOPY WITH PROPOFOL
Anesthesia: General

## 2018-01-01 MED ORDER — LIDOCAINE 2% (20 MG/ML) 5 ML SYRINGE
INTRAMUSCULAR | Status: DC | PRN
  Administered 2018-01-01: 30 mg via INTRAVENOUS

## 2018-01-01 MED ORDER — ACETAMINOPHEN 325 MG PO TABS: 650.0000 mg | ORAL_TABLET | Freq: Four times a day (QID) | ORAL | Status: DC | PRN

## 2018-01-01 MED ORDER — EPHEDRINE SULFATE 50 MG/ML IJ SOLN
INTRAMUSCULAR | Status: DC | PRN
  Administered 2018-01-01: 10 mg via INTRAVENOUS
  Administered 2018-01-01: 5 mg via INTRAVENOUS

## 2018-01-01 MED ORDER — PROPOFOL 500 MG/50ML IV EMUL
INTRAVENOUS | Status: AC
  Filled 2018-01-01: qty 50

## 2018-01-01 MED ORDER — FENTANYL CITRATE (PF) 100 MCG/2ML IJ SOLN
INTRAMUSCULAR | Status: DC | PRN
  Administered 2018-01-01 (×2): 50 ug via INTRAVENOUS

## 2018-01-01 MED ORDER — PROPOFOL 500 MG/50ML IV EMUL
INTRAVENOUS | Status: DC | PRN
  Administered 2018-01-01: 160 ug/kg/min via INTRAVENOUS

## 2018-01-01 MED ORDER — FENTANYL CITRATE (PF) 100 MCG/2ML IJ SOLN
INTRAMUSCULAR | Status: AC
  Filled 2018-01-01: qty 2

## 2018-01-01 MED ORDER — MIDAZOLAM HCL 2 MG/2ML IJ SOLN
INTRAMUSCULAR | Status: AC
Start: 2018-01-01 — End: ?
  Filled 2018-01-01: qty 2

## 2018-01-01 MED ORDER — PHENYLEPHRINE HCL 10 MG/ML IJ SOLN
INTRAMUSCULAR | Status: DC | PRN
  Administered 2018-01-01 (×3): 100 ug via INTRAVENOUS

## 2018-01-01 MED ORDER — MIDAZOLAM HCL 5 MG/5ML IJ SOLN
INTRAMUSCULAR | Status: DC | PRN
  Administered 2018-01-01: 1 mg via INTRAVENOUS

## 2018-01-01 MED ORDER — DOCUSATE SODIUM 100 MG PO CAPS: 100.0000 mg | ORAL_CAPSULE | Freq: Two times a day (BID) | ORAL | 0 refills | Status: DC

## 2018-01-01 MED ORDER — FERROUS SULFATE 325 (65 FE) MG PO TBEC: 325.0000 mg | DELAYED_RELEASE_TABLET | Freq: Two times a day (BID) | ORAL | 3 refills | Status: DC

## 2018-01-01 MED ORDER — PROPOFOL 10 MG/ML IV BOLUS
INTRAVENOUS | Status: DC | PRN
  Administered 2018-01-01: 100 mg via INTRAVENOUS

## 2018-01-01 NOTE — H&P (Signed)
Warren Bellows, MD [address] [place], [place], [place], [address] [address], [place], [place], [place], [address] Phone: [phone]  Fax: [phone]  Primary Care Physician:  Idelle Crouch, MD   Pre-Procedure History & Physical: HPI:  Warren Ingram is a 77 y.o. male is here for an colonoscopy.   Past Medical History:  Diagnosis Date  . Arthritis   . Barrett esophagus   . Benign neoplasm of abdomen   . Benign neoplasm of colon   . Coronary artery disease   . GERD (gastroesophageal reflux disease)   . Hypercholesterolemia   . Hypothyroidism   . Stroke [facility])     Past Surgical History:  Procedure Laterality Date  . CARDIAC CATHETERIZATION    . ESOPHAGOGASTRODUODENOSCOPY (EGD) WITH PROPOFOL N/A 10/28/2015   Procedure: ESOPHAGOGASTRODUODENOSCOPY (EGD) WITH PROPOFOL;  Surgeon: Hulen Luster, MD;  Location: [facility] ENDOSCOPY;  Service: Gastroenterology;  Laterality: N/A;  . KNEE ARTHROSCOPY    . LEFT HEART CATH AND CORONARY ANGIOGRAPHY N/A 07/25/2017   Procedure: LEFT HEART CATH AND CORONARY ANGIOGRAPHY;  Surgeon: Teodoro Spray, MD;  Location: [facility] CV LAB;  Service: Cardiovascular;  Laterality: N/A;  . VASECTOMY      Prior to Admission medications   Medication Sig Start Date End Date Taking? Authorizing Provider  aspirin (ASPIRIN EC) 81 MG EC tablet Take 81 mg by mouth daily. Swallow whole.   Yes [provider]  atorvastatin (LIPITOR) 80 MG tablet Take 80 mg by mouth at bedtime.    Yes [provider]  furosemide (LASIX) 40 MG tablet Take 40 mg by mouth daily. 08/11/17  Yes [provider]  levothyroxine (SYNTHROID, LEVOTHROID) 150 MCG tablet Take 150 mcg by mouth daily before breakfast.   Yes [provider]  metoprolol tartrate (LOPRESSOR) 25 MG tablet 25 mg 2 (two) times daily.  09/03/17  Yes [provider]  Omega-3 Fatty Acids (FISH OIL) 1000 MG CAPS Take 2,000 mg by mouth daily.    Yes  [provider]  omeprazole (PRILOSEC) 20 MG capsule Take 20 mg by mouth daily.   Yes [provider]  tamsulosin (FLOMAX) 0.4 MG CAPS capsule Take 0.4 mg by mouth at bedtime.  09/03/17  Yes [provider]    Allergies as of 12/30/2017  . (No Known Allergies)    Family History  Problem Relation Age of Onset  . Heart attack Mother   . Lung disease Father     Social History   Socioeconomic History  . Marital status: Married    Spouse name: Not on file  . Number of children: Not on file  . Years of education: Not on file  . Highest education level: Not on file  Occupational History  . Not on file  Social Needs  . Financial resource strain: Not on file  . Food insecurity:    Worry: Not on file    Inability: Not on file  . Transportation needs:    Medical: Not on file    Non-medical: Not on file  Tobacco Use  . Smoking status: Former Smoker    Packs/day: 1.00    Last attempt to quit: 1985    Years since quitting: 34.5  . Smokeless tobacco: Never Used  Substance and Sexual Activity  . Alcohol use: Yes    Alcohol/week: 0.6 oz    Types: 1 Cans of beer per week    Comment: 4 to  5 times per week  . Drug use: No  . Sexual activity: Not on file  Lifestyle  . Physical activity:    Days per week: Not on file    Minutes per session: Not on file  . Stress: Not on file  Relationships  . Social connections:    Talks on phone: Not on file    Gets together: Not on file    Attends religious service: Not on file    Active member of club or organization: Not on file    Attends meetings of clubs or organizations: Not on file    Relationship status: Not on file  . Intimate partner violence:    Fear of current or ex partner: Not on file    Emotionally abused: Not on file    Physically abused: Not on file    Forced sexual activity: Not on file  Other Topics Concern  . Not on file  Social History Narrative  . Not on file    Review of Systems: See  HPI, otherwise negative ROS  Physical Exam: BP 126/90 (BP Location: Left Arm)   Pulse 61   Temp 98.6 F (37 C) (Oral)   Resp 20   Ht 5\' 6"  (1.676 m)   Wt 180 lb (81.6 kg)   SpO2 96%   BMI 29.05 kg/m  General:   Alert,  pleasant and cooperative in NAD Head:  Normocephalic and atraumatic. Neck:  Supple; no masses or thyromegaly. Lungs:  Clear throughout to auscultation, normal respiratory effort.    Heart:  +S1, +S2, Regular rate and rhythm, No edema. Abdomen:  Soft, nontender and nondistended. Normal bowel sounds, without guarding, and without rebound.   Neurologic:  Alert and  oriented x4;  grossly normal neurologically.  Impression/Plan: Warren Ingram is here for an colonoscopy to be performed for post polypectomy bleeding .   Risks, benefits, limitations, and alternatives regarding  colonoscopy have been reviewed with the patient.  Questions have been answered.  All parties agreeable.   Warren Bellows, MD  01/01/2018, 12:36 PM

## 2018-01-01 NOTE — Progress Notes (Signed)
IV was removed. Discharge instructions were provided to the pt and wife. All questions were answered. The pt refused a wheelchair and walked downstairs with his wife.

## 2018-01-01 NOTE — Anesthesia Post-op Follow-up Note (Signed)
Anesthesia QCDR form completed.        

## 2018-01-01 NOTE — Discharge Instructions (Signed)
Up with primary care physician in 3 days Follow-up with gastroenterology in 1 to 2 weeks   Gastrointestinal Bleeding Gastrointestinal (GI) bleeding is bleeding somewhere along the digestive tract, between the mouth and anus. This can be caused by various problems. The severity of these problems can range from mild to serious or even life-threatening. If you have GI bleeding, you may find blood in your stools (feces), you may have black stools, or you may vomit blood. If there is a lot of bleeding, you may need to stay in the hospital. What are the causes? This condition may be caused by:  Esophagitis. This is inflammation, irritation, or swelling of the esophagus.  Hemorrhoids.These are swollen veins in the rectum.  Anal fissures.These are areas of painful tearing that are often caused by passing hard stool.  Diverticulosis.These are pouches that form on the colon over time, with age, and may bleed a lot.  Diverticulitis.This is inflammation in areas with diverticulosis. It can cause pain, fever, and bloody stools, although bleeding may be mild.  Polyps and cancer. Colon cancer often starts out as precancerous polyps.  Gastritis and ulcers. With these, bleeding may come from the upper GI tract, near the stomach.  What are the signs or symptoms? Symptoms of this condition may include:  Bright red blood in your vomit, or vomit that looks like coffee grounds.  Bloody, black, or tarry stools. ? Bleeding from the lower GI tract will usually cause red or maroon blood in the stools. ? Bleeding from the upper GI tract may cause black, tarry, often bad-smelling stools. ? In certain cases, if the bleeding is fast enough, the stools may be red.  Pain or cramping in the abdomen.  How is this diagnosed? This condition may be diagnosed based on:  Medical history and physical exam.  Various tests, such as: ? Blood tests. ? X-rays and other imaging tests. ? Esophagogastroduodenoscopy  (EGD). In this test, a flexible, lighted tube is used to look at your esophagus, stomach, and small intestine. ? Colonoscopy. In this test, a flexible, lighted tube is used to look at your colon.  How is this treated? Treatment for this condition depends on the cause of the bleeding. For example:  For bleeding from the esophagus, stomach, small intestine, or colon, the health care provider doing your EGD or colonoscopy may be able to stop the bleeding as part of the procedure.  Inflammation or infection of the colon can be treated with medicines.  Certain rectal problems can be treated with creams, suppositories, or warm baths.  Surgery is sometimes needed.  Blood transfusions are sometimes needed if a lot of blood has been lost.  If bleeding is slow, you may be allowed to go home. If there is a lot of bleeding, you will need to stay in the hospital for observation. Follow these instructions at home:  Take over-the-counter and prescription medicines only as told by your health care provider.  Eat foods that are high in fiber. This will help to keep your stools soft. These foods include whole grains, legumes, fruits, and vegetables. Eating 1-3 prunes each day works well for many people.  Drink enough fluid to keep your urine clear or pale yellow.  Keep all follow-up visits as told by your health care provider. This is important. Contact a health care provider if:  Your symptoms do not improve. Get help right away if:  Your bleeding increases.  You feel light-headed or you faint.  You feel weak.  You have severe cramps in your back or abdomen.  You pass large blood clots in your stool.  Your symptoms are getting worse. This information is not intended to replace advice given to you by your health care provider. Make sure you discuss any questions you have with your health care provider. Document Released: 05/19/2000 Document Revised: 10/20/2015 Document Reviewed:  11/09/2014 Elsevier Interactive Patient Education  2018 [employer].

## 2018-01-01 NOTE — Op Note (Signed)
[facility] Gastroenterology Patient Name: Warren Ingram Procedure Date: 01/01/2018 12:45 PM MRN: [id number] Account #: [id number] Date of Birth: 1941-05-29 Admit Type: Inpatient Age: 77 Room: [facility] ENDO ROOM 1 Gender: Male Note Status: Finalized Procedure:            Colonoscopy Indications:          Rectal bleeding Providers:            Jonathon Bellows MD, MD Medicines:            Monitored Anesthesia Care Complications:        No immediate complications. Procedure:            Pre-Anesthesia Assessment:                       - Prior to the procedure, a History and Physical was                        performed, and patient medications, allergies and                        sensitivities were reviewed. The patient's tolerance of                        previous anesthesia was reviewed.                       - The risks and benefits of the procedure and the                        sedation options and risks were discussed with the                        patient. All questions were answered and informed                        consent was obtained.                       - The risks and benefits of the procedure and the                        sedation options and risks were discussed with the                        patient. All questions were answered and informed                        consent was obtained.                       - ASA Grade Assessment: II - A patient with mild                        systemic disease.                       After obtaining informed consent, the colonoscope was                        passed under direct vision. Throughout the procedure,  the patient's blood pressure, pulse, and oxygen                        saturations were monitored continuously. The                        Colonoscope was introduced through the anus and                        advanced to the the cecum, identified by the   appendiceal orifice, IC valve and transillumination.                        The colonoscopy was performed with ease. The patient                        tolerated the procedure well. The quality of the bowel                        preparation was good. Findings:      The perianal and digital rectal examinations were normal.      Five sessile polyps were found in the ascending colon and cecum. The       polyps were 4 to 6 mm in size. These polyps were removed with a cold       snare. Resection and retrieval were complete.      Multiple small-mouthed diverticula were found in the sigmoid colon.      The exam was otherwise without abnormality on direct and retroflexion       views. Impression:           - Five 4 to 6 mm polyps in the ascending colon and in                        the cecum, removed with a cold snare. Resected and                        retrieved.                       - Diverticulosis in the sigmoid colon.                       - The examination was otherwise normal on direct and                        retroflexion views. Recommendation:       - Discharge patient to home (with escort).                       - Resume previous diet.                       - Continue present medications.                       - Await pathology results.                       - Repeat colonoscopy in 3 years for surveillance.                       -  Likely a diverticular bleed                       Home today after he eats and drinks Procedure Code(s):    --- Professional ---                       [phone], Colonoscopy, flexible; with removal of tumor(s),                        polyp(s), or other lesion(s) by snare technique Diagnosis Code(s):    --- Professional ---                       D12.2, Benign neoplasm of ascending colon                       D12.0, Benign neoplasm of cecum                       K62.5, Hemorrhage of anus and rectum                       K57.30, Diverticulosis of large  intestine without                        perforation or abscess without bleeding CPT copyright 2017 [employer]. All rights reserved. The codes documented in this report are preliminary and upon coder review may  be revised to meet current compliance requirements. Jonathon Bellows, MD Jonathon Bellows MD, MD 01/01/2018 1:16:38 PM This report has been signed electronically. Number of Addenda: 0 Note Initiated On: 01/01/2018 12:45 PM Scope Withdrawal Time: 0 hours 9 minutes 12 seconds  Total Procedure Duration: 0 hours 24 minutes 26 seconds       [facility]

## 2018-01-01 NOTE — Progress Notes (Signed)
Patient refuses SCDs; educated on purpose; voiced understanding; Barbaraann Faster, RN 5:16 AM 01/01/2018

## 2018-01-01 NOTE — Transfer of Care (Signed)
Immediate Anesthesia Transfer of Care Note  Patient: Warren Ingram  Procedure(s) Performed: COLONOSCOPY WITH PROPOFOL (N/A )  Patient Location: PACU and Endoscopy Unit  Anesthesia Type:General  Level of Consciousness: sedated  Airway & Oxygen Therapy: Patient Spontanous Breathing and Patient connected to nasal cannula oxygen  Post-op Assessment: Report given to RN and Post -op Vital signs reviewed and stable  Post vital signs: Reviewed and stable  Last Vitals:  Vitals Value Taken Time  BP 74/45 01/01/2018  1:22 PM  Temp    Pulse 78 01/01/2018  1:25 PM  Resp 10 01/01/2018  1:25 PM  SpO2 98 % 01/01/2018  1:25 PM  Vitals shown include unvalidated device data.  Last Pain:  Vitals:   01/01/18 1320  TempSrc: (P) Tympanic  PainSc:          Complications: No apparent anesthesia complications

## 2018-01-01 NOTE — Anesthesia Postprocedure Evaluation (Signed)
Anesthesia Post Note  Patient: Warren Ingram  Procedure(s) Performed: COLONOSCOPY WITH PROPOFOL (N/A )  Patient location during evaluation: PACU Anesthesia Type: General Level of consciousness: awake and alert and oriented Pain management: pain level controlled Vital Signs Assessment: post-procedure vital signs reviewed and stable Respiratory status: spontaneous breathing Cardiovascular status: blood pressure returned to baseline Anesthetic complications: no     Last Vitals:  Vitals:   01/01/18 1350 01/01/18 1403  BP: 114/68 (!) 117/59  Pulse:  67  Resp:  18  Temp:  (!) 36.3 C  SpO2:  98%    Last Pain:  Vitals:   01/01/18 1403  TempSrc: Oral  PainSc:                  Keonna Raether

## 2018-01-01 NOTE — Anesthesia Preprocedure Evaluation (Signed)
Anesthesia Evaluation  Patient identified by MRN, date of birth, ID band Patient awake    Reviewed: Allergy & Precautions, H&P , NPO status , Patient's Chart, lab work & pertinent test results  History of Anesthesia Complications Negative for: history of anesthetic complications  Airway Mallampati: III  TM Distance: >3 FB Neck ROM: limited    Dental  (+) Poor Dentition, Chipped, Missing, Partial Upper   Pulmonary neg shortness of breath, former smoker,    Pulmonary exam normal breath sounds clear to auscultation       Cardiovascular Exercise Tolerance: Good hypertension, (-) angina+ CAD and + Cardiac Stents  (-) DOE Normal cardiovascular exam Rhythm:regular Rate:Normal     Neuro/Psych  Headaches, CVA, Residual Symptoms negative psych ROS   GI/Hepatic Neg liver ROS, GERD  Controlled and Medicated,  Endo/Other  Hypothyroidism   Renal/GU negative Renal ROS  negative genitourinary   Musculoskeletal  (+) Arthritis , Osteoarthritis,    Abdominal   Peds negative pediatric ROS (+)  Hematology  (+) anemia ,   Anesthesia Other Findings Past Medical History:   Barrett esophagus                                            Benign neoplasm of abdomen                                   Benign neoplasm of colon                                     GERD (gastroesophageal reflux disease)                       Arthritis                                                    Hypercholesterolemia                                         Hypothyroidism                                              Past Surgical History:   KNEE ARTHROSCOPY                                              VASECTOMY                                                       Reproductive/Obstetrics negative OB ROS  Anesthesia Physical  Anesthesia Plan  ASA: III  Anesthesia Plan: General   Post-op Pain  Management:    Induction: Intravenous  PONV Risk Score and Plan:   Airway Management Planned: Nasal Cannula  Additional Equipment:   Intra-op Plan:   Post-operative Plan:   Informed Consent: I have reviewed the patients History and Physical, chart, labs and discussed the procedure including the risks, benefits and alternatives for the proposed anesthesia with the patient or authorized representative who has indicated his/her understanding and acceptance.   Dental Advisory Given  Plan Discussed with: Anesthesiologist, CRNA and Surgeon  Anesthesia Plan Comments:         Anesthesia Quick Evaluation

## 2018-01-01 NOTE — Discharge Summary (Signed)
[facility] [place] NAME: Warren Ingram    MR#:  [id number]  DATE OF BIRTH:  04-10-1941  DATE OF ADMISSION:  12/30/2017 ADMITTING PHYSICIAN: Lance Coon, MD  DATE OF DISCHARGE:  01/01/18 PRIMARY CARE PHYSICIAN: Idelle Crouch, MD    ADMISSION DIAGNOSIS:  Hematochezia [K92.1] Vasovagal episode [R55] Postoperative hemorrhage involving digestive system following digestive system procedure [K91.840]  DISCHARGE DIAGNOSIS:  Principal Problem:   Hematochezia Active Problems:   CAD in native artery   GERD (gastroesophageal reflux disease)   Benign essential HTN   Pure hypercholesterolemia   Hypothyroidism   SECONDARY DIAGNOSIS:   Past Medical History:  Diagnosis Date  . Arthritis   . Barrett esophagus   . Benign neoplasm of abdomen   . Benign neoplasm of colon   . Coronary artery disease   . GERD (gastroesophageal reflux disease)   . Hypercholesterolemia   . Hypothyroidism   . Stroke [facility])     HOSPITAL COURSE:   HPI  Warren Ingram  is a 77 y.o. male who presents with 4 episodes of large bloody bowel movement.  Patient is about 6 days out from colonoscopy with removal of several large polyps.  He had been having no difficulty at home until today when he had a large bright red bloody bowel movement.  He had 2 more episodes at home and one here in the ED.  Hemoglobin currently stable, hospitalist called for admission  Hematochezia -likely diverticular bleed Seen by gastroenterology planning to do colonoscopy tODAY Five 4 to 6 mm polyps in the ascending colon and in the cecum, removed with a cold snare. Resected and retrieved. - Diverticulosis in the sigmoid colon. - The examination was otherwise normal on direct and retroflexion views. Okay to discharge patient from GI standpoint.  Patient tolerated diet after the procedure.  Wants to go home- Discharge patient to home (with escort). - Continue present  medications. - Await pathology results. - Repeat colonoscopy in 3 years for surveillance. -    CAD in native artery -hold aspirin and beta-blocker in view of hypotension.  Resume Lipitor   Benign essential HTN -holding metoprolol in view of soft blood pressure  GERD (gastroesophageal reflux disease) - PPI  Pure hypercholesterolemia -Lipitor  Hypothyroidism -continue Synthroid 150 mcg     DISCHARGE CONDITIONS:   Stable  CONSULTS OBTAINED:  Treatment Team:  Lin Landsman, MD Jonathon Bellows, MD   PROCEDURES colonoscopy  DRUG ALLERGIES:  No Known Allergies  DISCHARGE MEDICATIONS:   Allergies as of 01/01/2018   No Known Allergies     Medication List    TAKE these medications   acetaminophen 325 MG tablet Commonly known as:  TYLENOL Take 2 tablets (650 mg total) by mouth every 6 (six) hours as needed for mild pain (or Fever >/= 101).   aspirin EC 81 MG EC tablet Generic drug:  aspirin Take 81 mg by mouth daily. Swallow whole.   atorvastatin 80 MG tablet Commonly known as:  LIPITOR Take 80 mg by mouth at bedtime.   docusate sodium 100 MG capsule Commonly known as:  COLACE Take 1 capsule (100 mg total) by mouth 2 (two) times daily.   ferrous sulfate 325 (65 FE) MG EC tablet Take 1 tablet (325 mg total) by mouth 2 (two) times daily.   Fish Oil 1000 MG Caps Take 2,000 mg by mouth daily.   furosemide 40 MG tablet Commonly known as:  LASIX Take 40 mg by  mouth daily.   levothyroxine 150 MCG tablet Commonly known as:  SYNTHROID, LEVOTHROID Take 150 mcg by mouth daily before breakfast.   metoprolol tartrate 25 MG tablet Commonly known as:  LOPRESSOR 25 mg 2 (two) times daily.   omeprazole 20 MG capsule Commonly known as:  PRILOSEC Take 20 mg by mouth daily.   tamsulosin 0.4 MG Caps capsule Commonly known as:  FLOMAX Take 0.4 mg by mouth at bedtime.        DISCHARGE INSTRUCTIONS:  Up with primary care physician in 3  days Follow-up with gastroenterology in 1 to 2 weeks   DIET:  Cardiac diet  DISCHARGE CONDITION:  Stable  ACTIVITY:  Activity as tolerated  OXYGEN:  Home Oxygen: No.   Oxygen Delivery: room air  DISCHARGE LOCATION:  home   If you experience worsening of your admission symptoms, develop shortness of breath, life threatening emergency, suicidal or homicidal thoughts you must seek medical attention immediately by calling 911 or calling your MD immediately  if symptoms less severe.  You Must read complete instructions/literature along with all the possible adverse reactions/side effects for all the Medicines you take and that have been prescribed to you. Take any new Medicines after you have completely understood and accpet all the possible adverse reactions/side effects.   Please note  You were cared for by a hospitalist during your hospital stay. If you have any questions about your discharge medications or the care you received while you were in the hospital after you are discharged, you can call the unit and asked to speak with the hospitalist on call if the hospitalist that took care of you is not available. Once you are discharged, your primary care physician will handle any further medical issues. Please note that NO REFILLS for any discharge medications will be authorized once you are discharged, as it is imperative that you return to your primary care physician (or establish a relationship with a primary care physician if you do not have one) for your aftercare needs so that they can reassess your need for medications and monitor your lab values.     Today  Chief Complaint  Patient presents with  . Rectal Bleeding   Patient tolerated colonoscopy.  Tolerating diet after procedure.  Okay to discharge patient from GI standpoint  ROS:  CONSTITUTIONAL: Denies fevers, chills. Denies any fatigue, weakness.  EYES: Denies blurry vision, double vision, eye pain. EARS, NOSE,  THROAT: Denies tinnitus, ear pain, hearing loss. RESPIRATORY: Denies cough, wheeze, shortness of breath.  CARDIOVASCULAR: Denies chest pain, palpitations, edema.  GASTROINTESTINAL: Denies nausea, vomiting, diarrhea, abdominal pain. Denies bright red blood per rectum. GENITOURINARY: Denies dysuria, hematuria. ENDOCRINE: Denies nocturia or thyroid problems. HEMATOLOGIC AND LYMPHATIC: Denies easy bruising or bleeding. SKIN: Denies rash or lesion. MUSCULOSKELETAL: Denies pain in neck, back, shoulder, knees, hips or arthritic symptoms.  NEUROLOGIC: Denies paralysis, paresthesias.  PSYCHIATRIC: Denies anxiety or depressive symptoms.   VITAL SIGNS:  Blood pressure (!) 117/59, pulse 67, temperature (!) 97.4 F (36.3 C), temperature source Oral, resp. rate 18, height 5\' 6"  (1.676 m), weight 81.6 kg (180 lb), SpO2 98 %.  I/O:    Intake/Output Summary (Last 24 hours) at 01/01/2018 1558 Last data filed at 01/01/2018 1250 Gross per 24 hour  Intake 730 ml  Output 100 ml  Net 630 ml    PHYSICAL EXAMINATION:  GENERAL:  77 y.o.-year-old patient lying in the bed with no acute distress.  EYES: Pupils equal, round, reactive to light and accommodation.  No scleral icterus. Extraocular muscles intact.  HEENT: Head atraumatic, normocephalic. Oropharynx and nasopharynx clear.  NECK:  Supple, no jugular venous distention. No thyroid enlargement, no tenderness.  LUNGS: Normal breath sounds bilaterally, no wheezing, rales,rhonchi or crepitation. No use of accessory muscles of respiration.  CARDIOVASCULAR: S1, S2 normal. No murmurs, rubs, or gallops.  ABDOMEN: Soft, non-tender, non-distended. Bowel sounds present. No organomegaly or mass.  EXTREMITIES: No pedal edema, cyanosis, or clubbing.  NEUROLOGIC: Cranial nerves II through XII are intact. Muscle strength 5/5 in all extremities. Sensation intact. Gait not checked.  PSYCHIATRIC: The patient is alert and oriented x 3.  SKIN: No obvious rash, lesion, or  ulcer.   DATA REVIEW:   CBC Recent Labs  Lab 01/01/18 0110  WBC 5.9  HGB 8.2*  HCT 23.6*  PLT 140*    Chemistries  Recent Labs  Lab 12/30/17 2104 12/31/17 0505  NA 142 143  K 3.4* 3.6  CL 104 111  CO2 30 27  GLUCOSE 118* 127*  BUN 18 18  CREATININE 1.08 0.85  CALCIUM 8.7* 7.8*  AST 20  --   ALT 29  --   ALKPHOS 105  --   BILITOT 0.6  --     Cardiac Enzymes No results for input(s): TROPONINI in the last 168 hours.  Microbiology Results  No results found for this or any previous visit.  RADIOLOGY:  No results found.  EKG:  No orders found for this or any previous visit.    Management plans discussed with the patient, family and they are in agreement.  CODE STATUS:     Code Status Orders  (From admission, onward)        Start     Ordered   12/30/17 2258  Full code  Continuous     12/30/17 2257    Code Status History    Date Active Date Inactive Code Status Order ID Comments User Context   07/25/2017 0952 07/25/2017 1733 Full Code [id number]  Teodoro Spray, MD Inpatient      TOTAL TIME TAKING CARE OF THIS PATIENT: 45 minutes.   Note: This dictation was prepared with Dragon dictation along with smaller phrase technology. Any transcriptional errors that result from this process are unintentional.   @[facility] @  on 01/01/2018 at 3:58 PM  Between 7am to 6pm - Pager - [phone]  After 6pm go to [email or web address] - password [email or web address]  Office  [phone]  CC: Primary care physician; Idelle Crouch, MD

## 2018-01-01 NOTE — Progress Notes (Signed)
Not overt blood noted in BMs overnight; NPO for colonscopy this am; VSS; off O2. Barbaraann Faster, RN 6:53 AM 01/01/2018

## 2018-01-02 LAB — SURGICAL PATHOLOGY

## 2018-01-04 ENCOUNTER — Encounter: Payer: Self-pay | Admitting: Gastroenterology

## 2018-01-10 ENCOUNTER — Ambulatory Visit: Payer: Medicare PPO | Admitting: Gastroenterology

## 2018-01-10 ENCOUNTER — Encounter: Payer: Self-pay | Admitting: Gastroenterology

## 2018-01-10 VITALS — BP 128/76 | HR 63 | Resp 17 | Wt 181.2 lb

## 2018-01-10 DIAGNOSIS — Z86010 Personal history of colonic polyps: Secondary | ICD-10-CM | POA: Diagnosis not present

## 2018-01-10 DIAGNOSIS — K921 Melena: Secondary | ICD-10-CM | POA: Diagnosis not present

## 2018-01-10 DIAGNOSIS — K2270 Barrett's esophagus without dysplasia: Secondary | ICD-10-CM

## 2018-01-10 NOTE — Progress Notes (Signed)
Jonathon Bellows MD, MRCP(U.K) [address]  [place]  [place], [place] [address]  Main: [phone]  Fax: [phone]   Primary Care Physician: Idelle Crouch, MD  Primary Gastroenterologist:  Dr. Jonathon Bellows   Chief Complaint  Patient presents with  . New Patient (Initial Visit)    discuss colonoscopy results     HPI: Warren Ingram is a 77 y.o. male    He is here today to see me for a hospital follow up. I was consulted and saw him on 12/31/17  When he was admitted via the ER for a GI bleed. He had a colonoscopy recently at [facility] and had a few polyps taken out.  After passing bright red blood per rectum he felt dizzy and lightheaded and came to the emergency room.  Colonoscopy was performed 12/24/2017.  9 polyps ranging from 3 to 8 mm in the sigmoid colon descending and transverse colon, cecum were resected with a hot snare.  Multiple diverticula were found in the sigmoid and descending colon.  EGD also showed features suggestive of Barrett's esophagus and biopsies were taken.His past history includes RFA at [facility] in 2017 .   Biopsies confirm Barrett's esophagus.  The polyps were adenomatous.  I repeated his colonoscopy on 12/31/17 and noted diverticulosis of the colon , no active bleeding, 5 polyps were resected and pathology returned as adenomas. In total across two procedures 14 polyps have been resected.   Since discharge doing well , no rectal bleeding. Hb repeated at [facility] was 8.7 grams.  Current Outpatient Medications  Medication Sig Dispense Refill  . acetaminophen (TYLENOL) 325 MG tablet Take 2 tablets (650 mg total) by mouth every 6 (six) hours as needed for mild pain (or Fever >/= 101).    Marland Kitchen aspirin (ASPIRIN EC) 81 MG EC tablet Take 81 mg by mouth daily. Swallow whole.    Marland Kitchen atorvastatin (LIPITOR) 80 MG tablet Take 80 mg by mouth at bedtime.     . ferrous sulfate 325 (65 FE) MG EC tablet Take 1 tablet (325 mg total) by mouth 2 (two) times daily. 60 tablet 3  .  furosemide (LASIX) 40 MG tablet Take 40 mg by mouth daily.    Marland Kitchen levothyroxine (SYNTHROID, LEVOTHROID) 150 MCG tablet Take 150 mcg by mouth daily before breakfast.    . metoprolol tartrate (LOPRESSOR) 25 MG tablet 25 mg 2 (two) times daily.     . Omega-3 Fatty Acids (FISH OIL) 1000 MG CAPS Take 2,000 mg by mouth daily.     Marland Kitchen omeprazole (PRILOSEC) 20 MG capsule Take 20 mg by mouth daily.    . tamsulosin (FLOMAX) 0.4 MG CAPS capsule Take 0.4 mg by mouth at bedtime.     . docusate sodium (COLACE) 100 MG capsule Take 1 capsule (100 mg total) by mouth 2 (two) times daily. (Patient not taking: Reported on 01/10/2018) 10 capsule 0   No current facility-administered medications for this visit.     Allergies as of 01/10/2018  . (No Known Allergies)    ROS:  General: Negative for anorexia, weight loss, fever, chills, fatigue, weakness. ENT: Negative for hoarseness, difficulty swallowing , nasal congestion. CV: Negative for chest pain, angina, palpitations, dyspnea on exertion, peripheral edema.  Respiratory: Negative for dyspnea at rest, dyspnea on exertion, cough, sputum, wheezing.  GI: See history of present illness. GU:  Negative for dysuria, hematuria, urinary incontinence, urinary frequency, nocturnal urination.  Endo: Negative for unusual weight change.    Physical Examination:  BP 128/76 (BP Location: Left Arm, Patient Position: Sitting, Cuff Size: Large)   Pulse 63   Resp 17   Wt 181 lb 3.2 oz (82.2 kg)   BMI 29.25 kg/m   General: Well-nourished, well-developed in no acute distress.  Eyes: No icterus. Conjunctivae pink. Mouth: Oropharyngeal mucosa moist and pink , no lesions erythema or exudate. Lungs: Clear to auscultation bilaterally. Non-labored. Heart: Regular rate and rhythm, no murmurs rubs or gallops.  Abdomen: Bowel sounds are normal, nontender, nondistended, no hepatosplenomegaly or masses, no abdominal bruits or hernia , no rebound or guarding.   Extremities: No lower  extremity edema. No clubbing or deformities. Neuro: Alert and oriented x 3.  Grossly intact. Skin: Warm and dry, no jaundice.   Psych: Alert and cooperative, normal mood and affect.   Imaging Studies: No results found.  Assessment and Plan:   Warren Ingram is a 77 y.o. y/o male here to see me after a hospital discharge. Admitted recently with rectal bleeding after a colonoscopy at [facility], I repeated a colonoscopy- no bleeding seen , more polyps taken out, likely diverticular bleed.   1. Genetic testing suggested- will contact insurance and if covered will check for ?attenuated FAP . Repeat colonoscopy in 3 years 2. Check CBC to ensure stable in a few weeks   3. Suggested family members to obtain colonoscopy if over age 67  4. Short segment of Barrets esophagus with no dysplasia. Repeat EGD in 3 years 5. Continue Prilosec   Dr Jonathon Bellows  MD,MRCP [facility]) Follow up PRN

## 2018-07-02 ENCOUNTER — Other Ambulatory Visit: Payer: Self-pay | Admitting: Orthopedic Surgery

## 2018-07-02 DIAGNOSIS — M25312 Other instability, left shoulder: Secondary | ICD-10-CM

## 2018-07-02 DIAGNOSIS — M25512 Pain in left shoulder: Secondary | ICD-10-CM

## 2018-07-02 DIAGNOSIS — S46002A Unspecified injury of muscle(s) and tendon(s) of the rotator cuff of left shoulder, initial encounter: Secondary | ICD-10-CM

## 2018-07-11 ENCOUNTER — Encounter (INDEPENDENT_AMBULATORY_CARE_PROVIDER_SITE_OTHER): Payer: Self-pay

## 2018-07-11 ENCOUNTER — Ambulatory Visit
Admission: RE | Admit: 2018-07-11 | Discharge: 2018-07-11 | Disposition: A | Discharge status: Home / Self Care | Payer: Medicare PPO | Source: Ambulatory Visit | Attending: Orthopedic Surgery | Admitting: Orthopedic Surgery

## 2018-07-11 DIAGNOSIS — M25312 Other instability, left shoulder: Secondary | ICD-10-CM | POA: Diagnosis present

## 2018-07-11 DIAGNOSIS — S46002A Unspecified injury of muscle(s) and tendon(s) of the rotator cuff of left shoulder, initial encounter: Secondary | ICD-10-CM | POA: Insufficient documentation

## 2018-07-11 DIAGNOSIS — M25512 Pain in left shoulder: Secondary | ICD-10-CM | POA: Insufficient documentation

## 2018-12-12 ENCOUNTER — Other Ambulatory Visit: Payer: Self-pay

## 2018-12-12 ENCOUNTER — Encounter
Admission: RE | Admit: 2018-12-12 | Discharge: 2018-12-12 | Disposition: A | Discharge status: Home / Self Care | Payer: Medicare PPO | Source: Ambulatory Visit | Attending: Surgery | Admitting: Surgery

## 2018-12-12 DIAGNOSIS — I2510 Atherosclerotic heart disease of native coronary artery without angina pectoris: Secondary | ICD-10-CM | POA: Diagnosis not present

## 2018-12-12 DIAGNOSIS — R9431 Abnormal electrocardiogram [ECG] [EKG]: Secondary | ICD-10-CM | POA: Insufficient documentation

## 2018-12-12 DIAGNOSIS — R001 Bradycardia, unspecified: Secondary | ICD-10-CM | POA: Insufficient documentation

## 2018-12-12 DIAGNOSIS — Z01818 Encounter for other preprocedural examination: Secondary | ICD-10-CM | POA: Insufficient documentation

## 2018-12-12 DIAGNOSIS — I10 Essential (primary) hypertension: Secondary | ICD-10-CM | POA: Insufficient documentation

## 2018-12-12 HISTORY — DX: Malignant (primary) neoplasm, unspecified: C80.1

## 2018-12-12 LAB — CBC
HCT: 40.5 % (ref 39.0–52.0)
Hemoglobin: 12.8 g/dL — ABNORMAL LOW (ref 13.0–17.0)
MCH: 26.2 pg (ref 26.0–34.0)
MCHC: 31.6 g/dL (ref 30.0–36.0)
MCV: 82.8 fL (ref 80.0–100.0)
Platelets: 209 10*3/uL (ref 150–400)
RBC: 4.89 MIL/uL (ref 4.22–5.81)
RDW: 16.1 % — ABNORMAL HIGH (ref 11.5–15.5)
WBC: 6.8 10*3/uL (ref 4.0–10.5)
nRBC: 0 % (ref 0.0–0.2)

## 2018-12-12 LAB — BASIC METABOLIC PANEL
Anion gap: 9 (ref 5–15)
BUN: 20 mg/dL (ref 8–23)
CO2: 25 mmol/L (ref 22–32)
Calcium: 9 mg/dL (ref 8.9–10.3)
Chloride: 104 mmol/L (ref 98–111)
Creatinine, Ser: 0.83 mg/dL (ref 0.61–1.24)
GFR calc Af Amer: >60 mL/min (ref >60)
GFR calc non Af Amer: >60 mL/min (ref >60)
Glucose, Bld: 102 mg/dL — ABNORMAL HIGH (ref 70–99)
Potassium: 4 mmol/L (ref 3.5–5.1)
Sodium: 138 mmol/L (ref 135–145)

## 2018-12-12 LAB — URINALYSIS, ROUTINE W REFLEX MICROSCOPIC
Bilirubin Urine: NEGATIVE
Glucose, UA: 150 mg/dL — AB
Hgb urine dipstick: NEGATIVE
Ketones, ur: NEGATIVE mg/dL
Leukocytes,Ua: NEGATIVE
Nitrite: NEGATIVE
Protein, ur: NEGATIVE mg/dL
Specific Gravity, Urine: 1.019 (ref 1.005–1.030)
pH: 6 (ref 5.0–8.0)

## 2018-12-12 LAB — TYPE AND SCREEN
ABO/RH(D): A POS
Antibody Screen: NEGATIVE

## 2018-12-12 LAB — SURGICAL PCR SCREEN
MRSA, PCR: NEGATIVE
Staphylococcus aureus: NEGATIVE

## 2018-12-12 NOTE — Patient Instructions (Addendum)
Your procedure is scheduled on: 12-19-18 THURSDAY Report to Same Day Surgery 2nd floor medical mall [facility] Entrance-take elevator on left to 2nd floor.  Check in with surgery information desk.) To find out your arrival time please call [phone] between 1PM - 3PM on 12-18-[facility]  Remember: Instructions that are not followed completely may result in serious medical risk, up to and including death, or upon the discretion of your surgeon and anesthesiologist your surgery may need to be rescheduled.    _x___ 1. Do not eat food after midnight the night before your procedure. NO GUM OR CANDY AFTER MIDNIGHT. You may drink clear liquids up to 2 hours before you are scheduled to arrive at the hospital for your procedure.  Do not drink clear liquids within 2 hours of your scheduled arrival to the hospital.  Clear liquids include  --Water or Apple juice without pulp  --Clear carbohydrate beverage such as ClearFast or Gatorade  --Black Coffee or Clear Tea (No milk, no creamers, do not add anything to the coffee or Tea   ____Ensure clear carbohydrate drink on the way to the hospital for bariatric patients  ____Ensure clear carbohydrate drink 3 hours before surgery for Dr Dwyane Luo patients if physician instructed.    __x__ 2. No Alcohol for 24 hours before or after surgery.   __x__3. No Smoking or e-cigarettes for 24 prior to surgery.  Do not use any chewable tobacco products for at least 6 hour prior to surgery   ____  4. Bring all medications with you on the day of surgery if instructed.    __x__ 5. Notify your doctor if there is any change in your medical condition     (cold, fever, infections).    x___6. On the morning of surgery brush your teeth with toothpaste and water.  You may rinse your mouth with mouth wash if you wish.  Do not swallow any toothpaste or mouthwash.   Do not wear jewelry, make-up, hairpins, clips or nail polish.  Do not wear lotions, powders, or perfumes.  You may wear deodorant.  Do not shave 48 hours prior to surgery. Men may shave face and neck.  Do not bring valuables to the hospital.    [facility] is not responsible for any belongings or valuables.               Contacts, dentures or bridgework may not be worn into surgery.  Leave your suitcase in the car. After surgery it may be brought to your room.  For patients admitted to the hospital, discharge time is determined by your treatment team.  _  Patients discharged the day of surgery will not be allowed to drive home.  You will need someone to drive you home and stay with you the night of your procedure.    Please read over the following fact sheets that you were given:   [facility] Preparing for Surgery and or MRSA Information   _x___ TAKE THE FOLLOWING MEDICATION THE MORNING OF SURGERY WITH A SMALL SIP OF WATER. These include:  1. LEVOTHYROXINE   2. METOPROLOL  3.  4. PRILOSEC (OMEPRAZOLE)  5. TAKE AN EXTRA PRILOSEC THE NIGHT BEFORE YOUR SURGERY  6.  ____Fleets enema or Magnesium Citrate as directed.   _x___ Use CHG Soap or sage wipes as directed on instruction sheet   ____ Use inhalers on the day of surgery and bring to hospital day of surgery  ____ Stop Metformin and Janumet 2 days prior  to surgery.    ____ Take 1/2 of usual insulin dose the night before surgery and none on the morning surgery.   _x___ Follow recommendations from Cardiologist, Pulmonologist or PCP regarding stopping Aspirin, Coumadin, Plavix ,Eliquis, Effient, or Pradaxa, and Pletal-ASK DR POGGI ABOUT STOPPING YOUR ASPIRIN  X____Stop Anti-inflammatories such as Advil, Aleve, Ibuprofen, Motrin, Naproxen, Naprosyn, Goodies powders or aspirin products NOW-OK to take Tylenol    _x___ Stop supplements until after surgery-STOP FISH OIL NOW-MAY RESUME AFTER SURGERY   ____ Bring C-Pap to the hospital.

## 2018-12-12 NOTE — Pre-Procedure Instructions (Signed)
Progress Notes - documented in this encounter [name], MD - 08/15/2018 9:00 AM EDT Formatting of this note might be different from the original.   Chief Complaint: Chief Complaint  Patient presents with  . Follow-up  6 months  Date of Service: 08/15/2018 Date of Birth: 09/17/1940 PCP: Idelle Crouch, MD  History of Present Illness: Warren Ingram is a 78 y.o.male patient who has a history of coronary artery disease status post PCI in 2010. He has been on dual anti-platelet therapy since that time. Patient began having chest pain. He underwent left heart cath which revealed 100% proximal RCA, 95% proximal to mid LAD, 90% second marginal and a 90% mid to distal circumflex. He was referred for coronary bypass grafting at [facility] where he underwent coronary bypass grafting x4 including left internal mammary to the LAD, saphenous vein graft to PDA, D1, OM1. He did well post procedure. He is now in cardiac rehab and gradually increasing his activity. Had A. fib postop but is in sinus rhythm at present on low-dose metoprolol. He gets dizzy on occasion. He has no chest pain. Past Medical and Surgical History  Past Medical History Past Medical History:  Diagnosis Date  . Barrett's esophagus 03/10/2010  06/25/2014 long segment  . Benign neoplasm of colon 08/21/2013  . Coronary atherosclerosis of native coronary artery 08/21/2013  . Diverticulitis of colon (without mention of hemorrhage)(562.11) 08/21/2013  . Esophageal reflux 08/21/2013  . Essential hypertension, benign 08/21/2013  . Hemorrhage of rectum and anus 08/21/2013  . Osteoarthrosis, unspecified whether generalized or localized, lower leg 08/21/2013  . Osteoarthrosis, unspecified whether generalized or localized, pelvic region and thigh 08/21/2013  . Personal history of colonic polyps 2007, 2011  +TA  . Pure hypercholesterolemia 08/21/2013  . Unspecified hypothyroidism 08/21/2013   Past Surgical History He has a past  surgical history that includes Arthroscopy Knee (Left); Vasectomy; Colonoscopy (12/04/2005,03/10/2010); upper endoscopy (12/04/2005, 03/10/2010); Stent Placement Intracranial Percutaneous; flexible sigmoidoscopy (04/30/1996); Colonoscopy (06/25/2014); egd (06/25/2014); EGD with BARRX (10/28/2015); Knee arthroscopy; and Vasectomy.   Medications and Allergies  Current Medications  Current Outpatient Medications  Medication Sig Dispense Refill  . acetaminophen (TYLENOL) 500 mg capsule  . AMIOdarone (PACERONE) 200 MG tablet Take by mouth  . aspirin 81 MG EC tablet Take 81 mg by mouth once daily.  Marland Kitchen atorvastatin (LIPITOR) 80 MG tablet TAKE 1 TABLET (80 MG TOTAL) BY MOUTH ONCE DAILY. 90 tablet 3  . clotrimazole-betamethasone (LOTRISONE) 1-0.05 % cream Apply topically 2 (two) times daily 45 g 5  . ferrous sulfate 325 (65 FE) MG EC tablet Take by mouth  . fluticasone propionate (FLONASE) 50 mcg/actuation nasal spray USE 2 SPRAYS INTO BOTH NOSTRILS ONE TIME DAILY 48 g 1  . FUROsemide (LASIX) 40 MG tablet Take by mouth  . levothyroxine (SYNTHROID, LEVOTHROID) 150 MCG tablet TAKE 1 TABLET ONCE DAILY ON AN EMPTY STOMACH WITH A GLASS OF WATER AT LEAST 30 TO 60 MINUTES BEFORE BREAKFAST 90 tablet 3  . meloxicam (MOBIC) 15 MG tablet  . omega-3 fatty acids/fish oil 340-1,000 mg capsule Take 2 capsules by mouth 2 (two) times daily.  Marland Kitchen omeprazole (PRILOSEC) 20 MG DR capsule TAKE 1 CAPSULE EVERY DAY 90 capsule 3  . sildenafil (VIAGRA) 100 MG tablet Take 100 mg by mouth once daily as needed for Erectile Dysfunction.  . tamsulosin (FLOMAX) 0.4 mg capsule  . telmisartan (MICARDIS) 40 MG tablet Take 1 tablet (40 mg total) by mouth once daily 30 tablet 11  No current facility-administered medications for this visit.   Allergies: Patient has no known allergies.  Social and Family History  Social History reports that he has quit smoking. He has a 20.00 pack-year smoking history. He uses smokeless tobacco. He reports  current alcohol use. He reports that he does not use drugs.  Family History Family History  Problem Relation Age of Onset  . Myocardial Infarction (Heart attack) Mother  . Lung disease Father   Review of Systems  Review of Systems  Constitutional: Negative for chills, diaphoresis, fever, malaise/fatigue and weight loss.  HENT: Negative for congestion, ear discharge, hearing loss and tinnitus.  Eyes: Negative for blurred vision.  Respiratory: Negative for cough, hemoptysis, sputum production, shortness of breath and wheezing.  Cardiovascular: Negative for palpitations, orthopnea, claudication, leg swelling and PND.  Gastrointestinal: Negative for abdominal pain, blood in stool, constipation, diarrhea, melena, nausea and vomiting.  Genitourinary: Negative for dysuria, frequency, hematuria and urgency.  Musculoskeletal: Negative for back pain, falls, joint pain and myalgias.  Skin: Negative for itching and rash.  Neurological: Positive for dizziness. Negative for tingling, focal weakness, loss of consciousness, weakness and headaches.  Endo/Heme/Allergies: Negative for polydipsia. Does not bruise/bleed easily.  Psychiatric/Behavioral: Negative for depression, memory loss and substance abuse. The patient is not nervous/anxious.    Physical Examination   Vitals: BP 136/80  Pulse (!) 44  Ht 162.6 cm (5\' 4" )  Wt 88 kg (194 lb)  SpO2 98%  BMI 33.30 kg/m  Ht:162.6 cm (5\' 4" ) Wt:88 kg (194 lb) MLJ:QGBE surface area is 1.99 meters squared. Body mass index is 33.3 kg/m.  Wt Readings from Last 3 Encounters:  08/15/18 88 kg (194 lb)  07/25/18 89.8 kg (198 lb)  07/02/18 89.9 kg (198 lb 3.2 oz)   BP Readings from Last 3 Encounters:  08/15/18 136/80  07/25/18 158/78  07/02/18 168/79  general: Caucasian male in no acute stress  LUNGS Breath Sounds: Normal Percussion: Normal  CARDIOVASCULAR JVP CV wave: no HJR: no Elevation at 90 degrees: None Carotid Pulse: normal pulsation  bilaterally Bruit: None Apex: apical impulse normal  Auscultation Rhythm: sinus bradycardia, rate=44 S1: normal S2: normal Clicks: no Rub: no Murmurs: no murmurs  Gallop: None ABDOMEN Liver enlargement: no Pulsatile aorta: no Ascites: no Bruits: no  EXTREMITIES Clubbing: no Edema: trace to 1+ bilateral pedal edema Pulses: peripheral pulses symmetrical Femoral Bruits: no Amputation: no SKIN Rash: no Cyanosis: no Embolic phemonenon: no Bruising: no NEURO Alert and Oriented to person, place and time: yes Non focal: yes LABS Last 3 CBC results: Lab Results  Component Value Date  WBC 6.3 01/04/2018  WBC 6.6 12/10/2017  WBC 6.4 07/24/2017   Lab Results  Component Value Date  HGB 8.7 (L) 01/04/2018  HGB 14.7 12/10/2017  HGB 14.7 07/24/2017   Lab Results  Component Value Date  HCT 26.1 (L) 01/04/2018  HCT 44.2 12/10/2017  HCT 42.7 07/24/2017   Lab Results  Component Value Date  PLT 195 01/04/2018  PLT 185 12/10/2017  PLT 206 07/24/2017   Lab Results  Component Value Date  CREATININE 1.0 01/04/2018  BUN 8 01/04/2018  NA 142 01/04/2018  K 3.7 01/04/2018  CL 100 01/04/2018  CO2 35.3 (H) 01/04/2018   Lab Results  Component Value Date  HGBA1C 6.9 (H) 12/10/2017   Lab Results  Component Value Date  HDL 32.9 12/10/2017  HDL 33.1 03/29/2017  HDL 31.3 09/25/2016   Lab Results  Component Value Date  LDLCALC 97 12/10/2017  LDLCALC 127 03/29/2017  Dix 127 09/25/2016   Lab Results  Component Value Date  TRIG 190 12/10/2017  TRIG 183 03/29/2017  TRIG 150 09/25/2016   Lab Results  Component Value Date  ALT 19 01/04/2018  AST 15 01/04/2018  ALKPHOS 105 (H) 01/04/2018   Lab Results  Component Value Date  TSH 0.911 12/10/2017     Assessment and Plan   78 y.o. male with  ICD-10-CM ICD-9-CM  1. Atherosclerosis of native coronary artery of native heart without angina pectoris-status post coronary bypass grafting x4 as mentioned above.  Doing well postop. Had postoperative atrial fibrillation. He remains in sinus rhythm. Will remain off of amiodarone and Eliquis. I25.10 414.01  2. Essential hypertension, benign-blood pressure is controlled with current regimen he is bradycardic so we will discontinue metoprolol and add telmisartan. I10 401.1  3. Pure hypercholesterolemia-hyperlipidemia is controlled with aggressive treatment with atorvastatin at 80 mg daily as well as gemfibrozil and Omega 3 fatty acids LDL goal of less than 100. Most recent level is 97 E78.0 272.0   Return in about 6 months (around 02/15/2019).  These notes generated with voice recognition software. I apologize for typographical errors.  [name], MD    Electronically signed by [name], MD at 08/15/2018 9:45 AM EDT

## 2018-12-12 NOTE — Pre-Procedure Instructions (Signed)
ECG 12-lead4/30/2019 [place] Component Name Value Ref Range  Vent Rate (bpm) 55   PR Interval (msec) 150   QRS Interval (msec) 70   QT Interval (msec) 518   QTc (msec) 495   Other Result Information  This result has an attachment that is not available.  Result Narrative  Sinus bradycardia Right atrial enlargement   When compared with ECG of 24-Jul-2017 14:03, ST elevation now present in Inferior leads T wave inversion less evident in Inferior leads T wave inversion more evident in Lateral leads I reviewed and concur with this report. Electronically signed QV:LDKC MD, KEN [phone]) on 10/04/2017 1:47:34 PM  Status Results Details   Encounter Summary

## 2018-12-13 LAB — URINE CULTURE: Culture: NO GROWTH

## 2018-12-13 NOTE — Pre-Procedure Instructions (Signed)
MESSAGED DR Ola Spurr ON 12-12-18 REGARDING ABNORMAL EKG. SHE WANTS CARDIAC CLEARANCE. FAXED CLEARANCE TO DR POGGIS OFFICE AND TO DR FATHS OFFICE. CALLED AND LEFT MESSAGE FOR TIFFANY AT DR POGGIS AND INFORMED HER THROUGH VM THAT CLEARANCE IS NEEDED AND THAT SHE NEEDS TO F/U WITH CLEARANCE ON MONDAY

## 2018-12-16 ENCOUNTER — Other Ambulatory Visit
Admission: RE | Admit: 2018-12-16 | Discharge: 2018-12-16 | Disposition: A | Discharge status: Home / Self Care | Payer: Medicare PPO | Source: Ambulatory Visit | Attending: Surgery | Admitting: Surgery

## 2018-12-16 ENCOUNTER — Other Ambulatory Visit: Payer: Self-pay

## 2018-12-16 DIAGNOSIS — Z1159 Encounter for screening for other viral diseases: Secondary | ICD-10-CM | POA: Insufficient documentation

## 2018-12-17 LAB — SARS CORONAVIRUS 2 (TAT 6-24 HRS): SARS Coronavirus 2: NEGATIVE

## 2018-12-18 NOTE — Pre-Procedure Instructions (Signed)
CARDIAC CLEARANCE ON CHART FROM DR FATH-LOW RISK

## 2018-12-19 ENCOUNTER — Encounter: Payer: Self-pay | Admitting: *Deleted

## 2018-12-19 ENCOUNTER — Ambulatory Visit: Payer: Medicare PPO | Admitting: Anesthesiology

## 2018-12-19 ENCOUNTER — Observation Stay
Admission: RE | Admit: 2018-12-19 | Discharge: 2018-12-22 | Disposition: A | Discharge status: Home / Self Care | Payer: Medicare PPO | Source: Ambulatory Visit | Attending: Surgery | Admitting: Surgery

## 2018-12-19 ENCOUNTER — Encounter
Admission: RE | Disposition: A | Discharge status: Home / Self Care | Payer: Self-pay | Source: Ambulatory Visit | Attending: Surgery

## 2018-12-19 ENCOUNTER — Inpatient Hospital Stay: Payer: Medicare PPO

## 2018-12-19 ENCOUNTER — Other Ambulatory Visit: Payer: Self-pay

## 2018-12-19 DIAGNOSIS — Z8249 Family history of ischemic heart disease and other diseases of the circulatory system: Secondary | ICD-10-CM | POA: Diagnosis not present

## 2018-12-19 DIAGNOSIS — Z791 Long term (current) use of non-steroidal anti-inflammatories (NSAID): Secondary | ICD-10-CM | POA: Insufficient documentation

## 2018-12-19 DIAGNOSIS — E7800 Pure hypercholesterolemia, unspecified: Secondary | ICD-10-CM | POA: Insufficient documentation

## 2018-12-19 DIAGNOSIS — Z951 Presence of aortocoronary bypass graft: Secondary | ICD-10-CM | POA: Insufficient documentation

## 2018-12-19 DIAGNOSIS — Z7951 Long term (current) use of inhaled steroids: Secondary | ICD-10-CM | POA: Diagnosis not present

## 2018-12-19 DIAGNOSIS — Z87891 Personal history of nicotine dependence: Secondary | ICD-10-CM | POA: Insufficient documentation

## 2018-12-19 DIAGNOSIS — I2510 Atherosclerotic heart disease of native coronary artery without angina pectoris: Secondary | ICD-10-CM | POA: Diagnosis not present

## 2018-12-19 DIAGNOSIS — M1712 Unilateral primary osteoarthritis, left knee: Secondary | ICD-10-CM | POA: Diagnosis not present

## 2018-12-19 DIAGNOSIS — Z85828 Personal history of other malignant neoplasm of skin: Secondary | ICD-10-CM | POA: Diagnosis not present

## 2018-12-19 DIAGNOSIS — E039 Hypothyroidism, unspecified: Secondary | ICD-10-CM | POA: Insufficient documentation

## 2018-12-19 DIAGNOSIS — E785 Hyperlipidemia, unspecified: Secondary | ICD-10-CM | POA: Insufficient documentation

## 2018-12-19 DIAGNOSIS — K219 Gastro-esophageal reflux disease without esophagitis: Secondary | ICD-10-CM | POA: Diagnosis not present

## 2018-12-19 DIAGNOSIS — G8918 Other acute postprocedural pain: Secondary | ICD-10-CM | POA: Insufficient documentation

## 2018-12-19 DIAGNOSIS — Z79899 Other long term (current) drug therapy: Secondary | ICD-10-CM | POA: Diagnosis not present

## 2018-12-19 DIAGNOSIS — Z96652 Presence of left artificial knee joint: Secondary | ICD-10-CM

## 2018-12-19 DIAGNOSIS — Z79890 Hormone replacement therapy (postmenopausal): Secondary | ICD-10-CM | POA: Diagnosis not present

## 2018-12-19 DIAGNOSIS — R339 Retention of urine, unspecified: Secondary | ICD-10-CM | POA: Diagnosis not present

## 2018-12-19 DIAGNOSIS — Z8673 Personal history of transient ischemic attack (TIA), and cerebral infarction without residual deficits: Secondary | ICD-10-CM | POA: Diagnosis not present

## 2018-12-19 DIAGNOSIS — I10 Essential (primary) hypertension: Secondary | ICD-10-CM | POA: Insufficient documentation

## 2018-12-19 DIAGNOSIS — Z7982 Long term (current) use of aspirin: Secondary | ICD-10-CM | POA: Diagnosis not present

## 2018-12-19 HISTORY — PX: TOTAL KNEE ARTHROPLASTY: SHX125

## 2018-12-19 SURGERY — ARTHROPLASTY, KNEE, TOTAL
Anesthesia: Spinal | Laterality: Left

## 2018-12-19 MED ORDER — SODIUM CHLORIDE 0.9 % IV SOLN
INTRAVENOUS | Status: DC | PRN
  Administered 2018-12-19: 11:00:00 60 mL

## 2018-12-19 MED ORDER — ASPIRIN EC 81 MG PO TBEC
81.0000 mg | DELAYED_RELEASE_TABLET | Freq: Every day | ORAL | Status: DC
  Administered 2018-12-20 – 2018-12-22 (×3): 81 mg via ORAL
  Filled 2018-12-19 (×3): qty 1

## 2018-12-19 MED ORDER — CEFAZOLIN SODIUM-DEXTROSE 2-4 GM/100ML-% IV SOLN
2.0000 g | Freq: Four times a day (QID) | INTRAVENOUS | Status: AC
  Administered 2018-12-19 – 2018-12-20 (×3): 2 g via INTRAVENOUS
  Filled 2018-12-19 (×3): qty 100

## 2018-12-19 MED ORDER — FENTANYL CITRATE (PF) 100 MCG/2ML IJ SOLN
INTRAMUSCULAR | Status: DC | PRN
  Administered 2018-12-19 (×2): 50 ug via INTRAVENOUS

## 2018-12-19 MED ORDER — SODIUM CHLORIDE 0.9 % IV SOLN
INTRAVENOUS | Status: DC
  Administered 2018-12-19: 15:00:00 via INTRAVENOUS

## 2018-12-19 MED ORDER — LEVOTHYROXINE SODIUM 50 MCG PO TABS
150.0000 ug | ORAL_TABLET | Freq: Every day | ORAL | Status: DC
  Administered 2018-12-20 – 2018-12-22 (×3): 150 ug via ORAL
  Filled 2018-12-19 (×3): qty 1

## 2018-12-19 MED ORDER — KETOROLAC TROMETHAMINE 15 MG/ML IJ SOLN
INTRAMUSCULAR | Status: AC
  Administered 2018-12-19: 13:00:00 15 mg via INTRAVENOUS
  Filled 2018-12-19: qty 1

## 2018-12-19 MED ORDER — DIPHENHYDRAMINE HCL 12.5 MG/5ML PO ELIX: 12.5000 mg | ORAL_SOLUTION | ORAL | Status: DC | PRN

## 2018-12-19 MED ORDER — GLYCOPYRROLATE 0.2 MG/ML IJ SOLN
INTRAMUSCULAR | Status: DC | PRN
  Administered 2018-12-19: 0.1 mg via INTRAVENOUS

## 2018-12-19 MED ORDER — BUPIVACAINE-EPINEPHRINE (PF) 0.5% -1:200000 IJ SOLN
INTRAMUSCULAR | Status: DC | PRN
  Administered 2018-12-19: 30 mL

## 2018-12-19 MED ORDER — PROPOFOL 500 MG/50ML IV EMUL
INTRAVENOUS | Status: DC | PRN
  Administered 2018-12-19: 50 ug/kg/min via INTRAVENOUS

## 2018-12-19 MED ORDER — PROPOFOL 500 MG/50ML IV EMUL
INTRAVENOUS | Status: AC
  Filled 2018-12-19: qty 50

## 2018-12-19 MED ORDER — HYDRALAZINE HCL 25 MG PO TABS
25.0000 mg | ORAL_TABLET | Freq: Three times a day (TID) | ORAL | Status: DC
  Administered 2018-12-20: 25 mg via ORAL
  Filled 2018-12-19 (×2): qty 1

## 2018-12-19 MED ORDER — ACETAMINOPHEN 500 MG PO TABS
1000.0000 mg | ORAL_TABLET | Freq: Four times a day (QID) | ORAL | Status: AC
  Administered 2018-12-19 – 2018-12-20 (×4): 1000 mg via ORAL
  Filled 2018-12-19 (×4): qty 2

## 2018-12-19 MED ORDER — FLEET ENEMA 7-19 GM/118ML RE ENEM: 1.0000 | ENEMA | Freq: Once | RECTAL | Status: DC | PRN

## 2018-12-19 MED ORDER — BISACODYL 10 MG RE SUPP: 10.0000 mg | Freq: Every day | RECTAL | Status: DC | PRN

## 2018-12-19 MED ORDER — KETOROLAC TROMETHAMINE 15 MG/ML IJ SOLN
7.5000 mg | Freq: Four times a day (QID) | INTRAMUSCULAR | Status: AC
  Administered 2018-12-19 – 2018-12-20 (×4): 7.5 mg via INTRAVENOUS
  Filled 2018-12-19 (×4): qty 1

## 2018-12-19 MED ORDER — FENTANYL CITRATE (PF) 100 MCG/2ML IJ SOLN
INTRAMUSCULAR | Status: AC
  Filled 2018-12-19: qty 2

## 2018-12-19 MED ORDER — ONDANSETRON HCL 4 MG PO TABS
4.0000 mg | ORAL_TABLET | Freq: Four times a day (QID) | ORAL | Status: DC | PRN
  Administered 2018-12-21: 4 mg via ORAL
  Filled 2018-12-19: qty 1

## 2018-12-19 MED ORDER — METOCLOPRAMIDE HCL 10 MG PO TABS: 5.0000 mg | ORAL_TABLET | Freq: Three times a day (TID) | ORAL | Status: DC | PRN

## 2018-12-19 MED ORDER — OXYCODONE HCL 5 MG PO TABS
5.0000 mg | ORAL_TABLET | ORAL | Status: DC | PRN
  Administered 2018-12-19 – 2018-12-20 (×2): 5 mg via ORAL
  Filled 2018-12-19 (×2): qty 1

## 2018-12-19 MED ORDER — HYDROMORPHONE HCL 1 MG/ML IJ SOLN: 0.2500 mg | INTRAMUSCULAR | Status: DC | PRN

## 2018-12-19 MED ORDER — LACTATED RINGERS IV SOLN
INTRAVENOUS | Status: DC
  Administered 2018-12-19 (×2): via INTRAVENOUS

## 2018-12-19 MED ORDER — BUPIVACAINE HCL (PF) 0.5 % IJ SOLN
INTRAMUSCULAR | Status: DC | PRN
  Administered 2018-12-19: 3 mL

## 2018-12-19 MED ORDER — DOCUSATE SODIUM 100 MG PO CAPS
100.0000 mg | ORAL_CAPSULE | Freq: Two times a day (BID) | ORAL | Status: DC
  Administered 2018-12-19 – 2018-12-22 (×6): 100 mg via ORAL
  Filled 2018-12-19 (×6): qty 1

## 2018-12-19 MED ORDER — LABETALOL HCL 5 MG/ML IV SOLN
10.0000 mg | Freq: Once | INTRAVENOUS | Status: AC
  Administered 2018-12-19: 10 mg via INTRAVENOUS
  Filled 2018-12-19: qty 4

## 2018-12-19 MED ORDER — PROPOFOL 10 MG/ML IV BOLUS
INTRAVENOUS | Status: AC
  Filled 2018-12-19: qty 20

## 2018-12-19 MED ORDER — ACETAMINOPHEN 10 MG/ML IV SOLN
INTRAVENOUS | Status: AC
  Filled 2018-12-19: qty 100

## 2018-12-19 MED ORDER — ACETAMINOPHEN 10 MG/ML IV SOLN
INTRAVENOUS | Status: DC | PRN
  Administered 2018-12-19: 1000 mg via INTRAVENOUS

## 2018-12-19 MED ORDER — MIDAZOLAM HCL 2 MG/2ML IJ SOLN
INTRAMUSCULAR | Status: AC
  Filled 2018-12-19: qty 2

## 2018-12-19 MED ORDER — ACETAMINOPHEN 325 MG PO TABS
325.0000 mg | ORAL_TABLET | Freq: Four times a day (QID) | ORAL | Status: DC | PRN
  Administered 2018-12-20: 650 mg via ORAL
  Filled 2018-12-19: qty 2

## 2018-12-19 MED ORDER — ONDANSETRON HCL 4 MG/2ML IJ SOLN: 4.0000 mg | Freq: Four times a day (QID) | INTRAMUSCULAR | Status: DC | PRN

## 2018-12-19 MED ORDER — METOPROLOL TARTRATE 25 MG PO TABS
25.0000 mg | ORAL_TABLET | Freq: Two times a day (BID) | ORAL | Status: DC
  Administered 2018-12-19 – 2018-12-22 (×6): 25 mg via ORAL
  Filled 2018-12-19 (×6): qty 1

## 2018-12-19 MED ORDER — BUPIVACAINE HCL (PF) 0.5 % IJ SOLN
INTRAMUSCULAR | Status: AC
  Filled 2018-12-19: qty 10

## 2018-12-19 MED ORDER — BUPIVACAINE LIPOSOME 1.3 % IJ SUSP
INTRAMUSCULAR | Status: AC
  Filled 2018-12-19: qty 20

## 2018-12-19 MED ORDER — OMEGA-3-ACID ETHYL ESTERS 1 G PO CAPS
1.0000 g | ORAL_CAPSULE | Freq: Two times a day (BID) | ORAL | Status: DC
  Administered 2018-12-19 – 2018-12-21 (×5): 1 g via ORAL
  Filled 2018-12-19 (×7): qty 1

## 2018-12-19 MED ORDER — CEFAZOLIN SODIUM-DEXTROSE 2-4 GM/100ML-% IV SOLN
INTRAVENOUS | Status: AC
  Filled 2018-12-19: qty 100

## 2018-12-19 MED ORDER — FLUTICASONE PROPIONATE 50 MCG/ACT NA SUSP
1.0000 | Freq: Two times a day (BID) | NASAL | Status: DC | PRN
  Filled 2018-12-19: qty 16

## 2018-12-19 MED ORDER — BUPIVACAINE-EPINEPHRINE (PF) 0.5% -1:200000 IJ SOLN
INTRAMUSCULAR | Status: AC
  Filled 2018-12-19: qty 30

## 2018-12-19 MED ORDER — ATORVASTATIN CALCIUM 20 MG PO TABS
80.0000 mg | ORAL_TABLET | Freq: Every day | ORAL | Status: DC
  Administered 2018-12-19 – 2018-12-21 (×3): 80 mg via ORAL
  Filled 2018-12-19 (×3): qty 4

## 2018-12-19 MED ORDER — HYDRALAZINE HCL 20 MG/ML IJ SOLN
10.0000 mg | Freq: Four times a day (QID) | INTRAMUSCULAR | Status: DC | PRN
  Administered 2018-12-19: 10 mg via INTRAVENOUS
  Filled 2018-12-19: qty 1

## 2018-12-19 MED ORDER — TRANEXAMIC ACID 1000 MG/10ML IV SOLN
INTRAVENOUS | Status: DC | PRN
  Administered 2018-12-19: 1000 mg via INTRAVENOUS

## 2018-12-19 MED ORDER — IRBESARTAN 150 MG PO TABS
150.0000 mg | ORAL_TABLET | Freq: Every day | ORAL | Status: DC
  Administered 2018-12-19 – 2018-12-22 (×4): 150 mg via ORAL
  Filled 2018-12-19 (×4): qty 1

## 2018-12-19 MED ORDER — TRAMADOL HCL 50 MG PO TABS
50.0000 mg | ORAL_TABLET | Freq: Four times a day (QID) | ORAL | Status: DC | PRN
  Administered 2018-12-19 – 2018-12-21 (×2): 50 mg via ORAL
  Filled 2018-12-19 (×2): qty 1

## 2018-12-19 MED ORDER — PROPOFOL 10 MG/ML IV BOLUS
INTRAVENOUS | Status: DC | PRN
  Administered 2018-12-19: 20 mg via INTRAVENOUS

## 2018-12-19 MED ORDER — MIDAZOLAM HCL 5 MG/5ML IJ SOLN
INTRAMUSCULAR | Status: DC | PRN
  Administered 2018-12-19 (×2): 1 mg via INTRAVENOUS

## 2018-12-19 MED ORDER — FAMOTIDINE 20 MG PO TABS
ORAL_TABLET | ORAL | Status: AC
  Filled 2018-12-19: qty 1

## 2018-12-19 MED ORDER — MAGNESIUM HYDROXIDE 400 MG/5ML PO SUSP
30.0000 mL | Freq: Every day | ORAL | Status: DC | PRN
  Administered 2018-12-21: 30 mL via ORAL
  Filled 2018-12-19: qty 30

## 2018-12-19 MED ORDER — TAMSULOSIN HCL 0.4 MG PO CAPS
0.4000 mg | ORAL_CAPSULE | Freq: Every day | ORAL | Status: DC
  Administered 2018-12-19 – 2018-12-20 (×2): 0.4 mg via ORAL
  Filled 2018-12-19 (×2): qty 1

## 2018-12-19 MED ORDER — SODIUM CHLORIDE 0.9 % IV SOLN
INTRAVENOUS | Status: DC | PRN
  Administered 2018-12-19: 25 ug/min via INTRAVENOUS

## 2018-12-19 MED ORDER — TRANEXAMIC ACID 1000 MG/10ML IV SOLN
INTRAVENOUS | Status: AC
  Filled 2018-12-19: qty 10

## 2018-12-19 MED ORDER — EPHEDRINE SULFATE 50 MG/ML IJ SOLN
INTRAMUSCULAR | Status: AC
  Filled 2018-12-19: qty 1

## 2018-12-19 MED ORDER — SODIUM CHLORIDE FLUSH 0.9 % IV SOLN
INTRAVENOUS | Status: AC
  Filled 2018-12-19: qty 40

## 2018-12-19 MED ORDER — GLYCOPYRROLATE 0.2 MG/ML IJ SOLN
INTRAMUSCULAR | Status: AC
  Filled 2018-12-19: qty 1

## 2018-12-19 MED ORDER — METOCLOPRAMIDE HCL 5 MG/ML IJ SOLN: 5.0000 mg | Freq: Three times a day (TID) | INTRAMUSCULAR | Status: DC | PRN

## 2018-12-19 MED ORDER — CEFAZOLIN SODIUM-DEXTROSE 2-4 GM/100ML-% IV SOLN
2.0000 g | Freq: Once | INTRAVENOUS | Status: AC
  Administered 2018-12-19: 2 g via INTRAVENOUS

## 2018-12-19 MED ORDER — PANTOPRAZOLE SODIUM 40 MG PO TBEC
40.0000 mg | DELAYED_RELEASE_TABLET | Freq: Every day | ORAL | Status: DC
  Administered 2018-12-20 – 2018-12-22 (×3): 40 mg via ORAL
  Filled 2018-12-19 (×3): qty 1

## 2018-12-19 MED ORDER — KETOROLAC TROMETHAMINE 15 MG/ML IJ SOLN
15.0000 mg | Freq: Once | INTRAMUSCULAR | Status: AC
  Administered 2018-12-19: 15 mg via INTRAVENOUS

## 2018-12-19 MED ORDER — ENOXAPARIN SODIUM 40 MG/0.4ML ~~LOC~~ SOLN
40.0000 mg | SUBCUTANEOUS | Status: DC
  Administered 2018-12-20 – 2018-12-22 (×3): 40 mg via SUBCUTANEOUS
  Filled 2018-12-19 (×3): qty 0.4

## 2018-12-19 MED ORDER — EPHEDRINE SULFATE 50 MG/ML IJ SOLN
INTRAMUSCULAR | Status: DC | PRN
  Administered 2018-12-19 (×2): 5 mg via INTRAVENOUS

## 2018-12-19 SURGICAL SUPPLY — 60 items
BANDAGE ELASTIC 6 LF NS (GAUZE/BANDAGES/DRESSINGS) ×3 IMPLANT
BEAR TIBIA 71/75W 10 THICK (Insert) ×3 IMPLANT
BEARING TIBIA 71/75W 10 THICK (Insert) ×1 IMPLANT
BLADE SAW SAG 25X90X1.19 (BLADE) ×3 IMPLANT
BLADE SURG SZ20 CARB STEEL (BLADE) ×3 IMPLANT
CANISTER SUCT 1200ML W/VALVE (MISCELLANEOUS) ×3 IMPLANT
CANISTER SUCT 3000ML PPV (MISCELLANEOUS) ×3 IMPLANT
CEMENT BONE R 1X40 (Cement) ×6 IMPLANT
CEMENT VACUUM MIXING SYSTEM (MISCELLANEOUS) ×3 IMPLANT
CHLORAPREP W/TINT 26 (MISCELLANEOUS) ×3 IMPLANT
COOLER POLAR GLACIER W/PUMP (MISCELLANEOUS) ×3 IMPLANT
COVER MAYO STAND REUSABLE (DRAPES) ×3 IMPLANT
COVER WAND RF STERILE (DRAPES) ×3 IMPLANT
CUFF TOURN SGL QUICK 30 (TOURNIQUET CUFF) ×2
CUFF TRNQT CYL 30X4X21-28X (TOURNIQUET CUFF) ×1 IMPLANT
DRAPE 3/4 80X56 (DRAPES) ×3 IMPLANT
DRAPE IMP U-DRAPE 54X76 (DRAPES) ×3 IMPLANT
DRSG OPSITE POSTOP 4X10 (GAUZE/BANDAGES/DRESSINGS) ×3 IMPLANT
DRSG OPSITE POSTOP 4X8 (GAUZE/BANDAGES/DRESSINGS) ×3 IMPLANT
ELECT CAUTERY BLADE 6.4 (BLADE) ×3 IMPLANT
ELECT REM PT RETURN 9FT ADLT (ELECTROSURGICAL) ×3
ELECTRODE REM PT RTRN 9FT ADLT (ELECTROSURGICAL) ×1 IMPLANT
FEMORAL CR LEFT  70MM (Joint) ×2 IMPLANT
FEMORAL CR LEFT 70MM (Joint) ×1 IMPLANT
GLOVE BIO SURGEON STRL SZ7.5 (GLOVE) ×12 IMPLANT
GLOVE BIO SURGEON STRL SZ8 (GLOVE) ×12 IMPLANT
GLOVE BIOGEL PI IND STRL 8 (GLOVE) ×1 IMPLANT
GLOVE BIOGEL PI INDICATOR 8 (GLOVE) ×2
GLOVE INDICATOR 8.0 STRL GRN (GLOVE) ×3 IMPLANT
GOWN STRL REUS W/ TWL LRG LVL3 (GOWN DISPOSABLE) ×1 IMPLANT
GOWN STRL REUS W/ TWL XL LVL3 (GOWN DISPOSABLE) ×1 IMPLANT
GOWN STRL REUS W/TWL LRG LVL3 (GOWN DISPOSABLE) ×2
GOWN STRL REUS W/TWL XL LVL3 (GOWN DISPOSABLE) ×2
HOLDER FOLEY CATH W/STRAP (MISCELLANEOUS) ×3 IMPLANT
HOOD PEEL AWAY FLYTE STAYCOOL (MISCELLANEOUS) ×9 IMPLANT
IMMBOLIZER KNEE 19 BLUE UNIV (SOFTGOODS) ×3 IMPLANT
KIT TURNOVER KIT A (KITS) ×3 IMPLANT
NDL SAFETY ECLIPSE 18X1.5 (NEEDLE) ×2 IMPLANT
NEEDLE HYPO 18GX1.5 SHARP (NEEDLE) ×4
NEEDLE SPNL 20GX3.5 QUINCKE YW (NEEDLE) ×3 IMPLANT
NS IRRIG 1000ML POUR BTL (IV SOLUTION) ×3 IMPLANT
PACK TOTAL KNEE (MISCELLANEOUS) ×3 IMPLANT
PAD WRAPON POLAR KNEE (MISCELLANEOUS) ×1 IMPLANT
PATELLA STD 34X8.5 (Orthopedic Implant) ×3 IMPLANT
PLATE KNEE TIBIAL 75MM FIXED (Plate) ×3 IMPLANT
PULSAVAC PLUS IRRIG FAN TIP (DISPOSABLE) ×3
SOL .9 NS 3000ML IRR  AL (IV SOLUTION) ×2
SOL .9 NS 3000ML IRR UROMATIC (IV SOLUTION) ×1 IMPLANT
STAPLER SKIN PROX 35W (STAPLE) ×3 IMPLANT
SUCTION FRAZIER HANDLE 10FR (MISCELLANEOUS) ×2
SUCTION TUBE FRAZIER 10FR DISP (MISCELLANEOUS) ×1 IMPLANT
SUT VIC AB 0 CT1 36 (SUTURE) ×9 IMPLANT
SUT VIC AB 2-0 CT1 27 (SUTURE) ×6
SUT VIC AB 2-0 CT1 TAPERPNT 27 (SUTURE) ×3 IMPLANT
SYR 10ML LL (SYRINGE) ×3 IMPLANT
SYR 20CC LL (SYRINGE) ×3 IMPLANT
SYR 30ML LL (SYRINGE) ×9 IMPLANT
TIP FAN IRRIG PULSAVAC PLUS (DISPOSABLE) ×1 IMPLANT
TRAY FOLEY MTR SLVR 16FR STAT (SET/KITS/TRAYS/PACK) ×3 IMPLANT
WRAPON POLAR PAD KNEE (MISCELLANEOUS) ×3

## 2018-12-19 NOTE — H&P (Signed)
Paper H&P to be scanned into permanent record. H&P reviewed and patient re-examined. No changes. 

## 2018-12-19 NOTE — Consult Note (Signed)
[place] at [place] NAME: Warren Ingram    MR#:  [id number]  DATE OF BIRTH:  11/24/1940  DATE OF ADMISSION:  12/19/2018  PRIMARY CARE PHYSICIAN: Idelle Crouch, MD   REQUESTING/REFERRING PHYSICIAN:John Poggi, MD  CHIEF COMPLAINT:  No chief complaint on file.   HISTORY OF PRESENT ILLNESS:  Warren Ingram  is a 78 y.o. male with a known history of hypertension, GERD, hyperlipidemia, hypothyroidism, and history of "mini stroke "approximately 4 years ago.  Hospitalist service has been asked to consult for medical management with uncontrolled hypertension postoperatively.  Patient does me he has been following up frequently with his primary care physician keeping daily logs of his blood pressure with blood pressure usually remaining 130s to 150s over 70s to 80s.  Currently patient is being treated with metoprolol and Avapro.  Despite receiving these medications today, patient continues to be significantly hypertensive with blood pressure ranging 1 80-200 over 90s to low 100s.  He received hydralazine 10 mg IV with no significant reduction in hypertension.  He is currently receiving p.o. medications including metoprolol and analgesic.  Patient reports pain score 8 out of 10 to his left knee postoperative site.  He denies headache.  He denies decreased vision or diplopia.  He denies chest pain or shortness of breath.  He remains afebrile.  No nausea or vomiting.  He denies abdominal pain.  We have added a one-time dose of labetalol 10 mg IV planning to start patient on hydralazine 25 mg p.o. 3 times daily.  We thank you for this consultation and we will continue to follow along with this patient during hospitalization until disposition home.  PAST MEDICAL HISTORY:   Past Medical History:  Diagnosis Date   Arthritis    Barrett esophagus    Benign neoplasm of abdomen    Benign neoplasm of colon    Cancer ([place])    skin cancer    Coronary artery disease    GERD (gastroesophageal reflux disease)    Hypercholesterolemia    Hypothyroidism    Stroke ([place]) 2015   mini stroke    PAST SURGICAL HISTOIRY:   Past Surgical History:  Procedure Laterality Date   CARDIAC CATHETERIZATION     COLONOSCOPY WITH PROPOFOL N/A 01/01/2018   Procedure: COLONOSCOPY WITH PROPOFOL;  Surgeon: Jonathon Bellows, MD;  Location: [facility] ENDOSCOPY;  Service: Gastroenterology;  Laterality: N/A;   CORONARY ARTERY BYPASS GRAFT  08/2017   4 vessels   ESOPHAGOGASTRODUODENOSCOPY (EGD) WITH PROPOFOL N/A 10/28/2015   Procedure: ESOPHAGOGASTRODUODENOSCOPY (EGD) WITH PROPOFOL;  Surgeon: Hulen Luster, MD;  Location: [facility] ENDOSCOPY;  Service: Gastroenterology;  Laterality: N/A;   KNEE ARTHROSCOPY     LEFT HEART CATH AND CORONARY ANGIOGRAPHY N/A 07/25/2017   Procedure: LEFT HEART CATH AND CORONARY ANGIOGRAPHY;  Surgeon: Teodoro Spray, MD;  Location: [place] CV LAB;  Service: Cardiovascular;  Laterality: N/A;   VASECTOMY      SOCIAL HISTORY:   Social History   Tobacco Use   Smoking status: Former Smoker    Packs/day: 1.00    Years: 15.00    Pack years: 15.00    Quit date: 06/06/1971    Years since quitting: 47.5   Smokeless tobacco: Former Systems developer  Substance Use Topics   Alcohol use: Yes    Alcohol/week: 1.0 standard drinks    Types: 1 Cans of beer per week    Comment: 4 to 5 times per week    FAMILY  HISTORY:   Family History  Problem Relation Age of Onset   Heart attack Mother    Lung disease Father     DRUG ALLERGIES:  No Known Allergies  REVIEW OF SYSTEMS:  CONSTITUTIONAL: No fever, fatigue or weakness.  EYES: No blurred or double vision.  EARS, NOSE, AND THROAT: No tinnitus or ear pain.  RESPIRATORY: No cough, shortness of breath, wheezing or hemoptysis.  CARDIOVASCULAR: No chest pain, orthopnea, edema.  GASTROINTESTINAL: No nausea, vomiting, diarrhea or abdominal pain.  GENITOURINARY: No dysuria, hematuria.    ENDOCRINE: No polyuria, nocturia,  HEMATOLOGY: No anemia, easy bruising or bleeding SKIN: No rash or lesion. Left knee immobilizer in place MUSCULOSKELETAL: No joint pain or arthritis.   NEUROLOGIC: No tingling, numbness, weakness.  PSYCHIATRY: No anxiety or depression.   MEDICATIONS AT HOME:   Prior to Admission medications   Medication Sig Start Date End Date Taking? Authorizing Provider  acetaminophen (TYLENOL) 500 MG tablet Take 500 mg by mouth at bedtime as needed for moderate pain or headache.   Yes [provider]  aspirin (ASPIRIN EC) 81 MG EC tablet Take 81 mg by mouth daily. Swallow whole.   Yes [provider]  atorvastatin (LIPITOR) 80 MG tablet Take 80 mg by mouth at bedtime.    Yes [provider]  fluticasone (FLONASE) 50 MCG/ACT nasal spray Place 1 spray into both nostrils 2 (two) times daily as needed.    Yes [provider]  levothyroxine (SYNTHROID, LEVOTHROID) 150 MCG tablet Take 150 mcg by mouth daily before breakfast.   Yes [provider]  meloxicam (MOBIC) 15 MG tablet Take 15 mg by mouth as needed.    Yes [provider]  metoprolol tartrate (LOPRESSOR) 25 MG tablet Take 25 mg by mouth 2 (two) times daily.  09/03/17  Yes [provider]  Omega-3 Fatty Acids (FISH OIL PO) Take 1 capsule by mouth 2 (two) times a day.    Yes [provider]  omeprazole (PRILOSEC) 20 MG capsule Take 20 mg by mouth every morning.    Yes [provider]  tamsulosin (FLOMAX) 0.4 MG CAPS capsule Take 0.4 mg by mouth at bedtime.  09/03/17  Yes [provider]  telmisartan (MICARDIS) 40 MG tablet Take 40 mg by mouth every morning.    Yes [provider]      VITAL SIGNS:  Blood pressure (!) 196/80, pulse 70, temperature 98.3 F (36.8 C), resp. rate 16, height 5\' 6"  (1.676 m), weight 83.9 kg, SpO2 99 %.  PHYSICAL EXAMINATION:  GENERAL:  78 y.o.-year-old patient lying in the bed with no acute  distress.  EYES: Pupils equal, round, reactive to light and accommodation. No scleral icterus. Extraocular muscles intact.  HEENT: Head atraumatic, normocephalic. Oropharynx and nasopharynx clear.  NECK:  Supple, no jugular venous distention. No thyroid enlargement, no tenderness.  LUNGS: Normal breath sounds bilaterally, no wheezing, rales,rhonchi or crepitation. No use of accessory muscles of respiration.  CARDIOVASCULAR: S1, S2 normal. No murmurs, rubs, or gallops.  ABDOMEN: Soft, nontender, nondistended. Bowel sounds present. No organomegaly or mass.  EXTREMITIES: No pedal edema, cyanosis, or clubbing. Left knee immobilized NEUROLOGIC: Cranial nerves II through XII are intact. Muscle strength 5/5 in all extremities. Sensation intact. Gait not checked.  PSYCHIATRIC: The patient is alert and oriented x 3.  SKIN: No obvious rash, lesion, or ulcer.  LABORATORY PANEL:   CBC No results for input(s): WBC, HGB, HCT, PLT in the last 168 hours. ------------------------------------------------------------------------------------------------------------------  Chemistries  No results for input(s): NA, K, CL, CO2, GLUCOSE, BUN, CREATININE, CALCIUM, MG, AST, ALT, ALKPHOS, BILITOT in the last 168 hours.  Invalid input(s): GFRCGP ------------------------------------------------------------------------------------------------------------------  Cardiac Enzymes No results for input(s): TROPONINI in the last 168 hours. ------------------------------------------------------------------------------------------------------------------  RADIOLOGY:  Dg Knee Left Port  Result Date: 12/19/2018 CLINICAL DATA:  Postop day 0 LEFT total knee arthroplasty. EXAM: PORTABLE LEFT KNEE - 1-2 VIEW COMPARISON:  None. FINDINGS: Anatomic alignment post LEFT total knee arthroplasty. No acute complicating features. Air-fluid level in the joint space as expected immediately postoperatively. IMPRESSION: Anatomic alignment  post LEFT total knee arthroplasty without acute complicating features. Electronically Signed   By: Evangeline Dakin M.D.   On: 12/19/2018 14:44    EKG:   Orders placed or performed during the hospital encounter of 12/12/18   EKG 12-Lead   EKG 12-Lead    IMPRESSION AND PLAN:   1.  Uncontrolled hypertension - Continue current metoprolol 25 mg p.o. twice daily and Avapro - Patient is receiving hydralazine 10 mg IV every 6 hours as needed for systolic blood pressure greater than 180 - He is receiving one-time dose of labetalol 10 mg IV now dose - We will add hydralazine 25 mg p.o. 3 times daily and continue to monitor blood pressure and heart rate closely -Patient is on telemetry monitoring -We will get BMP in the a.m. --May be in part secondary to acute postoperative pain-treated with analgesic  2.  Acute postop pain secondary to left total knee replacement - Pain is being managed with analgesic  3. GERD -PPI therapy continued  4.  Hyperlipidemia - Statin therapy continued  We will continue to follow along with this patient until dispositioned home.    All the records are reviewed and case discussed with Consulting provider. Management plans discussed with the patient, family and they are in agreement.  CODE STATUS: Full code  TOTAL TIME TAKING CARE OF THIS PATIENT: 45 minutes.    [place] on 12/19/2018 at 9:56 PM  Between 7am to 6pm - Pager - [phone]  After 6pm go to [email or web address] - Proofreader  [facility]  Office  [phone]  CC: Primary care Physician: Idelle Crouch, MD   Note: This dictation was prepared with Dragon dictation along with smaller phrase technology. Any transcriptional errors that result from this process are unintentional.

## 2018-12-19 NOTE — Anesthesia Post-op Follow-up Note (Signed)
Anesthesia QCDR form completed.        

## 2018-12-19 NOTE — Anesthesia Procedure Notes (Signed)
Performed by: Sruthi Maurer, CRNA Pre-anesthesia Checklist: Patient identified, Emergency Drugs available, Suction available, Patient being monitored and Timeout performed Patient Re-evaluated:Patient Re-evaluated prior to induction Oxygen Delivery Method: Simple face mask Induction Type: IV induction       

## 2018-12-19 NOTE — Progress Notes (Signed)
Pts BP elevated, orders received for IV Hydralazine 10 mg Q6 PRN for SBP > 180. Order placed

## 2018-12-19 NOTE — Anesthesia Postprocedure Evaluation (Signed)
Anesthesia Post Note  Patient: Warren Ingram  Procedure(s) Performed: TOTAL KNEE ARTHROPLASTY (Left )  Patient location during evaluation: PACU Anesthesia Type: Spinal Level of consciousness: awake and alert Pain management: pain level controlled Vital Signs Assessment: post-procedure vital signs reviewed and stable Respiratory status: spontaneous breathing, nonlabored ventilation and respiratory function stable Cardiovascular status: blood pressure returned to baseline and stable Postop Assessment: no apparent nausea or vomiting Anesthetic complications: no     Last Vitals:  Vitals:   12/19/18 1347 12/19/18 1432  BP: 140/70 130/65  Pulse: (!) 56 60  Resp: 16 18  Temp: (!) 36.4 C 37 C  SpO2: 98% 100%    Last Pain:  Vitals:   12/19/18 1432  TempSrc: Oral  PainSc:                  Alphonsus Sias

## 2018-12-19 NOTE — TOC Initial Note (Signed)
Transition of Care [facility]) - Initial/Assessment Note    Patient Details  Name: Warren Ingram MRN: [id number] Date of Birth: 11/04/1940  [facility] of [facility]) CM/SW Contact:    Su Hilt, RN Phone Number: 12/19/2018, 3:15 PM  Clinical Narrative:                 Met with the patient to discuss DC plan and needs He lives at home with his wife and has DME at home has no additional needs for [facility] He does not want to use HH and prefers to go to outpatient as he has been at [facility] clinic Out patient PT Dr Doy Hutching is his PCP and he is up to date He uses [facility] and can afford his medication He takes aspirin in the morning and fish oil  Continue to monitor for additional needs  Expected Discharge Plan: [place] Barriers to Discharge: Continued Medical Work up   Patient Goals and CMS Choice Patient states their goals for this hospitalization and ongoing recovery are:: go home CMS Medicare.gov Compare Post Acute Care list provided to:: Patient Choice offered to / list presented to : Patient  Expected Discharge Plan and Services Expected Discharge Plan: [place]   Discharge Planning Services: CM Consult Post Acute Care Choice: [place] arrangements for the past 2 months: Single Family Home Expected Discharge Date: 12/20/18               DME Arranged: N/A         HH Arranged: Refused HH(Wants to continue with outpatient PT at [facility] clinic)          Prior Living Arrangements/Services Living arrangements for the past 2 months: Single Family Home Lives with:: Spouse Patient language and need for interpreter reviewed:: No Do you feel safe going back to the place where you live?: Yes      Need for Family Participation in Patient Care: No (Comment) Care giver support system in place?: Yes (comment) Current home services: DME(RW, [employer] shower chair) Criminal Activity/Legal Involvement Pertinent to Current  Situation/Hospitalization: No - Comment as needed  Activities of Daily Living Home Assistive Devices/Equipment: None ADL Screening (condition at time of admission) Patient's cognitive ability adequate to safely complete daily activities?: Yes Is the patient deaf or have difficulty hearing?: No Does the patient have difficulty seeing, even when wearing glasses/contacts?: No Does the patient have difficulty concentrating, remembering, or making decisions?: No Patient able to express need for assistance with ADLs?: Yes Does the patient have difficulty dressing or bathing?: No Independently performs ADLs?: Yes (appropriate for developmental age) Does the patient have difficulty walking or climbing stairs?: No Weakness of Legs: None Weakness of Arms/Hands: None  Permission Sought/Granted Permission sought to share information with : Case Manager                Emotional Assessment Appearance:: Appears stated age Attitude/Demeanor/Rapport: Engaged Affect (typically observed): Accepting, Pleasant, Happy, Appropriate Orientation: : Oriented to Self, Oriented to Place, Oriented to  Time, Oriented to Situation Alcohol / Substance Use: Other (comment) Psych Involvement: No (comment)  Admission diagnosis:  Status post total knee replacement using cement, left [Z96.652] Patient Active Problem List   Diagnosis Date Noted  . 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 Hematochezia 12/30/2017  . Hypothyroidism 12/30/2017  . 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  Benign neoplasm of colon 08/21/2013  . CAD in native artery 08/21/2013  . Diverticulitis of colon 08/21/2013  . GERD (gastroesophageal reflux disease) 08/21/2013  . Benign essential HTN 08/21/2013  . Anal bleeding 08/21/2013  . Arthritis, degenerative 08/21/2013  . 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 Pure hypercholesterolemia  08/21/2013  . Adenomatous colon polyp 08/21/2013  . Osteoarthrosis, unspecified whether generalized or localized, pelvic region and thigh 08/21/2013   PCP:  Idelle Crouch, MD Pharmacy:   [facility] [place], [place] [facility] [address] AT [address] [place] [facility] [place] [address] Phone: [phone] Fax: [phone]     Social Determinants of Health (SDOH) Interventions    Readmission Risk Interventions No flowsheet data found.

## 2018-12-19 NOTE — Progress Notes (Signed)
PT Cancellation Note  Patient Details Name: Warren Ingram MRN: [id number] DOB: January 15, 1941   Cancelled Treatment:    Reason Eval/Treat Not Completed: Patient declined, no reason specified Chart reviewed, entered pt's room for PT eval.  Pt in a lot of pain and pleasantly asks not to start PT until tomorrow.  Reports that he has typically been very able to tolerate pain (open heart surgery, etc) but that he is too uncomfortable at this time.  Pt also with elevated BP, placed towel roll under heel for improved positioning and encouraged polar care use (he had taken this off secondary to discomfort).  Will initiate PT eval tomorrow. Kreg Shropshire , DPT 12/19/2018, 5:13 PM

## 2018-12-19 NOTE — Transfer of Care (Signed)
Immediate Anesthesia Transfer of Care Note  Patient: JEFFIE SPIVACK  Procedure(s) Performed: TOTAL KNEE ARTHROPLASTY (Left )  Patient Location: PACU  Anesthesia Type:Spinal  Level of Consciousness: awake and alert   Airway & Oxygen Therapy: Patient Spontanous Breathing and Patient connected to face mask oxygen  Post-op Assessment: Report given to RN and Post -op Vital signs reviewed and stable  Post vital signs: Reviewed and stable  Last Vitals:  Vitals Value Taken Time  BP 141/56 12/19/18 1227  Temp    Pulse 76 12/19/18 1229  Resp 13 12/19/18 1229  SpO2 100 % 12/19/18 1229  Vitals shown include unvalidated device data.  Last Pain:  Vitals:   12/19/18 0808  TempSrc: Temporal  PainSc: 5       Patients Stated Pain Goal: 3 (51/83/43 7357)  Complications: No apparent anesthesia complications

## 2018-12-19 NOTE — Op Note (Signed)
12/19/2018  12:17 PM  Patient:   Warren Ingram  Pre-Op Diagnosis:   Degenerative joint disease, left knee.  Post-Op Diagnosis:   Same  Procedure:   Left TKA using all-cemented [employer] Vanguard system with a 70 mm PCR femur, a 75 mm tibial tray with a 10 mm E-poly insert, and a 34 x 8.5 mm all-poly 3-pegged domed patella.  Surgeon:   Pascal Lux, MD  Assistant:   Cameron Proud, PA-C   Anesthesia:   Spinal  Findings:   As above  Complications:   None  EBL:   10 cc  Fluids:   1100 cc crystalloid  UOP:   325 cc  TT:   96 minutes at 300 mmHg  Drains:   None  Closure:   Staples  Implants:   As above  Brief Clinical Note:   The patient is a 78 year old male with a long history of progressively worsening left knee pain. The patient's symptoms have progressed despite medications, activity modification, injections, etc. The patient's history and examination were consistent with advanced degenerative joint disease of the right knee confirmed by plain radiographs. The patient presents at this time for a left total knee arthroplasty.  Procedure:   The patient was brought into the operating room. After adequate spinal anesthesia was obtained, the patient was lain in the supine position. A Foley catheter was placed by the nurse before the right lower extremity was prepped with ChloraPrep solution and draped sterilely. Preoperative antibiotics were administered. After verifying the proper laterality with a surgical timeout, the limb was exsanguinated with an Esmarch and the tourniquet inflated to 300 mmHg. A standard anterior approach to the knee was made through an approximately 7 inch incision. The incision was carried down through the subcutaneous tissues to expose superficial retinaculum. This was split the length of the incision and the medial flap elevated sufficiently to expose the medial retinaculum. The medial retinaculum was incised, leaving a 3-4 mm cuff of tissue on the  patella. This was extended distally along the medial border of the patellar tendon and proximally through the medial third of the quadriceps tendon. A subtotal fat pad excision was performed before the soft tissues were elevated off the anteromedial and anterolateral aspects of the proximal tibia to the level of the collateral ligaments. The anterior portions of the medial and lateral menisci were removed, as was the anterior cruciate ligament. With the knee flexed to 90, the external tibial guide was positioned and the appropriate proximal tibial cut made. This piece was taken to the back table where it was measured and found to be optimally replicated by a 75 mm component.  Attention was directed to the distal femur. The intramedullary canal was accessed through a 3/8" drill hole. The intramedullary guide was inserted and positioned in order to obtain a neutral flexion gap. The intercondylar block was positioned with care taken to avoid notching the anterior cortex of the femur. The appropriate cut was made. Next, the distal cutting block was placed at 6 of valgus alignment. Using the 9 mm slot, the distal cut was made. The distal femur was measured and found to be optimally replicated by the 70 mm component. The 70 mm 4-in-1 cutting block was positioned and first the posterior, then the posterior chamfer, the anterior chamfer, and finally the anterior cuts were made. At this point, the posterior portions medial and lateral menisci were removed. A trial reduction was performed using the appropriate femoral and tibial components with the 10  mm insert. This demonstrated excellent stability to varus and valgus stressing both in flexion and extension while permitting full extension. Patella tracking was assessed and found to be excellent. Therefore, the tibial guide position was marked on the proximal tibia. The patella thickness was measured and found to be 28 mm. Therefore, the appropriate cut was made. The  patellar surface was measured and found to be optimally replicated by the 34 mm component. The three peg holes were drilled in place before the trial button was inserted. Patella tracking was assessed and found to be excellent, passing the "no thumb test". The lug holes were drilled into the distal femur before the trial component was removed, leaving only the tibial tray. The keel was then created using the appropriate tower, reamer, and punch.  The bony surfaces were prepared for cementing by irrigating them thoroughly with bacitracin saline solution via the jet lavage system. A bone plug was fashioned from some of the bone that had been removed previously and used to plug the distal femoral canal. In addition, 20 cc of Exparel diluted out to 60 cc with normal saline and 30 cc of 0.5% Sensorcaine were injected into the postero-medial and postero-lateral aspects of the knee, the medial and lateral gutter regions, and the peri-incisional tissues to help with postoperative analgesia. Meanwhile, the cement was being mixed on the back table. When it was ready, the tibial tray was cemented in first. The excess cement was removed using [employer] fast streamer. Next, the femoral component was impacted into place. Again, the excess cement was removed using [employer] fast streamer. The 10 mm trial insert was positioned and the knee brought into extension while the cement hardened. Finally, the patella was cemented into place and secured using the patellar clamp. Again, the excess cement was removed using [employer] fast streamer. Once the cement had hardened, the knee was placed through a range of motion with the findings as described above. Therefore, the trial insert was removed and, after verifying that no cement had been retained posteriorly, the permanent insert was positioned and secured using the appropriate key locking mechanism. Again the knee was placed through a range of motion with the findings as described above.  The wound was  copiously irrigated with bacitracin saline solution using the jet lavage system before the quadriceps tendon and retinacular layer were reapproximated using #0 Vicryl interrupted sutures. The superficial retinacular layer also was closed using a running #0 Vicryl suture. A total of 10 cc of transexemic acid (TXA) was injected intra-articularly before the subcutaneous tissues were closed in several layers using 2-0 Vicryl interrupted sutures. The skin was closed using staples. A sterile honeycomb dressing was applied to the skin before the leg was wrapped with an Ace wrap to accommodate the Polar Care device. The patient was then awakened and returned to the recovery room in satisfactory condition after tolerating the procedure well.

## 2018-12-19 NOTE — Anesthesia Preprocedure Evaluation (Signed)
Anesthesia Evaluation  Patient identified by MRN, date of birth, ID band Patient awake    Reviewed: Allergy & Precautions, H&P , NPO status , reviewed documented beta blocker date and time   Airway Mallampati: III  TM Distance: >3 FB Neck ROM: limited    Dental  (+) Missing, Caps, Partial Upper, Implants   Pulmonary former smoker,    Pulmonary exam normal        Cardiovascular hypertension, + CAD, + Cardiac Stents and + CABG  Normal cardiovascular exam  S/P CABG, Stable, cardiac clearance noted   Neuro/Psych  Headaches, CVA    GI/Hepatic GERD  Medicated and Controlled,  Endo/Other  Hypothyroidism   Renal/GU      Musculoskeletal  (+) Arthritis ,   Abdominal   Peds  Hematology   Anesthesia Other Findings Past Medical History: No date: Arthritis 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 No date: Benign neoplasm of abdomen No date: Benign neoplasm of colon No date: Cancer (HCC)     Comment:  skin cancer No date: Coronary artery disease No date: GERD (gastroesophageal reflux disease) No date: Hypercholesterolemia No date: Hypothyroidism 2015: Stroke [facility])     Comment:  mini stroke  Past Surgical History: No date: CARDIAC CATHETERIZATION 01/01/2018: COLONOSCOPY WITH PROPOFOL; N/A     Comment:  Procedure: COLONOSCOPY WITH PROPOFOL;  Surgeon: Jonathon Bellows, MD;  Location[facility] ENDOSCOPY;  Service:               Gastroenterology;  Laterality: N/A; 08/2017: CORONARY ARTERY BYPASS GRAFT     Comment:  4 vessels 10/28/2015: ESOPHAGOGASTRODUODENOSCOPY (EGD) WITH PROPOFOL; N/A     Comment:  Procedure: ESOPHAGOGASTRODUODENOSCOPY (EGD) WITH               PROPOFOL;  Surgeon: Hulen Luster, MD;  Location: [facility]               ENDOSCOPY;  Service: Gastroenterology;  Laterality: N/A; No date: KNEE ARTHROSCOPY 07/25/2017: LEFT HEART CATH AND CORONARY ANGIOGRAPHY; N/A     Comment:  Procedure: LEFT HEART CATH AND CORONARY ANGIOGRAPHY;                 Surgeon: Teodoro Spray, MD;  Location: [place] CV              LAB;  Service: Cardiovascular;  Laterality: N/A; No date: VASECTOMY  BMI    Body Mass Index: 29.86 kg/m      Reproductive/Obstetrics                             Anesthesia Physical Anesthesia Plan  ASA: III  Anesthesia Plan: Spinal   Post-op Pain Management:    Induction: Intravenous  PONV Risk Score and Plan: 2 and Treatment may vary due to age or medical condition, TIVA, Ondansetron and Midazolam  Airway Management Planned: Nasal Cannula and Natural Airway  Additional Equipment:   Intra-op Plan:   Post-operative Plan:   Informed Consent: I have reviewed the patients History and Physical, chart, labs and discussed the procedure including the risks, benefits and alternatives for the proposed anesthesia with the patient or authorized representative who has indicated his/her understanding and acceptance.     Dental Advisory Given  Plan Discussed with: CRNA  Anesthesia Plan Comments:         Anesthesia Quick Evaluation

## 2018-12-19 NOTE — Anesthesia Procedure Notes (Signed)
Spinal  Patient location during procedure: OR Start time: 12/19/2018 9:58 AM End time: 12/19/2018 10:05 AM Staffing Anesthesiologist: Alphonsus Sias, MD Resident/CRNA: Demetrius Charity, CRNA Performed: anesthesiologist and resident/CRNA  Preanesthetic Checklist Completed: patient identified, site marked, surgical consent, pre-op evaluation, timeout performed, IV checked, risks and benefits discussed and monitors and equipment checked Spinal Block Patient position: sitting Prep: ChloraPrep Patient monitoring: heart rate, cardiac monitor, continuous pulse ox and blood pressure Approach: right paramedian Location: L3-4 Injection technique: single-shot Needle Needle type: Pencan  Needle gauge: 24 G Needle length: 9 cm Needle insertion depth: 7 cm Assessment Events: paresthesia Additional Notes Initial attempt midline, move to paramedian. Mild L paraesthesia, resolved immediately. Some heme, cleared after 1cc. No pain on injection. Tol well

## 2018-12-19 NOTE — Care Management CC44 (Signed)
Condition Code 44 Documentation Completed  Patient Details  Name: Warren Ingram MRN: [id number] Date of Birth: 1941-03-07   Condition Code 44 given:  Yes Patient signature on Condition Code 44 notice:  Yes Documentation of 2 MD's agreement:  Yes Code 44 added to claim:  Yes    Su Hilt, RN 12/19/2018, 3:18 PM

## 2018-12-20 ENCOUNTER — Encounter: Payer: Self-pay | Admitting: Surgery

## 2018-12-20 DIAGNOSIS — M1712 Unilateral primary osteoarthritis, left knee: Secondary | ICD-10-CM | POA: Diagnosis not present

## 2018-12-20 LAB — CBC WITH DIFFERENTIAL/PLATELET
Abs Immature Granulocytes: 0.05 10*3/uL (ref 0.00–0.07)
Basophils Absolute: 0 10*3/uL (ref 0.0–0.1)
Basophils Relative: 0 %
Eosinophils Absolute: 0.1 10*3/uL (ref 0.0–0.5)
Eosinophils Relative: 2 %
HCT: 35 % — ABNORMAL LOW (ref 39.0–52.0)
Hemoglobin: 11 g/dL — ABNORMAL LOW (ref 13.0–17.0)
Immature Granulocytes: 1 %
Lymphocytes Relative: 16 %
Lymphs Abs: 1.4 10*3/uL (ref 0.7–4.0)
MCH: 26.1 pg (ref 26.0–34.0)
MCHC: 31.4 g/dL (ref 30.0–36.0)
MCV: 83.1 fL (ref 80.0–100.0)
Monocytes Absolute: 0.7 10*3/uL (ref 0.1–1.0)
Monocytes Relative: 8 %
Neutro Abs: 6.5 10*3/uL (ref 1.7–7.7)
Neutrophils Relative %: 73 %
Platelets: 175 10*3/uL (ref 150–400)
RBC: 4.21 MIL/uL — ABNORMAL LOW (ref 4.22–5.81)
RDW: 15.4 % (ref 11.5–15.5)
WBC: 8.7 10*3/uL (ref 4.0–10.5)
nRBC: 0 % (ref 0.0–0.2)

## 2018-12-20 LAB — BASIC METABOLIC PANEL
Anion gap: 11 (ref 5–15)
BUN: 18 mg/dL (ref 8–23)
CO2: 25 mmol/L (ref 22–32)
Calcium: 8.2 mg/dL — ABNORMAL LOW (ref 8.9–10.3)
Chloride: 99 mmol/L (ref 98–111)
Creatinine, Ser: 0.9 mg/dL (ref 0.61–1.24)
GFR calc Af Amer: >60 mL/min (ref >60)
GFR calc non Af Amer: >60 mL/min (ref >60)
Glucose, Bld: 131 mg/dL — ABNORMAL HIGH (ref 70–99)
Potassium: 4 mmol/L (ref 3.5–5.1)
Sodium: 135 mmol/L (ref 135–145)

## 2018-12-20 MED ORDER — HYDRALAZINE HCL 25 MG PO TABS: 25.0000 mg | ORAL_TABLET | Freq: Three times a day (TID) | ORAL | 0 refills | Status: DC

## 2018-12-20 MED ORDER — TRAMADOL HCL 50 MG PO TABS: 50.0000 mg | ORAL_TABLET | Freq: Four times a day (QID) | ORAL | 0 refills | Status: DC | PRN

## 2018-12-20 MED ORDER — HYDRALAZINE HCL 50 MG PO TABS
50.0000 mg | ORAL_TABLET | Freq: Three times a day (TID) | ORAL | Status: DC
  Administered 2018-12-20 – 2018-12-22 (×6): 50 mg via ORAL
  Filled 2018-12-20 (×6): qty 1

## 2018-12-20 MED ORDER — OXYCODONE HCL 5 MG PO TABS: 5.0000 mg | ORAL_TABLET | ORAL | 0 refills | Status: DC | PRN

## 2018-12-20 MED ORDER — ENOXAPARIN SODIUM 40 MG/0.4ML ~~LOC~~ SOLN: 40.0000 mg | SUBCUTANEOUS | 0 refills | Status: DC

## 2018-12-20 NOTE — Progress Notes (Signed)
Pt complained of frequent urination with very little out put. Bladder scan done, 970 MLs obtained. Dr. Rudene Christians made aware. Order to insert foley catheter and dc foley at 8 in the morning obtained.

## 2018-12-20 NOTE — Progress Notes (Signed)
[place] at [facility]                                                                                                                                                                                  Patient Demographics   Warren Ingram, is a 78 y.o. male, DOB - 1940/07/26, ZOX:[id number]  Admit date - 12/19/2018   Admitting Physician Corky Mull, MD  Outpatient Primary MD for the patient is Sparks, Leonie Douglas, MD   LOS - 1  Subjective:     Review of Systems:   CONSTITUTIONAL: No documented fever. No fatigue, weakness. No weight gain, no weight loss.  EYES: No blurry or double vision.  ENT: No tinnitus. No postnasal drip. No redness of the oropharynx.  RESPIRATORY: No cough, no wheeze, no hemoptysis. No dyspnea.  CARDIOVASCULAR: No chest pain. No orthopnea. No palpitations. No syncope.  GASTROINTESTINAL: No nausea, no vomiting or diarrhea. No abdominal pain. No melena or hematochezia.  GENITOURINARY: No dysuria or hematuria.  ENDOCRINE: No polyuria or nocturia. No heat or cold intolerance.  HEMATOLOGY: No anemia. No bruising. No bleeding.  INTEGUMENTARY: No rashes. No lesions.  MUSCULOSKELETAL: No arthritis. No swelling. No gout.  NEUROLOGIC: No numbness, tingling, or ataxia. No seizure-type activity.  PSYCHIATRIC: No anxiety. No insomnia. No ADD.    Vitals:   Vitals:   12/19/18 2342 12/20/18 0424 12/20/18 0743 12/20/18 1155  BP: (!) 173/77 (!) 164/75 (!) 155/69 (!) 177/83  Pulse: 64 61 63 64  Resp: 18 19    Temp: 98.5 F (36.9 C) 97.9 F (36.6 C) 98.2 F (36.8 C) 98.3 F (36.8 C)  TempSrc:  Oral    SpO2: 99% 99% 98% 98%  Weight:      Height:        Wt Readings from Last 3 Encounters:  12/19/18 83.9 kg  01/10/18 82.2 kg  12/30/17 81.6 kg     Intake/Output Summary (Last 24 hours) at 12/20/2018 1208 Last data filed at 12/20/2018 0933 Gross per 24 hour  Intake 960 ml  Output 640 ml  Net 320 ml    Physical Exam:   GENERAL:  Pleasant-appearing in no apparent distress.  HEAD, EYES, EARS, NOSE AND THROAT: Atraumatic, normocephalic. Extraocular muscles are intact. Pupils equal and reactive to light. Sclerae anicteric. No conjunctival injection. No oro-pharyngeal erythema.  NECK: Supple. There is no jugular venous distention. No bruits, no lymphadenopathy, no thyromegaly.  HEART: Regular rate and rhythm,. No murmurs, no rubs, no clicks.  LUNGS: Clear to auscultation bilaterally. No rales or rhonchi. No wheezes.  ABDOMEN: Soft, flat, nontender, nondistended. Has good bowel sounds. No hepatosplenomegaly appreciated.  EXTREMITIES: No evidence of  any cyanosis, clubbing, or peripheral edema.  +2 pedal and radial pulses bilaterally.  NEUROLOGIC: The patient is alert, awake, and oriented x3 with no focal motor or sensory deficits appreciated bilaterally.  SKIN: Moist and warm with no rashes appreciated.  Psych: Not anxious, depressed LN: No inguinal LN enlargement    Antibiotics   Anti-infectives (From admission, onward)   Start     Dose/Rate Route Frequency Ordered Stop   12/19/18 1600  ceFAZolin (ANCEF) IVPB 2g/100 mL premix     2 g 200 mL/hr over 30 Minutes Intravenous Every 6 hours 12/19/18 1347 12/20/18 0440   12/19/18 0811  ceFAZolin (ANCEF) 2-4 GM/100ML-% IVPB    Note to Pharmacy: Norton Blizzard  : cabinet override      12/19/18 0811 12/19/18 1012   12/19/18 0615  ceFAZolin (ANCEF) IVPB 2g/100 mL premix     2 g 200 mL/hr over 30 Minutes Intravenous  Once 12/19/18 0601 12/19/18 1027      Medications   Scheduled Meds: . aspirin EC  81 mg Oral Daily  . atorvastatin  80 mg Oral QHS  . docusate sodium  100 mg Oral BID  . enoxaparin (LOVENOX) injection  40 mg Subcutaneous Q24H  . hydrALAZINE  50 mg Oral Q8H  . irbesartan  150 mg Oral Daily  . ketorolac  7.5 mg Intravenous Q6H  . levothyroxine  150 mcg Oral QAC breakfast  . metoprolol tartrate  25 mg Oral BID  . omega-3 acid ethyl esters  1 g Oral BID   . pantoprazole  40 mg Oral Daily  . tamsulosin  0.4 mg Oral QHS   Continuous Infusions: . sodium chloride 75 mL/hr at 12/19/18 1439   PRN Meds:.acetaminophen, bisacodyl, diphenhydrAMINE, fluticasone, hydrALAZINE, HYDROmorphone (DILAUDID) injection, magnesium hydroxide, metoCLOPramide **OR** metoCLOPramide (REGLAN) injection, ondansetron **OR** ondansetron (ZOFRAN) IV, oxyCODONE, sodium phosphate, traMADol   Data Review:   Micro Results Recent Results (from the past 240 hour(s))  Surgical pcr screen     Status: None   Collection Time: 12/12/18  1:42 PM   Specimen: Nasal Mucosa; Nasal Swab  Result Value Ref Range Status   MRSA, PCR NEGATIVE NEGATIVE Final   Staphylococcus aureus NEGATIVE NEGATIVE Final    Comment: (NOTE) The Xpert SA Assay (FDA approved for NASAL specimens in patients 28 years of age and older), is one component of a comprehensive surveillance program. It is not intended to diagnose infection nor to guide or monitor treatment. Performed at [facility], [address] [place], [place] [address]   Urine culture     Status: None   Collection Time: 12/12/18  2:07 PM   Specimen: Urine, Random  Result Value Ref Range Status   Specimen Description   Final    URINE, RANDOM Performed at [facility], [address] [place], [place] [address]    Special Requests   Final    NONE Performed at [facility], [address], [place], [place] [address]    Culture   Final    NO GROWTH Performed at [facility], [place] [address] [place], [place] [address]    Report Status 12/13/2018 FINAL  Final  SARS Coronavirus 2 (Performed in [place] hospital lab)     Status: None   Collection Time: 12/16/18 10:00 AM   Specimen: Nasal Swab  Result Value Ref Range Status   SARS Coronavirus 2 NEGATIVE NEGATIVE Final    Comment: (NOTE) SARS-CoV-2 target nucleic acids are NOT DETECTED. The SARS-CoV-2 RNA  is generally detectable in upper and  lower respiratory specimens during the acute phase of infection. Negative results do not preclude SARS-CoV-2 infection, do not rule out co-infections with other pathogens, and should not be used as the sole basis for treatment or other patient management decisions. Negative results must be combined with clinical observations, patient history, and epidemiological information. The expected result is Negative. Fact Sheet for Patients: SugarRoll.be Fact Sheet for Healthcare Providers: https://www.woods-mathews.com/ This test is not yet approved or cleared by the [place] FDA and  has been authorized for detection and/or diagnosis of SARS-CoV-2 by FDA under an Emergency Use Authorization (EUA). This EUA will remain  in effect (meaning this test can be used) for the duration of the COVID-19 declaration under Section 56 4(b)(1) of the Act, 21 U.S.C. section 360bbb-3(b)(1), unless the authorization is terminated or revoked sooner. Performed at [facility], [place] [address], [place], [place] [address]     Radiology Reports Dg Knee Left Port  Result Date: 12/19/2018 CLINICAL DATA:  Postop day 0 LEFT total knee arthroplasty. EXAM: PORTABLE LEFT KNEE - 1-2 VIEW COMPARISON:  None. FINDINGS: Anatomic alignment post LEFT total knee arthroplasty. No acute complicating features. Air-fluid level in the joint space as expected immediately postoperatively. IMPRESSION: Anatomic alignment post LEFT total knee arthroplasty without acute complicating features. Electronically Signed   By: Evangeline Dakin M.D.   On: 12/19/2018 14:44     CBC Recent Labs  Lab 12/20/18 0420  WBC 8.7  HGB 11.0*  HCT 35.0*  PLT 175  MCV 83.1  MCH 26.1  MCHC 31.4  RDW 15.4  LYMPHSABS 1.4  MONOABS 0.7  EOSABS 0.1  BASOSABS 0.0    Chemistries  Recent Labs  Lab 12/20/18 0420  NA 135  K 4.0  CL 99  CO2 25  GLUCOSE 131*  BUN 18  CREATININE 0.90  CALCIUM 8.2*    ------------------------------------------------------------------------------------------------------------------ estimated creatinine clearance is 70.9 mL/min (by C-G formula based on SCr of 0.9 mg/dL). ------------------------------------------------------------------------------------------------------------------ No results for input(s): HGBA1C in the last 72 hours. ------------------------------------------------------------------------------------------------------------------ No results for input(s): CHOL, HDL, LDLCALC, TRIG, CHOLHDL, LDLDIRECT in the last 72 hours. ------------------------------------------------------------------------------------------------------------------ No results for input(s): TSH, T4TOTAL, T3FREE, THYROIDAB in the last 72 hours.  Invalid input(s): FREET3 ------------------------------------------------------------------------------------------------------------------ No results for input(s): VITAMINB12, FOLATE, FERRITIN, TIBC, IRON, RETICCTPCT in the last 72 hours.  Coagulation profile No results for input(s): INR, PROTIME in the last 168 hours.  No results for input(s): DDIMER in the last 72 hours.  Cardiac Enzymes No results for input(s): CKMB, TROPONINI, MYOGLOBIN in the last 168 hours.  Invalid input(s): CK ------------------------------------------------------------------------------------------------------------------ Invalid input(s): [place]   1.  Uncontrolled hypertension - Continue current metoprolol 25 mg p.o. twice daily and Avapro -  Blood pressure still elevated will increase hydralazine --Elevated blood pressure suspect this is due to pain  2.  Acute postop pain secondary to left total knee replacement - Pain is being managed with analgesic  3. GERD -PPI therapy continued  4.  Hyperlipidemia -  Continue Lipitor  5.  Acute blood loss anemia mild we will follow CBC      Code Status Orders   (From admission, onward)         Start     Ordered   12/19/18 1348  Full code  Continuous     12/19/18 1347        Code Status History    Date Active Date Inactive Code Status Order  ID Comments User Context   12/30/2017 2257 01/01/2018 1940 Full Code [id number]  Lance Coon, MD Inpatient   07/25/2017 [phone] 07/25/2017 1733 Full Code [id number]  Teodoro Spray, MD Inpatient   Advance Care Planning Activity             DVT Prophylaxis Lovenox  Lab Results  Component Value Date   PLT 175 12/20/2018     Time Spent in minutes  2min Greater than 50% of time spent in care coordination and counseling patient regarding the condition and plan of care.   Dustin Flock M.D on 12/20/2018 at 12:08 PM  Between 7am to 6pm - Pager - [phone]  After 6pm go to [email or web address] - Proofreader  [employer]   Office  [phone]

## 2018-12-20 NOTE — Progress Notes (Signed)
Physical Therapy Treatment Patient Details Name: Warren Ingram MRN: [id number] DOB: 06/09/1940 Today's Date: 12/20/2018    History of Present Illness 78 y/o male s/p L TKA 12/19/18.    PT Comments    Pt did well with PT session this afternoon; he was able to circumambulate the nurses' station and did not need assist with bed mobility.  He did have some safety awareness issues with staying inside walker, set up and UE use during transfer.  He had increased tolerance of ROM tasks this afternoon, but generally had a lot of pain with PROM activities.  Pt highly motivated and is making expected post-op gains.   Follow Up Recommendations  Follow surgeon's recommendation for DC plan and follow-up therapies     Equipment Recommendations  None recommended by PT    Recommendations for Other Services       Precautions / Restrictions Precautions Precautions: Fall;Knee Precaution Booklet Issued: Yes (comment) Restrictions LLE Weight Bearing: Weight bearing as tolerated    Mobility  Bed Mobility Overal bed mobility: Modified Independent             General bed mobility comments: Reports some increased effort to lift L LE into bed, but able to do so easily w/o assist  Transfers Overall transfer level: Independent Equipment used: Rolling walker (2 wheeled)             General transfer comment: Pt still needing VCs with hand placement and insuring he was aligned with the sitting surface  Ambulation/Gait Ambulation/Gait assistance: Min guard Gait Distance (Feet): 200 Feet Assistive device: Rolling walker (2 wheeled)       General Gait Details: Pt showed good effort and overall did well, but continues to struggle with staying inside walker during turns, and regularly needed cues to straighten posture.  Pt's vitals remained stable and overall he was safe and able to maintain relatively consistent cadence and good safety.   Stairs             Wheelchair  Mobility    Modified Rankin (Stroke Patients Only)       Balance                                            Cognition Arousal/Alertness: Awake/alert Behavior During Therapy: WFL for tasks assessed/performed Overall Cognitive Status: Within Functional Limits for tasks assessed                                        Exercises Total Joint Exercises Ankle Circles/Pumps: AROM;10 reps Quad Sets: Strengthening;10 reps Short Arc Quad: Strengthening;10 reps Heel Slides: AROM;15 reps Hip ABduction/ADduction: Strengthening;10 reps Straight Leg Raises: Strengthening;10 reps Knee Flexion: 5 reps;PROM Goniometric ROM: 1-78    General Comments        Pertinent Vitals/Pain Pain Score: 6     Home Living                      Prior Function            PT Goals (current goals can now be found in the care plan section) Progress towards PT goals: Progressing toward goals    Frequency    BID      PT Plan Current plan remains appropriate    Co-evaluation  AM-PAC PT "6 Clicks" Mobility   Outcome Measure  Help needed turning from your back to your side while in a flat bed without using bedrails?: None Help needed moving from lying on your back to sitting on the side of a flat bed without using bedrails?: None Help needed moving to and from a bed to a chair (including a wheelchair)?: None Help needed standing up from a chair using your arms (e.g., wheelchair or bedside chair)?: A Little Help needed to walk in hospital room?: A Little Help needed climbing 3-5 steps with a railing? : A Lot 6 Click Score: 20    End of Session Equipment Utilized During Treatment: Gait belt Activity Tolerance: Patient tolerated treatment well;Patient limited by pain Patient left: with call bell/phone within reach;with chair alarm set Nurse Communication: Mobility status PT Visit Diagnosis: Difficulty in walking, not elsewhere  classified (R26.2);Muscle weakness (generalized) (M62.81);Pain Pain - Right/Left: Left Pain - part of body: Knee     Time: 1350-1430 PT Time Calculation (min) (ACUTE ONLY): 40 min  Charges:  $Gait Training: 8-22 mins $Therapeutic Exercise: 8-22 mins $Therapeutic Activity: 8-22 mins                     Kreg Shropshire, DPT 12/20/2018, 3:33 PM

## 2018-12-20 NOTE — Progress Notes (Signed)
Pt's BP remains significantly elevated despite receiving PRN hydralazine, MD Poggi notified, Gardiner Barefoot consulted, who came and assessed patient. Pt was given pain medication and schedule BP meds and BP remains uncontrolled. X1 dose Labetalol given and there was significant improvement in BP.

## 2018-12-20 NOTE — TOC Progression Note (Addendum)
Transition of Care [facility]) - Initial/Assessment Note    Patient Details  Name: Warren Ingram MRN: [id number] Date of Birth: 20-Oct-1940  [facility] CM/SW Contact:    Deagen Krass, Lenice Llamas Phone Number: [phone]  12/20/2018, 12:58 PM  Clinical Narrative: Per [facility] patient's outpatient PT appointment is July 30. Clinical Education officer, museum (CSW) set patient up with [facility] home health PT so he can work with PT sooner. Patient's surgeon prefers [facility]. Patient is in agreement with [facility]. CSW contacted patient's wife Vaughan Basta and made her aware of above. Vaughan Basta is in agreement with plan. Helene Kelp [facility] home health representative is aware of above. CSW notified patient of his Lovenox price $97.52. Per patient he can afford the Lovenox.                 Expected Discharge Plan: [place] Barriers to Discharge: Continued Medical Work up   Patient Goals and CMS Choice Patient states their goals for this hospitalization and ongoing recovery are:: go home CMS Medicare.gov Compare Post Acute Care list provided to:: Patient Choice offered to / list presented to : Patient  Expected Discharge Plan and Services Expected Discharge Plan: [place]   Discharge Planning Services: CM Consult Post Acute Care Choice: [place] arrangements for the past 2 months: Single Family Home Expected Discharge Date: 12/20/18               DME Arranged: N/A         HH Arranged: Refused HH(Wants to continue with outpatient PT at [facility] clinic)          Prior Living Arrangements/Services Living arrangements for the past 2 months: Single Family Home Lives with:: Spouse Patient language and need for interpreter reviewed:: No Do you feel safe going back to the place where you live?: Yes      Need for Family Participation in Patient Care: No (Comment) Care giver support system in place?: Yes (comment) Current home services: DME(RW, [facility]SC  shower chair) Criminal Activity/Legal Involvement Pertinent to Current Situation/Hospitalization: No - Comment as needed  Activities of Daily Living Home Assistive Devices/Equipment: None ADL Screening (condition at time of admission) Patient's cognitive ability adequate to safely complete daily activities?: Yes Is the patient deaf or have difficulty hearing?: No Does the patient have difficulty seeing, even when wearing glasses/contacts?: No Does the patient have difficulty concentrating, remembering, or making decisions?: No Patient able to express need for assistance with ADLs?: Yes Does the patient have difficulty dressing or bathing?: No Independently performs ADLs?: Yes (appropriate for developmental age) Does the patient have difficulty walking or climbing stairs?: No Weakness of Legs: None Weakness of Arms/Hands: None  Permission Sought/Granted Permission sought to share information with : Case Manager                Emotional Assessment Appearance:: Appears stated age Attitude/Demeanor/Rapport: Engaged Affect (typically observed): Accepting, Pleasant, Happy, Appropriate Orientation: : Oriented to Self, Oriented to Place, Oriented to  Time, Oriented to Situation Alcohol / Substance Use: Other (comment) Psych Involvement: No (comment)  Admission diagnosis:  Status post total knee replacement using cement, left [Z96.652] Patient Active Problem List   Diagnosis Date Noted  . 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 Hematochezia 12/30/2017  . Hypothyroidism 12/30/2017  . 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 Benign neoplasm of colon  08/21/2013  . CAD in native artery 08/21/2013  . Diverticulitis of colon 08/21/2013  . GERD (gastroesophageal reflux disease) 08/21/2013  . Benign essential HTN 08/21/2013  . Anal bleeding 08/21/2013  . Arthritis, degenerative  08/21/2013  . 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 Pure hypercholesterolemia 08/21/2013  . Adenomatous colon polyp 08/21/2013  . Osteoarthrosis, unspecified whether generalized or localized, pelvic region and thigh 08/21/2013   PCP:  Idelle Crouch, MD Pharmacy:   [facility] [place], [place] [facility] [address] AT [place] [facility] [place] [address] Phone: [phone] Fax: [phone]     Social Determinants of Health (SDOH) Interventions    Readmission Risk Interventions No flowsheet data found.

## 2018-12-20 NOTE — Evaluation (Signed)
Physical Therapy Evaluation Patient Details Name: Warren Ingram MRN: [id number] DOB: [birth year] Today's Date: 12/20/2018   History of Present Illness  78 y/o male s/p L TKA 12/19/18.  Clinical Impression  Pt did well with first PT session post-op, he was able to do SLRs w/o assist and showed good effort and strength with all exercises, he did not need assist with bed mobility and though he was slow and guarded with gait initially he was ultimately able to maintain consistent cadence with ~75 ft of ambulation using walker.  Pt with considerable pain during ROM tasks and though he was able to achieve >60 with heavy effort (and increased pain) he is relatively stiff in the knee.  Pt motivated t/o the effort and ultimately eager to work hard and get home.   Follow Up Recommendations Follow surgeon's recommendation for DC plan and follow-up therapies    Equipment Recommendations  None recommended by PT    Recommendations for Other Services       Precautions / Restrictions Precautions Precautions: Fall;Knee Restrictions LLE Weight Bearing: Weight bearing as tolerated      Mobility  Bed Mobility Overal bed mobility: Modified Independent             General bed mobility comments: Pt able to get LEs off EOB and attain sitting with relative ease  Transfers Overall transfer level: Independent Equipment used: Rolling walker (2 wheeled)             General transfer comment: Pt is able to rise to standing with cues for set up, foot/hand placement and sequencing - no direct phyiscal assist needed.  Ambulation/Gait Ambulation/Gait assistance: Min guard Gait Distance (Feet): 75 Feet Assistive device: Rolling walker (2 wheeled)       General Gait Details: Pt was able to ambulate with good confidence and speed. He had expected mild limp, but was able to maintain consistent cadence and walker momentum and generally did quite well.   Stairs            Wheelchair  Mobility    Modified Rankin (Stroke Patients Only)       Balance Overall balance assessment: Modified Independent                                           Pertinent Vitals/Pain Pain Assessment: 0-10 Pain Score: 4  Pain Location: L knee pain increases significantly with ROM tasks    Home Living Family/patient expects to be discharged to:: Private residence Living Arrangements: Spouse/significant other Available Help at Discharge: Family;Available 24 hours/day   Home Access: Stairs to enter Entrance Stairs-Rails: Chemical engineer of Steps: 2 Home Layout: Able to live on main level with bedroom/bathroom Home Equipment: Walker - 2 wheels;Cane - single point      Prior Function Level of Independence: Independent         Comments: Pt with increasing knee pain recently, but able to drive and be out of the home     Hand Dominance        Extremity/Trunk Assessment   Upper Extremity Assessment Upper Extremity Assessment: Overall WFL for tasks assessed    Lower Extremity Assessment Lower Extremity Assessment: Overall WFL for tasks assessed(expected post-op weakness, functional t/o)       Communication   Communication: No difficulties  Cognition Arousal/Alertness: Awake/alert Behavior During Therapy: WFL for tasks assessed/performed Overall Cognitive Status: Within  Functional Limits for tasks assessed                                        General Comments      Exercises Total Joint Exercises Ankle Circles/Pumps: AROM;10 reps Quad Sets: Strengthening;10 reps Short Arc Quad: Strengthening;10 reps Heel Slides: AROM;10 reps Hip ABduction/ADduction: Strengthening;10 reps Straight Leg Raises: AROM;10 reps Knee Flexion: 5 reps;PROM Goniometric ROM: 1-61   Assessment/Plan    PT Assessment Patient needs continued PT services  PT Problem List Decreased strength;Decreased range of motion;Decreased activity  tolerance;Decreased balance;Decreased mobility;Decreased coordination;Decreased knowledge of use of DME;Decreased safety awareness;Pain       PT Treatment Interventions DME instruction;Gait training;Stair training;Functional mobility training;Therapeutic activities;Therapeutic exercise;Balance training;Neuromuscular re-education;Patient/family education    PT Goals (Current goals can be found in the Care Plan section)  Acute Rehab PT Goals Patient Stated Goal: go home PT Goal Formulation: With patient Time For Goal Achievement: 01/03/19 Potential to Achieve Goals: Good    Frequency BID   Barriers to discharge        Co-evaluation               AM-PAC PT "6 Clicks" Mobility  Outcome Measure Help needed turning from your back to your side while in a flat bed without using bedrails?: None Help needed moving from lying on your back to sitting on the side of a flat bed without using bedrails?: None Help needed moving to and from a bed to a chair (including a wheelchair)?: None Help needed standing up from a chair using your arms (e.g., wheelchair or bedside chair)?: A Little Help needed to walk in hospital room?: A Little Help needed climbing 3-5 steps with a railing? : A Lot 6 Click Score: 20    End of Session Equipment Utilized During Treatment: Gait belt Activity Tolerance: Patient tolerated treatment well;Patient limited by pain Patient left: with call bell/phone within reach;with chair alarm set Nurse Communication: Mobility status PT Visit Diagnosis: Difficulty in walking, not elsewhere classified (R26.2);Muscle weakness (generalized) (M62.81);Pain Pain - Right/Left: Left Pain - part of body: Knee    Time: 1157-2620 PT Time Calculation (min) (ACUTE ONLY): 35 min   Charges:   PT Evaluation $PT Eval Low Complexity: 1 Low PT Treatments $Gait Training: 8-22 mins $Therapeutic Exercise: 8-22 mins        Kreg Shropshire, DPT 12/20/2018, 10:38 AM

## 2018-12-20 NOTE — Progress Notes (Signed)
  Subjective: 1 Day Post-Op Procedure(s) (LRB): TOTAL KNEE ARTHROPLASTY (Left) Patient reports pain as 6 on 0-10 scale.  States that it is much better compared to yesterday. Internal medicine consulted for uncontrolled HTN, BP 155/69 this morning. Plan is to go Home after hospital stay. Negative for chest pain and shortness of breath Fever: no Gastrointestinal:Negative for nausea and vomiting  Objective: Vital signs in last 24 hours: Temp:  [97.5 F (36.4 C)-98.6 F (37 C)] 98.2 F (36.8 C) (07/17 0743) Pulse Rate:  [56-76] 63 (07/17 0743) Resp:  [15-21] 19 (07/17 0424) BP: (119-196)/(56-92) 155/69 (07/17 0743) SpO2:  [96 %-100 %] 98 % (07/17 0743) Weight:  [83.9 kg] 83.9 kg (07/16 0808)  Intake/Output from previous day:  Intake/Output Summary (Last 24 hours) at 12/20/2018 0758 Last data filed at 12/20/2018 0542 Gross per 24 hour  Intake 1720 ml  Output 460 ml  Net 1260 ml    Intake/Output this shift: No intake/output data recorded.  Labs: Recent Labs    12/20/18 0420  HGB 11.0*   Recent Labs    12/20/18 0420  WBC 8.7  RBC 4.21*  HCT 35.0*  PLT 175   Recent Labs    12/20/18 0420  NA 135  K 4.0  CL 99  CO2 25  BUN 18  CREATININE 0.90  GLUCOSE 131*  CALCIUM 8.2*   No results for input(s): LABPT, INR in the last 72 hours.   EXAM General - Patient is Alert, Appropriate and Oriented Extremity - Sensation intact distally Intact pulses distally Dorsiflexion/Plantar flexion intact Incision: dressing C/D/I No cellulitis present  Negative Homans to the left leg. Reports chronic decreased sensation to the left leg following surgery years ago, intact to light touch this morning. Dressing/Incision - clean, dry, no drainage Motor Function - intact, moving foot and toes well on exam.  Abdomen with normal bowel sounds but moderate distention this morning.  Past Medical History:  Diagnosis Date  . Arthritis   . Barrett esophagus   . Benign neoplasm of  abdomen   . Benign neoplasm of colon   . Cancer ([place])    skin cancer  . Coronary artery disease   . GERD (gastroesophageal reflux disease)   . Hypercholesterolemia   . Hypothyroidism   . Stroke ([place]) 2015   mini stroke    Assessment/Plan: 1 Day Post-Op Procedure(s) (LRB): TOTAL KNEE ARTHROPLASTY (Left) Active Problems:   Status post total knee replacement using cement, left  Estimated body mass index is 29.86 kg/m as calculated from the following:   Height as of this encounter: 5\' 6"  (1.676 m).   Weight as of this encounter: 83.9 kg. Advance diet Up with therapy D/C IV fluids when tolerating po intake.  Labs reviewed this AM, CBC and BMP ordered for tomorrow morning. BP improving today, continue per internal med recommendations. Up with therapy today. Begin working on [employer], patient states he is passing gas without pain. Plan for d/c home tomorrow pending progress made today.  DVT Prophylaxis - Lovenox, Foot Pumps and TED hose Weight-Bearing as tolerated to left leg  J. Cameron Proud, PA-C [facility] Orthopaedic Surgery 12/20/2018, 7:58 AM

## 2018-12-20 NOTE — TOC Benefit Eligibility Note (Signed)
Transition of Care [facility]) Benefit Eligibility Note    Patient Details  Name: Warren Ingram MRN: [id number] Date of Birth: February 18, 1941   Medication/Dose: Enoxaparin 19m once daily for 14 days  Covered?: Yes  Tier: (Tier 4)  Prescription Coverage [facility]erred Pharm[facility]: [facility] Person/Company/Phone Number:: [facility] [employer] at [phone] Co-Pay: $97.52 estimated copay  Prior Approval: No  Deductible: Unmet($250 deductible, $179.48 met as of time of call.)       HDannette BarbaraPhone Number: [phone] [phone]/17/2020, 2:10 PM

## 2018-12-21 DIAGNOSIS — M1712 Unilateral primary osteoarthritis, left knee: Secondary | ICD-10-CM | POA: Diagnosis not present

## 2018-12-21 LAB — CBC
HCT: 36.7 % — ABNORMAL LOW (ref 39.0–52.0)
Hemoglobin: 11.9 g/dL — ABNORMAL LOW (ref 13.0–17.0)
MCH: 26.3 pg (ref 26.0–34.0)
MCHC: 32.4 g/dL (ref 30.0–36.0)
MCV: 81.2 fL (ref 80.0–100.0)
Platelets: 196 10*3/uL (ref 150–400)
RBC: 4.52 MIL/uL (ref 4.22–5.81)
RDW: 15.6 % — ABNORMAL HIGH (ref 11.5–15.5)
WBC: 10.2 10*3/uL (ref 4.0–10.5)
nRBC: 0 % (ref 0.0–0.2)

## 2018-12-21 LAB — BASIC METABOLIC PANEL
Anion gap: 8 (ref 5–15)
BUN: 12 mg/dL (ref 8–23)
CO2: 26 mmol/L (ref 22–32)
Calcium: 9 mg/dL (ref 8.9–10.3)
Chloride: 104 mmol/L (ref 98–111)
Creatinine, Ser: 0.82 mg/dL (ref 0.61–1.24)
GFR calc Af Amer: >60 mL/min (ref >60)
GFR calc non Af Amer: >60 mL/min (ref >60)
Glucose, Bld: 135 mg/dL — ABNORMAL HIGH (ref 70–99)
Potassium: 4.2 mmol/L (ref 3.5–5.1)
Sodium: 138 mmol/L (ref 135–145)

## 2018-12-21 MED ORDER — TAMSULOSIN HCL 0.4 MG PO CAPS
0.8000 mg | ORAL_CAPSULE | Freq: Every day | ORAL | Status: DC
  Administered 2018-12-21: 0.8 mg via ORAL
  Filled 2018-12-21: qty 2

## 2018-12-21 NOTE — Progress Notes (Signed)
Indwelling foley cath inserted after voiding trial failed per MD order. Clear light yellow urine obtained immediately upon insertion. Pt tolerated well.  Foley was discontinued this am at 1000. Approx 12 noon pt stood at bedside to try to void and was only able to eliminate 25 mls.  MD notified and rec'd orders to reinsert foley cath and cancel D/C for today.

## 2018-12-21 NOTE — TOC Transition Note (Signed)
Transition of Care [facility]) - CM/SW Discharge Note   Patient Details  Name: Warren Ingram MRN: [id number] Date of Birth: 1940-12-03  Transition of Care [facility]) CM/SW Contact:  Latanya Maudlin, RN Phone Number: 12/21/2018, 8:36 AM   Clinical Narrative:  Patient to be discharged per MD order. Orders in place for home health services. Previous TOC team member has set up services for home health via [facility]. Patient has all DME needed. Family to transport.       Final next level of care: [place] Barriers to Discharge: No Barriers Identified   Patient Goals and CMS Choice Patient states their goals for this hospitalization and ongoing recovery are:: go home CMS Medicare.gov Compare Post Acute Care list provided to:: Patient Choice offered to / list presented to : Patient  Discharge Placement                       Discharge Plan and Services   Discharge Planning Services: CM Consult Post Acute Care Choice: Home Health          DME Arranged: N/A         HH Arranged: PT [place] Agency: [facility] (formerly [employer]) Date [place]: 12/21/18 Time [place]: [phone] Representative spoke with at [place]: [employer] ([facility]) Interventions     Readmission Risk Interventions Readmission Risk Prevention Plan 12/21/2018  Post Dischage Appt Complete  Medication Screening Complete  Transportation Screening Complete  Some recent data might be hidden

## 2018-12-21 NOTE — Progress Notes (Addendum)
  Subjective: 2 Days Post-Op Procedure(s) (LRB): TOTAL KNEE ARTHROPLASTY (Left) Patient reports pain as 6 on 0-10 scale.  States that it is much better compared to yesterday. Plan is to go Home after hospital stay. Negative for chest pain and shortness of breath Fever: no Gastrointestinal:Negative for nausea and vomiting  Objective: Vital signs in last 24 hours: Temp:  [98.2 F (36.8 C)-99 F (37.2 C)] 99 F (37.2 C) (07/18 0024) Pulse Rate:  [63-80] 80 (07/18 0024) Resp:  [18] 18 (07/18 0024) BP: (142-181)/(61-83) 148/61 (07/18 0024) SpO2:  [98 %-99 %] 99 % (07/18 0024)  Intake/Output from previous day:  Intake/Output Summary (Last 24 hours) at 12/21/2018 0656 Last data filed at 12/21/2018 0600 Gross per 24 hour  Intake 1787.03 ml  Output 4480 ml  Net -2692.97 ml    Intake/Output this shift: Total I/O In: 1307 [I.V.:1307] Out: 3850 [Urine:3850]  Labs: Recent Labs    12/20/18 0420 12/21/18 0449  HGB 11.0* 11.9*   Recent Labs    12/20/18 0420 12/21/18 0449  WBC 8.7 10.2  RBC 4.21* 4.52  HCT 35.0* 36.7*  PLT 175 196   Recent Labs    12/20/18 0420 12/21/18 0449  NA 135 138  K 4.0 4.2  CL 99 104  CO2 25 26  BUN 18 12  CREATININE 0.90 0.82  GLUCOSE 131* 135*  CALCIUM 8.2* 9.0   No results for input(s): LABPT, INR in the last 72 hours.   EXAM General - Patient is Alert, Appropriate and Oriented Extremity - Sensation intact distally Intact pulses distally Dorsiflexion/Plantar flexion intact Incision: dressing C/D/I No cellulitis present  Negative Homans to the left leg. Reports chronic decreased sensation to the left leg following surgery years ago, intact to light touch this morning. Dressing/Incision - clean, dry, no drainage.  Ace wrap removed. Motor Function - intact, moving foot and toes well on exam.  Abdomen with normal bowel sounds but moderate distention this morning.  Past Medical History:  Diagnosis Date  . Arthritis   . Barrett  esophagus   . Benign neoplasm of abdomen   . Benign neoplasm of colon   . Cancer ([place])    skin cancer  . Coronary artery disease   . GERD (gastroesophageal reflux disease)   . Hypercholesterolemia   . Hypothyroidism   . Stroke ([place]) 2015   mini stroke    Assessment/Plan: 2 Days Post-Op Procedure(s) (LRB): TOTAL KNEE ARTHROPLASTY (Left) Active Problems:   Status post total knee replacement using cement, left  Estimated body mass index is 29.86 kg/m as calculated from the following:   Height as of this encounter: 5\' 6"  (1.676 m).   Weight as of this encounter: 83.9 kg. Advance diet Up with therapy D/C IV fluids when tolerating po intake.  BP improving today, continue per internal med recommendations. Up with therapy today. Continue working on [employer], patient states he is passing gas without pain. Plan for d/c home today pending progress made today.  DVT Prophylaxis - Lovenox, Foot Pumps and TED hose Weight-Bearing as tolerated to left leg  Reche Dixon, PA-C [facility] Orthopaedic Surgery 12/21/2018, 6:56 AM   Having post op urinary retention.  Foley reinserted. Increase Flomax dose Discharge cancelled for today secondary to this.

## 2018-12-21 NOTE — Discharge Summary (Addendum)
Physician Discharge Summary  Subjective: 3 Days Post-Op Procedure(s) (LRB): TOTAL KNEE ARTHROPLASTY (Left) Patient reports pain as mild.   Patient seen in rounds with Dr. Rudene Christians. Patient is well, and has had no acute complaints or problems.  The patient has had some urinary retention.  The patient has been increased on his Flomax. Patient is ready to go home with home health physical therapy  Physician Discharge Summary  Patient ID: Warren Ingram MRN: [id number] DOB/AGE: August 04, 1940 78 y.o.  Admit date: 12/19/2018 Discharge date: 12/22/2018  Admission Diagnoses:  Discharge Diagnoses:  Active Problems:   Status post total knee replacement using cement, left   Discharged Condition: fair  Hospital Course: The patient is postop day 2 from a left total knee replacement.  He is done very well since surgery.  He ambulated 200 feet with physical therapy.  The patient has stable vitals this morning.  He was seen by medicine for hypertension which is improved.  The patient has better pain control this morning.  He is ready to go home with home health physical therapy.  Treatments: surgery:    Left TKA using all-cemented [employer] Vanguard system with a 70 mm PCR femur, a 75 mm tibial tray with a 10 mm E-poly insert, and a 34 x 8.5 mm all-poly 3-pegged domed patella.  Surgeon:   Pascal Lux, MD  Assistant:   Cameron Proud, PA-C   Anesthesia:   Spinal  Findings:   As above  Complications:   None  EBL:   10 cc  Fluids:   1100 cc crystalloid  UOP:   325 cc  TT:   96 minutes at 300 mmHg  Drains:   None  Closure:   Staples  Implants:   As above  Discharge Exam: Blood pressure (!) 143/61, pulse 92, temperature 98.6 F (37 C), temperature source Oral, resp. rate 16, height 5\' 6"  (1.676 m), weight 83.9 kg, SpO2 96 %.   Disposition: Discharge disposition: 01-Home or Self Care        Allergies as of 12/22/2018   No Known Allergies     Medication List     TAKE these medications   acetaminophen 500 MG tablet Commonly known as: TYLENOL Take 500 mg by mouth at bedtime as needed for moderate pain or headache.   aspirin EC 81 MG EC tablet Generic drug: aspirin Take 81 mg by mouth daily. Swallow whole.   atorvastatin 80 MG tablet Commonly known as: LIPITOR Take 80 mg by mouth at bedtime.   enoxaparin 40 MG/0.4ML injection Commonly known as: LOVENOX Inject 0.4 mLs (40 mg total) into the skin daily.   FISH OIL PO Take 1 capsule by mouth 2 (two) times a day.   fluticasone 50 MCG/ACT nasal spray Commonly known as: FLONASE Place 1 spray into both nostrils 2 (two) times daily as needed.   hydrALAZINE 25 MG tablet Commonly known as: APRESOLINE Take 1 tablet (25 mg total) by mouth every 8 (eight) hours.   levothyroxine 150 MCG tablet Commonly known as: SYNTHROID Take 150 mcg by mouth daily before breakfast.   meloxicam 15 MG tablet Commonly known as: MOBIC Take 15 mg by mouth as needed.   metoprolol tartrate 25 MG tablet Commonly known as: LOPRESSOR Take 25 mg by mouth 2 (two) times daily.   omeprazole 20 MG capsule Commonly known as: PRILOSEC Take 20 mg by mouth every morning.   oxyCODONE 5 MG immediate release tablet Commonly known as: Oxy IR/ROXICODONE Take 1-2 tablets (5-10 mg  total) by mouth every 4 (four) hours as needed for moderate pain.   tamsulosin 0.4 MG Caps capsule Commonly known as: FLOMAX Take 2 capsules (0.8 mg total) by mouth at bedtime. What changed: how much to take   telmisartan 40 MG tablet Commonly known as: MICARDIS Take 40 mg by mouth every morning.   traMADol 50 MG tablet Commonly known as: ULTRAM Take 1 tablet (50 mg total) by mouth every 6 (six) hours as needed for moderate pain.            Durable Medical Equipment  (From admission, onward)         Start     Ordered   12/19/18 1348  DME Bedside commode  Once    Question:  Patient needs a bedside commode to treat with the  following condition  Answer:  Status post total knee replacement using cement, left   12/19/18 1347   12/19/18 1348  DME 3 n 1  Once     12/19/18 1347   12/19/18 1348  DME Walker rolling  Once    Question:  Patient needs a walker to treat with the following condition  Answer:  Status post total knee replacement using cement, left   12/19/18 1347           Signed: Nyana Haren 12/22/2018, 7:09 AM   Objective: Vital signs in last 24 hours: Temp:  [98.6 F (37 C)-98.8 F (37.1 C)] 98.6 F (37 C) (07/19 0007) Pulse Rate:  [86-92] 92 (07/19 0007) Resp:  [16-18] 16 (07/19 0007) BP: (142-170)/(60-70) 143/61 (07/19 0007) SpO2:  [96 %-100 %] 96 % (07/19 0007)  Intake/Output from previous day:  Intake/Output Summary (Last 24 hours) at 12/22/2018 0709 Last data filed at 12/22/2018 0500 Gross per 24 hour  Intake 900 ml  Output 5600 ml  Net -4700 ml    Intake/Output this shift: No intake/output data recorded.  Labs: Recent Labs    12/20/18 0420 12/21/18 0449  HGB 11.0* 11.9*   Recent Labs    12/20/18 0420 12/21/18 0449  WBC 8.7 10.2  RBC 4.21* 4.52  HCT 35.0* 36.7*  PLT 175 196   Recent Labs    12/21/18 0449 12/22/18 0526  NA 138 137  K 4.2 4.4  CL 104 101  CO2 26 28  BUN 12 15  CREATININE 0.82 0.88  GLUCOSE 135* 128*  CALCIUM 9.0 9.0   No results for input(s): LABPT, INR in the last 72 hours.  EXAM: General - Patient is Alert and Oriented Extremity - Sensation intact distally Intact pulses distally Compartment soft Incision - clean, dry, no drainage Motor Function -plantarflexion and dorsiflexion are intact.  Assessment/Plan: 3 Days Post-Op Procedure(s) (LRB): TOTAL KNEE ARTHROPLASTY (Left) Procedure(s) (LRB): TOTAL KNEE ARTHROPLASTY (Left) Past Medical History:  Diagnosis Date  . Arthritis   . Barrett esophagus   . Benign neoplasm of abdomen   . Benign neoplasm of colon   . Cancer ([place])    skin cancer  . Coronary artery disease   . GERD  (gastroesophageal reflux disease)   . Hypercholesterolemia   . Hypothyroidism   . Stroke [facility]) 2015   mini stroke   Active Problems:   Status post total knee replacement using cement, left  Estimated body mass index is 29.86 kg/m as calculated from the following:   Height as of this encounter: 5\' 6"  (1.676 m).   Weight as of this encounter: 83.9 kg. Advance diet Up with therapy D/C IV fluids Discharge home  with home health Diet - Regular diet Follow up - in 2 weeks Activity - WBAT Disposition - Home Condition Upon Discharge - Stable DVT Prophylaxis - Lovenox, Foot Pumps and TED hose  Reche Dixon, PA-C Orthopaedic Surgery 12/22/2018, 7:09 AM

## 2018-12-21 NOTE — Progress Notes (Signed)
Indwelling foley cath removed with 850 mls of straw colored urine emptied.

## 2018-12-21 NOTE — Progress Notes (Signed)
[place] at [facility]                                                                                                                                                                                  Patient Demographics   Warren Ingram, is a 78 y.o. male, DOB - 03/10/1941, VQQ:[id number]  Admit date - 12/19/2018   Admitting Physician Corky Mull, MD  Outpatient Primary MD for the patient is Sparks, Leonie Douglas, MD   LOS - 2  Subjective:     Review of Systems:   CONSTITUTIONAL: No documented fever. No fatigue, weakness. No weight gain, no weight loss.  EYES: No blurry or double vision.  ENT: No tinnitus. No postnasal drip. No redness of the oropharynx.  RESPIRATORY: No cough, no wheeze, no hemoptysis. No dyspnea.  CARDIOVASCULAR: No chest pain. No orthopnea. No palpitations. No syncope.  GASTROINTESTINAL: No nausea, no vomiting or diarrhea. No abdominal pain. No melena or hematochezia.  GENITOURINARY: No dysuria or hematuria.  ENDOCRINE: No polyuria or nocturia. No heat or cold intolerance.  HEMATOLOGY: No anemia. No bruising. No bleeding.  INTEGUMENTARY: No rashes. No lesions.  MUSCULOSKELETAL: No arthritis. No swelling. No gout.  NEUROLOGIC: No numbness, tingling, or ataxia. No seizure-type activity.  PSYCHIATRIC: No anxiety. No insomnia. No ADD.    Vitals:   Vitals:   12/20/18 2251 12/21/18 0024 12/21/18 0727 12/21/18 0728  BP: (!) 181/73 (!) 148/61 (!) 170/70 (!) 167/60  Pulse: 77 80 86 91  Resp:  18 18   Temp: 98.4 F (36.9 C) 99 F (37.2 C) 98.6 F (37 C)   TempSrc: Oral Oral    SpO2: 98% 99% 97%   Weight:      Height:        Wt Readings from Last 3 Encounters:  12/19/18 83.9 kg  01/10/18 82.2 kg  12/30/17 81.6 kg     Intake/Output Summary (Last 24 hours) at 12/21/2018 1256 Last data filed at 12/21/2018 1241 Gross per 24 hour  Intake 1967.03 ml  Output 6875 ml  Net -4907.97 ml    Physical Exam:   GENERAL:  Pleasant-appearing in no apparent distress.  HEAD, EYES, EARS, NOSE AND THROAT: Atraumatic, normocephalic. Extraocular muscles are intact. Pupils equal and reactive to light. Sclerae anicteric. No conjunctival injection. No oro-pharyngeal erythema.  NECK: Supple. There is no jugular venous distention. No bruits, no lymphadenopathy, no thyromegaly.  HEART: Regular rate and rhythm,. No murmurs, no rubs, no clicks.  LUNGS: Clear to auscultation bilaterally. No rales or rhonchi. No wheezes.  ABDOMEN: Soft, flat, nontender, nondistended. Has good bowel sounds. No hepatosplenomegaly appreciated.  EXTREMITIES: No evidence of any cyanosis, clubbing,  or peripheral edema.  +2 pedal and radial pulses bilaterally.  NEUROLOGIC: The patient is alert, awake, and oriented x3 with no focal motor or sensory deficits appreciated bilaterally.  SKIN: Moist and warm with no rashes appreciated.  Psych: Not anxious, depressed LN: No inguinal LN enlargement    Antibiotics   Anti-infectives (From admission, onward)   Start     Dose/Rate Route Frequency Ordered Stop   12/19/18 1600  ceFAZolin (ANCEF) IVPB 2g/100 mL premix     2 g 200 mL/hr over 30 Minutes Intravenous Every 6 hours 12/19/18 1347 12/20/18 0440   12/19/18 0811  ceFAZolin (ANCEF) 2-4 GM/100ML-% IVPB    Note to Pharmacy: Norton Blizzard  : cabinet override      12/19/18 0811 12/19/18 1012   12/19/18 0615  ceFAZolin (ANCEF) IVPB 2g/100 mL premix     2 g 200 mL/hr over 30 Minutes Intravenous  Once 12/19/18 0601 12/19/18 1027      Medications   Scheduled Meds: . aspirin EC  81 mg Oral Daily  . atorvastatin  80 mg Oral QHS  . docusate sodium  100 mg Oral BID  . enoxaparin (LOVENOX) injection  40 mg Subcutaneous Q24H  . hydrALAZINE  50 mg Oral Q8H  . irbesartan  150 mg Oral Daily  . levothyroxine  150 mcg Oral QAC breakfast  . metoprolol tartrate  25 mg Oral BID  . omega-3 acid ethyl esters  1 g Oral BID  . pantoprazole  40 mg Oral Daily  .  tamsulosin  0.4 mg Oral QHS   Continuous Infusions: . sodium chloride 75 mL/hr at 12/19/18 1439   PRN Meds:.acetaminophen, bisacodyl, diphenhydrAMINE, fluticasone, hydrALAZINE, HYDROmorphone (DILAUDID) injection, magnesium hydroxide, metoCLOPramide **OR** metoCLOPramide (REGLAN) injection, ondansetron **OR** ondansetron (ZOFRAN) IV, oxyCODONE, sodium phosphate, traMADol   Data Review:   Micro Results Recent Results (from the past 240 hour(s))  Surgical pcr screen     Status: None   Collection Time: 12/12/18  1:42 PM   Specimen: Nasal Mucosa; Nasal Swab  Result Value Ref Range Status   MRSA, PCR NEGATIVE NEGATIVE Final   Staphylococcus aureus NEGATIVE NEGATIVE Final    Comment: (NOTE) The Xpert SA Assay (FDA approved for NASAL specimens in patients 31 years of age and older), is one component of a comprehensive surveillance program. It is not intended to diagnose infection nor to guide or monitor treatment. Performed at [facility], [address], [place], [place] [address]   Urine culture     Status: None   Collection Time: 12/12/18  2:07 PM   Specimen: Urine, Random  Result Value Ref Range Status   Specimen Description   Final    URINE, RANDOM Performed at [facility], [address], [place], [place] [address]    Special Requests   Final    NONE Performed at [facility], [address] [place], [place] [address]    Culture   Final    NO GROWTH Performed at [facility], [place] [address] [place],  [address]    Report Status 12/13/2018 FINAL  Final  SARS Coronavirus 2 (Performed in [place] hospital lab)     Status: None   Collection Time: 12/16/18 10:00 AM   Specimen: Nasal Swab  Result Value Ref Range Status   SARS Coronavirus 2 NEGATIVE NEGATIVE Final    Comment: (NOTE) SARS-CoV-2 target nucleic acids are NOT DETECTED. The SARS-CoV-2 RNA is generally detectable in upper and lower respiratory specimens during the  acute phase of infection. Negative results do not preclude SARS-CoV-2 infection, do not rule out co-infections with other pathogens, and should not be used as the sole basis for treatment or other patient management decisions. Negative results must be combined with clinical observations, patient history, and epidemiological information. The expected result is Negative. Fact Sheet for Patients: SugarRoll.be Fact Sheet for Healthcare Providers: https://www.woods-mathews.com/ This test is not yet approved or cleared by the [place] FDA and  has been authorized for detection and/or diagnosis of SARS-CoV-2 by FDA under an Emergency Use Authorization (EUA). This EUA will remain  in effect (meaning this test can be used) for the duration of the COVID-19 declaration under Section 56 4(b)(1) of the Act, 21 U.S.C. section 360bbb-3(b)(1), unless the authorization is terminated or revoked sooner. Performed at [facility], [place] [address] [place], [place] [address]     Radiology Reports Dg Knee Left Port  Result Date: 12/19/2018 CLINICAL DATA:  Postop day 0 LEFT total knee arthroplasty. EXAM: PORTABLE LEFT KNEE - 1-2 VIEW COMPARISON:  None. FINDINGS: Anatomic alignment post LEFT total knee arthroplasty. No acute complicating features. Air-fluid level in the joint space as expected immediately postoperatively. IMPRESSION: Anatomic alignment post LEFT total knee arthroplasty without acute complicating features. Electronically Signed   By: Evangeline Dakin M.D.   On: 12/19/2018 14:44     CBC Recent Labs  Lab 12/20/18 0420 12/21/18 0449  WBC 8.7 10.2  HGB 11.0* 11.9*  HCT 35.0* 36.7*  PLT 175 196  MCV 83.1 81.2  MCH 26.1 26.3  MCHC 31.4 32.4  RDW 15.4 15.6*  LYMPHSABS 1.4  --   MONOABS 0.7  --   EOSABS 0.1  --   BASOSABS 0.0  --     Chemistries  Recent Labs  Lab 12/20/18 0420 12/21/18 0449  NA 135 138  K 4.0 4.2  CL 99 104   CO2 25 26  GLUCOSE 131* 135*  BUN 18 12  CREATININE 0.90 0.82  CALCIUM 8.2* 9.0   ------------------------------------------------------------------------------------------------------------------ estimated creatinine clearance is 77.8 mL/min (by C-G formula based on SCr of 0.82 mg/dL). ------------------------------------------------------------------------------------------------------------------ No results for input(s): HGBA1C in the last 72 hours. ------------------------------------------------------------------------------------------------------------------ No results for input(s): CHOL, HDL, LDLCALC, TRIG, CHOLHDL, LDLDIRECT in the last 72 hours. ------------------------------------------------------------------------------------------------------------------ No results for input(s): TSH, T4TOTAL, T3FREE, THYROIDAB in the last 72 hours.  Invalid input(s): FREET3 ------------------------------------------------------------------------------------------------------------------ No results for input(s): VITAMINB12, FOLATE, FERRITIN, TIBC, IRON, RETICCTPCT in the last 72 hours.  Coagulation profile No results for input(s): INR, PROTIME in the last 168 hours.  No results for input(s): DDIMER in the last 72 hours.  Cardiac Enzymes No results for input(s): CKMB, TROPONINI, MYOGLOBIN in the last 168 hours.  Invalid input(s): CK ------------------------------------------------------------------------------------------------------------------ Invalid input(s): [place]   1.  Uncontrolled hypertension - Continue current metoprolol 25 mg p.o. twice daily and Avapro -  Blood pressure still elevated Added hydralazine --Elevated blood pressure suspect this is due to pain - Patient can be discharged on current medications and advised to follow with primary care physician in 1 week to readjust the blood pressure medications.  2.  Acute postop pain secondary to  left total knee replacement - Pain is being managed with analgesic  3. GERD -PPI therapy continued  4.  Hyperlipidemia -  Continue Lipitor  5.  Acute blood loss anemia mild we will follow CBC      Code Status Orders  (From admission, onward)  Start     Ordered   12/19/18 1348  Full code  Continuous     12/19/18 1347        Code Status History    Date Active Date Inactive Code Status Order ID Comments User Context   12/30/2017 2257 01/01/2018 1940 Full Code [id number]  Lance Coon, MD Inpatient   07/25/2017 [phone] 07/25/2017 1733 Full Code [id number]  Teodoro Spray, MD Inpatient   Advance Care Planning Activity             DVT Prophylaxis Lovenox  Lab Results  Component Value Date   PLT 196 12/21/2018     Time Spent in minutes  51min Greater than 50% of time spent in care coordination and counseling patient regarding the condition and plan of care.   Vaughan Basta M.D on 12/21/2018 at 12:56 PM  Between 7am to 6pm - Pager - [phone]  After 6pm go to [email or web address] - Proofreader  [employer]   Office  [phone]

## 2018-12-21 NOTE — Discharge Instructions (Signed)

## 2018-12-21 NOTE — Progress Notes (Signed)
Physical Therapy Treatment Patient Details Name: Warren Ingram MRN: [id number] DOB: 12/25/1940 Today's Date: 12/21/2018    History of Present Illness 78 y/o male s/p L TKA 12/19/18.    PT Comments    Participated in exercises as described below.  Pt was able to stand and ambulate around unit and complete stair training with min guard/supervision.  Overall he is progressing well but continues to need verbal cues at times to increase overall safety and to walk up into walker.  No further questions or concerns regarding discharge.   Follow Up Recommendations  Follow surgeon's recommendation for DC plan and follow-up therapies     Equipment Recommendations  None recommended by PT    Recommendations for Other Services       Precautions / Restrictions Precautions Precautions: Fall;Knee Restrictions Weight Bearing Restrictions: Yes LLE Weight Bearing: Weight bearing as tolerated    Mobility  Bed Mobility Overal bed mobility: Modified Independent                Transfers Overall transfer level: Modified independent Equipment used: Rolling walker (2 wheeled)             General transfer comment: verbal cues for safety but overall does well.  Ambulation/Gait Ambulation/Gait assistance: Min guard Gait Distance (Feet): 200 Feet Assistive device: Rolling walker (2 wheeled) Gait Pattern/deviations: Step-through pattern Gait velocity: decreased   General Gait Details: some verbal cues for safety and walker positioning and to walk up into walker.   Stairs Stairs: Yes Stairs assistance: Min guard Stair Management: One rail Right Number of Stairs: 4 General stair comments: with ease   Wheelchair Mobility    Modified Rankin (Stroke Patients Only)       Balance Overall balance assessment: Modified Independent                                          Cognition Arousal/Alertness: Awake/alert Behavior During Therapy: WFL for tasks  assessed/performed Overall Cognitive Status: Within Functional Limits for tasks assessed                                        Exercises Total Joint Exercises Ankle Circles/Pumps: AROM;10 reps Quad Sets: Strengthening;10 reps Short Arc Quad: Strengthening;10 reps Heel Slides: AROM;15 reps Straight Leg Raises: Strengthening;10 reps Long Arc Quad: AROM;10 reps;Seated Knee Flexion: 5 reps;PROM;Seated Goniometric ROM: 5-85    General Comments        Pertinent Vitals/Pain Pain Assessment: 0-10 Pain Score: 3  Pain Location: L knee pain increases significantly with ROM tasks Pain Descriptors / Indicators: Aching;Operative site guarding Pain Intervention(s): Limited activity within patient's tolerance;Monitored during session;Repositioned;Ice applied    Home Living                      Prior Function            PT Goals (current goals can now be found in the care plan section) Progress towards PT goals: Progressing toward goals    Frequency    BID      PT Plan Current plan remains appropriate    Co-evaluation              AM-PAC PT "6 Clicks" Mobility   Outcome Measure  Help needed turning from your back to  your side while in a flat bed without using bedrails?: None Help needed moving from lying on your back to sitting on the side of a flat bed without using bedrails?: None Help needed moving to and from a bed to a chair (including a wheelchair)?: None Help needed standing up from a chair using your arms (e.g., wheelchair or bedside chair)?: A Little Help needed to walk in hospital room?: A Little Help needed climbing 3-5 steps with a railing? : A Little 6 Click Score: 21    End of Session Equipment Utilized During Treatment: Gait belt Activity Tolerance: Patient tolerated treatment well;Patient limited by pain Patient left: with call bell/phone within reach;in chair;with chair alarm set;with nursing/sitter in room   Pain -  Right/Left: Left Pain - part of body: Knee     Time: 6151-8343 PT Time Calculation (min) (ACUTE ONLY): 18 min  Charges:  $Gait Training: 8-22 mins                     [name], PTA 12/21/18, 10:09 AM

## 2018-12-21 NOTE — Progress Notes (Signed)
Order rec'd to re-insert indwelling foley cath.  Patient discharge to home cancelled for today due to inability to void.

## 2018-12-22 DIAGNOSIS — M1712 Unilateral primary osteoarthritis, left knee: Secondary | ICD-10-CM | POA: Diagnosis not present

## 2018-12-22 LAB — BASIC METABOLIC PANEL
Anion gap: 8 (ref 5–15)
BUN: 15 mg/dL (ref 8–23)
CO2: 28 mmol/L (ref 22–32)
Calcium: 9 mg/dL (ref 8.9–10.3)
Chloride: 101 mmol/L (ref 98–111)
Creatinine, Ser: 0.88 mg/dL (ref 0.61–1.24)
GFR calc Af Amer: >60 mL/min (ref >60)
GFR calc non Af Amer: >60 mL/min (ref >60)
Glucose, Bld: 128 mg/dL — ABNORMAL HIGH (ref 70–99)
Potassium: 4.4 mmol/L (ref 3.5–5.1)
Sodium: 137 mmol/L (ref 135–145)

## 2018-12-22 MED ORDER — TAMSULOSIN HCL 0.4 MG PO CAPS: 0.8000 mg | ORAL_CAPSULE | Freq: Every day | ORAL | 1 refills | Status: DC

## 2018-12-22 NOTE — Progress Notes (Signed)
Patient ready for d/c off unit to front entrance for wife to pick up.  All personal belongings with patient.  While waiting for wife of patient to arrive, this scriber received a phone call from unit charge nurse stating that granddaughter of patient was on the phone and was wanting to know why the pt was being discharged home with a foley. Granddaughter stated that she was told patient was given 10 minutes to try to void before foley was reinserted. Patient is alert and oriented. Granddaughter is not listed as a person of contact. No password was established. Wife is next of kin and contact person. This conversation with the charge nurse was taking place while this scriber was getting patient settled in the vehicle. This scriber asked the patient if he wanted the granddaughter informed about his medical stay and discharge instructions, patient stated "no", I'll deal with her when I get home. Wife of patient stated for scriber to "hang up on her". Granddaughter persisted with the CN on the phone stating that "we were not following protocol". Granddaughter stated that she was a Marine scientist and she knew protocol. Granddaughter wanted to know who was available for her to get information from. CN explained that according to HIPPA and the fact that the patient and contact person were both alert and oriented , that they would be the only people for her to gain information thru.  Granddaughter then requested the name of the unit manager so that she could contact her. This information was provided by the CN.

## 2018-12-22 NOTE — Progress Notes (Signed)
  Subjective: 3 Days Post-Op Procedure(s) (LRB): TOTAL KNEE ARTHROPLASTY (Left) Patient reports pain as 6 on 0-10 scale.  States that it is much better compared to yesterday. Plan is to go Home after hospital stay.  Urinary retention.  Foley catheter inserted yesterday.  Only able to void 25 cc. Negative for chest pain and shortness of breath Fever: no Gastrointestinal:Negative for nausea and vomiting  Objective: Vital signs in last 24 hours: Temp:  [98.6 F (37 C)-98.8 F (37.1 C)] 98.6 F (37 C) (07/19 0007) Pulse Rate:  [86-92] 92 (07/19 0007) Resp:  [16-18] 16 (07/19 0007) BP: (142-170)/(60-70) 143/61 (07/19 0007) SpO2:  [96 %-100 %] 96 % (07/19 0007)  Intake/Output from previous day:  Intake/Output Summary (Last 24 hours) at 12/22/2018 0704 Last data filed at 12/22/2018 0500 Gross per 24 hour  Intake 900 ml  Output 5600 ml  Net -4700 ml    Intake/Output this shift: No intake/output data recorded.  Labs: Recent Labs    12/20/18 0420 12/21/18 0449  HGB 11.0* 11.9*   Recent Labs    12/20/18 0420 12/21/18 0449  WBC 8.7 10.2  RBC 4.21* 4.52  HCT 35.0* 36.7*  PLT 175 196   Recent Labs    12/21/18 0449 12/22/18 0526  NA 138 137  K 4.2 4.4  CL 104 101  CO2 26 28  BUN 12 15  CREATININE 0.82 0.88  GLUCOSE 135* 128*  CALCIUM 9.0 9.0   No results for input(s): LABPT, INR in the last 72 hours.   EXAM General - Patient is Alert, Appropriate and Oriented Extremity - Sensation intact distally Intact pulses distally Dorsiflexion/Plantar flexion intact Incision: dressing C/D/I No cellulitis present  Negative Homans to the left leg. Reports chronic decreased sensation to the left leg following surgery years ago, intact to light touch this morning. Dressing/Incision - clean, dry, no drainage.  Ace wrap removed. Motor Function - intact, moving foot and toes well on exam.  Abdomen with normal bowel sounds but moderate distention this morning.  Past Medical  History:  Diagnosis Date  . Arthritis   . Barrett esophagus   . Benign neoplasm of abdomen   . Benign neoplasm of colon   . Cancer ([place])    skin cancer  . Coronary artery disease   . GERD (gastroesophageal reflux disease)   . Hypercholesterolemia   . Hypothyroidism   . Stroke ([place]) 2015   mini stroke    Assessment/Plan: 3 Days Post-Op Procedure(s) (LRB): TOTAL KNEE ARTHROPLASTY (Left) Active Problems:   Status post total knee replacement using cement, left  Estimated body mass index is 29.86 kg/m as calculated from the following:   Height as of this encounter: 5\' 6"  (1.676 m).   Weight as of this encounter: 83.9 kg. Advance diet Up with therapy D/C IV fluids when tolerating po intake.  BP improving today, continue per internal med recommendations. Up with therapy today. Continue working on [employer], patient states he is passing gas without pain. Plan for d/c home today pending progress made today.  After Foley removal if patient is able to void independently.  Experiencing urinary retention.  Discharge was held yesterday.  DVT Prophylaxis - Lovenox, Foot Pumps and TED hose Weight-Bearing as tolerated to left leg  Reche Dixon, PA-C [facility] 12/22/2018, 7:04 AM

## 2018-12-22 NOTE — Progress Notes (Signed)
PT Cancellation Note  Patient Details Name: Warren Ingram MRN: [id number] DOB: 1940-06-13   Cancelled Treatment:    Reason Eval/Treat Not Completed: Other (comment)   Pt in bed this pm awaiting discharge home.  Stated he is leaving soon and just awaiting his ride.  Since discharge is imminent, session held this pm.   [name] 12/22/2018, 1:22 PM

## 2018-12-22 NOTE — Progress Notes (Signed)
Physical Therapy Treatment Patient Details Name: Warren Ingram MRN: [id number] DOB: 08-Aug-1940 Today's Date: 12/22/2018    History of Present Illness 78 y/o male s/p L TKA 12/19/18.    PT Comments    Discharge held yesterday.  Participated in exercises as described below.  Stood and was able to ambulate around unit with rw and min guard.  Some increased difficulty with mobility today which he attributes to stiffness and pain but still only +1 assist.  Pt reports feeling comfortable with discharge home.   Will plan to see BID today if discharge is held again today since yesterday he only received 1 session.   Follow Up Recommendations  Follow surgeon's recommendation for DC plan and follow-up therapies     Equipment Recommendations  Rolling walker with 5" wheels    Recommendations for Other Services       Precautions / Restrictions Precautions Precautions: Fall;Knee Restrictions Weight Bearing Restrictions: Yes LLE Weight Bearing: Weight bearing as tolerated    Mobility  Bed Mobility Overal bed mobility: Needs Assistance Bed Mobility: Supine to Sit     Supine to sit: Min assist        Transfers Overall transfer level: Needs assistance Equipment used: Rolling walker (2 wheeled) Transfers: Sit to/from Stand Sit to Stand: Min guard         General transfer comment: verbal cues for safety but overall does well.  Ambulation/Gait Ambulation/Gait assistance: Min guard Gait Distance (Feet): 200 Feet Assistive device: Rolling walker (2 wheeled) Gait Pattern/deviations: Step-to pattern Gait velocity: decreased   General Gait Details: some verbal cues for safety and walker positioning and to walk up into walker.   Stairs         General stair comments: stated he was comfortable with training yesterday   Wheelchair Mobility    Modified Rankin (Stroke Patients Only)       Balance Overall balance assessment: Needs assistance Sitting-balance  support: Feet supported Sitting balance-Leahy Scale: Good     Standing balance support: Bilateral upper extremity supported Standing balance-Leahy Scale: Fair Standing balance comment: some increased relainace on walker today attributed to stiffness                            Cognition Arousal/Alertness: Awake/alert Behavior During Therapy: WFL for tasks assessed/performed Overall Cognitive Status: Within Functional Limits for tasks assessed                                        Exercises Total Joint Exercises Ankle Circles/Pumps: AROM;10 reps Quad Sets: Strengthening;10 reps Short Arc Quad: Strengthening;10 reps Heel Slides: AROM;15 reps Hip ABduction/ADduction: Strengthening;10 reps Straight Leg Raises: Strengthening;10 reps Long Arc Quad: AROM;10 reps;Seated Knee Flexion: 5 reps;PROM;Seated Goniometric ROM: 3-88    General Comments        Pertinent Vitals/Pain Pain Assessment: Faces Faces Pain Scale: Hurts even more Pain Location: L knee pain increases with ROM Pain Descriptors / Indicators: Aching;Operative site guarding    Home Living                      Prior Function            PT Goals (current goals can now be found in the care plan section) Progress towards PT goals: Progressing toward goals    Frequency    BID  PT Plan Current plan remains appropriate    Co-evaluation              AM-PAC PT "6 Clicks" Mobility   Outcome Measure  Help needed turning from your back to your side while in a flat bed without using bedrails?: None Help needed moving from lying on your back to sitting on the side of a flat bed without using bedrails?: A Little Help needed moving to and from a bed to a chair (including a wheelchair)?: None Help needed standing up from a chair using your arms (e.g., wheelchair or bedside chair)?: A Little Help needed to walk in hospital room?: A Little Help needed climbing 3-5 steps  with a railing? : A Little 6 Click Score: 20    End of Session Equipment Utilized During Treatment: Gait belt Activity Tolerance: Patient tolerated treatment well Patient left: with call bell/phone within reach;in chair;with chair alarm set   Pain - Right/Left: Left Pain - part of body: Knee     Time: 1103-1594 PT Time Calculation (min) (ACUTE ONLY): 19 min  Charges:  $Gait Training: 8-22 mins                    [name], PTA 12/22/18, 10:50 AM

## 2018-12-22 NOTE — Progress Notes (Signed)
Indwelling foley cath discontinued per MD order. Patient encouraged to force fluids and ring for assistance in standing to attemp voids.  Will continue to observe for voiding status.  Foley cath emptied at 400 mls clear yellow urine.

## 2018-12-22 NOTE — Progress Notes (Signed)
Foley cath discontinued per physician order for voiding trial pending discharge to home today.  Patient encouraged to drink fluids. Water pitcher and cranberry juice provided for patient to drink.  Patient agreeable .

## 2018-12-22 NOTE — Progress Notes (Signed)
In to assist pt for stand to void.   Patient unable to void. Advised patient that we would wait until 1300 to retry to void unless he felt he needed to try sooner.  Dr. Theodore Demark message to nursing and patient was that if patient could not void by 1300 to attempt void once more. If unsuccessful, place indwelling foley cath and discharge patient home with catheter. Dr. Rudene Christians advice was for patient to follow up with urology tomorrow for appt. Name of [name] was mentioned by Dr. Rudene Christians per patient.

## 2018-12-22 NOTE — Progress Notes (Signed)
Patient remains unable to void. Per MD verbal order, indwelling foley cath replaced maintaining sterile technique. Immediate return of light yellow urine of 300 mls. Foley cath secured with leg secure.  Instructions on foley care, soap and water wash twice a day given to patient as well as instruction on emptying foley bag once foley  Bag reaches about 1/2 full. Wife also received these instructions.   Patient and wife advised on making appointment first thing Monday AM with urology. Patient has been on Flomax at home and will continue as a d/c med.

## 2018-12-30 ENCOUNTER — Ambulatory Visit (INDEPENDENT_AMBULATORY_CARE_PROVIDER_SITE_OTHER): Payer: Medicare PPO | Admitting: Urology

## 2018-12-30 ENCOUNTER — Encounter: Payer: Self-pay | Admitting: Urology

## 2018-12-30 ENCOUNTER — Other Ambulatory Visit: Payer: Self-pay

## 2018-12-30 VITALS — BP 162/69 | HR 85 | Ht 66.0 in | Wt 185.0 lb

## 2018-12-30 DIAGNOSIS — R339 Retention of urine, unspecified: Secondary | ICD-10-CM | POA: Diagnosis not present

## 2018-12-30 LAB — BLADDER SCAN AMB NON-IMAGING

## 2018-12-30 MED ORDER — TAMSULOSIN HCL 0.4 MG PO CAPS: 0.8000 mg | ORAL_CAPSULE | Freq: Every day | ORAL | 1 refills | Status: DC

## 2018-12-30 NOTE — Addendum Note (Signed)
Addended by: Verlene Mayer A on: 12/30/2018 03:13 PM   Modules accepted: Orders

## 2018-12-30 NOTE — Progress Notes (Signed)
12/30/2018 8:22 AM   Warren Ingram 12-18-1940 [id number]  Referring provider: Idelle Crouch, MD [place] [facility] [place],  [place] [address]  No chief complaint on file.   HPI: I was consulted to assess the patient's postoperative urinary retention.  He had a knee replacement in the hospital and was constipated.  He cannot void.  He has a catheter.  Prior to this his flow is reasonable.  He would get up 2 or 3 times a night and void every 2 hours during the day.  He has had a TIA.  He was started on Flomax in the hospital  Bowel movements now normal.  No previous GU surgery or history of kidney stones or bladder infections  Modifying factors: There are no other modifying factors  Associated signs and symptoms: There are no other associated signs and symptoms Aggravating and relieving factors: There are no other aggravating or relieving factors Severity: Moderate Duration: Persistent   PMH: Past Medical History:  Diagnosis Date  . Arthritis   . Barrett esophagus   . Benign neoplasm of abdomen   . Benign neoplasm of colon   . Cancer ([place])    skin cancer  . Coronary artery disease   . GERD (gastroesophageal reflux disease)   . Hypercholesterolemia   . Hypothyroidism   . Stroke [facility]) 2015   mini stroke    Surgical History: Past Surgical History:  Procedure Laterality Date  . CARDIAC CATHETERIZATION    . COLONOSCOPY WITH PROPOFOL N/A 01/01/2018   Procedure: COLONOSCOPY WITH PROPOFOL;  Surgeon: Jonathon Bellows, MD;  Location: [facility] ENDOSCOPY;  Service: Gastroenterology;  Laterality: N/A;  . CORONARY ARTERY BYPASS GRAFT  08/2017   4 vessels  . ESOPHAGOGASTRODUODENOSCOPY (EGD) WITH PROPOFOL N/A 10/28/2015   Procedure: ESOPHAGOGASTRODUODENOSCOPY (EGD) WITH PROPOFOL;  Surgeon: Hulen Luster, MD;  Location: [facility] ENDOSCOPY;  Service: Gastroenterology;  Laterality: N/A;  . KNEE ARTHROSCOPY    . LEFT HEART CATH AND CORONARY ANGIOGRAPHY N/A 07/25/2017   Procedure: LEFT HEART CATH AND CORONARY ANGIOGRAPHY;  Surgeon: Teodoro Spray, MD;  Location: [place] CV LAB;  Service: Cardiovascular;  Laterality: N/A;  . 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 If you have any questions, ask your nurse or doctor.        acetaminophen 500 MG tablet Commonly known as: TYLENOL Take 500 mg by mouth at bedtime as needed for moderate pain or headache.   aspirin EC 81 MG EC tablet Generic drug: aspirin Take 81 mg by mouth daily. Swallow whole.   atorvastatin 80 MG tablet Commonly known as: LIPITOR Take 80 mg by mouth at bedtime.   enoxaparin 40 MG/0.4ML injection Commonly known as: LOVENOX Inject 0.4 mLs (40 mg total) into the skin daily.   FISH OIL PO Take 1 capsule by mouth 2 (two) times a day.   fluticasone 50 MCG/ACT nasal spray Commonly known as: FLONASE Place 1 spray into both nostrils 2 (two) times daily as needed.   hydrALAZINE 25 MG tablet Commonly known as: APRESOLINE Take 1 tablet (25 mg total) by mouth every 8 (eight) hours.   levothyroxine 150 MCG  tablet Commonly known as: SYNTHROID Take 150 mcg by mouth daily before breakfast.   meloxicam 15 MG tablet Commonly known as: MOBIC Take 15 mg by mouth as needed.   metoprolol tartrate 25 MG tablet Commonly known as: LOPRESSOR Take 25 mg by mouth 2 (two) times daily.   omeprazole 20 MG capsule Commonly known as: PRILOSEC Take 20 mg by mouth every morning.   oxyCODONE 5 MG immediate release tablet Commonly known as: Oxy IR/ROXICODONE Take 1-2 tablets (5-10 mg total) by mouth every 4 (four) hours as needed for moderate pain.   tamsulosin 0.4 MG Caps capsule Commonly known as: FLOMAX Take  2 capsules (0.8 mg total) by mouth at bedtime.   telmisartan 40 MG tablet Commonly known as: MICARDIS Take 40 mg by mouth every morning.   traMADol 50 MG tablet Commonly known as: ULTRAM Take 1 tablet (50 mg total) by mouth every 6 (six) hours as needed for moderate pain.       Allergies: No Known Allergies  Family History: Family History  Problem Relation Age of Onset  . Heart attack Mother   . Lung disease Father     Social History:  reports that he quit smoking about 47 years ago. He has a 15.00 pack-year smoking history. He has quit using smokeless tobacco. He reports current alcohol use of about 1.0 standard drinks of alcohol per week. He reports that he does not use drugs.  ROS:                                        Physical Exam: BP (!) 162/69   Pulse 85   Ht 5\' 6"  (1.676 m)   Wt 185 lb (83.9 kg)   BMI 29.86 kg/m   Constitutional:  Alert and oriented, No acute distress. HEENT: [place] AT, moist mucus membranes.  Trachea midline, no masses. Cardiovascular: No clubbing, cyanosis, or edema. Respiratory: Normal respiratory effort, no increased work of breathing. GI: Abdomen is soft, nontender, nondistended, no abdominal masses GU: No CVA tenderness.  50 g benign prostate Skin: No rashes, bruises or suspicious lesions. Lymph: No cervical or inguinal adenopathy. Neurologic: Grossly intact, no focal deficits, moving all 4 extremities. Psychiatric: Normal mood and affect.  Laboratory Data: Lab Results  Component Value Date   WBC 10.2 12/21/2018   HGB 11.9 (L) 12/21/2018   HCT 36.7 (L) 12/21/2018   MCV 81.2 12/21/2018   PLT 196 12/21/2018    Lab Results  Component Value Date   CREATININE 0.88 12/22/2018    No results found for: PSA  No results found for: TESTOSTERONE  No results found for: HGBA1C  Urinalysis    Component Value Date/Time   COLORURINE YELLOW (A) 12/12/2018 1407   APPEARANCEUR CLEAR (A) 12/12/2018 1407   LABSPEC  1.019 12/12/2018 1407   PHURINE 6.0 12/12/2018 1407   GLUCOSEU 150 (A) 12/12/2018 1407   HGBUR NEGATIVE 12/12/2018 1407   BILIRUBINUR NEGATIVE 12/12/2018 1407   KETONESUR NEGATIVE 12/12/2018 1407   PROTEINUR NEGATIVE 12/12/2018 1407   NITRITE NEGATIVE 12/12/2018 1407   LEUKOCYTESUR NEGATIVE 12/12/2018 1407    Pertinent Imaging:   Assessment & Plan: Pathophysiology discussed.  Trial of voiding discussed.  Return in afternoon for residual  Voided only a small amount.  Scanned for over 500 mL.  16 French Foley catheter inserted utilizing sterile technique.  Double his Flomax and give second trial of voiding in  1 week  There are no diagnoses linked to this encounter.  No follow-ups on file.  Reece Packer, MD  [facility] [address], [place], [place] [address] [phone]

## 2018-12-30 NOTE — Addendum Note (Signed)
Addended by: Verlene Mayer A on: 12/30/2018 03:10 PM   Modules accepted: Orders

## 2018-12-30 NOTE — Progress Notes (Addendum)
Fill and Pull Catheter Removal  Patient is present today for a catheter removal.  Patient was cleaned and prepped in a sterile fashion 372ml of sterile water/ saline was instilled into the bladder when the patient felt the urge to urinate. 41ml of water was then drained from the balloon.  A 16FR foley cath was removed from the bladder no complications were noted .  Patient as then given some time to void on their own.  Patient cannot void on their own after some time.  Patient tolerated well. Instructed to hydrate and return this afternoon.   Preformed by: Verlene Mayer, CMA  Follow up/ Additional notes: Return this afternoon for PVR   Simple Catheter Placement  Due to urinary retention patient is present today for a foley cath placement.  Patient was cleaned and prepped in a sterile fashion with betadine and lidocaine jelly 2% was instilled into the urethra.  A 16FR foley catheter was inserted, urine return was noted  611ml, urine was yellow in color.  The balloon was filled with 10cc of sterile water.  A leg bag was attached for drainage. Patient was also given a night bag to take home and was given instruction on how to change from one bag to another.  Patient was given instruction on proper catheter care.  Patient tolerated well, no complications were noted   Preformed by: Rebeca Morris,CMA  Additional notes/ Follow up: One week Voiding trial and afternoon appointment with Dr. Matilde Sprang

## 2019-01-03 ENCOUNTER — Telehealth: Payer: Self-pay | Admitting: Urology

## 2019-01-03 NOTE — Telephone Encounter (Signed)
Pt has been taking 1 Tamsulosin at bedtime.  The wife wants to know if he should be taking 1 or 2.

## 2019-01-03 NOTE — Telephone Encounter (Signed)
Spoke with patient and notified him per Dr. Mikle Bosworth note he should take two tablets daily

## 2019-01-06 ENCOUNTER — Ambulatory Visit: Payer: Medicare PPO | Admitting: *Deleted

## 2019-01-06 ENCOUNTER — Other Ambulatory Visit: Payer: Self-pay

## 2019-01-06 ENCOUNTER — Ambulatory Visit: Payer: Medicare PPO | Admitting: Urology

## 2019-01-06 ENCOUNTER — Encounter: Payer: Self-pay | Admitting: Urology

## 2019-01-06 VITALS — BP 142/68 | HR 66 | Ht 66.0 in

## 2019-01-06 DIAGNOSIS — R339 Retention of urine, unspecified: Secondary | ICD-10-CM | POA: Diagnosis not present

## 2019-01-06 LAB — BLADDER SCAN AMB NON-IMAGING

## 2019-01-06 NOTE — Progress Notes (Signed)
01/06/2019 1:48 PM   Bertha Stakes Mar 03, 1941 [id number]  Referring provider: Idelle Crouch, MD [place] [facility] Warren Ingram,  [place] [address]  No chief complaint on file.   HPI: I was consulted to assess the patient's postoperative urinary retention.  He had a knee replacement in the hospital and was constipated.  He cannot void.  He has a catheter.  Prior to this his flow is reasonable.  He would get up 2 or 3 times a night and void every 2 hours during the day.  He has had a TIA.  He was started on Flomax in the hospital  Today Frequency stable.  Voiding with good flow.  On Flomax 0.8 mg.  Scanned for 109 mL.  50 g benign prostate  Today  Patient had failed trial of voiding on July 27 and I doubled the dose of his Flomax.     PMH: Past Medical History:  Diagnosis Date  . Arthritis   . Barrett esophagus   . Benign neoplasm of abdomen   . Benign neoplasm of colon   . Cancer ([place])    skin cancer  . Coronary artery disease   . GERD (gastroesophageal reflux disease)   . Hypercholesterolemia   . Hypothyroidism   . Stroke [facility]) 2015   mini stroke    Surgical History: Past Surgical History:  Procedure Laterality Date  . CARDIAC CATHETERIZATION    . COLONOSCOPY WITH PROPOFOL N/A 01/01/2018   Procedure: COLONOSCOPY WITH PROPOFOL;  Surgeon: Jonathon Bellows, MD;  Location: [facility] ENDOSCOPY;  Service: Gastroenterology;  Laterality: N/A;  . CORONARY ARTERY BYPASS GRAFT  08/2017   4 vessels  . ESOPHAGOGASTRODUODENOSCOPY (EGD) WITH PROPOFOL N/A 10/28/2015   Procedure: ESOPHAGOGASTRODUODENOSCOPY (EGD) WITH PROPOFOL;  Surgeon: Hulen Luster, MD;  Location: [facility] ENDOSCOPY;  Service: Gastroenterology;  Laterality: N/A;  . KNEE ARTHROSCOPY    . LEFT HEART CATH AND CORONARY ANGIOGRAPHY N/A 07/25/2017   Procedure: LEFT HEART CATH AND CORONARY ANGIOGRAPHY;  Surgeon: Teodoro Spray, MD;  Location: [place] CV LAB;  Service: Cardiovascular;  Laterality:  N/A;  . 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 If you have any questions, ask your nurse or doctor.        acetaminophen 500 MG tablet Commonly known as: TYLENOL Take 500 mg by mouth at bedtime as needed for moderate pain or headache.   aspirin EC 81 MG EC tablet Generic drug: aspirin Take 81 mg by mouth daily. Swallow whole.   atorvastatin 80 MG tablet Commonly known as: LIPITOR Take 80 mg by mouth at bedtime.   enoxaparin 40 MG/0.4ML injection Commonly known as: LOVENOX Inject 0.4 mLs (40 mg total) into the skin daily.   FISH OIL PO Take 1 capsule by mouth 2 (two) times a day.   fluticasone 50 MCG/ACT nasal spray Commonly known as: FLONASE Place 1 spray into both nostrils 2 (two) times daily as needed.   hydrALAZINE 25 MG tablet Commonly known as: APRESOLINE Take 1 tablet (25 mg total) by mouth every 8 (eight) hours.   levothyroxine 150 MCG tablet Commonly known as: SYNTHROID Take 150 mcg by  mouth daily before breakfast.   meloxicam 15 MG tablet Commonly known as: MOBIC Take 15 mg by mouth as needed.   metoprolol tartrate 25 MG tablet Commonly known as: LOPRESSOR Take 25 mg by mouth 2 (two) times daily.   omeprazole 20 MG capsule Commonly known as: PRILOSEC Take 20 mg by mouth every morning.   oxyCODONE 5 MG immediate release tablet Commonly known as: Oxy IR/ROXICODONE Take 1-2 tablets (5-10 mg total) by mouth every 4 (four) hours as needed for moderate pain.   tamsulosin 0.4 MG Caps capsule Commonly known as: FLOMAX Take 2 capsules (0.8 mg total) by mouth at bedtime.   telmisartan 40 MG tablet Commonly known as: MICARDIS Take 40 mg by mouth every morning.   traMADol  50 MG tablet Commonly known as: ULTRAM Take 1 tablet (50 mg total) by mouth every 6 (six) hours as needed for moderate pain.       Allergies: No Known Allergies  Family History: Family History  Problem Relation Age of Onset  . Heart attack Mother   . Lung disease Father     Social History:  reports that he quit smoking about 47 years ago. He has a 15.00 pack-year smoking history. He has quit using smokeless tobacco. He reports current alcohol use of about 1.0 standard drinks of alcohol per week. He reports that he does not use drugs.  ROS:                                        Physical Exam: There were no vitals taken for this visit.  Constitutional:  Alert and oriented, No acute distress. HEENT: [place] AT, moist mucus membranes.  Trachea midline, no masses. Cardiovascular: No clubbing, cyanosis, or edema.   Laboratory Data: Lab Results  Component Value Date   WBC 10.2 12/21/2018   HGB 11.9 (L) 12/21/2018   HCT 36.7 (L) 12/21/2018   MCV 81.2 12/21/2018   PLT 196 12/21/2018    Lab Results  Component Value Date   CREATININE 0.88 12/22/2018    No results found for: PSA  No results found for: TESTOSTERONE  No results found for: HGBA1C  Urinalysis    Component Value Date/Time   COLORURINE YELLOW (A) 12/12/2018 1407   APPEARANCEUR CLEAR (A) 12/12/2018 1407   LABSPEC 1.019 12/12/2018 1407   PHURINE 6.0 12/12/2018 1407   GLUCOSEU 150 (A) 12/12/2018 1407   HGBUR NEGATIVE 12/12/2018 1407   [place] NEGATIVE 12/12/2018 1407   KETONESUR NEGATIVE 12/12/2018 1407   PROTEINUR NEGATIVE 12/12/2018 1407   NITRITE NEGATIVE 12/12/2018 1407   LEUKOCYTESUR NEGATIVE 12/12/2018 1407    Pertinent Imaging:   Assessment & Plan: I will see him in 8 weeks and check a postvoid residual and do a prostate examination deferred for mobility issues last time  There are no diagnoses linked to this encounter.  No follow-ups on file.  Reece Packer, MD   [place] [address], [place], [place] [address] [phone]

## 2019-01-06 NOTE — Progress Notes (Signed)
Fill and Pull Catheter Removal  Patient is present today for a catheter removal.  Patient was cleaned and prepped in a sterile fashion 179ml of sterile water/ saline was instilled into the bladder when the patient felt the urge to urinate. 86ml of water was then drained from the balloon.  A 16FR foley cath was removed from the bladder no complications were noted .  Patient as then given some time to void on their own.  Patient can void  176ml on their own after some time.  Patient tolerated well.  Preformed by: Blondell Reveal  Follow up/ Additional notes: Return this afternoon with Dr. Matilde Sprang

## 2019-05-21 ENCOUNTER — Other Ambulatory Visit: Payer: Self-pay | Admitting: Surgery

## 2019-05-21 DIAGNOSIS — M25561 Pain in right knee: Secondary | ICD-10-CM

## 2019-05-21 DIAGNOSIS — S83231A Complex tear of medial meniscus, current injury, right knee, initial encounter: Secondary | ICD-10-CM

## 2019-05-21 DIAGNOSIS — M1711 Unilateral primary osteoarthritis, right knee: Secondary | ICD-10-CM

## 2019-06-04 ENCOUNTER — Ambulatory Visit
Admission: RE | Admit: 2019-06-04 | Discharge: 2019-06-04 | Disposition: A | Discharge status: Home / Self Care | Payer: Medicare PPO | Source: Ambulatory Visit | Attending: Surgery | Admitting: Surgery

## 2019-06-04 ENCOUNTER — Other Ambulatory Visit: Payer: Self-pay

## 2019-06-04 DIAGNOSIS — S83231A Complex tear of medial meniscus, current injury, right knee, initial encounter: Secondary | ICD-10-CM

## 2019-06-04 DIAGNOSIS — M1711 Unilateral primary osteoarthritis, right knee: Secondary | ICD-10-CM | POA: Insufficient documentation

## 2019-06-04 DIAGNOSIS — M25561 Pain in right knee: Secondary | ICD-10-CM

## 2019-06-23 ENCOUNTER — Other Ambulatory Visit: Payer: Self-pay | Admitting: Surgery

## 2019-06-24 ENCOUNTER — Encounter
Admission: RE | Admit: 2019-06-24 | Discharge: 2019-06-24 | Disposition: A | Discharge status: Home / Self Care | Payer: Medicare PPO | Source: Ambulatory Visit | Attending: Surgery | Admitting: Surgery

## 2019-06-24 HISTORY — DX: Cardiac murmur, unspecified: R01.1

## 2019-06-24 HISTORY — DX: Personal history of other diseases of the digestive system: Z87.19

## 2019-06-24 NOTE — Patient Instructions (Addendum)
Your procedure is scheduled on: 06-27-19 FRIDAY Report to Same Day Surgery 2nd floor medical mall [facility] Entrance-take elevator on left to 2nd floor.  Check in with surgery information desk.) To find out your arrival time please call [phone] between 1PM - 3PM on 06-26-19 THURSDAY  Remember: Instructions that are not followed completely may result in serious medical risk, up to and including death, or upon the discretion of your surgeon and anesthesiologist your surgery may need to be rescheduled.    _x___ 1. Do not eat food after midnight the night before your procedure. NO GUM OR CANDY AFTER MIDNIGHT. You may drink clear liquids up to 2 hours before you are scheduled to arrive at the hospital for your procedure.  Do not drink clear liquids within 2 hours of your scheduled arrival to the hospital.  Clear liquids include  --Water or Apple juice without pulp  --Gatorade  --Black Coffee or Clear Tea (No milk, no creamers, do not add anything to the coffee or Tea   ____Ensure clear carbohydrate drink on the way to the hospital for bariatric patients  _X___Ensure clear carbohydrate drink 3 hours PRIOR TO ARRIVAL TIME-IF ARRIVAL TIME IS 6 AM, FINISH ENSURE BY 4:30 AM-OTHERWISE, FINISH ENSURE 3 HOURS PRIOR TO ARRIVAL TIME TO HOSPITAL    __x__ 2. No Alcohol for 24 hours before or after surgery.   __x__3. No Smoking or e-cigarettes for 24 prior to surgery.  Do not use any chewable tobacco products for at least 6 hour prior to surgery   ____  4. Bring all medications with you on the day of surgery if instructed.    __x__ 5. Notify your doctor if there is any change in your medical condition     (cold, fever, infections).    x___6. On the morning of surgery brush your teeth with toothpaste and water.  You may rinse your mouth with mouth wash if you wish.  Do not swallow any toothpaste or mouthwash.   Do not wear jewelry, make-up, hairpins, clips or nail polish.  Do not wear lotions,  powders, or perfumes.  Do not shave 48 hours prior to surgery. Men may shave face and neck.  Do not bring valuables to the hospital.    [facility] is not responsible for any belongings or valuables.               Contacts, dentures or bridgework may not be worn into surgery.  Leave your suitcase in the car. After surgery it may be brought to your room.  For patients admitted to the hospital, discharge time is determined by your treatment team.  _  Patients discharged the day of surgery will not be allowed to drive home.  You will need someone to drive you home and stay with you the night of your procedure.    Please read over the following fact sheets that you were given:   [facility] Preparing for Surgery and or MRSA Information   _x___ TAKE THE FOLLOWING MEDICATION THE MORNING OF SURGERY WITH A SMALL SIP OF WATER. These include:  1. SYNTHROID (LEVOTHYROXINE)  2. METOPROLOL (TOPROL)  3. PRILOSEC (OMEPRAZOLE)  4. TAKE AN EXTRA PRILOSEC THE NIGHT BEFORE SURGERY  5.  6.  ____Fleets enema or Magnesium Citrate as directed.   _x___ Use CHG Soap or sage wipes as directed on instruction sheet   ____ Use inhalers on the day of surgery and bring to hospital day of surgery  ____ Stop Metformin and  Janumet 2 days prior to surgery.    ____ Take 1/2 of usual insulin dose the night before surgery and none on the morning surgery.   _x___ Follow recommendations from Cardiologist, Pulmonologist or PCP regarding  stopping Aspirin, Coumadin, Plavix ,Eliquis, Effient, or Pradaxa, and Pletal-CALL DR POGGI AND ASK HIM ABOUT WHEN TO STOP YOUR ASPIRIN  X____Stop Anti-inflammatories such as Advil, Aleve, Ibuprofen, Motrin, Naproxen, Naprosyn, Goodies powders or aspirin products NOW-OK to take Tylenol    _x___ Stop supplements until after surgery-STOP FISH OIL NOW-MAY RESUME AFTER SURGERY   _X___ Bring C-Pap to the hospital.

## 2019-06-25 ENCOUNTER — Other Ambulatory Visit: Payer: Self-pay

## 2019-06-25 ENCOUNTER — Encounter
Admission: RE | Admit: 2019-06-25 | Discharge: 2019-06-25 | Disposition: A | Discharge status: Home / Self Care | Payer: Medicare PPO | Source: Ambulatory Visit | Attending: Surgery | Admitting: Surgery

## 2019-06-25 ENCOUNTER — Other Ambulatory Visit: Payer: Medicare PPO

## 2019-06-25 DIAGNOSIS — Z20822 Contact with and (suspected) exposure to covid-19: Secondary | ICD-10-CM | POA: Insufficient documentation

## 2019-06-25 DIAGNOSIS — Z01818 Encounter for other preprocedural examination: Secondary | ICD-10-CM | POA: Insufficient documentation

## 2019-06-25 DIAGNOSIS — R9431 Abnormal electrocardiogram [ECG] [EKG]: Secondary | ICD-10-CM | POA: Diagnosis not present

## 2019-06-25 DIAGNOSIS — R001 Bradycardia, unspecified: Secondary | ICD-10-CM | POA: Insufficient documentation

## 2019-06-25 LAB — TYPE AND SCREEN
ABO/RH(D): A POS
Antibody Screen: NEGATIVE

## 2019-06-25 LAB — CBC WITH DIFFERENTIAL/PLATELET
Abs Immature Granulocytes: 0.05 10*3/uL (ref 0.00–0.07)
Basophils Absolute: 0 10*3/uL (ref 0.0–0.1)
Basophils Relative: 0 %
Eosinophils Absolute: 0.2 10*3/uL (ref 0.0–0.5)
Eosinophils Relative: 2 %
HCT: 42.3 % (ref 39.0–52.0)
Hemoglobin: 13.4 g/dL (ref 13.0–17.0)
Immature Granulocytes: 1 %
Lymphocytes Relative: 18 %
Lymphs Abs: 1.4 10*3/uL (ref 0.7–4.0)
MCH: 27.1 pg (ref 26.0–34.0)
MCHC: 31.7 g/dL (ref 30.0–36.0)
MCV: 85.5 fL (ref 80.0–100.0)
Monocytes Absolute: 0.7 10*3/uL (ref 0.1–1.0)
Monocytes Relative: 9 %
Neutro Abs: 5.3 10*3/uL (ref 1.7–7.7)
Neutrophils Relative %: 70 %
Platelets: 246 10*3/uL (ref 150–400)
RBC: 4.95 MIL/uL (ref 4.22–5.81)
RDW: 17.5 % — ABNORMAL HIGH (ref 11.5–15.5)
WBC: 7.6 10*3/uL (ref 4.0–10.5)
nRBC: 0 % (ref 0.0–0.2)

## 2019-06-25 LAB — COMPREHENSIVE METABOLIC PANEL
ALT: 40 U/L (ref 0–44)
AST: 27 U/L (ref 15–41)
Albumin: 3.9 g/dL (ref 3.5–5.0)
Alkaline Phosphatase: 137 U/L — ABNORMAL HIGH (ref 38–126)
Anion gap: 9 (ref 5–15)
BUN: 16 mg/dL (ref 8–23)
CO2: 28 mmol/L (ref 22–32)
Calcium: 9.1 mg/dL (ref 8.9–10.3)
Chloride: 104 mmol/L (ref 98–111)
Creatinine, Ser: 0.94 mg/dL (ref 0.61–1.24)
GFR calc Af Amer: >60 mL/min (ref >60)
GFR calc non Af Amer: >60 mL/min (ref >60)
Glucose, Bld: 140 mg/dL — ABNORMAL HIGH (ref 70–99)
Potassium: 3.6 mmol/L (ref 3.5–5.1)
Sodium: 141 mmol/L (ref 135–145)
Total Bilirubin: 0.8 mg/dL (ref 0.3–1.2)
Total Protein: 7.9 g/dL (ref 6.5–8.1)

## 2019-06-25 LAB — URINALYSIS, ROUTINE W REFLEX MICROSCOPIC
Bilirubin Urine: NEGATIVE
Glucose, UA: NEGATIVE mg/dL
Hgb urine dipstick: NEGATIVE
Ketones, ur: NEGATIVE mg/dL
Nitrite: POSITIVE — AB
Protein, ur: 30 mg/dL — AB
Specific Gravity, Urine: 1.02 (ref 1.005–1.030)
Squamous Epithelial / HPF: NONE SEEN (ref 0–5)
WBC, UA: >50 WBC/hpf — ABNORMAL HIGH (ref 0–5)
pH: 5 (ref 5.0–8.0)

## 2019-06-25 LAB — SARS CORONAVIRUS 2 (TAT 6-24 HRS): SARS Coronavirus 2: NEGATIVE

## 2019-06-25 LAB — SURGICAL PCR SCREEN
MRSA, PCR: NEGATIVE
Staphylococcus aureus: NEGATIVE

## 2019-06-25 NOTE — Pre-Procedure Instructions (Signed)
DR Amie Critchley REVIEWED ABNORMAL EKG-PT ASYMPTOMATIC-OK TO PROCEED PER DR Amie Critchley

## 2019-06-25 NOTE — Pre-Procedure Instructions (Signed)
Progress Notes - documented in this encounter Warren Ingram, [place] - 02/17/2019 9:15 AM EDT Formatting of this note might be different from the original. Established Patient Visit   Chief Complaint: Chief Complaint  Patient presents with  . Coronary Artery Disease  Date of Service: 02/17/2019 Date of Birth: [birth year] PCP: Idelle Crouch, MD  History of Present Illness: Mr. Andreola is a 79 y.o.male patient with a past medical history significant for coronary artery disease s/p bypass at [facility] in March 2019 with a left internal mammary to the LAD, saphaneous vein graft to PDA, D1, OM1, essential hypertension, and hypercholesterolemia who presents for a follow up visit. He underwent a left TKA on 12/19/18 and has been doing well since the procedure. He had some urinary issues following surgery and is following up with urology for this. He is doing well from a cardiac standpoint and denies chest pain, palpitations, shortness of breath, lower extremity swelling, orthopnea, PND, or syncopal/presyncopal episodes. He denies any recurrent hematochezia since resuming aspirin. We discussed adding Zetia to atorvastatin 80mg  for optimal LDL control, but Mr. Kuni defers and wishes to work on dietary modifications at this time. He remains fairly active, but has gained a few pounds over the past few months due to increased caloric intake.   Echocardiogram on 08/03/17 revealed normal LV systolic function with an EF estimated greater than 55% with mild LVH, aortic sclerosis, and mild elevation in pulmonary pressures.   Past Medical and Surgical History  Past Medical History Past Medical History:  Diagnosis Date  . Barrett's esophagus 03/10/2010  06/25/2014 long segment  . Benign neoplasm of colon 08/21/2013  . Coronary atherosclerosis of native coronary artery 08/21/2013  . Diverticulitis of colon (without mention of hemorrhage)(562.11) 08/21/2013  . Esophageal reflux 08/21/2013  . Essential  hypertension, benign 08/21/2013  . Hemorrhage of rectum and anus 08/21/2013  . Osteoarthrosis, unspecified whether generalized or localized, lower leg 08/21/2013  . Osteoarthrosis, unspecified whether generalized or localized, pelvic region and thigh 08/21/2013  . Personal history of colonic polyps 2007, 2011  +TA  . Pure hypercholesterolemia 08/21/2013  . Unspecified hypothyroidism 08/21/2013   Past Surgical History He has a past surgical history that includes Arthroscopy Knee (Left); Vasectomy; Colonoscopy (12/04/2005,03/10/2010); upper endoscopy (12/04/2005, 03/10/2010); Stent Placement Intracranial Percutaneous; flexible sigmoidoscopy (04/30/1996); Colonoscopy (06/25/2014); egd (06/25/2014); EGD with BARRX (10/28/2015); Knee arthroscopy; Vasectomy; and Left TKA using all cemented biomet Vanguard system with a 70 mm PCR femur, a 75 mm tibial tray with a 10 mm E-poly insert and a 34x 8.5 mm all-poly 3 pegged domed patella (Left, 12/19/2018).   Medications and Allergies  Current Medications  Current Outpatient Medications  Medication Sig Dispense Refill  . acetaminophen (TYLENOL) 500 mg capsule  . aspirin 81 MG EC tablet Take 81 mg by mouth once daily.  Marland Kitchen atorvastatin (LIPITOR) 80 MG tablet TAKE 1 TABLET (80 MG TOTAL) BY MOUTH ONCE DAILY. 90 tablet 3  . fluticasone propionate (FLONASE) 50 mcg/actuation nasal spray USE 2 SPRAYS INTO BOTH NOSTRILS ONE TIME DAILY 48 g 1  . levothyroxine (SYNTHROID) 150 MCG tablet TAKE 1 TABLET ONCE DAILY TAKE ON AN EMPTY STOMACH WITH A GLASS OF WATER AT LEAST 30-60 MINUTES BEFORE BREAKFAST. 90 tablet 0  . meloxicam (MOBIC) 15 MG tablet  . metoprolol tartrate (LOPRESSOR) 25 MG tablet Take 1 tablet (25 mg total) by mouth 2 (two) times daily 60 tablet 11  . omega-3 fatty acids/fish oil 340-1,000 mg capsule Take 2 capsules by mouth 2 (two)  times daily.  Marland Kitchen omeprazole (PRILOSEC) 20 MG DR capsule TAKE 1 CAPSULE EVERY DAY 90 capsule 3  . sildenafil (VIAGRA) 100 MG tablet Take 100  mg by mouth once daily as needed for Erectile Dysfunction.  . tamsulosin (FLOMAX) 0.4 mg capsule Take 1 capsule (0.4 mg total) by mouth once daily 90 capsule 3  . telmisartan (MICARDIS) 40 MG tablet Take 1 tablet (40 mg total) by mouth once daily 30 tablet 11   No current facility-administered medications for this visit.   Allergies: Patient has no known allergies.  Social and Family History  Social History reports that he has quit smoking. He has a 20.00 pack-year smoking history. He uses smokeless tobacco. He reports current alcohol use. He reports that he does not use drugs.  Family History Family History  Problem Relation Age of Onset  . Myocardial Infarction (Heart attack) Mother  . Lung disease Father   Review of Systems   Review of Systems: The patient denies chest pain, shortness of breath, orthopnea, paroxysmal nocturnal dyspnea, pedal edema, palpitations, heart racing, fatigue, dizziness, lightheadedness, presyncope, syncope, leg pain, leg cramping. Review of 12 Systems is negative except as described in HPI.   Physical Examination   Vitals:BP 142/72  Pulse 58  Ht 162.6 cm (5\' 4" )  Wt 84.8 kg (187 lb)  SpO2 98%  BMI 32.10 kg/m  Ht:162.6 cm (5\' 4" ) Wt:84.8 kg (187 lb) [id number] surface area is 1.96 meters squared. Body mass index is 32.1 kg/m.  General: Well developed, well nourished. In no acute distress HEENT: Pupils equally reactive to light and accomodation  Neck: Supple without thyromegaly, or goiter. Carotid pulses 2+. No carotid bruits present.  Pulmonary: Clear to auscultation bilaterally; no wheezes, rales, rhonchi Cardiovascular: Regular rate and rhythm. No gallops, murmurs or rubs Gastrointestinal: Soft nontender, nondistended, with normal bowel sounds Extremities: No cyanosis, clubbing, or edema Peripheral Pulses: 2+ in upper extremities, 2+ in lower extremities  Neurology: Alert and oriented X3 Pysch: Good affect. Responds appropriately  Assessment  and Plan   79 y.o. male with  1. Atherosclerosis of native coronary artery of native heart without angina pectoris  -Continue aspirin 81mg  daily, metoprolol 25mg  twice daily, and telmisartan 40mg  daily  -Will continue to monitor for symptoms  2. Essential hypertension, benign  -Continue metoprolol 25mg  twice daily and telmisartan 40mg  once daily  -Low sodium diet encouraged  3. Pure hypercholesterolemia  -Continue atorvastatin 80mg  daily with an LDL goal less than 70; most recent calculated at 125 -Discussed adding Zetia 10mg  to current regimen, but patient declines at this time  4. Obesity (BMI 30-39.9), unspecified  -Weight loss encouraged through dietary modifications and routine physical activity     No orders of the defined types were placed in this encounter.  Return in about 6 months (around 08/17/2019).  I personally performed the service, non-incident to. (WP)   [place], [place]

## 2019-06-27 ENCOUNTER — Encounter
Admission: RE | Disposition: A | Discharge status: Home / Self Care | Payer: Self-pay | Source: Home / Self Care | Attending: Surgery

## 2019-06-27 ENCOUNTER — Ambulatory Visit
Admission: RE | Admit: 2019-06-27 | Discharge: 2019-06-27 | Disposition: A | Discharge status: Home / Self Care | Payer: Medicare PPO | Attending: Surgery | Admitting: Surgery

## 2019-06-27 ENCOUNTER — Ambulatory Visit: Payer: Medicare PPO | Admitting: Anesthesiology

## 2019-06-27 ENCOUNTER — Ambulatory Visit: Payer: Medicare PPO

## 2019-06-27 ENCOUNTER — Encounter: Payer: Self-pay | Admitting: Surgery

## 2019-06-27 ENCOUNTER — Other Ambulatory Visit: Payer: Self-pay

## 2019-06-27 DIAGNOSIS — I10 Essential (primary) hypertension: Secondary | ICD-10-CM | POA: Diagnosis not present

## 2019-06-27 DIAGNOSIS — Z79899 Other long term (current) drug therapy: Secondary | ICD-10-CM | POA: Insufficient documentation

## 2019-06-27 DIAGNOSIS — Z79890 Hormone replacement therapy (postmenopausal): Secondary | ICD-10-CM | POA: Diagnosis not present

## 2019-06-27 DIAGNOSIS — Z96651 Presence of right artificial knee joint: Secondary | ICD-10-CM

## 2019-06-27 DIAGNOSIS — Z7982 Long term (current) use of aspirin: Secondary | ICD-10-CM | POA: Diagnosis not present

## 2019-06-27 DIAGNOSIS — I739 Peripheral vascular disease, unspecified: Secondary | ICD-10-CM | POA: Diagnosis not present

## 2019-06-27 DIAGNOSIS — M1990 Unspecified osteoarthritis, unspecified site: Secondary | ICD-10-CM | POA: Diagnosis not present

## 2019-06-27 DIAGNOSIS — K449 Diaphragmatic hernia without obstruction or gangrene: Secondary | ICD-10-CM | POA: Diagnosis not present

## 2019-06-27 DIAGNOSIS — I2510 Atherosclerotic heart disease of native coronary artery without angina pectoris: Secondary | ICD-10-CM | POA: Insufficient documentation

## 2019-06-27 DIAGNOSIS — E039 Hypothyroidism, unspecified: Secondary | ICD-10-CM | POA: Diagnosis not present

## 2019-06-27 DIAGNOSIS — Z87891 Personal history of nicotine dependence: Secondary | ICD-10-CM | POA: Insufficient documentation

## 2019-06-27 DIAGNOSIS — E7800 Pure hypercholesterolemia, unspecified: Secondary | ICD-10-CM | POA: Diagnosis not present

## 2019-06-27 DIAGNOSIS — M1711 Unilateral primary osteoarthritis, right knee: Secondary | ICD-10-CM | POA: Insufficient documentation

## 2019-06-27 DIAGNOSIS — Z951 Presence of aortocoronary bypass graft: Secondary | ICD-10-CM | POA: Insufficient documentation

## 2019-06-27 DIAGNOSIS — K219 Gastro-esophageal reflux disease without esophagitis: Secondary | ICD-10-CM | POA: Insufficient documentation

## 2019-06-27 HISTORY — PX: PARTIAL KNEE ARTHROPLASTY: SHX2174

## 2019-06-27 SURGERY — ARTHROPLASTY, KNEE, UNICOMPARTMENTAL
Anesthesia: Spinal | Site: Knee | Laterality: Right

## 2019-06-27 MED ORDER — PHENYLEPHRINE HCL (PRESSORS) 10 MG/ML IV SOLN
INTRAVENOUS | Status: DC | PRN
  Administered 2019-06-27 (×2): 100 ug via INTRAVENOUS
  Administered 2019-06-27: 50 ug via INTRAVENOUS

## 2019-06-27 MED ORDER — BUPIVACAINE LIPOSOME 1.3 % IJ SUSP
INTRAMUSCULAR | Status: DC | PRN
  Administered 2019-06-27: 20 mL

## 2019-06-27 MED ORDER — ONDANSETRON HCL 4 MG PO TABS: 4.0000 mg | ORAL_TABLET | Freq: Four times a day (QID) | ORAL | Status: DC | PRN

## 2019-06-27 MED ORDER — OXYCODONE HCL 5 MG/5ML PO SOLN: 5.0000 mg | Freq: Once | ORAL | Status: DC | PRN

## 2019-06-27 MED ORDER — KETOROLAC TROMETHAMINE 15 MG/ML IJ SOLN
INTRAMUSCULAR | Status: DC | PRN
  Administered 2019-06-27: 15 mg via INTRAVENOUS

## 2019-06-27 MED ORDER — BUPIVACAINE HCL (PF) 0.25 % IJ SOLN
INTRAMUSCULAR | Status: AC
  Filled 2019-06-27: qty 30

## 2019-06-27 MED ORDER — TRAMADOL HCL 50 MG PO TABS
ORAL_TABLET | ORAL | Status: AC
  Filled 2019-06-27: qty 1

## 2019-06-27 MED ORDER — BUPIVACAINE HCL (PF) 0.5 % IJ SOLN
INTRAMUSCULAR | Status: DC | PRN
  Administered 2019-06-27: 2 mL

## 2019-06-27 MED ORDER — ONDANSETRON HCL 4 MG/2ML IJ SOLN: 4.0000 mg | Freq: Four times a day (QID) | INTRAMUSCULAR | Status: DC | PRN

## 2019-06-27 MED ORDER — BUPIVACAINE LIPOSOME 1.3 % IJ SUSP
INTRAMUSCULAR | Status: AC
  Filled 2019-06-27: qty 20

## 2019-06-27 MED ORDER — EPHEDRINE SULFATE 50 MG/ML IJ SOLN
INTRAMUSCULAR | Status: AC
  Filled 2019-06-27: qty 1

## 2019-06-27 MED ORDER — EPINEPHRINE PF 1 MG/ML IJ SOLN
INTRAMUSCULAR | Status: AC
  Filled 2019-06-27: qty 1

## 2019-06-27 MED ORDER — LACTATED RINGERS IV SOLN: INTRAVENOUS | Status: DC

## 2019-06-27 MED ORDER — TRANEXAMIC ACID 1000 MG/10ML IV SOLN
INTRAVENOUS | Status: AC
  Filled 2019-06-27: qty 10

## 2019-06-27 MED ORDER — METOCLOPRAMIDE HCL 5 MG/ML IJ SOLN: 5.0000 mg | Freq: Three times a day (TID) | INTRAMUSCULAR | Status: DC | PRN

## 2019-06-27 MED ORDER — PROPOFOL 500 MG/50ML IV EMUL
INTRAVENOUS | Status: AC
  Filled 2019-06-27: qty 50

## 2019-06-27 MED ORDER — TRAMADOL HCL 50 MG PO TABS: 50.0000 mg | ORAL_TABLET | Freq: Four times a day (QID) | ORAL | 0 refills | Status: DC | PRN

## 2019-06-27 MED ORDER — CEFAZOLIN SODIUM-DEXTROSE 2-4 GM/100ML-% IV SOLN
INTRAVENOUS | Status: AC
  Filled 2019-06-27: qty 100

## 2019-06-27 MED ORDER — BUPIVACAINE HCL (PF) 0.75 % IJ SOLN
INTRAMUSCULAR | Status: DC | PRN
  Administered 2019-06-27: 10 mL

## 2019-06-27 MED ORDER — CEFAZOLIN SODIUM-DEXTROSE 2-4 GM/100ML-% IV SOLN
2.0000 g | INTRAVENOUS | Status: AC
  Administered 2019-06-27: 2 g via INTRAVENOUS

## 2019-06-27 MED ORDER — PROPOFOL 10 MG/ML IV BOLUS
INTRAVENOUS | Status: AC
  Filled 2019-06-27: qty 20

## 2019-06-27 MED ORDER — MIDAZOLAM HCL 5 MG/5ML IJ SOLN
INTRAMUSCULAR | Status: DC | PRN
  Administered 2019-06-27: 2 mg via INTRAVENOUS

## 2019-06-27 MED ORDER — MIDAZOLAM HCL 2 MG/2ML IJ SOLN
INTRAMUSCULAR | Status: AC
  Filled 2019-06-27: qty 2

## 2019-06-27 MED ORDER — TRANEXAMIC ACID 1000 MG/10ML IV SOLN
INTRAVENOUS | Status: DC | PRN
  Administered 2019-06-27: 1000 mg via TOPICAL

## 2019-06-27 MED ORDER — KETAMINE HCL 50 MG/ML IJ SOLN
INTRAMUSCULAR | Status: AC
  Filled 2019-06-27: qty 10

## 2019-06-27 MED ORDER — KETAMINE HCL 50 MG/ML IJ SOLN
INTRAMUSCULAR | Status: DC | PRN
  Administered 2019-06-27: 30 mg via INTRAMUSCULAR

## 2019-06-27 MED ORDER — DEXAMETHASONE SODIUM PHOSPHATE 10 MG/ML IJ SOLN
INTRAMUSCULAR | Status: DC | PRN
  Administered 2019-06-27: 10 mg via INTRAVENOUS

## 2019-06-27 MED ORDER — GLYCOPYRROLATE 0.2 MG/ML IJ SOLN
INTRAMUSCULAR | Status: DC | PRN
  Administered 2019-06-27: .2 mg via INTRAVENOUS

## 2019-06-27 MED ORDER — TAMSULOSIN HCL 0.4 MG PO CAPS
0.4000 mg | ORAL_CAPSULE | Freq: Once | ORAL | Status: AC
  Administered 2019-06-27: 0.4 mg via ORAL
  Filled 2019-06-27: qty 1

## 2019-06-27 MED ORDER — BUPIVACAINE HCL (PF) 0.75 % IJ SOLN
INTRAMUSCULAR | Status: AC
  Filled 2019-06-27: qty 10

## 2019-06-27 MED ORDER — FENTANYL CITRATE (PF) 100 MCG/2ML IJ SOLN: 25.0000 ug | INTRAMUSCULAR | Status: DC | PRN

## 2019-06-27 MED ORDER — PROPOFOL 500 MG/50ML IV EMUL
INTRAVENOUS | Status: DC | PRN
  Administered 2019-06-27: 100 ug/kg/min via INTRAVENOUS

## 2019-06-27 MED ORDER — ACETAMINOPHEN 10 MG/ML IV SOLN
INTRAVENOUS | Status: DC | PRN
  Administered 2019-06-27: 1000 mg via INTRAVENOUS

## 2019-06-27 MED ORDER — OXYCODONE HCL 5 MG PO TABS: 5.0000 mg | ORAL_TABLET | Freq: Once | ORAL | Status: DC | PRN

## 2019-06-27 MED ORDER — CEFAZOLIN SODIUM-DEXTROSE 2-4 GM/100ML-% IV SOLN
2.0000 g | Freq: Four times a day (QID) | INTRAVENOUS | Status: DC
  Administered 2019-06-27: 2 g via INTRAVENOUS

## 2019-06-27 MED ORDER — APIXABAN 2.5 MG PO TABS: 2.5000 mg | ORAL_TABLET | Freq: Two times a day (BID) | ORAL | 0 refills | Status: DC

## 2019-06-27 MED ORDER — METOCLOPRAMIDE HCL 10 MG PO TABS: 5.0000 mg | ORAL_TABLET | Freq: Three times a day (TID) | ORAL | Status: DC | PRN

## 2019-06-27 MED ORDER — CHLORHEXIDINE GLUCONATE 4 % EX LIQD: 60.0000 mL | Freq: Once | CUTANEOUS | Status: DC

## 2019-06-27 MED ORDER — SODIUM CHLORIDE 0.9 % BOLUS PEDS: 250.0000 mL | Freq: Once | INTRAVENOUS | Status: DC

## 2019-06-27 MED ORDER — EPHEDRINE SULFATE 50 MG/ML IJ SOLN
INTRAMUSCULAR | Status: DC | PRN
  Administered 2019-06-27 (×3): 5 mg via INTRAVENOUS

## 2019-06-27 MED ORDER — LIDOCAINE HCL (CARDIAC) PF 100 MG/5ML IV SOSY
PREFILLED_SYRINGE | INTRAVENOUS | Status: DC | PRN
  Administered 2019-06-27: 100 mg via INTRAVENOUS

## 2019-06-27 MED ORDER — TRAMADOL HCL 50 MG PO TABS
50.0000 mg | ORAL_TABLET | Freq: Four times a day (QID) | ORAL | Status: DC | PRN
  Administered 2019-06-27: 50 mg via ORAL

## 2019-06-27 MED ORDER — SODIUM CHLORIDE 0.9 % BOLUS PEDS
250.0000 mL | Freq: Once | INTRAVENOUS | Status: AC
  Administered 2019-06-27: 11:00:00 250 mL via INTRAVENOUS

## 2019-06-27 MED ORDER — SODIUM CHLORIDE 0.9 % IV SOLN
INTRAVENOUS | Status: DC | PRN
  Administered 2019-06-27: 08:00:00 50 ug/min via INTRAVENOUS

## 2019-06-27 MED ORDER — POTASSIUM CHLORIDE IN NACL 20-0.9 MEQ/L-% IV SOLN
INTRAVENOUS | Status: DC
  Filled 2019-06-27: qty 1000

## 2019-06-27 MED ORDER — BUPIVACAINE-EPINEPHRINE (PF) 0.25% -1:200000 IJ SOLN
INTRAMUSCULAR | Status: DC | PRN
  Administered 2019-06-27: 20 mL via PERINEURAL

## 2019-06-27 MED ORDER — ACETAMINOPHEN 500 MG PO TABS
1000.0000 mg | ORAL_TABLET | Freq: Four times a day (QID) | ORAL | Status: DC
  Administered 2019-06-27: 1000 mg via ORAL

## 2019-06-27 MED ORDER — ACETAMINOPHEN 10 MG/ML IV SOLN
INTRAVENOUS | Status: AC
  Filled 2019-06-27: qty 100

## 2019-06-27 MED ORDER — ACETAMINOPHEN 500 MG PO TABS
ORAL_TABLET | ORAL | Status: AC
  Filled 2019-06-27: qty 2

## 2019-06-27 MED ORDER — SODIUM CHLORIDE (PF) 0.9 % IJ SOLN
INTRAMUSCULAR | Status: AC
  Filled 2019-06-27: qty 10

## 2019-06-27 SURGICAL SUPPLY — 65 items
BEARING MENISCAL TIBIAL 6 SM R (Orthopedic Implant) ×3 IMPLANT
BNDG ELASTIC 4X5.8 VLCR STR LF (GAUZE/BANDAGES/DRESSINGS) ×3 IMPLANT
BNDG ELASTIC 6X5.8 VLCR STR LF (GAUZE/BANDAGES/DRESSINGS) ×3 IMPLANT
CANISTER SUCT 1200ML W/VALVE (MISCELLANEOUS) ×3 IMPLANT
CANISTER SUCT 3000ML PPV (MISCELLANEOUS) ×3 IMPLANT
CEMENT BONE R 1X40 (Cement) ×3 IMPLANT
CEMENT VACUUM MIXING SYSTEM (MISCELLANEOUS) ×3 IMPLANT
CHLORAPREP W/TINT 26 (MISCELLANEOUS) ×6 IMPLANT
COOLER POLAR GLACIER W/PUMP (MISCELLANEOUS) ×3 IMPLANT
COVER MAYO STAND REUSABLE (DRAPES) ×3 IMPLANT
COVER WAND RF STERILE (DRAPES) ×3 IMPLANT
CUFF TOURN SGL QUICK 24 (TOURNIQUET CUFF)
CUFF TOURN SGL QUICK 30 (TOURNIQUET CUFF) ×2
CUFF TRNQT CYL 24X4X16.5-23 (TOURNIQUET CUFF) IMPLANT
CUFF TRNQT CYL 30X4X21-28X (TOURNIQUET CUFF) ×1 IMPLANT
DRAPE C-ARM XRAY 36X54 (DRAPES) ×3 IMPLANT
DRSG OPSITE POSTOP 4X12 (GAUZE/BANDAGES/DRESSINGS) ×3 IMPLANT
DRSG OPSITE POSTOP 4X14 (GAUZE/BANDAGES/DRESSINGS) ×3 IMPLANT
DRSG OPSITE POSTOP 4X6 (GAUZE/BANDAGES/DRESSINGS) ×3 IMPLANT
ELECT CAUTERY BLADE 6.4 (BLADE) ×3 IMPLANT
ELECT REM PT RETURN 9FT ADLT (ELECTROSURGICAL) ×3
ELECTRODE REM PT RTRN 9FT ADLT (ELECTROSURGICAL) ×1 IMPLANT
GAUZE 4X4 16PLY RFD (DISPOSABLE) ×3 IMPLANT
GAUZE SPONGE 4X4 12PLY STRL (GAUZE/BANDAGES/DRESSINGS) ×3 IMPLANT
GAUZE XEROFORM 1X8 LF (GAUZE/BANDAGES/DRESSINGS) ×3 IMPLANT
GLOVE BIO SURGEON STRL SZ7.5 (GLOVE) ×12 IMPLANT
GLOVE BIO SURGEON STRL SZ8 (GLOVE) ×12 IMPLANT
GLOVE BIOGEL PI IND STRL 8 (GLOVE) ×1 IMPLANT
GLOVE BIOGEL PI INDICATOR 8 (GLOVE) ×2
GLOVE INDICATOR 8.0 STRL GRN (GLOVE) ×3 IMPLANT
GOWN STRL REUS W/ TWL LRG LVL3 (GOWN DISPOSABLE) ×1 IMPLANT
GOWN STRL REUS W/ TWL XL LVL3 (GOWN DISPOSABLE) ×1 IMPLANT
GOWN STRL REUS W/TWL LRG LVL3 (GOWN DISPOSABLE) ×2
GOWN STRL REUS W/TWL XL LVL3 (GOWN DISPOSABLE) ×2
HOLDER FOLEY CATH W/STRAP (MISCELLANEOUS) IMPLANT
HOOD PEEL AWAY FLYTE STAYCOOL (MISCELLANEOUS) ×12 IMPLANT
KIT TURNOVER KIT A (KITS) ×3 IMPLANT
MAT ABSORB  FLUID 56X50 GRAY (MISCELLANEOUS) ×2
MAT ABSORB FLUID 56X50 GRAY (MISCELLANEOUS) ×1 IMPLANT
NDL SAFETY ECLIPSE 18X1.5 (NEEDLE) ×1 IMPLANT
NEEDLE HYPO 18GX1.5 SHARP (NEEDLE) ×2
NEEDLE SPNL 20GX3.5 QUINCKE YW (NEEDLE) ×3 IMPLANT
NS IRRIG 1000ML POUR BTL (IV SOLUTION) ×3 IMPLANT
PACK BLADE SAW RECIP 70 3 PT (BLADE) ×3 IMPLANT
PACK TOTAL KNEE (MISCELLANEOUS) ×3 IMPLANT
PAD WRAPON POLAR KNEE (MISCELLANEOUS) ×1 IMPLANT
PEG FEMORAL PEGGED STRL SM (Knees) ×3 IMPLANT
PULSAVAC PLUS IRRIG FAN TIP (DISPOSABLE) ×3
SOL .9 NS 3000ML IRR  AL (IV SOLUTION) ×2
SOL .9 NS 3000ML IRR UROMATIC (IV SOLUTION) ×1 IMPLANT
STAPLER SKIN PROX 35W (STAPLE) ×3 IMPLANT
STRAP SAFETY 5IN WIDE (MISCELLANEOUS) ×3 IMPLANT
SUCTION FRAZIER HANDLE 10FR (MISCELLANEOUS) ×2
SUCTION TUBE FRAZIER 10FR DISP (MISCELLANEOUS) ×1 IMPLANT
SUT VIC AB 0 CT1 36 (SUTURE) ×3 IMPLANT
SUT VIC AB 2-0 CT1 27 (SUTURE) ×8
SUT VIC AB 2-0 CT1 TAPERPNT 27 (SUTURE) ×4 IMPLANT
SYR 10ML LL (SYRINGE) ×3 IMPLANT
SYR 20ML LL LF (SYRINGE) ×3 IMPLANT
SYR 30ML LL (SYRINGE) ×9 IMPLANT
TAPE TRANSPORE STRL 2 31045 (GAUZE/BANDAGES/DRESSINGS) ×3 IMPLANT
TIP FAN IRRIG PULSAVAC PLUS (DISPOSABLE) ×1 IMPLANT
TRAY FOLEY MTR SLVR 16FR STAT (SET/KITS/TRAYS/PACK) IMPLANT
TRAY TIBIAL OXFORD SZ C RT (Joint) ×3 IMPLANT
WRAPON POLAR PAD KNEE (MISCELLANEOUS) ×3

## 2019-06-27 NOTE — Anesthesia Procedure Notes (Signed)
Spinal  Patient location during procedure: OR Staffing Performed: resident/CRNA  Preanesthetic Checklist Completed: patient identified, IV checked, site marked, risks and benefits discussed, surgical consent, monitors and equipment checked, pre-op evaluation and timeout performed Spinal Block Patient position: sitting Prep: ChloraPrep Patient monitoring: heart rate, continuous pulse ox, blood pressure and cardiac monitor Approach: midline Location: L4-5 Injection technique: single-shot Needle Needle type: Whitacre and Introducer  Needle gauge: 24 G Needle length: 9 cm Assessment Sensory level: T4 Additional Notes Negative paresthesia. Negative blood return. Positive free-flowing CSF. Expiration date of kit checked and confirmed. Patient tolerated procedure well, without complications.

## 2019-06-27 NOTE — Anesthesia Postprocedure Evaluation (Signed)
Anesthesia Post Note  Patient: Warren Ingram  Procedure(s) Performed: UNICOMPARTMENTAL KNEE (Right Knee)  Patient location during evaluation: PACU Anesthesia Type: Spinal Level of consciousness: oriented and awake and alert Pain management: pain level controlled Vital Signs Assessment: post-procedure vital signs reviewed and stable Respiratory status: spontaneous breathing and respiratory function stable Cardiovascular status: blood pressure returned to baseline and stable Postop Assessment: no headache, no backache, no apparent nausea or vomiting and patient able to bend at knees Anesthetic complications: no     Last Vitals:  Vitals:   06/27/19 1457 06/27/19 1459  BP: (!) 145/118 (!) 145/89  Pulse: 81 79  Resp: 18   Temp: 37.2 C   SpO2: 99%     Last Pain:  Vitals:   06/27/19 1457  TempSrc: Oral  PainSc:                  Precious Haws Malky Rudzinski

## 2019-06-27 NOTE — Transfer of Care (Signed)
Immediate Anesthesia Transfer of Care Note  Patient: Warren Ingram  Procedure(s) Performed: UNICOMPARTMENTAL KNEE (Right Knee)  Patient Location: PACU  Anesthesia Type:Spinal  Level of Consciousness: awake, alert  and oriented  Airway & Oxygen Therapy: Patient Spontanous Breathing  Post-op Assessment: Report given to RN and Post -op Vital signs reviewed and stable  Post vital signs: Reviewed and stable  Last Vitals:  Vitals Value Taken Time  BP 136/86 06/27/19 0951  Temp    Pulse 71 06/27/19 0951  Resp 20 06/27/19 0951  SpO2 94 % 06/27/19 0951  Vitals shown include unvalidated device data.  Last Pain:  Vitals:   06/27/19 0607  TempSrc: Temporal  PainSc: 1          Complications: No apparent anesthesia complications

## 2019-06-27 NOTE — Op Note (Signed)
06/27/2019  9:40 AM  Patient:   Warren Ingram  Pre-Op Diagnosis:   Osteoarthritis of medial compartment, right knee.  Post-Op Diagnosis:   Same  Procedure:   Right unicondylar knee arthroplasty.  Surgeon:   Pascal Lux, MD  Assistant:   Cameron Proud, PA-C; Cherlynn June, PA-S  Anesthesia:   Spinal  Findings:   As above.  Complications:   None  EBL:   5 cc  Fluids:   800 cc crystalloid  UOP:   None  TT:   85 minutes at 300 mmHg  Drains:   None  Closure:   Staples  Implants:   All-cemented Biomet Oxford system with a Small femoral component, a "C" sized tibial tray, and a 6 mm meniscal bearing insert.  Brief Clinical Note:   The patient is a 79 year old male with a 6+ month history of progressively worsening medial sided right knee pain. His symptoms have progressed despite medications, activity modification, etc. His history and examination consistent with degenerative joint disease, confirmed by both plain radiographs and MRI scan. The patient presents at this time for a right partial knee replacement.  Procedure:   The patient was brought into the operating room and a spinal placed by the anesthesiologist. The patient was lain in the supine position and a Foley catheter inserted. The patient was repositioned so that the non-surgical leg was placed in a flexed and abducted position in the yellow fin leg holder while the surgical extremity was placed over the Biomet leg holder. The right lower extremity was prepped with ChloraPrep solution before being draped sterilely. Preoperative antibiotics were administered. After performing a timeout to verify the appropriate surgical site, the limb was exsanguinated with an Esmarch and the tourniquet inflated to 300 mmHg.   A standard anterior approach to the knee was made through an approximately 3.5-4 inch incision. The incision was carried down through the subcutaneous tissues to expose the superficial retinaculum. This was  split the length the incision and the medial flap elevated sufficiently to expose the medial retinaculum. The medial retinaculum was incised along the medial border of the patella tendon and extended proximally along the medial border of the patella, leaving a 3-4 mm cuff of tissue. The soft tissues were elevated off the anteromedial aspect of the proximal tibia. The anterior portion of the meniscus was removed after performing a subtotal excision of the infrapatellar fat pad. The anterior cruciate ligament was inspected and found to be in excellent condition. Osteophytes were removed from the inferior pole of the patella as well as from the notch using a quarter-inch osteotome. There were significant degenerative changes of both the femur and tibia on the medial side. The medial femoral condyle was sized using the small and medium sizers. It was felt that the Small guide best optimized the contour of the femur. This was left in place and the external tibial guide positioned. The coupling device was used to connect the guide to the medial femoral condylar sizer to optimize appropriate orientation. Two guide pins were inserted into the cutting block before the coupling device and sizer were removed. The appropriate tibial cut was made using the oscillating and reciprocating saws. The piece was removed in its entirety and taken to the back table where it was sized and found to be optimally replicated by a "C" sized component. The 9 mm spacer was inserted to verify that sufficient bone had been removed.  Attention was directed to femoral side. The intramedullary canal was accessed  through a 4 mm drill hole. The intramedullary guide was positioned before the guide for the femoral condylar holes was positioned. The appropriate coupling device connected this guide to the intramedullary guide before both drill holes were created in the distal aspect of the medial femoral condyle. The devices were removed and the  posterior condylar cutting block inserted. The appropriate cut was made using the reciprocating saw and this piece removed. The #0 spigot was inserted and the initial bone milling performed. A trial femoral component was inserted and both the flexion and extension gaps measured. In flexion, the gap measured 8 mm whereas in extension, it measured 4 mm. Therefore, the #4 spigot was selected and the secondary bone milling performed. Repeat sizing demonstrated symmetric flexion and extension gaps. The bone was removed from the postero-medial and postero-lateral aspects of the femoral condyle, as well as from the beneath the collar of the spigot. Bone also was removed from the anterior portion of the femur so as to minimize any potential impingement with the meniscal bearing insert. The trial components removed and several drill holes placed into the distal femoral condyle to further augment cement fixation.  Attention was redirected to the tibial side. The "C" sized tibial tray was positioned and temporarily secured using the appropriate spiked nail. The keel was created using the bi-bladed reciprocating saw and hoe. The keeled "C" sized trial tibial tray was inserted to be sure that it seated properly. At this point, a total of 20 cc of Exparel diluted out to 60 cc with normal saline and 30 cc of 0.5% Sensorcaine was injected in and around the posterior and medial capsular tissues, as well as the peri-incisional tissues to help with postoperative pain control.  The bony surfaces were prepared for cementing by irrigating them thoroughly with bacitracin saline solution using the jet lavage system before packing them with a dry Ray-Tec sponge. Meanwhile, cement was being mixed on the back table. When the cement was ready, the tibial tray was cemented in first. The excess cement was removed using a Surveyor, quantity after impacting it into place. Next, the femoral component was impacted into place. Again the excess  cement was removed using a Surveyor, quantity. The 6 mm spacer was inserted and the knee brought into near full extension while the cement hardened. Once the cement hardened, the spacer was removed and the 5 mm and 6 mm meniscal bearing inserts were trialed. This demonstrated excellent tracking while the knee was placed through a range of motion, and showed no evidence towards subluxation or dislocation. In addition, it did not fit too tightly. Therefore, the permanent 6 mm meniscal bearing insert was snapped into position after verifying that no cement had been retained posteriorly. Again the knee was placed through a range of motion with the findings as described above.  The wound was copiously irrigated with sterile saline solution via the jet lavage system before the retinacular layer was reapproximated using #0 Vicryl interrupted sutures. At this point, 1 g of transexemic acid in 10 cc of normal saline was injected intra-articularly. The subcutaneous tissues were closed in two layers using 2-0 Vicryl interrupted sutures before the skin was closed using staples. A sterile occlusive dressing was applied to the knee before the patient was awakened. The patient was transferred back to his/her hospital bed and returned to the recovery room in satisfactory condition after tolerating the procedure well. A Polar Care device was applied to the knee as well.

## 2019-06-27 NOTE — H&P (Signed)
Paper H&P to be scanned into permanent record. H&P reviewed and patient re-examined. No changes. 

## 2019-06-27 NOTE — Evaluation (Signed)
Physical Therapy Evaluation Patient Details Name: Warren Ingram MRN: [id number] DOB: 19-Jan-1941 Today's Date: 06/27/2019   History of Present Illness  seen in peri-op area after R TKR (06/27/19), WBAT.  Clinical Impression  Upon evaluation, patient alert and oriented; follows commands and demonstrates good effort with all mobility tasks.  Pain well-controlled, rated 0-1/10; unchanged with mobility and WBing activities.  Good post-op strength (3-/5) and ROM (3-103 degrees) to R LE.  Good quad activation noted; able to complete SLR without difficulty, good control.  Able to complete bed mobility with sup; sit/stand, basic transfers and gait (100' x1, 20' x1) with RW, cga/min assist; stairs x4 with single handrail and HHA, cga/min assist.  Demonstrates reciprocal stepping pattern with fair step height/length, forward flexed posture (min cuing for correction), mild sway but able to self-correct; steady cadence with good R knee control.   Educated patient/wife in mobility status/level of assist required, use of polar care, HEP (handout with written/pictorial descriptions), activity progression, assist device use, car transfer technique; patient/wife voiced understanding of all information.  Patient/wife comfortable with all information provided; comfortable with upcoming discharge home. No additional questions/concerns at this time. Would benefit from skilled PT to address above deficits and promote optimal return to PLOF.; Recommend transition to HHPT upon discharge from acute hospitalization.    Follow Up Recommendations Home health PT    Equipment Recommendations  3in1 (PT)    Recommendations for Other Services       Precautions / Restrictions Precautions Precautions: Fall Restrictions Weight Bearing Restrictions: Yes RLE Weight Bearing: Weight bearing as tolerated      Mobility  Bed Mobility Overal bed mobility: Needs Assistance Bed Mobility: Sit to Supine       Sit to  supine: Supervision      Transfers Overall transfer level: Needs assistance Equipment used: Rolling walker (2 wheeled) Transfers: Sit to/from Stand Sit to Stand: Min guard;Min assist         General transfer comment: cuing for hand placement to prevent pulling on RW  Ambulation/Gait Ambulation/Gait assistance: Min guard;Min assist Gait Distance (Feet): (20' x1, 100' x1) Assistive device: Rolling walker (2 wheeled)       General Gait Details: reciprocal stepping pattern with fair step height/length, forward flexed posture (min cuing for correction); steady cadence with good R knee control  Stairs Stairs: Yes Stairs assistance: Min assist   Number of Stairs: (4x2) General stair comments: up/down 4 with bilat rails, cga/min assist; up/down 4 with R rail, L HHA, cga/min assist.  Good R knee stability in modified SLS.  Wheelchair Mobility    Modified Rankin (Stroke Patients Only)       Balance Overall balance assessment: Needs assistance Sitting-balance support: No upper extremity supported;Feet supported Sitting balance-Leahy Scale: Good     Standing balance support: Bilateral upper extremity supported Standing balance-Leahy Scale: Fair                               Pertinent Vitals/Pain Pain Assessment: No/denies pain    Home Living Family/patient expects to be discharged to:: Private residence Living Arrangements: Spouse/significant other Available Help at Discharge: Family;Available 24 hours/day   Home Access: Stairs to enter Entrance Stairs-Rails: Chemical engineer of Steps: 2 Home Layout: Able to live on main level with bedroom/bathroom Home Equipment: Walker - 2 wheels;Cane - single point      Prior Function Level of Independence: Independent  Comments: Indep with ADLs, household and community mobilization without assist device; Pt with increasing knee pain recently, but able to drive and be out of the home;      Hand Dominance        Extremity/Trunk Assessment   Upper Extremity Assessment Upper Extremity Assessment: Overall WFL for tasks assessed    Lower Extremity Assessment Lower Extremity Assessment: (R knee grossly 3-/5, limited by post-op dressing; otherwise, grossly WFL)       Communication   Communication: No difficulties  Cognition Arousal/Alertness: Awake/alert Behavior During Therapy: WFL for tasks assessed/performed Overall Cognitive Status: Within Functional Limits for tasks assessed                                        General Comments      Exercises Total Joint Exercises Goniometric ROM: R knee: 3-103 degrees Other Exercises Other Exercises: Educated in use of polar care, HEP (handout with written/pictorial descriptions), activity progression, car transfers; patient/wife voiced understanding of all information.   Assessment/Plan    PT Assessment Patient needs continued PT services  PT Problem List Decreased strength;Decreased range of motion;Decreased activity tolerance;Decreased balance;Decreased mobility;Decreased knowledge of use of DME;Decreased safety awareness;Decreased knowledge of precautions;Cardiopulmonary status limiting activity;Pain;Decreased skin integrity       PT Treatment Interventions DME instruction;Gait training;Stair training;Functional mobility training;Therapeutic activities;Therapeutic exercise;Balance training;Patient/family education    PT Goals (Current goals can be found in the Care Plan section)  Acute Rehab PT Goals Patient Stated Goal: to return home as soon as I can! PT Goal Formulation: With patient/family Time For Goal Achievement: 07/11/19 Potential to Achieve Goals: Good    Frequency BID   Barriers to discharge        Co-evaluation               AM-PAC PT "6 Clicks" Mobility  Outcome Measure Help needed turning from your back to your side while in a flat bed without using bedrails?:  None Help needed moving from lying on your back to sitting on the side of a flat bed without using bedrails?: None Help needed moving to and from a bed to a chair (including a wheelchair)?: A Little Help needed standing up from a chair using your arms (e.g., wheelchair or bedside chair)?: A Little Help needed to walk in hospital room?: A Little Help needed climbing 3-5 steps with a railing? : A Little 6 Click Score: 20    End of Session Equipment Utilized During Treatment: Gait belt Activity Tolerance: Patient tolerated treatment well Patient left: in chair;with call bell/phone within reach;with family/visitor present Nurse Communication: Mobility status PT Visit Diagnosis: Difficulty in walking, not elsewhere classified (R26.2);Other symptoms and signs involving the nervous system (R29.898);Pain Pain - Right/Left: Right Pain - part of body: Knee    Time: 1323-1410 PT Time Calculation (min) (ACUTE ONLY): 47 min   Charges:   PT Evaluation $PT Eval Moderate Complexity: 1 Mod PT Treatments $Gait Training: 8-22 mins $Therapeutic Activity: 23-37 mins        Lessie Funderburke H. Owens Shark, PT, DPT, NCS 06/27/19, 2:33 PM [phone]

## 2019-06-27 NOTE — Discharge Instructions (Addendum)
Orthopedic discharge instructions: May shower with an intact OpSite dressing. Apply ice frequently to knee or use Polar Care. Start Eliquis 2.5 mg twice daily on Saturday, 06/28/2019, for 2 weeks, then take aspirin 325 mg daily for 4 weeks. Take extra strength Tylenol when needed.  May supplement with tramadol if necessary. May weight-bear as tolerated on right lower extremity - use walker for balance and support. Follow-up in 10-14 days or as scheduled.    AMBULATORY SURGERY  DISCHARGE INSTRUCTIONS   1) The drugs that you were given will stay in your system until tomorrow so for the next 24 hours you should not:  A) Drive an automobile B) Make any legal decisions C) Drink any alcoholic beverage   2) You may resume regular meals tomorrow.  Today it is better to start with liquids and gradually work up to solid foods.  You may eat anything you prefer, but it is better to start with liquids, then soup and crackers, and gradually work up to solid foods.   3) Please notify your doctor immediately if you have any unusual bleeding, trouble breathing, redness and pain at the surgery site, drainage, fever, or pain not relieved by medication.    4) Additional Instructions:        Please contact your physician with any problems or Same Day Surgery at [phone], Monday through Friday 6 am to 4 pm, or [place] at [facility] number at [phone].

## 2019-06-27 NOTE — Anesthesia Preprocedure Evaluation (Addendum)
Anesthesia Evaluation  Patient identified by MRN, date of birth, ID band Patient awake    Reviewed: Allergy & Precautions, H&P , NPO status , Patient's Chart, lab work & pertinent test results  History of Anesthesia Complications (+) PROLONGED EMERGENCE and history of anesthetic complications  Airway Mallampati: III  TM Distance: <3 FB Neck ROM: limited    Dental  (+) Chipped, Poor Dentition, Missing   Pulmonary neg shortness of breath, former smoker,           Cardiovascular Exercise Tolerance: Good hypertension, (-) angina+ CAD and + CABG  (-) DOE + Valvular Problems/Murmurs      Neuro/Psych  Headaches, CVA, Residual Symptoms negative psych ROS   GI/Hepatic Neg liver ROS, hiatal hernia, GERD  Medicated and Controlled,  Endo/Other  Hypothyroidism   Renal/GU      Musculoskeletal  (+) Arthritis ,   Abdominal   Peds  Hematology negative hematology ROS (+)   Anesthesia Other Findings Past Medical History: No date: Arthritis 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 No date: Benign neoplasm of abdomen No date: Benign neoplasm of colon No date: Cancer (HCC)     Comment:  skin cancer No date: Coronary artery disease No date: GERD (gastroesophageal reflux disease) No date: Heart murmur     Comment:  ASYMPTOMATIC No date: History of hiatal hernia No date: Hypercholesterolemia No date: Hypothyroidism 2015: Stroke [facility])     Comment:  mini stroke  Past Surgical History: No date: CARDIAC CATHETERIZATION 01/01/2018: COLONOSCOPY WITH PROPOFOL; N/A     Comment:  Procedure: COLONOSCOPY WITH PROPOFOL;  Surgeon: Jonathon Bellows, MD;  Location: [facility] ENDOSCOPY;  Service:               Gastroenterology;  Laterality: N/A; 08/2017: CORONARY ARTERY BYPASS GRAFT     Comment:  4 vessels 10/28/2015: ESOPHAGOGASTRODUODENOSCOPY (EGD) WITH PROPOFOL; N/A     Comment:  Procedure: ESOPHAGOGASTRODUODENOSCOPY (EGD) WITH                PROPOFOL;  Surgeon: Hulen Luster, MD;  Location: [facility]               ENDOSCOPY;  Service: Gastroenterology;  Laterality: N/A; No date: KNEE ARTHROSCOPY 07/25/2017: LEFT HEART CATH AND CORONARY ANGIOGRAPHY; N/A     Comment:  Procedure: LEFT HEART CATH AND CORONARY ANGIOGRAPHY;                Surgeon: Teodoro Spray, MD;  Location: [facility]              LAB;  Service: Cardiovascular;  Laterality: N/A; No date: NASAL SINUS SURGERY 12/19/2018: TOTAL KNEE ARTHROPLASTY; Left     Comment:  Procedure: TOTAL KNEE ARTHROPLASTY;  Surgeon: Corky Mull, MD;  Location: [facility] ORS;  Service: Orthopedics;                Laterality: Left; No date: VASECTOMY  BMI    Body Mass Index: 32.39 kg/m      Reproductive/Obstetrics negative OB ROS                            Anesthesia Physical Anesthesia Plan  ASA: III  Anesthesia Plan: Spinal   Post-op Pain Management:    Induction:   PONV Risk Score and Plan:   Airway Management  Planned: Natural Airway and Nasal Cannula  Additional Equipment:   Intra-op Plan:   Post-operative Plan:   Informed Consent: I have reviewed the patients History and Physical, chart, labs and discussed the procedure including the risks, benefits and alternatives for the proposed anesthesia with the patient or authorized representative who has indicated his/her understanding and acceptance.     Dental Advisory Given  Plan Discussed with: Anesthesiologist, CRNA and Surgeon  Anesthesia Plan Comments: (Patient reports no bleeding problems and no anticoagulant use.  Plan for spinal with backup GA  Patient consented for risks of anesthesia including but not limited to:  - adverse reactions to medications - risk of bleeding, infection, nerve damage and headache - risk of failed spinal - damage to teeth, lips or other oral mucosa - sore throat or hoarseness - Damage to heart, brain, lungs or loss of life  Patient voiced  understanding.)        Anesthesia Quick Evaluation

## 2019-06-27 NOTE — Progress Notes (Signed)
PT walking with patient with walker

## 2019-07-25 ENCOUNTER — Encounter: Payer: Self-pay | Admitting: Emergency Medicine

## 2019-07-25 ENCOUNTER — Other Ambulatory Visit: Payer: Self-pay

## 2019-07-25 ENCOUNTER — Inpatient Hospital Stay: Payer: Medicare PPO

## 2019-07-25 ENCOUNTER — Ambulatory Visit
Admission: EM | Admit: 2019-07-25 | Discharge: 2019-07-25 | Disposition: A | Discharge status: Home / Self Care | Payer: Medicare PPO | Source: Home / Self Care | Attending: Family Medicine | Admitting: Family Medicine

## 2019-07-25 ENCOUNTER — Emergency Department: Payer: Medicare PPO

## 2019-07-25 ENCOUNTER — Inpatient Hospital Stay
Admission: EM | Admit: 2019-07-25 | Discharge: 2019-07-26 | DRG: 066 | Disposition: A | Discharge status: Home / Self Care | Payer: Medicare PPO | Attending: Internal Medicine | Admitting: Internal Medicine

## 2019-07-25 ENCOUNTER — Encounter: Payer: Self-pay | Admitting: Intensive Care

## 2019-07-25 DIAGNOSIS — H539 Unspecified visual disturbance: Secondary | ICD-10-CM | POA: Diagnosis not present

## 2019-07-25 DIAGNOSIS — Z20822 Contact with and (suspected) exposure to covid-19: Secondary | ICD-10-CM | POA: Diagnosis present

## 2019-07-25 DIAGNOSIS — K219 Gastro-esophageal reflux disease without esophagitis: Secondary | ICD-10-CM | POA: Diagnosis present

## 2019-07-25 DIAGNOSIS — Z8673 Personal history of transient ischemic attack (TIA), and cerebral infarction without residual deficits: Secondary | ICD-10-CM | POA: Diagnosis not present

## 2019-07-25 DIAGNOSIS — Z79899 Other long term (current) drug therapy: Secondary | ICD-10-CM

## 2019-07-25 DIAGNOSIS — Z7901 Long term (current) use of anticoagulants: Secondary | ICD-10-CM

## 2019-07-25 DIAGNOSIS — Z85828 Personal history of other malignant neoplasm of skin: Secondary | ICD-10-CM

## 2019-07-25 DIAGNOSIS — Z96653 Presence of artificial knee joint, bilateral: Secondary | ICD-10-CM | POA: Diagnosis present

## 2019-07-25 DIAGNOSIS — I2510 Atherosclerotic heart disease of native coronary artery without angina pectoris: Secondary | ICD-10-CM | POA: Diagnosis present

## 2019-07-25 DIAGNOSIS — I639 Cerebral infarction, unspecified: Secondary | ICD-10-CM | POA: Diagnosis present

## 2019-07-25 DIAGNOSIS — E785 Hyperlipidemia, unspecified: Secondary | ICD-10-CM | POA: Insufficient documentation

## 2019-07-25 DIAGNOSIS — Z802 Family history of malignant neoplasm of other respiratory and intrathoracic organs: Secondary | ICD-10-CM

## 2019-07-25 DIAGNOSIS — R200 Anesthesia of skin: Secondary | ICD-10-CM | POA: Diagnosis not present

## 2019-07-25 DIAGNOSIS — Z66 Do not resuscitate: Secondary | ICD-10-CM | POA: Diagnosis present

## 2019-07-25 DIAGNOSIS — H53461 Homonymous bilateral field defects, right side: Secondary | ICD-10-CM | POA: Diagnosis present

## 2019-07-25 DIAGNOSIS — Z8249 Family history of ischemic heart disease and other diseases of the circulatory system: Secondary | ICD-10-CM | POA: Diagnosis not present

## 2019-07-25 DIAGNOSIS — Z79891 Long term (current) use of opiate analgesic: Secondary | ICD-10-CM

## 2019-07-25 DIAGNOSIS — R2681 Unsteadiness on feet: Secondary | ICD-10-CM | POA: Diagnosis present

## 2019-07-25 DIAGNOSIS — Z79890 Hormone replacement therapy (postmenopausal): Secondary | ICD-10-CM

## 2019-07-25 DIAGNOSIS — Z8719 Personal history of other diseases of the digestive system: Secondary | ICD-10-CM | POA: Diagnosis not present

## 2019-07-25 DIAGNOSIS — Z86010 Personal history of colonic polyps: Secondary | ICD-10-CM

## 2019-07-25 DIAGNOSIS — I10 Essential (primary) hypertension: Secondary | ICD-10-CM | POA: Diagnosis present

## 2019-07-25 DIAGNOSIS — R42 Dizziness and giddiness: Secondary | ICD-10-CM | POA: Diagnosis present

## 2019-07-25 DIAGNOSIS — E039 Hypothyroidism, unspecified: Secondary | ICD-10-CM | POA: Diagnosis present

## 2019-07-25 DIAGNOSIS — Z7982 Long term (current) use of aspirin: Secondary | ICD-10-CM

## 2019-07-25 DIAGNOSIS — M169 Osteoarthritis of hip, unspecified: Secondary | ICD-10-CM

## 2019-07-25 DIAGNOSIS — Z87891 Personal history of nicotine dependence: Secondary | ICD-10-CM

## 2019-07-25 DIAGNOSIS — Z951 Presence of aortocoronary bypass graft: Secondary | ICD-10-CM

## 2019-07-25 DIAGNOSIS — R29700 NIHSS score 0: Secondary | ICD-10-CM | POA: Diagnosis present

## 2019-07-25 DIAGNOSIS — Z9852 Vasectomy status: Secondary | ICD-10-CM | POA: Diagnosis not present

## 2019-07-25 DIAGNOSIS — I6381 Other cerebral infarction due to occlusion or stenosis of small artery: Secondary | ICD-10-CM

## 2019-07-25 HISTORY — DX: Other cerebral infarction due to occlusion or stenosis of small artery: I63.81

## 2019-07-25 HISTORY — DX: Essential (primary) hypertension: I10

## 2019-07-25 HISTORY — DX: Cerebral infarction, unspecified: I63.9

## 2019-07-25 LAB — COMPREHENSIVE METABOLIC PANEL
ALT: 30 U/L (ref 0–44)
AST: 24 U/L (ref 15–41)
Albumin: 4.2 g/dL (ref 3.5–5.0)
Alkaline Phosphatase: 140 U/L — ABNORMAL HIGH (ref 38–126)
Anion gap: 14 (ref 5–15)
BUN: 13 mg/dL (ref 8–23)
CO2: 26 mmol/L (ref 22–32)
Calcium: 9.5 mg/dL (ref 8.9–10.3)
Chloride: 99 mmol/L (ref 98–111)
Creatinine, Ser: 0.79 mg/dL (ref 0.61–1.24)
GFR calc Af Amer: >60 mL/min (ref >60)
GFR calc non Af Amer: >60 mL/min (ref >60)
Glucose, Bld: 106 mg/dL — ABNORMAL HIGH (ref 70–99)
Potassium: 4.1 mmol/L (ref 3.5–5.1)
Sodium: 139 mmol/L (ref 135–145)
Total Bilirubin: 0.6 mg/dL (ref 0.3–1.2)
Total Protein: 8.1 g/dL (ref 6.5–8.1)

## 2019-07-25 LAB — CBC
HCT: 43.2 % (ref 39.0–52.0)
Hemoglobin: 14.1 g/dL (ref 13.0–17.0)
MCH: 27.8 pg (ref 26.0–34.0)
MCHC: 32.6 g/dL (ref 30.0–36.0)
MCV: 85.2 fL (ref 80.0–100.0)
Platelets: 306 10*3/uL (ref 150–400)
RBC: 5.07 MIL/uL (ref 4.22–5.81)
RDW: 16.4 % — ABNORMAL HIGH (ref 11.5–15.5)
WBC: 7.3 10*3/uL (ref 4.0–10.5)
nRBC: 0 % (ref 0.0–0.2)

## 2019-07-25 LAB — DIFFERENTIAL
Abs Immature Granulocytes: 0.03 10*3/uL (ref 0.00–0.07)
Basophils Absolute: 0 10*3/uL (ref 0.0–0.1)
Basophils Relative: 0 %
Eosinophils Absolute: 0.3 10*3/uL (ref 0.0–0.5)
Eosinophils Relative: 3 %
Immature Granulocytes: 0 %
Lymphocytes Relative: 19 %
Lymphs Abs: 1.4 10*3/uL (ref 0.7–4.0)
Monocytes Absolute: 0.5 10*3/uL (ref 0.1–1.0)
Monocytes Relative: 7 %
Neutro Abs: 5.2 10*3/uL (ref 1.7–7.7)
Neutrophils Relative %: 71 %

## 2019-07-25 LAB — LIPID PANEL
Cholesterol: 207 mg/dL — ABNORMAL HIGH (ref 0–200)
HDL: 32 mg/dL — ABNORMAL LOW (ref >40)
LDL Cholesterol: 108 mg/dL — ABNORMAL HIGH (ref 0–99)
Total CHOL/HDL Ratio: 6.5 RATIO
Triglycerides: 333 mg/dL — ABNORMAL HIGH (ref <150)
VLDL: 67 mg/dL — ABNORMAL HIGH (ref 0–40)

## 2019-07-25 LAB — PROTIME-INR
INR: 1 (ref 0.8–1.2)
Prothrombin Time: 12.6 seconds (ref 11.4–15.2)

## 2019-07-25 LAB — APTT: aPTT: 26 seconds (ref 24–36)

## 2019-07-25 MED ORDER — TAMSULOSIN HCL 0.4 MG PO CAPS
0.4000 mg | ORAL_CAPSULE | Freq: Every day | ORAL | Status: DC
  Administered 2019-07-25: 0.4 mg via ORAL
  Filled 2019-07-25: qty 1

## 2019-07-25 MED ORDER — LEVOTHYROXINE SODIUM 150 MCG PO TABS
150.0000 ug | ORAL_TABLET | Freq: Every day | ORAL | Status: DC
  Administered 2019-07-26: 150 ug via ORAL
  Filled 2019-07-25: qty 1
  Filled 2019-07-25: qty 3

## 2019-07-25 MED ORDER — SODIUM CHLORIDE 0.9 % IV SOLN: INTRAVENOUS | Status: DC

## 2019-07-25 MED ORDER — PANTOPRAZOLE SODIUM 40 MG PO TBEC
40.0000 mg | DELAYED_RELEASE_TABLET | Freq: Every day | ORAL | Status: DC
  Administered 2019-07-26: 40 mg via ORAL
  Filled 2019-07-25: qty 1

## 2019-07-25 MED ORDER — ATORVASTATIN CALCIUM 20 MG PO TABS
80.0000 mg | ORAL_TABLET | Freq: Every day | ORAL | Status: DC
  Administered 2019-07-25: 80 mg via ORAL
  Filled 2019-07-25: qty 4

## 2019-07-25 MED ORDER — ACETAMINOPHEN 160 MG/5ML PO SOLN
650.0000 mg | ORAL | Status: DC | PRN
  Filled 2019-07-25: qty 20.3

## 2019-07-25 MED ORDER — IOHEXOL 350 MG/ML SOLN
75.0000 mL | Freq: Once | INTRAVENOUS | Status: AC | PRN
  Administered 2019-07-25: 75 mL via INTRAVENOUS

## 2019-07-25 MED ORDER — ACETAMINOPHEN 650 MG RE SUPP: 650.0000 mg | RECTAL | Status: DC | PRN

## 2019-07-25 MED ORDER — ACETAMINOPHEN 325 MG PO TABS: 650.0000 mg | ORAL_TABLET | ORAL | Status: DC | PRN

## 2019-07-25 MED ORDER — OMEGA-3-ACID ETHYL ESTERS 1 G PO CAPS
1000.0000 mg | ORAL_CAPSULE | Freq: Every morning | ORAL | Status: DC
  Administered 2019-07-26: 1000 mg via ORAL
  Filled 2019-07-25: qty 1

## 2019-07-25 MED ORDER — IRBESARTAN 150 MG PO TABS
75.0000 mg | ORAL_TABLET | Freq: Every day | ORAL | Status: DC
  Administered 2019-07-26: 75 mg via ORAL
  Filled 2019-07-25: qty 1

## 2019-07-25 MED ORDER — CLOPIDOGREL BISULFATE 75 MG PO TABS
75.0000 mg | ORAL_TABLET | Freq: Every day | ORAL | Status: DC
  Administered 2019-07-25 – 2019-07-26 (×2): 75 mg via ORAL
  Filled 2019-07-25 (×2): qty 1

## 2019-07-25 MED ORDER — STROKE: EARLY STAGES OF RECOVERY BOOK: Freq: Once | Status: AC

## 2019-07-25 MED ORDER — ASPIRIN EC 81 MG PO TBEC
325.0000 mg | DELAYED_RELEASE_TABLET | Freq: Every day | ORAL | Status: DC
  Administered 2019-07-26: 324 mg via ORAL
  Filled 2019-07-25: qty 5

## 2019-07-25 MED ORDER — ENOXAPARIN SODIUM 40 MG/0.4ML ~~LOC~~ SOLN
40.0000 mg | SUBCUTANEOUS | Status: DC
  Administered 2019-07-25: 40 mg via SUBCUTANEOUS
  Filled 2019-07-25: qty 0.4

## 2019-07-25 MED ORDER — METOPROLOL TARTRATE 25 MG PO TABS
25.0000 mg | ORAL_TABLET | Freq: Two times a day (BID) | ORAL | Status: DC
  Administered 2019-07-25 – 2019-07-26 (×2): 25 mg via ORAL
  Filled 2019-07-25 (×2): qty 1

## 2019-07-25 NOTE — ED Notes (Signed)
Patient transferred to CT

## 2019-07-25 NOTE — ED Provider Notes (Signed)
[facility] Provider Note  ____________________________________________   First MD Initiated Contact with Patient 07/25/19 1150     (approximate)  I have reviewed the triage vital signs and the nursing notes.   HISTORY  Chief Complaint Numbness    HPI [facility] is a 79 y.o. male  With PMHx below including CVA, HTN, HLD, CAD, here with numbness, difficulty walking.   Patient states his symptoms started yesterday.  He states that he began to notice slight worsening of some chronic blurry and dark vision in his right eye yesterday.  He also noticed that he had loss of balance and generalized weakness the legs.  Difficulty walking.  He required assistance from his wife to walk to bed.  When he woke this morning, he had persistent, generalized weakness which he also describes more so as a loss of balance.  He has noticed some worsening vision in his right eye as well.  Denies any overt numbness.  No facial numbness or weakness.  No dysphagia or dysarthria.  No fevers or chills.  Nursing medication change.  No other acute complaints.       Past Medical History:  Diagnosis Date  . Arthritis   . Barrett esophagus   . Benign neoplasm of abdomen   . Benign neoplasm of colon   . Cancer ([place])    skin cancer  . Coronary artery disease   . GERD (gastroesophageal reflux disease)   . Heart murmur    ASYMPTOMATIC  . History of hiatal hernia   . Hypercholesterolemia   . Hypertension   . Hypothyroidism   . Stroke [facility]) 2015   mini stroke    Patient Active Problem List   Diagnosis Date Noted  . 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 Hematochezia 12/30/2017  . Hypothyroidism 12/30/2017  . 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 Benign neoplasm of colon 08/21/2013  . CAD in native artery 08/21/2013  . Diverticulitis of colon  08/21/2013  . GERD (gastroesophageal reflux disease) 08/21/2013  . Benign essential HTN 08/21/2013  . Anal bleeding 08/21/2013  . Arthritis, degenerative 08/21/2013  . 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 Pure hypercholesterolemia 08/21/2013  . Adenomatous colon polyp 08/21/2013  . Osteoarthrosis, unspecified whether generalized or localized, pelvic region and thigh 08/21/2013    Past Surgical History:  Procedure Laterality Date  . CARDIAC CATHETERIZATION    . COLONOSCOPY WITH PROPOFOL N/A 01/01/2018   Procedure: COLONOSCOPY WITH PROPOFOL;  Surgeon: Jonathon Bellows, MD;  Location: [facility];  Service: Gastroenterology;  Laterality: N/A;  . CORONARY ARTERY BYPASS GRAFT  08/2017   4 vessels  . ESOPHAGOGASTRODUODENOSCOPY (EGD) WITH PROPOFOL N/A 10/28/2015   Procedure: ESOPHAGOGASTRODUODENOSCOPY (EGD) WITH PROPOFOL;  Surgeon: Hulen Luster, MD;  Location[facility] ENDOSCOPY;  Service: Gastroenterology;  Laterality: N/A;  . KNEE ARTHROSCOPY    . LEFT HEART CATH AND CORONARY ANGIOGRAPHY N/A 07/25/2017   Procedure: LEFT HEART CATH AND CORONARY ANGIOGRAPHY;  Surgeon: Teodoro Spray, MD;  Location: [facility] LAB;  Service: Cardiovascular;  Laterality: N/A;  . NASAL SINUS SURGERY    . PARTIAL KNEE ARTHROPLASTY Right 06/27/2019   Procedure: UNICOMPARTMENTAL KNEE;  Surgeon: Corky Mull, MD;  Location: [facility] ORS;  Service: Orthopedics;  Laterality: Right;  . TOTAL KNEE ARTHROPLASTY Left 12/19/2018   Procedure: TOTAL KNEE ARTHROPLASTY;  Surgeon: Corky Mull, MD;  Location: [facility]  ORS;  Service: Orthopedics;  Laterality: Left;  Marland Kitchen VASECTOMY      Prior to Admission medications   Medication Sig Start Date End Date Taking? Authorizing Provider  apixaban (ELIQUIS) 2.5 MG TABS tablet Take 1 tablet (2.5 mg total) by mouth 2 (two) times daily. 06/27/19   Poggi, Marshall Cork, MD  aspirin (ASPIRIN EC) 81 MG EC tablet Take 325 mg by mouth daily.     [provider]  atorvastatin (LIPITOR) 80 MG tablet Take 80  mg by mouth at bedtime.     [provider]  fluticasone (FLONASE) 50 MCG/ACT nasal spray Place 1 spray into both nostrils as needed.     [provider]  levothyroxine (SYNTHROID, LEVOTHROID) 150 MCG tablet Take 150 mcg by mouth daily before breakfast.    [provider]  metoprolol tartrate (LOPRESSOR) 25 MG tablet Take 25 mg by mouth 2 (two) times daily.  09/03/17   [provider]  Omega-3 Fatty Acids (FISH OIL) 1200 MG CAPS Take 1,200 mg by mouth 2 (two) times a day.     [provider]  omeprazole (PRILOSEC) 20 MG capsule Take 20 mg by mouth every morning.     [provider]  tamsulosin (FLOMAX) 0.4 MG CAPS capsule Take 2 capsules (0.8 mg total) by mouth at bedtime. Patient taking differently: Take 0.4 mg by mouth at bedtime.  12/30/18   Bjorn Loser, MD  telmisartan (MICARDIS) 40 MG tablet Take 40 mg by mouth every morning.     [provider]  traMADol (ULTRAM) 50 MG tablet Take 1 tablet (50 mg total) by mouth every 6 (six) hours as needed for moderate pain or severe pain. 06/27/19   Poggi, Marshall Cork, MD    Allergies Patient has no known allergies.  Family History  Problem Relation Age of Onset  . Heart attack Mother   . Lung disease Father     Social History Social History   Tobacco Use  . Smoking status: Former Smoker    Packs/day: 1.00    Years: 15.00    Pack years: 15.00    Quit date: 06/06/1971    Years since quitting: 48.1  . Smokeless tobacco: Former Network engineer Use Topics  . Alcohol use: Yes    Alcohol/week: 1.0 standard drinks    Types: 1 Cans of beer per week    Comment: 4 to 5 times per week  . Drug use: No    Review of Systems  Review of Systems  Constitutional: Positive for fatigue. Negative for chills and fever.  HENT: Negative for sore throat.   Respiratory: Negative for shortness of breath.   Cardiovascular: Negative for chest pain.  Gastrointestinal: Negative for abdominal pain.   Genitourinary: Negative for flank pain.  Musculoskeletal: Positive for gait problem. Negative for neck pain.  Skin: Negative for rash and wound.  Allergic/Immunologic: Negative for immunocompromised state.  Neurological: Positive for dizziness and weakness. Negative for numbness.  Hematological: Does not bruise/bleed easily.  All other systems reviewed and are negative.    ____________________________________________  PHYSICAL EXAM:      VITAL SIGNS: ED Triage Vitals  Enc Vitals Group     BP 07/25/19 1103 (!) 148/71     Pulse Rate 07/25/19 1103 (!) 56     Resp 07/25/19 1103 16     Temp 07/25/19 1103 98 F (36.7 C)     Temp Source 07/25/19 1103 Oral     SpO2 07/25/19 1103 96 %  Weight 07/25/19 1104 179 lb (81.2 kg)     Height 07/25/19 1104 5\' 4"  (1.626 m)     Head Circumference --      Peak Flow --      Pain Score 07/25/19 1104 0     Pain Loc --      Pain Edu? --      Excl. in [place]? --      Physical Exam Vitals and nursing note reviewed.  Constitutional:      General: He is not in acute distress.    Appearance: He is well-developed.  HENT:     Head: Normocephalic and atraumatic.  Eyes:     Extraocular Movements: Extraocular movements intact.     Conjunctiva/sclera: Conjunctivae normal.     Pupils: Pupils are equal, round, and reactive to light.     Comments: Occasional slight disconjugate gaze when looking rightward, which corrects  Cardiovascular:     Rate and Rhythm: Normal rate and regular rhythm.     Heart sounds: Normal heart sounds.  Pulmonary:     Effort: Pulmonary effort is normal. No respiratory distress.     Breath sounds: No wheezing.  Abdominal:     General: There is no distension.  Musculoskeletal:     Cervical back: Neck supple.  Skin:    General: Skin is warm.     Capillary Refill: Capillary refill takes less than 2 seconds.     Findings: No rash.  Neurological:     Mental Status: He is alert and oriented to person, place, and time.      Motor: No abnormal muscle tone.     Comments: Neurological Exam:  Mental Status: Alert and oriented to person, place, and time. Attention and concentration normal. Speech clear. Recent memory is intact. Cranial Nerves: Visual fields grossly intact. EOMI and PERRLA. No nystagmus noted. Facial sensation intact at forehead, maxillary cheek, and chin/mandible bilaterally. No facial asymmetry or weakness. Hearing grossly normal. Uvula is midline, and palate elevates symmetrically. Normal SCM and trapezius strength. Tongue midline without fasciculations. Motor: Muscle strength 5/5 in proximal and distal UE and LE bilaterally. No pronator drift. Muscle tone normal.  Sensation: Intact to light touch in upper and lower extremities distally bilaterally.  Gait: Normal without ataxia. Coordination: Normal FTN bilaterally.          ____________________________________________   LABS (all labs ordered are listed, but only abnormal results are displayed)  Labs Reviewed  CBC - Abnormal; Notable for the following components:      Result Value   RDW 16.4 (*)    All other components within normal limits  COMPREHENSIVE METABOLIC PANEL - Abnormal; Notable for the following components:   Glucose, Bld 106 (*)    Alkaline Phosphatase 140 (*)    All other components within normal limits  PROTIME-INR  APTT  DIFFERENTIAL  CBG MONITORING, ED    ____________________________________________  EKG: normal sinus rhythm, VR 60. PR 150, QRS 78, QTc 422. TWI noted in inferolateral leads, when compared to 06/25/19 no significant change. No evidence of acute ischemia or infarct when compared to prior. ________________________________________  RADIOLOGY All imaging, including plain films, CT scans, and ultrasounds, independently reviewed by me, and interpretations confirmed via formal radiology reads.  ED MD interpretation:   CT Head: [place]  Official radiology report(s): CT HEAD WO CONTRAST  Result Date:  07/25/2019 CLINICAL DATA:  Vision loss, monocular, possible stroke. Additional history provided by technologist: Patient reports trouble walking, numbness to right ear and back  of head that started around 2 p.m. 07/24/2019, stroke in the past with resultant right eye vision changes with vision somewhat worse yesterday EXAM: CT HEAD WITHOUT CONTRAST TECHNIQUE: Contiguous axial images were obtained from the base of the skull through the vertex without intravenous contrast. COMPARISON:  Brain MRI 03/05/2015 FINDINGS: Brain: No evidence of acute intracranial hemorrhage. No demarcated cortical infarction. No midline shift or extra-axial fluid collection. 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 Mild scattered T2/FLAIR hyperintensity within the cerebral white matter is nonspecific, but consistent with chronic small vessel ischemic disease. Redemonstrated prominent perivascular space within the inferolateral left basal ganglia (series 2, image 14). Mild generalized parenchymal atrophy. Vascular: No hyperdense vessel. Skull: Normal. Negative for fracture or focal lesion. Sinuses/Orbits: Visualized orbits demonstrate no acute abnormality. Postsurgical appearance of the paranasal sinuses. Minimal scattered paranasal sinus mucosal thickening with redemonstrated hypoplastic right maxillary sinus. No significant mastoid effusion. IMPRESSION: 1. No evidence of acute intracranial abnormality. 2. 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 3. Mild generalized parenchymal atrophy and chronic small vessel ischemic disease. Electronically Signed   By: Kellie Simmering DO   On: 07/25/2019 12:19   MR BRAIN WO CONTRAST  Result Date: 07/25/2019 CLINICAL DATA:  New onset of posterior head numbness on the right, mild dizziness, and blurred vision since noon yesterday. EXAM: MRI HEAD WITHOUT CONTRAST TECHNIQUE:  Multiplanar, multiecho pulse sequences of the brain and surrounding structures were obtained without intravenous contrast. COMPARISON:  CT head without contrast 07/25/2019. MR head without and with contrast 03/05/2015. FINDINGS: Brain: This diffusion-weighted images demonstrate acute left lateral pontine infarct measuring just over 2 cm anterior-posterior. Height is approximately 10 mm. No other acute infarcts are present. There is no associated hemorrhage. Moderate atrophy and diffuse white matter disease is present. Progressive subcortical white matter disease is present bilaterally. The ventricles are proportionate to the degree of atrophy. Remote lacunar infarcts are present in the posterior right lentiform nucleus. Dilated perivascular spaces are present in the basal ganglia. Remote nonhemorrhagic lacunar infarcts are present in the thalami bilaterally. T2 signal changes are associated with the acute infarct. More inferior central and posterolateral remote lacunar infarcts are present within the lower pons. The cerebellum is within normal limits. Vascular: Abnormal signal is present in the non dominant left vertebral artery, suggesting thrombus. The right vertebral artery and basilar artery are patent. Fetal type posterior cerebral arteries are present bilaterally. Flow is present within the anterior circulation. Skull and upper cervical spine: The craniocervical junction is normal. Upper cervical spine is within normal limits. Marrow signal is unremarkable. Sinuses/Orbits: Patient is status post bilateral maxillary antrostomies and partial ethmoidectomies. Mild residual mucosal thickening is present within the ethmoid air cells. The right maxillary sinus is shrunken. The mastoid air cells are clear. IMPRESSION: 1. 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 2. Abnormal signal in the non dominant left vertebral artery, suggesting thrombus. 3. Progressive atrophy and diffuse white matter disease likely  reflects the sequela of chronic microvascular ischemia. 4. Remote lacunar infarcts of the right lentiform nucleus and bilateral thalami. Electronically Signed   By: San Morelle M.D.   On: 07/25/2019 13:55    ____________________________________________  PROCEDURES   Procedure(s) performed (including Critical Care):  Procedures  ____________________________________________  INITIAL IMPRESSION / MDM / [place] / ED COURSE  As part of my medical decision making, I reviewed the following data within the  electronic MEDICAL RECORD NUMBER Nursing notes reviewed and incorporated, Old chart reviewed, Notes from prior ED visits, and [place] Controlled Substance Database       *TRAYTEN ARTH was evaluated in Emergency Department on 07/25/2019 for the symptoms described in the history of present illness. He was evaluated in the context of the global COVID-19 pandemic, which necessitated consideration that the patient might be at risk for infection with the SARS-CoV-2 virus that causes COVID-19. Institutional protocols and algorithms that pertain to the evaluation of patients at risk for COVID-19 are in a state of rapid change based on information released by regulatory bodies including the CDC and federal and state organizations. These policies and algorithms were followed during the patient's care in the ED.  Some ED evaluations and interventions may be delayed as a result of limited staffing during the pandemic.*     Medical Decision Making: 79 year old male here with subjective decreased right upper visual acuity, loss of balance and leg weakness.  He has alert, oriented, well-appearing on exam.  He has some mild diplopia noted with lateral gaze, subjective weakness in his legs and loss of balance, but otherwise no significant focal deficits.  CT head negative.  MRI obtained and shows new pontine infarct with vertebral occlusion.  Discussed with neurology Dr. Doy Mince, will admit to  medicine.  ____________________________________________  FINAL CLINICAL IMPRESSION(S) / ED DIAGNOSES  Final diagnoses:  Acute CVA (cerebrovascular accident) [facility])     MEDICATIONS GIVEN DURING THIS VISIT:  Medications - No data to display   ED Discharge Orders    None       Note:  This document was prepared using Dragon voice recognition software and may include unintentional dictation errors.   Duffy Bruce, MD 07/25/19 [phone]

## 2019-07-25 NOTE — ED Triage Notes (Signed)
Patient c/o trouble walking, numbness to right ear and back of head that started around 2pm 07/24/19 Thursday. Stroke in past left him with Right eye vision changes and patient reports vision seemed a little worse yesterday in right eye during other symptoms. Reports he is still having trouble walking today and the numbness. HX partial right knee replacement X1 month ago, CD, and stroke years ago.

## 2019-07-25 NOTE — ED Notes (Signed)
Pt ambulated to the bathroom to void, pt required assist from me but was able to ambulate.

## 2019-07-25 NOTE — H&P (Signed)
[facility] NAME: Warren Ingram    MR#:  [id number]  DATE OF BIRTH:  Mar 31, 1941  DATE OF ADMISSION:  07/25/2019  PRIMARY CARE PHYSICIAN: Idelle Crouch, MD   REQUESTING/REFERRING PHYSICIAN: Dr. Duffy Bruce  Patient coming from : home   CHIEF COMPLAINT:  difficulty ambulating with unsteady gait and issues with balance since yesterday  HISTORY OF PRESENT ILLNESS:  Warren Ingram  is a 79 y.o. male with a known history of prior stroke, hypertension, hyperlipidemia, Jerrye Bushy, arthritis comes to the emergency room after he began to notice he was off balance with his gait. He also said noticed some change in vision and his right eye and numbness on his right side of his head. Decided to wait and see if it improves how were symptoms did not improve and given issues with his balance came to the emergency room.  ED course: in the ER workup shows patient has acute left Pons stroke. Patient takes aspirin at home no focal weakness. Vision is stable. Patient's heart rate is 63 blood pressure is 150/74. Sats are 99% on room air. Was outside the window for TPA. He was evaluated by Dr. Doy Mince in the ER  COVID testing is pending PAST MEDICAL HISTORY:   Past Medical History:  Diagnosis Date  . Arthritis   . Barrett esophagus   . Benign neoplasm of abdomen   . Benign neoplasm of colon   . Cancer ([place])    skin cancer  . Coronary artery disease   . GERD (gastroesophageal reflux disease)   . Heart murmur    ASYMPTOMATIC  . History of hiatal hernia   . Hypercholesterolemia   . Hypertension   . Hypothyroidism   . Stroke [facility]) 2015   mini stroke    PAST SURGICAL HISTOIRY:   Past Surgical History:  Procedure Laterality Date  . CARDIAC CATHETERIZATION    . COLONOSCOPY WITH PROPOFOL N/A 01/01/2018   Procedure: COLONOSCOPY WITH PROPOFOL;  Surgeon: Jonathon Bellows, MD;  Location: [facility] ENDOSCOPY;  Service: Gastroenterology;   Laterality: N/A;  . CORONARY ARTERY BYPASS GRAFT  08/2017   4 vessels  . ESOPHAGOGASTRODUODENOSCOPY (EGD) WITH PROPOFOL N/A 10/28/2015   Procedure: ESOPHAGOGASTRODUODENOSCOPY (EGD) WITH PROPOFOL;  Surgeon: Hulen Luster, MD;  Location: [facility] ENDOSCOPY;  Service: Gastroenterology;  Laterality: N/A;  . KNEE ARTHROSCOPY    . LEFT HEART CATH AND CORONARY ANGIOGRAPHY N/A 07/25/2017   Procedure: LEFT HEART CATH AND CORONARY ANGIOGRAPHY;  Surgeon: Teodoro Spray, MD;  Location: [place] CV LAB;  Service: Cardiovascular;  Laterality: N/A;  . NASAL SINUS SURGERY    . PARTIAL KNEE ARTHROPLASTY Right 06/27/2019   Procedure: UNICOMPARTMENTAL KNEE;  Surgeon: Corky Mull, MD;  Location: [facility] ORS;  Service: Orthopedics;  Laterality: Right;  . TOTAL KNEE ARTHROPLASTY Left 12/19/2018   Procedure: TOTAL KNEE ARTHROPLASTY;  Surgeon: Corky Mull, MD;  Location: [facility] ORS;  Service: Orthopedics;  Laterality: Left;  Marland Kitchen VASECTOMY      SOCIAL HISTORY:   Social History   Tobacco Use  . Smoking status: Former Smoker    Packs/day: 1.00    Years: 15.00    Pack years: 15.00    Quit date: 06/06/1971    Years since quitting: 48.1  . Smokeless tobacco: Former Network engineer Use Topics  . Alcohol use: Yes    Alcohol/week: 1.0 standard drinks    Types: 1 Cans of beer per week    Comment: 4 to  5 times per week    FAMILY HISTORY:   Family History  Problem Relation Age of Onset  . Heart attack Mother   . Lung disease Father     DRUG ALLERGIES:  No Known Allergies  REVIEW OF SYSTEMS:  Review of Systems  Constitutional: Positive for malaise/fatigue. Negative for chills, fever and weight loss.  HENT: Negative for ear discharge, ear pain and nosebleeds.   Eyes: Negative for blurred vision, pain and discharge.  Respiratory: Negative for sputum production, shortness of breath, wheezing and stridor.   Cardiovascular: Negative for chest pain, palpitations, orthopnea and PND.  Gastrointestinal: Negative for  abdominal pain, diarrhea, nausea and vomiting.  Genitourinary: Negative for frequency and urgency.  Musculoskeletal: Negative for back pain and joint pain.  Neurological: Positive for dizziness and weakness. Negative for sensory change, speech change and focal weakness.  Psychiatric/Behavioral: Negative for depression and hallucinations. The patient is not nervous/anxious.      MEDICATIONS AT HOME:   Prior to Admission medications   Medication Sig Start Date End Date Taking? Authorizing Provider  aspirin EC 325 MG tablet Take 325 mg by mouth daily.   Yes [provider]  atorvastatin (LIPITOR) 80 MG tablet Take 80 mg by mouth at bedtime.    Yes [provider]  levothyroxine (SYNTHROID, LEVOTHROID) 150 MCG tablet Take 150 mcg by mouth daily before breakfast.   Yes [provider]  metoprolol tartrate (LOPRESSOR) 25 MG tablet Take 25 mg by mouth 2 (two) times daily.  09/03/17  Yes [provider]  Omega-3 Fatty Acids (FISH OIL) 1200 MG CAPS Take 1,200 mg by mouth 2 (two) times a day.    Yes [provider]  omeprazole (PRILOSEC) 20 MG capsule Take 20 mg by mouth every morning.    Yes [provider]  tamsulosin (FLOMAX) 0.4 MG CAPS capsule Take 2 capsules (0.8 mg total) by mouth at bedtime. Patient taking differently: Take 0.4 mg by mouth at bedtime.  12/30/18  Yes MacDiarmid, Nicki Reaper, MD  telmisartan (MICARDIS) 40 MG tablet Take 40 mg by mouth every morning.    Yes [provider]  aspirin (ASPIRIN EC) 81 MG EC tablet Take 325 mg by mouth daily.     [provider]  fluticasone (FLONASE) 50 MCG/ACT nasal spray Place 1 spray into both nostrils as needed.     [provider]  traMADol (ULTRAM) 50 MG tablet Take 1 tablet (50 mg total) by mouth every 6 (six) hours as needed for moderate pain or severe pain. 06/27/19   Poggi, Marshall Cork, MD      VITAL SIGNS:  Blood pressure (!) 150/74, pulse (!) 52, temperature 98 F (36.7  C), temperature source Oral, resp. rate 13, height 5\' 4"  (1.626 m), weight 81.2 kg, SpO2 99 %.  PHYSICAL EXAMINATION:  GENERAL:  79 y.o.-year-old patient lying in the bed with no acute distress.  EYES: Pupils equal, round, reactive to light and accommodation. No scleral icterus.  HEENT: Head atraumatic, normocephalic. Oropharynx and nasopharynx clear.  NECK:  Supple, no jugular venous distention. No thyroid enlargement, no tenderness.  LUNGS: Normal breath sounds bilaterally, no wheezing, rales,rhonchi or crepitation. No use of accessory muscles of respiration.  CARDIOVASCULAR: S1, S2 normal. No murmurs, rubs, or gallops.  ABDOMEN: Soft, nontender, nondistended. Bowel sounds present. No organomegaly or mass.  EXTREMITIES: No pedal edema, cyanosis, or clubbing.  NEUROLOGIC: Cranial nerves II through XII are intact. Muscle strength 5/5 in all extremities. Sensation intact. Gait not checked. no  Focal weakness. Gait not tested due to safety reasons PSYCHIATRIC: The patient is alert and oriented x 3.  SKIN: No obvious rash, lesion, or ulcer.   LABORATORY PANEL:   CBC Recent Labs  Lab 07/25/19 1117  WBC 7.3  HGB 14.1  HCT 43.2  PLT 306   ------------------------------------------------------------------------------------------------------------------  Chemistries  Recent Labs  Lab 07/25/19 1117  NA 139  K 4.1  CL 99  CO2 26  GLUCOSE 106*  BUN 13  CREATININE 0.79  CALCIUM 9.5  AST 24  ALT 30  ALKPHOS 140*  BILITOT 0.6   ------------------------------------------------------------------------------------------------------------------  Cardiac Enzymes No results for input(s): TROPONINI in the last 168 hours. ------------------------------------------------------------------------------------------------------------------  RADIOLOGY:  CT HEAD WO CONTRAST  Result Date: 07/25/2019 CLINICAL DATA:  Vision loss, monocular, possible stroke. Additional history provided by  technologist: Patient reports trouble walking, numbness to right ear and back of head that started around 2 p.m. 07/24/2019, stroke in the past with resultant right eye vision changes with vision somewhat worse yesterday EXAM: CT HEAD WITHOUT CONTRAST TECHNIQUE: Contiguous axial images were obtained from the base of the skull through the vertex without intravenous contrast. COMPARISON:  Brain MRI 03/05/2015 FINDINGS: Brain: No evidence of acute intracranial hemorrhage. No demarcated cortical infarction. No midline shift or extra-axial fluid collection. 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 Mild scattered T2/FLAIR hyperintensity within the cerebral white matter is nonspecific, but consistent with chronic small vessel ischemic disease. Redemonstrated prominent perivascular space within the inferolateral left basal ganglia (series 2, image 14). Mild generalized parenchymal atrophy. Vascular: No hyperdense vessel. Skull: Normal. Negative for fracture or focal lesion. Sinuses/Orbits: Visualized orbits demonstrate no acute abnormality. Postsurgical appearance of the paranasal sinuses. Minimal scattered paranasal sinus mucosal thickening with redemonstrated hypoplastic right maxillary sinus. No significant mastoid effusion. IMPRESSION: 1. No evidence of acute intracranial abnormality. 2. 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 3. Mild generalized parenchymal atrophy and chronic small vessel ischemic disease. Electronically Signed   By: Kellie Simmering DO   On: 07/25/2019 12:19   MR BRAIN WO CONTRAST  Result Date: 07/25/2019 CLINICAL DATA:  New onset of posterior head numbness on the right, mild dizziness, and blurred vision since noon yesterday. EXAM: MRI HEAD WITHOUT CONTRAST TECHNIQUE: Multiplanar, multiecho pulse sequences of the brain and surrounding structures were obtained  without intravenous contrast. COMPARISON:  CT head without contrast 07/25/2019. MR head without and with contrast 03/05/2015. FINDINGS: Brain: This diffusion-weighted images demonstrate acute left lateral pontine infarct measuring just over 2 cm anterior-posterior. Height is approximately 10 mm. No other acute infarcts are present. There is no associated hemorrhage. Moderate atrophy and diffuse white matter disease is present. Progressive subcortical white matter disease is present bilaterally. The ventricles are proportionate to the degree of atrophy. Remote lacunar infarcts are present in the posterior right lentiform nucleus. Dilated perivascular spaces are present in the basal ganglia. Remote nonhemorrhagic lacunar infarcts are present in the thalami bilaterally. T2 signal changes are associated with the acute infarct. More inferior central and posterolateral remote lacunar infarcts are present within the lower pons. The cerebellum is within normal limits. Vascular: Abnormal signal is present in the non dominant left vertebral artery, suggesting thrombus. The right vertebral artery and basilar artery are patent. Fetal type posterior cerebral arteries are present bilaterally. Flow is present within the anterior circulation. Skull and upper cervical spine: The craniocervical junction is normal. Upper cervical spine  is within normal limits. Marrow signal is unremarkable. Sinuses/Orbits: Patient is status post bilateral maxillary antrostomies and partial ethmoidectomies. Mild residual mucosal thickening is present within the ethmoid air cells. The right maxillary sinus is shrunken. The mastoid air cells are clear. IMPRESSION: 1. 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 2. Abnormal signal in the non dominant left vertebral artery, suggesting thrombus. 3. Progressive atrophy and diffuse white matter disease likely reflects the sequela of chronic microvascular ischemia. 4. Remote lacunar infarcts of the  right lentiform nucleus and bilateral thalami. Electronically Signed   By: San Morelle M.D.   On: 07/25/2019 13:55    EKG:    IMPRESSION AND PLAN:  Warren Ingram  is a 79 y.o. male with a known history of prior stroke, hypertension, hyperlipidemia, Jerrye Bushy, arthritis comes to the emergency room after he began to notice he was off balance with his gait. He also said noticed some change in vision and his right eye and numbness on his right side of his head.  1. Acute left pons infarct appears thrombotic -admit to medical floor -aspirin 81 mg + Plavix 75 mg-- neuro- recommendation -CTA of head and neck -MRI showed acute left pontine infarct -continue statins -echo with bubble study -PT, OT -patient passed swallow study in the ER  2. CAD status post CABG times four -continue aspirin and statins -stable no chest pain -follows with Dr. Ubaldo Glassing  3. HL -continue statins -check lipid profile  4. Recent right knee surgery  5. PT prophylaxis subcu Lovenox    Family Communication : patient Consults : neurology Code Status : DNR-- this was discussed with patient in the ER DVT prophylaxis : Lovenox  TOTAL TIME TAKING CARE OF THIS PATIENT: *50* minutes.    Fritzi Mandes M.D  [facility]     CC: Primary care physician; Idelle Crouch, MD

## 2019-07-25 NOTE — Plan of Care (Signed)
  Problem: Education: Goal: Knowledge of disease or condition will improve Outcome: Progressing Goal: Knowledge of secondary prevention will improve Outcome: Progressing   Problem: Nutrition: Goal: Risk of aspiration will decrease Outcome: Progressing   Problem: Ischemic Stroke/TIA Tissue Perfusion: Goal: Complications of ischemic stroke/TIA will be minimized Outcome: Progressing   Problem: Education: Goal: Knowledge of General Education information will improve Description: Including pain rating scale, medication(s)/side effects and non-pharmacologic comfort measures Outcome: Progressing   Problem: Safety: Goal: Ability to remain free from injury will improve Outcome: Progressing

## 2019-07-25 NOTE — ED Provider Notes (Signed)
[facility]    CSN: [id number] Arrival date & time: 07/25/19  [id number]      History   Chief Complaint Chief Complaint  Patient presents with  . Numbness    back of head  . Dizziness    HPI Warren Ingram is a 79 y.o. male.   79 yo male with a h/o CAD, h/o CVA (approx 8 years ago), s/p partial right knee replacement last month, presents with a c/o numbness to the back on his head on the right side, mild dizziness and vision blurriness since yesterday around noon. Denies any extremity numbness/weakness, severe headaches, slurred speech or swallowing problems, chest pains or shortness of breath. States today he has needed to use a walker due to unsteadiness of gait.    Dizziness   Past Medical History:  Diagnosis Date  . Arthritis   . Barrett esophagus   . Benign neoplasm of abdomen   . Benign neoplasm of colon   . Cancer ([place])    skin cancer  . Coronary artery disease   . GERD (gastroesophageal reflux disease)   . Heart murmur    ASYMPTOMATIC  . History of hiatal hernia   . Hypercholesterolemia   . Hypertension   . Hypothyroidism   . Stroke [facility]) 2015   mini stroke    Patient Active Problem List   Diagnosis Date Noted  . 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 Hematochezia 12/30/2017  . Hypothyroidism 12/30/2017  . 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 Benign neoplasm of colon 08/21/2013  . CAD in native artery 08/21/2013  . Diverticulitis of colon 08/21/2013  . GERD (gastroesophageal reflux disease) 08/21/2013  . Benign essential HTN 08/21/2013  . Anal bleeding 08/21/2013  . Arthritis, degenerative 08/21/2013  . 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 Pure hypercholesterolemia 08/21/2013  . Adenomatous colon polyp 08/21/2013  . Osteoarthrosis, unspecified whether generalized or localized, pelvic region and thigh 08/21/2013    Past  Surgical History:  Procedure Laterality Date  . CARDIAC CATHETERIZATION    . COLONOSCOPY WITH PROPOFOL N/A 01/01/2018   Procedure: COLONOSCOPY WITH PROPOFOL;  Surgeon: Jonathon Bellows, MD;  Location: [facility] ENDOSCOPY;  Service: Gastroenterology;  Laterality: N/A;  . CORONARY ARTERY BYPASS GRAFT  08/2017   4 vessels  . ESOPHAGOGASTRODUODENOSCOPY (EGD) WITH PROPOFOL N/A 10/28/2015   Procedure: ESOPHAGOGASTRODUODENOSCOPY (EGD) WITH PROPOFOL;  Surgeon: Hulen Luster, MD;  Location[facility] ENDOSCOPY;  Service: Gastroenterology;  Laterality: N/A;  . KNEE ARTHROSCOPY    . LEFT HEART CATH AND CORONARY ANGIOGRAPHY N/A 07/25/2017   Procedure: LEFT HEART CATH AND CORONARY ANGIOGRAPHY;  Surgeon: Teodoro Spray, MD;  Location: [place] CV LAB;  Service: Cardiovascular;  Laterality: N/A;  . NASAL SINUS SURGERY    . PARTIAL KNEE ARTHROPLASTY Right 06/27/2019   Procedure: UNICOMPARTMENTAL KNEE;  Surgeon: Corky Mull, MD;  Location: [facility] ORS;  Service: Orthopedics;  Laterality: Right;  . TOTAL KNEE ARTHROPLASTY Left 12/19/2018   Procedure: TOTAL KNEE ARTHROPLASTY;  Surgeon: Corky Mull, MD;  Location: [facility] ORS;  Service: Orthopedics;  Laterality: Left;  Marland Kitchen VASECTOMY         Home Medications    Prior to Admission medications   Medication Sig Start Date End Date Taking? Authorizing Provider  aspirin (ASPIRIN EC) 81 MG EC tablet Take 325 mg by mouth daily.    Yes [provider]  atorvastatin (LIPITOR) 80  MG tablet Take 80 mg by mouth at bedtime.    Yes [provider]  fluticasone (FLONASE) 50 MCG/ACT nasal spray Place 1 spray into both nostrils as needed.    Yes [provider]  levothyroxine (SYNTHROID, LEVOTHROID) 150 MCG tablet Take 150 mcg by mouth daily before breakfast.   Yes [provider]  metoprolol tartrate (LOPRESSOR) 25 MG tablet Take 25 mg by mouth 2 (two) times daily.  09/03/17  Yes [provider]  Omega-3 Fatty Acids (FISH OIL) 1200 MG CAPS Take 1,200  mg by mouth 2 (two) times a day.    Yes [provider]  omeprazole (PRILOSEC) 20 MG capsule Take 20 mg by mouth every morning.    Yes [provider]  tamsulosin (FLOMAX) 0.4 MG CAPS capsule Take 2 capsules (0.8 mg total) by mouth at bedtime. Patient taking differently: Take 0.4 mg by mouth at bedtime.  12/30/18  Yes MacDiarmid, Nicki Reaper, MD  telmisartan (MICARDIS) 40 MG tablet Take 40 mg by mouth every morning.    Yes [provider]  apixaban (ELIQUIS) 2.5 MG TABS tablet Take 1 tablet (2.5 mg total) by mouth 2 (two) times daily. 06/27/19   Poggi, Marshall Cork, MD  traMADol (ULTRAM) 50 MG tablet Take 1 tablet (50 mg total) by mouth every 6 (six) hours as needed for moderate pain or severe pain. 06/27/19   Poggi, Marshall Cork, MD    Family History Family History  Problem Relation Age of Onset  . Heart attack Mother   . Lung disease Father     Social History Social History   Tobacco Use  . Smoking status: Former Smoker    Packs/day: 1.00    Years: 15.00    Pack years: 15.00    Quit date: 06/06/1971    Years since quitting: 48.1  . Smokeless tobacco: Former Network engineer Use Topics  . Alcohol use: Yes    Alcohol/week: 1.0 standard drinks    Types: 1 Cans of beer per week    Comment: 4 to 5 times per week  . Drug use: No     Allergies   Patient has no known allergies.   Review of Systems Review of Systems  Neurological: Positive for dizziness.     Physical Exam Triage Vital Signs ED Triage Vitals  Enc Vitals Group     BP 07/25/19 0958 (!) 163/82     Pulse Rate 07/25/19 0958 63     Resp 07/25/19 0958 16     Temp 07/25/19 0958 98.1 F (36.7 C)     Temp Source 07/25/19 0958 Oral     SpO2 07/25/19 0958 100 %     Weight 07/25/19 0954 179 lb (81.2 kg)     Height 07/25/19 0954 5\' 5"  (1.651 m)     Head Circumference --      Peak Flow --      Pain Score 07/25/19 0954 0     Pain Loc --      Pain Edu? --      Excl. in [place]? --    No data found.  Updated  Vital Signs BP (!) 163/82 (BP Location: Right Arm)   Pulse 63   Temp 98.1 F (36.7 C) (Oral)   Resp 16   Ht 5\' 5"  (1.651 m)   Wt 81.2 kg   SpO2 100%   BMI 29.79 kg/m   Visual Acuity Right Eye Distance:   Left Eye Distance:   Bilateral Distance:  Right Eye Near:   Left Eye Near:    Bilateral Near:     Physical Exam Vitals and nursing note reviewed.  Constitutional:      General: He is not in acute distress.    Appearance: He is not toxic-appearing or diaphoretic.  Eyes:     Extraocular Movements: Extraocular movements intact.     Pupils: Pupils are equal, round, and reactive to light.  Cardiovascular:     Rate and Rhythm: Normal rate.     Heart sounds: Normal heart sounds.  Pulmonary:     Effort: Pulmonary effort is normal. No respiratory distress.     Breath sounds: Normal breath sounds.  Neurological:     Mental Status: He is alert and oriented to person, place, and time.     Cranial Nerves: No cranial nerve deficit.     Gait: Gait abnormal.     Deep Tendon Reflexes: Reflexes normal.      UC Treatments / Results  Labs (all labs ordered are listed, but only abnormal results are displayed) Labs Reviewed - No data to display  EKG   Radiology No results found.  Procedures Procedures (including critical care time)  Medications Ordered in UC Medications - No data to display  Initial Impression / Assessment and Plan / UC Course  I have reviewed the triage vital signs and the nursing notes.  Pertinent labs & imaging results that were available during my care of the patient were reviewed by me and considered in my medical decision making (see chart for details).      Final Clinical Impressions(s) / UC Diagnoses   Final diagnoses:  Numbness  Dizziness  Visual disturbance     Discharge Instructions     Recommend patient go to Emergency Department for further evaluation and management    ED Prescriptions    None      1. Discussed  possible etiologies and diagnoses; recommend patient go to Emergency Department for further evaluation and management. Patient verbalizes understanding, in stable condition, declines transport by EMS and states wife will drive him to ER. Report called to ED triage RN at [facility].   PDMP not reviewed this encounter.   Norval Gable, MD 07/25/19 [phone]

## 2019-07-25 NOTE — ED Triage Notes (Addendum)
Patient states that he had an episode of dizziness and numbness at the back of his head on the right side yesterday around 12:00pm.  Patient reports fizzy vision in his right eye for couple of days. Patient states that he was off balance when walking yesterday. Patient is walking with his walker today. Patient denies dizziness at this time.  Patient reports some numbness at the back of his head.  Patient denies any pain.  Patient denies chest pain or SOB.  Patient had partial right knee replacement 06/27/19.  Patient reports history of stroke 8-9 years ago.

## 2019-07-25 NOTE — ED Notes (Signed)
Patient transported to CT 

## 2019-07-25 NOTE — Consult Note (Signed)
Requesting Physician: Ellender Hose    Chief Complaint: Gait instability  I have been asked by Dr. Ellender Hose to see this patient in consultation for acute infarct.  HPI: Warren Ingram is an 79 y.o. male with a history of prior stroke, HTN and HLD who reports that on yesterday he began to notice that he was off balance with gait. Also noted some change in his vision and numbness on the right side of his head.  Decided to wait to see if his symptoms resolved.  When still present today the patient presented for evaluation.  Patient on ASA.  Initial NIHSS of 0.  Date last known well: 07/24/2019 Time last known well: Time: 12:00 tPA Given: No: Outside time window  Past Medical History:  Diagnosis Date  . Arthritis   . Barrett esophagus   . Benign neoplasm of abdomen   . Benign neoplasm of colon   . Cancer ([place])    skin cancer  . Coronary artery disease   . GERD (gastroesophageal reflux disease)   . Heart murmur    ASYMPTOMATIC  . History of hiatal hernia   . Hypercholesterolemia   . Hypertension   . Hypothyroidism   . Stroke [facility]) 2015   mini stroke    Past Surgical History:  Procedure Laterality Date  . CARDIAC CATHETERIZATION    . COLONOSCOPY WITH PROPOFOL N/A 01/01/2018   Procedure: COLONOSCOPY WITH PROPOFOL;  Surgeon: Jonathon Bellows, MD;  Location: [facility] ENDOSCOPY;  Service: Gastroenterology;  Laterality: N/A;  . CORONARY ARTERY BYPASS GRAFT  08/2017   4 vessels  . ESOPHAGOGASTRODUODENOSCOPY (EGD) WITH PROPOFOL N/A 10/28/2015   Procedure: ESOPHAGOGASTRODUODENOSCOPY (EGD) WITH PROPOFOL;  Surgeon: Hulen Luster, MD;  Location: [facility] ENDOSCOPY;  Service: Gastroenterology;  Laterality: N/A;  . KNEE ARTHROSCOPY    . LEFT HEART CATH AND CORONARY ANGIOGRAPHY N/A 07/25/2017   Procedure: LEFT HEART CATH AND CORONARY ANGIOGRAPHY;  Surgeon: Teodoro Spray, MD;  Location: [place] CV LAB;  Service: Cardiovascular;  Laterality: N/A;  . NASAL SINUS SURGERY    . PARTIAL KNEE ARTHROPLASTY Right  06/27/2019   Procedure: UNICOMPARTMENTAL KNEE;  Surgeon: Corky Mull, MD;  Location: [facility] ORS;  Service: Orthopedics;  Laterality: Right;  . TOTAL KNEE ARTHROPLASTY Left 12/19/2018   Procedure: TOTAL KNEE ARTHROPLASTY;  Surgeon: Corky Mull, MD;  Location: [facility] ORS;  Service: Orthopedics;  Laterality: Left;  Marland Kitchen VASECTOMY      Family History  Problem Relation Age of Onset  . Heart attack Mother   . Lung disease Father    Social History:  reports that he quit smoking about 48 years ago. He has a 15.00 pack-year smoking history. He has quit using smokeless tobacco. He reports current alcohol use of about 1.0 standard drinks of alcohol per week. He reports that he does not use drugs.  Allergies: No Known Allergies  Medications: I have reviewed the patient's current medications. Prior to Admission:  Prior to Admission medications   Medication Sig Start Date End Date Taking? Authorizing Provider  aspirin EC 325 MG tablet Take 325 mg by mouth daily.   Yes [provider]  atorvastatin (LIPITOR) 80 MG tablet Take 80 mg by mouth at bedtime.    Yes [provider]  levothyroxine (SYNTHROID, LEVOTHROID) 150 MCG tablet Take 150 mcg by mouth daily before breakfast.   Yes [provider]  metoprolol tartrate (LOPRESSOR) 25 MG tablet Take 25 mg by mouth 2 (two) times daily.  09/03/17  Yes [provider]  Omega-3 Fatty Acids (FISH OIL) 1200 MG CAPS Take 1,200 mg by mouth 2 (two) times a day.    Yes [provider]  omeprazole (PRILOSEC) 20 MG capsule Take 20 mg by mouth every morning.    Yes [provider]  tamsulosin (FLOMAX) 0.4 MG CAPS capsule Take 2 capsules (0.8 mg total) by mouth at bedtime. Patient taking differently: Take 0.4 mg by mouth at bedtime.  12/30/18  Yes MacDiarmid, Nicki Reaper, MD  telmisartan (MICARDIS) 40 MG tablet Take 40 mg by mouth every morning.    Yes [provider]  apixaban (ELIQUIS) 2.5 MG TABS tablet Take 1 tablet  (2.5 mg total) by mouth 2 (two) times daily. Patient not taking: Reported on 07/25/2019 06/27/19   Poggi, Marshall Cork, MD  aspirin (ASPIRIN EC) 81 MG EC tablet Take 325 mg by mouth daily.     [provider]  fluticasone (FLONASE) 50 MCG/ACT nasal spray Place 1 spray into both nostrils as needed.     [provider]  traMADol (ULTRAM) 50 MG tablet Take 1 tablet (50 mg total) by mouth every 6 (six) hours as needed for moderate pain or severe pain. 06/27/19   Poggi, Marshall Cork, MD    ROS: History obtained from the patient  General ROS: negative for - chills, fatigue, fever, night sweats, weight gain or weight loss Psychological ROS: negative for - behavioral disorder, hallucinations, memory difficulties, mood swings or suicidal ideation Ophthalmic ROS: right eye vision issues ENT ROS: negative for - epistaxis, nasal discharge, oral lesions, sore throat, tinnitus or vertigo Allergy and Immunology ROS: negative for - hives or itchy/watery eyes Hematological and Lymphatic ROS: negative for - bleeding problems, bruising or swollen lymph nodes Endocrine ROS: negative for - galactorrhea, hair pattern changes, polydipsia/polyuria or temperature intolerance Respiratory ROS: negative for - cough, hemoptysis, shortness of breath or wheezing Cardiovascular ROS: negative for - chest pain, dyspnea on exertion, edema or irregular heartbeat Gastrointestinal ROS: negative for - abdominal pain, diarrhea, hematemesis, nausea/vomiting or stool incontinence Genito-Urinary ROS: negative for - dysuria, hematuria, incontinence or urinary frequency/urgency Musculoskeletal ROS: negative for - joint swelling or muscular weakness Neurological ROS: as noted in HPI Dermatological ROS: negative for rash and skin lesion changes  Physical Examination: Blood pressure (!) 203/83, pulse (!) 57, temperature 98 F (36.7 C), temperature source Oral, resp. rate 16, height 5\' 4"  (1.626 m), weight 81.2 kg, SpO2 100  %.  HEENT-  Normocephalic, no lesions, without obvious abnormality.  Normal external eye and conjunctiva.  Normal TM's bilaterally.  Normal auditory canals and external ears. Normal external nose, mucus membranes and septum.  Normal pharynx. Cardiovascular- S1, S2 normal, pulses palpable throughout   Lungs- chest clear, no wheezing, rales, normal symmetric air entry Abdomen- soft, non-tender; bowel sounds normal; no masses,  no organomegaly Extremities- no edema Lymph-no adenopathy palpable Musculoskeletal-no joint tenderness, deformity or swelling Skin-warm and dry, no hyperpigmentation, vitiligo, or suspicious lesions  Neurological Examination   Mental Status: Alert, oriented, thought content appropriate.  Speech fluent without evidence of aphasia.  Able to follow 3 step commands without difficulty. Cranial Nerves: II: Discs flat bilaterally; Right eye superior quadrantanopia.  Ppupils equal, round, reactive to light and accommodation III,IV, VI: ptosis not present, extra-ocular motions intact bilaterally V,VII: smile symmetric, decreased sensation on the right ear and posterior right skull VIII: hearing normal bilaterally IX,X: gag reflex present XI: bilateral shoulder shrug XII: midline tongue extension Motor: Right : Upper extremity   5-/5 with RUE drift  Left:     Upper extremity   5/5  Lower extremity   5/5                 Lower extremity   5/5 Tone and bulk:normal tone throughout; no atrophy noted Sensory: Pinprick and light touch intact throughout, bilaterally Deep Tendon Reflexes: Symmetric throughout Plantars: Right: mute   Left: mute Cerebellar: Normal finger-to-nose and normal heel-to-shin testing bilaterally Gait: not tested due to safety concerns   Laboratory Studies:  Basic Metabolic Panel: Recent Labs  Lab 07/25/19 1117  NA 139  K 4.1  CL 99  CO2 26  GLUCOSE 106*  BUN 13  CREATININE 0.79  CALCIUM 9.5    Liver Function Tests: Recent Labs  Lab  07/25/19 1117  AST 24  ALT 30  ALKPHOS 140*  BILITOT 0.6  PROT 8.1  ALBUMIN 4.2   No results for input(s): LIPASE, AMYLASE in the last 168 hours. No results for input(s): AMMONIA in the last 168 hours.  CBC: Recent Labs  Lab 07/25/19 1117  WBC 7.3  NEUTROABS 5.2  HGB 14.1  HCT 43.2  MCV 85.2  PLT 306    Cardiac Enzymes: No results for input(s): CKTOTAL, CKMB, CKMBINDEX, TROPONINI in the last 168 hours.  BNP: Invalid input(s): POCBNP  CBG: No results for input(s): GLUCAP in the last 168 hours.  Microbiology: Results for orders placed or performed during the hospital encounter of 06/25/19  Surgical pcr screen     Status: None   Collection Time: 06/25/19  9:07 AM   Specimen: Nasal Mucosa; Nasal Swab  Result Value Ref Range Status   MRSA, PCR NEGATIVE NEGATIVE Final   Staphylococcus aureus NEGATIVE NEGATIVE Final    Comment: (NOTE) The Xpert SA Assay (FDA approved for NASAL specimens in patients 51 years of age and older), is one component of a comprehensive surveillance program. It is not intended to diagnose infection nor to guide or monitor treatment. Performed at [facility], [place], [place] [address]   SARS CORONAVIRUS 2 (TAT 6-24 HRS) Nasopharyngeal Nasopharyngeal Swab     Status: None   Collection Time: 06/25/19  9:59 AM   Specimen: Nasopharyngeal Swab  Result Value Ref Range Status   SARS Coronavirus 2 NEGATIVE NEGATIVE Final    Comment: (NOTE) SARS-CoV-2 target nucleic acids are NOT DETECTED. The SARS-CoV-2 RNA is generally detectable in upper and lower respiratory specimens during the acute phase of infection. Negative results do not preclude SARS-CoV-2 infection, do not rule out co-infections with other pathogens, and should not be used as the sole basis for treatment or other patient management decisions. Negative results must be combined with clinical observations, patient history, and epidemiological information. The  expected result is Negative. Fact Sheet for Patients: SugarRoll.be Fact Sheet for Healthcare Providers: https://www.woods-mathews.com/ This test is not yet approved or cleared by the [place] FDA and  has been authorized for detection and/or diagnosis of SARS-CoV-2 by FDA under an Emergency Use Authorization (EUA). This EUA will remain  in effect (meaning this test can be used) for the duration of the COVID-19 declaration under Section 56 4(b)(1) of the Act, 21 U.S.C. section 360bbb-3(b)(1), unless the authorization is terminated or revoked sooner. Performed at [facility], [place] [address], [place], [place] [address]     Coagulation Studies: Recent Labs    07/25/19 1117  LABPROT 12.6  INR 1.0    Urinalysis: No results for input(s): COLORURINE, LABSPEC, [place], GLUCOSEU, HGBUR, BILIRUBINUR, KETONESUR,  PROTEINUR, UROBILINOGEN, NITRITE, LEUKOCYTESUR in the last 168 hours.  Invalid input(s): APPERANCEUR  Lipid Panel: No results found for: CHOL, TRIG, HDL, CHOLHDL, VLDL, LDLCALC  HgbA1C: No results found for: HGBA1C  Urine Drug Screen:  No results found for: LABOPIA, COCAINSCRNUR, LABBENZ, AMPHETMU, THCU, LABBARB  Alcohol Level: No results for input(s): ETH in the last 168 hours.  Other results: EKG: normal sinus rhythm at 60 bpm.  Imaging: CT HEAD WO CONTRAST  Result Date: 07/25/2019 CLINICAL DATA:  Vision loss, monocular, possible stroke. Additional history provided by technologist: Patient reports trouble walking, numbness to right ear and back of head that started around 2 p.m. 07/24/2019, stroke in the past with resultant right eye vision changes with vision somewhat worse yesterday EXAM: CT HEAD WITHOUT CONTRAST TECHNIQUE: Contiguous axial images were obtained from the base of the skull through the vertex without intravenous contrast. COMPARISON:  Brain MRI 03/05/2015 FINDINGS: Brain: No evidence of acute intracranial  hemorrhage. No demarcated cortical infarction. No midline shift or extra-axial fluid collection. 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 Mild scattered T2/FLAIR hyperintensity within the cerebral white matter is nonspecific, but consistent with chronic small vessel ischemic disease. Redemonstrated prominent perivascular space within the inferolateral left basal ganglia (series 2, image 14). Mild generalized parenchymal atrophy. Vascular: No hyperdense vessel. Skull: Normal. Negative for fracture or focal lesion. Sinuses/Orbits: Visualized orbits demonstrate no acute abnormality. Postsurgical appearance of the paranasal sinuses. Minimal scattered paranasal sinus mucosal thickening with redemonstrated hypoplastic right maxillary sinus. No significant mastoid effusion. IMPRESSION: 1. No evidence of acute intracranial abnormality. 2. 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 3. Mild generalized parenchymal atrophy and chronic small vessel ischemic disease. Electronically Signed   By: Kellie Simmering DO   On: 07/25/2019 12:19   MR BRAIN WO CONTRAST  Result Date: 07/25/2019 CLINICAL DATA:  New onset of posterior head numbness on the right, mild dizziness, and blurred vision since noon yesterday. EXAM: MRI HEAD WITHOUT CONTRAST TECHNIQUE: Multiplanar, multiecho pulse sequences of the brain and surrounding structures were obtained without intravenous contrast. COMPARISON:  CT head without contrast 07/25/2019. MR head without and with contrast 03/05/2015. FINDINGS: Brain: This diffusion-weighted images demonstrate acute left lateral pontine infarct measuring just over 2 cm anterior-posterior. Height is approximately 10 mm. No other acute infarcts are present. There is no associated hemorrhage. Moderate atrophy and diffuse white matter disease is present. Progressive subcortical white matter  disease is present bilaterally. The ventricles are proportionate to the degree of atrophy. Remote lacunar infarcts are present in the posterior right lentiform nucleus. Dilated perivascular spaces are present in the basal ganglia. Remote nonhemorrhagic lacunar infarcts are present in the thalami bilaterally. T2 signal changes are associated with the acute infarct. More inferior central and posterolateral remote lacunar infarcts are present within the lower pons. The cerebellum is within normal limits. Vascular: Abnormal signal is present in the non dominant left vertebral artery, suggesting thrombus. The right vertebral artery and basilar artery are patent. Fetal type posterior cerebral arteries are present bilaterally. Flow is present within the anterior circulation. Skull and upper cervical spine: The craniocervical junction is normal. Upper cervical spine is within normal limits. Marrow signal is unremarkable. Sinuses/Orbits: Patient is status post bilateral maxillary antrostomies and partial ethmoidectomies. Mild residual mucosal thickening is present within the ethmoid air cells. The right maxillary sinus is shrunken. The mastoid air cells are clear. IMPRESSION: 1. 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 2. Abnormal signal in the non dominant left vertebral artery, suggesting thrombus. 3. Progressive atrophy and diffuse white matter disease likely reflects the sequela of chronic microvascular ischemia. 4. Remote lacunar infarcts of the right lentiform nucleus and bilateral thalami. Electronically Signed   By: San Morelle M.D.   On: 07/25/2019 13:55    Assessment: 79 y.o. male with a history of prior stroke, HTN and HLD who reports that on yesterday he began to notice that he was off balance with gait. Also noted some change in his vision and numbness on the right side of his head.  Decided to wait to see if his symptoms resolved.  When still present today the patient presented for  evaluation.  Patient on ASA.  MRI of the brain personally reviewed and shows an acute left lateral pontine infarct.  There is suggestion of thrombus in the left vertebral.  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  Further work yp recommended.    Stroke Risk Factors - hyperlipidemia and hypertension  Plan: 1. HgbA1c, fasting lipid panel 2. CTA of the head and neck 3. PT consult, OT consult, Speech consult 4. Echocardiogram with bubble study 5. Prophylactic therapy-Dual antiplatelet therapy with ASA 81mg  and Plavix 75mg  for three weeks with change to Plavix 75mg  daily alone as monotherapy after that time. 6. NPO until RN stroke swallow screen 7. Telemetry monitoring.  If unremarkable during this hospitalization would refer have patient follow up with cardiology on an outpatient basis for evaluation for prolonged cardiac monitoring  8. Frequent neuro checks  Alexis Goodell, MD Neurology [phone] 07/25/2019, 2:35 PM

## 2019-07-25 NOTE — ED Notes (Signed)
Pt is in CT and will return to room 13 after scan

## 2019-07-25 NOTE — Discharge Instructions (Signed)
Recommend patient go to Emergency Department for further evaluation and management °

## 2019-07-26 ENCOUNTER — Inpatient Hospital Stay
Admit: 2019-07-26 | Discharge: 2019-07-26 | Disposition: A | Discharge status: Home / Self Care | Payer: Medicare PPO | Attending: Internal Medicine | Admitting: Internal Medicine

## 2019-07-26 DIAGNOSIS — I5189 Other ill-defined heart diseases: Secondary | ICD-10-CM

## 2019-07-26 DIAGNOSIS — I350 Nonrheumatic aortic (valve) stenosis: Secondary | ICD-10-CM

## 2019-07-26 HISTORY — DX: Other ill-defined heart diseases: I51.89

## 2019-07-26 HISTORY — DX: Nonrheumatic aortic (valve) stenosis: I35.0

## 2019-07-26 LAB — HEMOGLOBIN A1C
Hgb A1c MFr Bld: 6.5 % — ABNORMAL HIGH (ref 4.8–5.6)
Mean Plasma Glucose: 139.85 mg/dL

## 2019-07-26 LAB — SARS CORONAVIRUS 2 (TAT 6-24 HRS): SARS Coronavirus 2: NEGATIVE

## 2019-07-26 MED ORDER — CLOPIDOGREL BISULFATE 75 MG PO TABS: 75.0000 mg | ORAL_TABLET | Freq: Every day | ORAL | 0 refills | Status: DC

## 2019-07-26 NOTE — Evaluation (Signed)
Occupational Therapy Evaluation Patient Details Name: CALIJAH VANDERHOEF MRN: [id number] DOB: 1940/09/29 Today's Date: 07/26/2019    History of Present Illness Klaus Ellender  is a 79 y.o. male with a known history of prior stroke, hypertension, hyperlipidemia, Jerrye Bushy, arthritis comes to the emergency room after he began to notice he was off balance with his gait. He also said noticed some change in vision and his right eye and numbness on his right side of his head. Imaging revealed a L pons CVA.   Clinical Impression   Pt is 79 year old male who presents with new onset of L pons CVA (see above for presenting problems).  Pt lives at home with his wife and is active doing tasks around the house including heavy digging.  He is eager to return back home and presents at baseline for ADLs.  Pt is able to move BUE and hands for self care skills with very mild coordination deficits for rapid alternating movements only.  Pt seen for OT evaluation only and no further OT needs identified.          Follow Up Recommendations  No OT follow up    Equipment Recommendations       Recommendations for Other Services       Precautions / Restrictions Precautions Precautions: None Precaution Comments: Chart indicates he is a low fall risk. Restrictions Weight Bearing Restrictions: No      Mobility Bed Mobility                  Transfers                      Balance                                           ADL either performed or assessed with clinical judgement   ADL Overall ADL's : At baseline                                       General ADL Comments: Pt is at baseline for ADLs and was able to ambulate with supervision only from recliner to bed in preparation for Echo bubble study.  He has hx of both needs being replaced and is able to complete LB dressing skills with forward flexion w/o any AD but needs extra time to complete.  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     Vision Baseline Vision/History: Wears glasses Wears Glasses: At all times Patient Visual Report: No change from baseline Additional Comments: he had decreased vision in R at admission but has resolved with no apparent visual deficits and back to baseline.     Perception     Praxis      Pertinent Vitals/Pain       Hand Dominance Right   Extremity/Trunk Assessment Upper Extremity Assessment Upper Extremity Assessment: Overall WFL for tasks assessed(Pt with hx of CVA with weakness and mild weakness noted in RUE but has full functional AROM for ADLs and intact sensation.)   Lower Extremity Assessment Lower Extremity Assessment: Defer to PT evaluation       Communication Communication Communication: No difficulties   Cognition Arousal/Alertness: Awake/alert Behavior  During Therapy: WFL for tasks assessed/performed Overall Cognitive Status: Within Functional Limits for tasks assessed                                     General Comments       Exercises     Shoulder Instructions      Home Living Family/patient expects to be discharged to:: Private residence Living Arrangements: Spouse/significant other Available Help at Discharge: Family;Available 24 hours/day Type of Home: House Home Access: Stairs to enter [employer] of Steps: 2 Entrance Stairs-Rails: Left;Right Home Layout: Able to live on main level with bedroom/bathroom     Bathroom Shower/Tub: Occupational psychologist: Handicapped height Bathroom Accessibility: Yes How Accessible: Accessible via walker Home Equipment: [place] - 2 wheels;Cane - single point;Grab bars - tub/shower;Toilet riser;Hand held shower head;Shower seat   Additional Comments: Pt states he has a shower chair but does not use it.      Prior Functioning/Environment Level of Independence:  Independent        Comments: Indep with ADLs, household and community mobilization without assist device; Pt with increasing knee pain recently, but able to drive and be out of the home;        OT Problem List: Decreased activity tolerance      OT Treatment/Interventions:      OT Goals(Current goals can be found in the care plan section) Acute Rehab OT Goals Patient Stated Goal: "to go home as soon as I can" OT Goal Formulation: With patient Time For Goal Achievement: 07/26/19 Potential to Achieve Goals: Good  OT Frequency:     Barriers to D/C:            Co-evaluation              AM-PAC OT "6 Clicks" Daily Activity     Outcome Measure Help from another person eating meals?: None Help from another person taking care of personal grooming?: None Help from another person toileting, which includes using toliet, bedpan, or urinal?: None Help from another person bathing (including washing, rinsing, drying)?: None Help from another person to put on and taking off regular upper body clothing?: None Help from another person to put on and taking off regular lower body clothing?: None 6 Click Score: 24   End of Session Equipment Utilized During Treatment: Gait belt  Activity Tolerance: Patient tolerated treatment well Patient left: in bed;Other (comment)(Echo tech in room for Bubble study)  OT Visit Diagnosis: Muscle weakness (generalized) (M62.81)                Time: [id number] OT Time Calculation (min): 32 min Charges:  OT General Charges $OT Visit: 1 Visit OT Evaluation $OT Eval Low Complexity: 1 Low OT Treatments $Self Care/Home Management : 8-22 mins  Chrys Racer, OTR/L, [place] ascom [phone] 07/26/19, 11:38 AM

## 2019-07-26 NOTE — Progress Notes (Signed)
SLP Cancellation Note  Patient Details Name: Warren Ingram MRN: [id number] DOB: 1941-01-08   Cancelled treatment:       Reason Eval/Treat Not Completed: SLP screened, no needs identified, will sign off; Per RN report, no overt cognitive/speech/language issues noted during conversation. Upon observation, patient able to verbally communicate all wants/needs at conversation level without apparent speech/articulation deficits. Patient also denied recent changes in memory/problem solving skills. He was oriented x3 and able to accurately recall med hx. Per MD report, potential d/c today or tomorrow. At this time, skilled ST services not indicated. Patient left with call light in reach and with RN present at bedside.   Loni Beckwith, M.S. CCC-SLP Speech-Language Pathologist  Loni Beckwith 07/26/2019, 9:18 AM

## 2019-07-26 NOTE — Progress Notes (Signed)
*  PRELIMINARY RESULTS* Echocardiogram 2D Echocardiogram has been performed. No Bubble Study was performed on this study, there was not a nurse available or willing to perform it, Dr. [facility] is aware.  Lavell Luster [place] 07/26/2019, 11:58 AM

## 2019-07-26 NOTE — Discharge Instructions (Signed)

## 2019-07-26 NOTE — Progress Notes (Signed)
MD order received in [facility] to discharge pt home today; verbally reviewed AVS with pt; pt discharged via wheelchair by nursing to the medical mall entrance

## 2019-07-26 NOTE — Plan of Care (Signed)
  Problem: Education: Goal: Knowledge of disease or condition will improve Outcome: Adequate for Discharge Goal: Knowledge of secondary prevention will improve Outcome: Adequate for Discharge   Problem: Nutrition: Goal: Risk of aspiration will decrease Outcome: Adequate for Discharge   Problem: Ischemic Stroke/TIA Tissue Perfusion: Goal: Complications of ischemic stroke/TIA will be minimized Outcome: Adequate for Discharge   Problem: Education: Goal: Knowledge of General Education information will improve Description: Including pain rating scale, medication(s)/side effects and non-pharmacologic comfort measures Outcome: Adequate for Discharge   Problem: Safety: Goal: Ability to remain free from injury will improve Outcome: Adequate for Discharge

## 2019-07-26 NOTE — Progress Notes (Addendum)
Subjective: No new neurological complaints.  Numbness improved.    Objective: Current vital signs: BP (!) 157/88 (BP Location: Right Arm)   Pulse 63   Temp 98 F (36.7 C)   Resp 18   Ht 5\' 4"  (1.626 m)   Wt 81.2 kg   SpO2 95%   BMI 30.73 kg/m  Vital signs in last 24 hours: Temp:  [97.6 F (36.4 C)-98.2 F (36.8 C)] 98 F (36.7 C) (02/20 0757) Pulse Rate:  [52-73] 63 (02/20 0757) Resp:  [13-20] 18 (02/20 0757) BP: (148-203)/(68-92) 157/88 (02/20 0757) SpO2:  [95 %-100 %] 95 % (02/20 0757) Weight:  [81.2 kg] 81.2 kg (02/19 1104)  Intake/Output from previous day: 02/19 0701 - 02/20 0700 In: 400 [P.O.:250; I.V.:150] Out: -  Intake/Output this shift: No intake/output data recorded. Nutritional status:  Diet Order            Diet Heart Room service appropriate? Yes; Fluid consistency: Thin  Diet effective now              Neurologic Exam: Mental Status: Alert, oriented, thought content appropriate.  Speech fluent without evidence of aphasia.  Able to follow 3 step commands without difficulty. Cranial Nerves: II: Right eye superior quadrantanopia.  Ppupils equal, round, reactive to light and accommodation III,IV, VI: ptosis not present, extra-ocular motions intact bilaterally V,VII: mild right facial droop, facial sensation intact VIII: hearing normal bilaterally IX,X: gag reflex present XI: bilateral shoulder shrug XII: midline tongue extension Motor: Right :  Upper extremity   5-/5 with RUE drift                          Left:     Upper extremity   5/5             Lower extremity   5/5                                                              Lower extremity   5/5 Tone and bulk:normal tone throughout; no atrophy noted Sensory: Pinprick and light touch intact throughout, bilaterally  Lab Results: Basic Metabolic Panel: Recent Labs  Lab 07/25/19 1117  NA 139  K 4.1  CL 99  CO2 26  GLUCOSE 106*  BUN 13  CREATININE 0.79  CALCIUM 9.5    Liver  Function Tests: Recent Labs  Lab 07/25/19 1117  AST 24  ALT 30  ALKPHOS 140*  BILITOT 0.6  PROT 8.1  ALBUMIN 4.2   No results for input(s): LIPASE, AMYLASE in the last 168 hours. No results for input(s): AMMONIA in the last 168 hours.  CBC: Recent Labs  Lab 07/25/19 1117  WBC 7.3  NEUTROABS 5.2  HGB 14.1  HCT 43.2  MCV 85.2  PLT 306    Cardiac Enzymes: No results for input(s): CKTOTAL, CKMB, CKMBINDEX, TROPONINI in the last 168 hours.  Lipid Panel: Recent Labs  Lab 07/25/19 1117  CHOL 207*  TRIG 333*  HDL 32*  CHOLHDL 6.5  VLDL 67*  LDLCALC 108*    CBG: No results for input(s): GLUCAP in the last 168 hours.  Microbiology: Results for orders placed or performed during the hospital encounter of 07/25/19  SARS CORONAVIRUS 2 (TAT 6-24 HRS) Nasopharyngeal Nasopharyngeal Swab  Status: None   Collection Time: 07/25/19  3:16 PM   Specimen: Nasopharyngeal Swab  Result Value Ref Range Status   SARS Coronavirus 2 NEGATIVE NEGATIVE Final    Comment: (NOTE) SARS-CoV-2 target nucleic acids are NOT DETECTED. The SARS-CoV-2 RNA is generally detectable in upper and lower respiratory specimens during the acute phase of infection. Negative results do not preclude SARS-CoV-2 infection, do not rule out co-infections with other pathogens, and should not be used as the sole basis for treatment or other patient management decisions. Negative results must be combined with clinical observations, patient history, and epidemiological information. The expected result is Negative. Fact Sheet for Patients: SugarRoll.be Fact Sheet for Healthcare Providers: https://www.woods-mathews.com/ This test is not yet approved or cleared by the [place] FDA and  has been authorized for detection and/or diagnosis of SARS-CoV-2 by FDA under an Emergency Use Authorization (EUA). This EUA will remain  in effect (meaning this test can be used) for  the duration of the COVID-19 declaration under Section 56 4(b)(1) of the Act, 21 U.S.C. section 360bbb-3(b)(1), unless the authorization is terminated or revoked sooner. Performed at [facility], [place] [address], [place], [place] [address]     Coagulation Studies: Recent Labs    07/25/19 1117  LABPROT 12.6  INR 1.0    Imaging: CT ANGIO HEAD W OR WO CONTRAST  Result Date: 07/25/2019 CLINICAL DATA:  Stroke, follow-up. EXAM: CT ANGIOGRAPHY HEAD AND NECK TECHNIQUE: Multidetector CT imaging of the head and neck was performed using the standard protocol during bolus administration of intravenous contrast. Multiplanar CT image reconstructions and MIPs were obtained to evaluate the vascular anatomy. Carotid stenosis measurements (when applicable) are obtained utilizing NASCET criteria, using the distal internal carotid diameter as the denominator. CONTRAST:  8mL OMNIPAQUE IOHEXOL 350 MG/ML SOLN COMPARISON:  Noncontrast head CT performed earlier the same day 07/25/2019, brain MRI performed earlier the same day 07/25/2019, MRI/MRA head 07/21/2013 FINDINGS: CT HEAD FINDINGS Brain: There is no evidence of acute intracranial hemorrhage. No demarcated cortical infarction. 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 Redemonstrated remote lacunar infarct within the right lentiform nucleus. Background mild ill-defined hypoattenuation within the cerebral white matter is nonspecific, but consistent with chronic small vessel ischemic disease. Mild generalized parenchymal atrophy. 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 Vascular: Reported separately. Skull: Normal. Negative for fracture or focal lesion. Sinuses: Postsurgical appearance of the paranasal sinuses with mild scattered paranasal sinus mucosal thickening. Tiny left maxillary sinus mucous retention cyst. Redemonstrated  hypoplastic right maxillary sinus. No significant mastoid effusion. Orbits: Visualized orbits demonstrate no acute abnormality. Review of the MIP images confirms the above findings CTA NECK FINDINGS Aortic arch: Common origin of the innominate and left common carotid arteries. Mixed plaque within the visualized aortic arch and proximal major branch vessels of the neck. No significant stenosis of the innominate or proximal right subclavian arteries. Somewhat prominent mixed plaque at the origin of the left subclavian artery results in estimated 50-60% stenosis. Right carotid system: CCA and ICA patent within the neck. Mixed plaque within the carotid bifurcation and proximal ICA. Estimated stenosis of the proximal ICA up to 70%. Left carotid system: CCA and ICA patent within the neck. Mixed plaque within the carotid bifurcation and proximal ICA. Estimated stenosis of the proximal ICA of 50-60%. Additionally, there is ulcerated plaque within the carotid bulb. Vertebral arteries: The right vertebral artery strongly  dominant and patent within the neck without stenosis. The left vertebral artery is markedly diminutive on a developmental basis. The left vertebral artery appears patent throughout the V1 and majority of the V2 segment. The very distal V2 and portions of the V3 segment are poorly delineated. Reconstitution of enhancement is seen within the distal V3 and V4 left vertebral artery. Skeleton: No acute bony abnormality. Cervical spondylosis with multilevel posterior disc osteophytes, uncovertebral and facet hypertrophy. Other neck: No neck mass or cervical lymphadenopathy. Upper chest: No consolidation within the imaged lung apices. Prior median sternotomy. Review of the MIP images confirms the above findings CTA HEAD FINDINGS Anterior circulation: The intracranial internal carotid arteries are patent with mild calcified plaque but no significant stenosis. The M1 middle cerebral arteries are patent bilaterally.  Mild atherosclerotic narrowing of the mid to distal M1 left MCA. No M2 proximal branch occlusion or high-grade proximal stenosis is identified. The anterior cerebral arteries are patent bilaterally without high-grade proximal stenosis. Redemonstrated hypoplastic A1 right ACA. No intracranial aneurysm is identified. Posterior circulation: The dominant intracranial right vertebral artery is patent without stenosis. As noted, the non dominant intracranial left vertebral artery is reconstituted, possibly via retrograde flow. Flow is seen within the proximal PICA vessels bilaterally. Flow is seen within AICA vessels bilaterally, although these vessels are diminutive. Flow is seen within the proximal superior cerebellar arteries bilaterally. Atherosclerotic irregularity of the basilar artery. Most notably, noncalcified plaque results in moderate to moderately advanced stenosis within the mid basilar artery (series 511, image 116). Predominantly fetal origin of the posterior cerebral arteries with hypoplastic P1 segments bilaterally. Moderate/severe stenosis at the origin of the right posterior communicating artery. Mild stenosis at the origin of the left posterior communicating artery. Significant atherosclerotic irregularity of the posterior cerebral arteries more distally. Most notably there are moderate/severe segmental stenoses within the proximal P2 posterior segments bilaterally (series 513, image 53) (series 513, image 52). Venous sinuses: Within limitations of contrast timing, no convincing thrombus. Anatomic variants: As described Review of the MIP images confirms the above findings IMPRESSION: CT head: 1. A 10 mm acute infarct within the left pons was better appreciated on same-day brain MRI. 2. Additional chronic findings without interval change from earlier head CT. CTA neck: 1. The common carotid and internal carotid arteries are patent within the neck. Mixed plaque within the carotid bifurcations and  proximal ICAs. Estimated stenosis of the proximal right ICA of up to 70%. Estimated stenosis of the proximal left ICA of 50-60%. 2. The right vertebral artery is strongly dominant and patent throughout the neck without stenosis. 3. The left vertebral artery is markedly diminutive on a developmental basis. The left vertebral artery is patent throughout the V1 and majority of V2 segments. Portions of the distal V2 and V3 segments are poorly delineated and may be highly stenosed or occluded. Reconstitution of the distal V3 and V4 segments, possibly via retrograde flow. 4. Mixed plaque results in stenosis of the origin of the left subclavian artery estimated at 50-60%. CTA head: 1. Reconstitution of the non dominant intracranial left vertebral artery, possibly via retrograde flow. Of note, flow is seen within the proximal left PICA. 2. 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 3. 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 4. Predominantly fetal origin of the bilateral posterior cerebral arteries. Moderate/severe stenosis at the origin of the right posterior communicating artery. Moderate/severe segmental stenoses within the proximal P2 posterior cerebral arteries bilaterally. 5. No anterior  circulation proximal high-grade stenosis. Electronically Signed   By: Kellie Simmering DO   On: 07/25/2019 17:26   CT HEAD WO CONTRAST  Result Date: 07/25/2019 CLINICAL DATA:  Vision loss, monocular, possible stroke. Additional history provided by technologist: Patient reports trouble walking, numbness to right ear and back of head that started around 2 p.m. 07/24/2019, stroke in the past with resultant right eye vision changes with vision somewhat worse yesterday EXAM: CT HEAD WITHOUT CONTRAST TECHNIQUE: Contiguous axial images were obtained from the base of the skull through the vertex without intravenous contrast. COMPARISON:  Brain MRI 03/05/2015  FINDINGS: Brain: No evidence of acute intracranial hemorrhage. No demarcated cortical infarction. No midline shift or extra-axial fluid collection. 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 Mild scattered T2/FLAIR hyperintensity within the cerebral white matter is nonspecific, but consistent with chronic small vessel ischemic disease. Redemonstrated prominent perivascular space within the inferolateral left basal ganglia (series 2, image 14). Mild generalized parenchymal atrophy. Vascular: No hyperdense vessel. Skull: Normal. Negative for fracture or focal lesion. Sinuses/Orbits: Visualized orbits demonstrate no acute abnormality. Postsurgical appearance of the paranasal sinuses. Minimal scattered paranasal sinus mucosal thickening with redemonstrated hypoplastic right maxillary sinus. No significant mastoid effusion. IMPRESSION: 1. No evidence of acute intracranial abnormality. 2. 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 3. Mild generalized parenchymal atrophy and chronic small vessel ischemic disease. Electronically Signed   By: Kellie Simmering DO   On: 07/25/2019 12:19   CT ANGIO NECK W OR WO CONTRAST  Result Date: 07/25/2019 CLINICAL DATA:  Stroke, follow-up. EXAM: CT ANGIOGRAPHY HEAD AND NECK TECHNIQUE: Multidetector CT imaging of the head and neck was performed using the standard protocol during bolus administration of intravenous contrast. Multiplanar CT image reconstructions and MIPs were obtained to evaluate the vascular anatomy. Carotid stenosis measurements (when applicable) are obtained utilizing NASCET criteria, using the distal internal carotid diameter as the denominator. CONTRAST:  25mL OMNIPAQUE IOHEXOL 350 MG/ML SOLN COMPARISON:  Noncontrast head CT performed earlier the same day 07/25/2019, brain MRI performed earlier the same day 07/25/2019, MRI/MRA head  07/21/2013 FINDINGS: CT HEAD FINDINGS Brain: There is no evidence of acute intracranial hemorrhage. No demarcated cortical infarction. 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 Redemonstrated remote lacunar infarct within the right lentiform nucleus. Background mild ill-defined hypoattenuation within the cerebral white matter is nonspecific, but consistent with chronic small vessel ischemic disease. Mild generalized parenchymal atrophy. 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 Vascular: Reported separately. Skull: Normal. Negative for fracture or focal lesion. Sinuses: Postsurgical appearance of the paranasal sinuses with mild scattered paranasal sinus mucosal thickening. Tiny left maxillary sinus mucous retention cyst. Redemonstrated hypoplastic right maxillary sinus. No significant mastoid effusion. Orbits: Visualized orbits demonstrate no acute abnormality. Review of the MIP images confirms the above findings CTA NECK FINDINGS Aortic arch: Common origin of the innominate and left common carotid arteries. Mixed plaque within the visualized aortic arch and proximal major branch vessels of the neck. No significant stenosis of the innominate or proximal right subclavian arteries. Somewhat prominent mixed plaque at the origin of the left subclavian artery results in estimated 50-60% stenosis. Right carotid system: CCA and ICA patent within the neck. Mixed plaque within the carotid bifurcation and proximal ICA. Estimated stenosis of the proximal ICA up to 70%. Left carotid  system: CCA and ICA patent within the neck. Mixed plaque within the carotid bifurcation and proximal ICA. Estimated stenosis of the proximal ICA of 50-60%. Additionally, there is ulcerated plaque within the carotid bulb. Vertebral arteries: The right vertebral artery strongly dominant and patent within the neck without  stenosis. The left vertebral artery is markedly diminutive on a developmental basis. The left vertebral artery appears patent throughout the V1 and majority of the V2 segment. The very distal V2 and portions of the V3 segment are poorly delineated. Reconstitution of enhancement is seen within the distal V3 and V4 left vertebral artery. Skeleton: No acute bony abnormality. Cervical spondylosis with multilevel posterior disc osteophytes, uncovertebral and facet hypertrophy. Other neck: No neck mass or cervical lymphadenopathy. Upper chest: No consolidation within the imaged lung apices. Prior median sternotomy. Review of the MIP images confirms the above findings CTA HEAD FINDINGS Anterior circulation: The intracranial internal carotid arteries are patent with mild calcified plaque but no significant stenosis. The M1 middle cerebral arteries are patent bilaterally. Mild atherosclerotic narrowing of the mid to distal M1 left MCA. No M2 proximal branch occlusion or high-grade proximal stenosis is identified. The anterior cerebral arteries are patent bilaterally without high-grade proximal stenosis. Redemonstrated hypoplastic A1 right ACA. No intracranial aneurysm is identified. Posterior circulation: The dominant intracranial right vertebral artery is patent without stenosis. As noted, the non dominant intracranial left vertebral artery is reconstituted, possibly via retrograde flow. Flow is seen within the proximal PICA vessels bilaterally. Flow is seen within AICA vessels bilaterally, although these vessels are diminutive. Flow is seen within the proximal superior cerebellar arteries bilaterally. Atherosclerotic irregularity of the basilar artery. Most notably, noncalcified plaque results in moderate to moderately advanced stenosis within the mid basilar artery (series 511, image 116). Predominantly fetal origin of the posterior cerebral arteries with hypoplastic P1 segments bilaterally. Moderate/severe stenosis at  the origin of the right posterior communicating artery. Mild stenosis at the origin of the left posterior communicating artery. Significant atherosclerotic irregularity of the posterior cerebral arteries more distally. Most notably there are moderate/severe segmental stenoses within the proximal P2 posterior segments bilaterally (series 513, image 53) (series 513, image 52). Venous sinuses: Within limitations of contrast timing, no convincing thrombus. Anatomic variants: As described Review of the MIP images confirms the above findings IMPRESSION: CT head: 1. A 10 mm acute infarct within the left pons was better appreciated on same-day brain MRI. 2. Additional chronic findings without interval change from earlier head CT. CTA neck: 1. The common carotid and internal carotid arteries are patent within the neck. Mixed plaque within the carotid bifurcations and proximal ICAs. Estimated stenosis of the proximal right ICA of up to 70%. Estimated stenosis of the proximal left ICA of 50-60%. 2. The right vertebral artery is strongly dominant and patent throughout the neck without stenosis. 3. The left vertebral artery is markedly diminutive on a developmental basis. The left vertebral artery is patent throughout the V1 and majority of V2 segments. Portions of the distal V2 and V3 segments are poorly delineated and may be highly stenosed or occluded. Reconstitution of the distal V3 and V4 segments, possibly via retrograde flow. 4. Mixed plaque results in stenosis of the origin of the left subclavian artery estimated at 50-60%. CTA head: 1. Reconstitution of the non dominant intracranial left vertebral artery, possibly via retrograde flow. Of note, flow is seen within the proximal left PICA. 2. 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 3. Moderate to moderately severe  stenosis within the mid basilar artery. 4. Predominantly fetal origin of the bilateral posterior cerebral  arteries. Moderate/severe stenosis at the origin of the right posterior communicating artery. Moderate/severe segmental stenoses within the proximal P2 posterior cerebral arteries bilaterally. 5. No anterior circulation proximal high-grade stenosis. Electronically Signed   By: Kellie Simmering DO   On: 07/25/2019 17:26   MR BRAIN WO CONTRAST  Result Date: 07/25/2019 CLINICAL DATA:  New onset of posterior head numbness on the right, mild dizziness, and blurred vision since noon yesterday. EXAM: MRI HEAD WITHOUT CONTRAST TECHNIQUE: Multiplanar, multiecho pulse sequences of the brain and surrounding structures were obtained without intravenous contrast. COMPARISON:  CT head without contrast 07/25/2019. MR head without and with contrast 03/05/2015. FINDINGS: Brain: This diffusion-weighted images demonstrate acute left lateral pontine infarct measuring just over 2 cm anterior-posterior. Height is approximately 10 mm. No other acute infarcts are present. There is no associated hemorrhage. Moderate atrophy and diffuse white matter disease is present. Progressive subcortical white matter disease is present bilaterally. The ventricles are proportionate to the degree of atrophy. Remote lacunar infarcts are present in the posterior right lentiform nucleus. Dilated perivascular spaces are present in the basal ganglia. Remote nonhemorrhagic lacunar infarcts are present in the thalami bilaterally. T2 signal changes are associated with the acute infarct. More inferior central and posterolateral remote lacunar infarcts are present within the lower pons. The cerebellum is within normal limits. Vascular: Abnormal signal is present in the non dominant left vertebral artery, suggesting thrombus. The right vertebral artery and basilar artery are patent. Fetal type posterior cerebral arteries are present bilaterally. Flow is present within the anterior circulation. Skull and upper cervical spine: The craniocervical junction is normal.  Upper cervical spine is within normal limits. Marrow signal is unremarkable. Sinuses/Orbits: Patient is status post bilateral maxillary antrostomies and partial ethmoidectomies. Mild residual mucosal thickening is present within the ethmoid air cells. The right maxillary sinus is shrunken. The mastoid air cells are clear. IMPRESSION: 1. 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 2. Abnormal signal in the non dominant left vertebral artery, suggesting thrombus. 3. Progressive atrophy and diffuse white matter disease likely reflects the sequela of chronic microvascular ischemia. 4. Remote lacunar infarcts of the right lentiform nucleus and bilateral thalami. Electronically Signed   By: San Morelle M.D.   On: 07/25/2019 13:55    Medications:  I have reviewed the patient's current medications. Scheduled: . aspirin EC  325 mg Oral Daily  . atorvastatin  80 mg Oral QHS  . clopidogrel  75 mg Oral Daily  . enoxaparin (LOVENOX) injection  40 mg Subcutaneous Q24H  . irbesartan  75 mg Oral Daily  . levothyroxine  150 mcg Oral QAC breakfast  . metoprolol tartrate  25 mg Oral BID  . omega-3 acid ethyl esters  1,000 mg Oral q morning - 10a  . pantoprazole  40 mg Oral Daily  . tamsulosin  0.4 mg Oral QHS    Assessment/Plan: 79 y.o. male with a history of prior stroke, HTN and HLD presenting with new onset visual changes, numbness and gait ataxia.  Reports improvement today.  Patient on ASA prior to admission.  MRI of the brain personally reviewed and shows an acute left lateral pontine infarct.  CTA shows RICA stenosis at approximately [id number] stenosis, LICA at 0000000 stenosis.  Moderate to severe stenosis seen at the mid basilar, right PCA and bilateral P2.   A1c pending.  LDL 108.  Echocardiogram pending.    Stroke Risk Factors -  hyperlipidemia and hypertension  Plan: 1. Echocardiogram pending 2. Prophylactic therapy-Dual antiplatelet therapy with ASA 81mg  and Plavix 75mg  for three weeks  with change to Plavix 75mg  daily alone as monotherapy after that time. 3. Telemetry monitoring.  If unremarkable during this hospitalization would have patient follow up with cardiology on an outpatient basis for evaluation for prolonged cardiac monitoring  4. Aggressive lipid management with target LDL<70.  A1c pending. 5. Follow up with neurology on an outpatient basis   LOS: 1 day   Alexis Goodell, MD Neurology [phone] 07/26/2019  9:14 AM

## 2019-07-26 NOTE — Evaluation (Signed)
Physical Therapy Evaluation Patient Details Name: Warren Ingram MRN: [id number] DOB: May 24, 1941 Today's Date: 07/26/2019   History of Present Illness  Pt is a 79 y.o. male with a known history of prior stroke, hypertension, hyperlipidemia, Gerd, arthritis. 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 Imaging revealed a L pons CVA.    Clinical Impression  Pt pleasant and motivated to participate during the session. Pt reports feeling much better this morning than yesterday. PT assessment of CN integrity, sensation and coordination testing, ROM, MMT, sitting and standing balance was grossly WFL. Impairments found (HoH, decreased sensation of the LLE following vein extraction for CABG, decreased ROM of the L shoulder following surgery, decreased ROM and strength of bil LE following bil TKA) were reported to be baseline by the pt. Pt stated that the impairments that he came to the hospital for yesterday (blurred vision, difficulty walking, and decreased sensation of the R ear and post head) have resolved since yesterday. Ambulation was unsteady and per pt, it was a lot better than yesterday, but not as steady as he normally is. Without an AD, pt tends to furniture-surf when amb and without any available UE assist, pt had a decreased stride length bilat and was visibly unsteady. With a RW, pt appeared more steady, increase stride length, and increased gait velocity. Upon author's request for pt to use RW initially, pt indicated his preference to not use a AD and emphasized not needing to use them after previous surgeries. Pt was educated on his difference in gait with and without a RW and was educated on being at an increased fall risk as he recovers from this CVA. Pt will benefit from HHPT services upon discharge to safely address deficits listed in patient problem list for decreased caregiver assistance and eventual return to  PLOF.       Follow Up Recommendations Home health PT    Equipment Recommendations  None recommended by PT    Recommendations for Other Services       Precautions / Restrictions Precautions Precautions: None Precaution Comments: Chart indicates he is a low fall risk. Restrictions Weight Bearing Restrictions: No      Mobility  Bed Mobility Overal bed mobility: Modified Independent             General bed mobility comments: inc time but no physical assisatnce needed  Transfers Overall transfer level: Modified independent Equipment used: Rolling walker (2 wheeled) Transfers: Sit to/from Stand          General transfer comment: Slow and more effortful sit to/from stand transfer secondary to two previous TKA  Ambulation/Gait Ambulation/Gait assistance: Min guard Gait Distance (Feet): 150' x2  Assistive device: Rolling walker (2 wheeled)(first 150 feet of amb was without RW, second 150 feet was with RW) Gait Pattern/deviations: Step-through pattern;Trunk flexed;Step-to pattern;Decreased stride length Gait velocity: decreased   General Gait Details: pt cued to stand more upright and walk within the walker. Pt appeared unsteady when ambulating without the RW but was much more steady with the RW. Without the RW, pt had a step-to patten with a decreased stride length and flatfoot contact. With the RW, pt achieved a step-through gait pattern and heel strike  Stairs Stairs: Yes Stairs assistance: CGA (Device/Increase time) Stair Management: Two rails;Step to pattern;Forwards Number of Stairs: 4 General stair comments: inc time and effort but no physical assistance required  Wheelchair Mobility  Modified Rankin (Stroke Patients Only)       Balance Overall balance assessment: CGA   Sitting balance-Leahy Scale: Normal       Standing balance-Leahy Scale: Good Standing balance comment: pt able to stand statictly with UE support. Pt was able to amb without a RW  but was visibly unsteady         Rhomberg - Eyes Opened: (WNL) Rhomberg - Eyes Closed: (WNL, moderate sway but able to hold position with no LOS)                 Pertinent Vitals/Pain Pain Assessment: No/denies pain    Home Living Family/patient expects to be discharged to:: Private residence Living Arrangements: Spouse/significant other Available Help at Discharge: Family;Available 24 hours/day Type of Home: House Home Access: Stairs to enter Entrance Stairs-Rails: Left;Right(cannot reach both at the same time) Entrance Stairs-Number of Steps: 2 Home Layout: Able to live on main level with bedroom/bathroom Home Equipment: Walker - 2 wheels;Cane - single point;Grab bars - tub/shower;Toilet riser;Hand held shower head;Shower seat Additional Comments: Pt states he has a shower chair but does not use it.Pt states that he uses doorknob and and counter to get up from toilet    Prior Function Level of Independence: Independent         Comments: Indep with ADLs, household and community mobilization without assist device; Pt able to drive and be out of the home;     Hand Dominance   Dominant Hand: Right    Extremity/Trunk Assessment   Upper Extremity Assessment Upper Extremity Assessment: Overall WFL for tasks assessed(Pt with hx of CVA with weakness and mild weakness noted in RUE but has full functional AROM for ADLs and intact sensation.)    Lower Extremity Assessment Lower Extremity Assessment: Generalized weakness;LLE deficits/detail;RLE deficits/detail RLE: Unable to fully assess due to pain(pt had recent TKA) RLE Sensation: WNL RLE Coordination: WNL LLE: Unable to fully assess due to pain(pt had recent TKA) LLE Sensation: decreased light touch(pt reported a hx of decreased sensation in the L LE secondary to a previous CABG. Unchanged with current hospital admission) LLE Coordination: WNL       Communication   Communication: HOH  Cognition Arousal/Alertness:  Awake/alert Behavior During Therapy: WFL for tasks assessed/performed Overall Cognitive Status: Within Functional Limits for tasks assessed                                        General Comments      Exercises Other Exercises Other Exercises: RW training Other Exercises: stair training   Assessment/Plan    PT Assessment Patient needs continued PT services  PT Problem List Decreased strength;Decreased mobility;Decreased safety awareness;Decreased range of motion;Decreased balance;Decreased knowledge of use of DME       PT Treatment Interventions DME instruction;Therapeutic exercise;Balance training;Gait training;Stair training;Functional mobility training;Therapeutic activities    PT Goals (Current goals can be found in the Care Plan section)  Acute Rehab PT Goals Patient Stated Goal: "to go home as soon as I can" PT Goal Formulation: With patient Time For Goal Achievement: 08/08/19    Frequency Min 2X/week   Barriers to discharge        Co-evaluation               AM-PAC PT "6 Clicks" Mobility  Outcome Measure Help needed turning from your back to your side while in a flat bed without  using bedrails?: A Little Help needed moving from lying on your back to sitting on the side of a flat bed without using bedrails?: A Little Help needed moving to and from a bed to a chair (including a wheelchair)?: A Little Help needed standing up from a chair using your arms (e.g., wheelchair or bedside chair)?: A Little Help needed to walk in hospital room?: A Little Help needed climbing 3-5 steps with a railing? : A Little 6 Click Score: 18    End of Session Equipment Utilized During Treatment: Gait belt Activity Tolerance: Patient tolerated treatment well Patient left: in chair;with call bell/phone within reach Nurse Communication: Mobility status PT Visit Diagnosis: Unsteadiness on feet (R26.81);Muscle weakness (generalized) (M62.81)    Time:  [id number] PT Time Calculation (min) (ACUTE ONLY): 33 min   Charges:              Annabelle Harman, SPT 07/26/19 1:53 PM

## 2019-07-26 NOTE — Discharge Summary (Signed)
[place] [facility] [place] NAME: Warren Ingram    MR#:  [id number]  DATE OF BIRTH:  June 07, 1940  DATE OF ADMISSION:  07/25/2019   ADMITTING PHYSICIAN: Fritzi Mandes, MD  DATE OF DISCHARGE: No discharge date for patient encounter.  PRIMARY CARE PHYSICIAN: Idelle Crouch, MD   ADMISSION DIAGNOSIS:  CVA (cerebral vascular accident) [facility]) [I63.9] Acute CVA (cerebrovascular accident) ([place]) [I63.9] DISCHARGE DIAGNOSIS:  Active Problems:   CVA (cerebral vascular accident) ([place])  SECONDARY DIAGNOSIS:   Past Medical History:  Diagnosis Date  . Arthritis   . Barrett esophagus   . Benign neoplasm of abdomen   . Benign neoplasm of colon   . Cancer (Warren Ingram)    skin cancer  . Coronary artery disease   . GERD (gastroesophageal reflux disease)   . Heart murmur    ASYMPTOMATIC  . History of hiatal hernia   . Hypercholesterolemia   . Hypertension   . Hypothyroidism   . Stroke [facility]) 2015   mini stroke   HOSPITAL COURSE:  [name]  is a 79 y.o. male with a known history of prior stroke, hypertension, hyperlipidemia, Gerd, arthritis presenting with new onset visual changes, numbness and gait ataxia.   1. Acute left lateral pons infarct -confirmed on MRI brain Reports improvement today. -CTA shows RICA stenosis at approximately [id number] stenosis, LICA at 0000000 stenosis.  Moderate to severe stenosis seen at the mid basilar, right PCA and bilateral P2.   - Echocardiogram not showing any thrombus - Prophylactic therapy-Dual antiplatelet therapy with ASA 81mg  and Plavix 75mg  for three weeks with change toPlavix 75mg  dailyalone as monotherapy after that time. -Outpatient cardiology follow-up for evaluation for prolonged cardiac monitoring  - Aggressive lipid management with target LDL<70.   - Follow up with neurology on an outpatient basis  2. CAD status post CABG times four -continue aspirin and statins -stable no chest pain -follows with Dr.  Ubaldo Glassing  3. HL -continue statins  4. Recent right knee surgery -stable DISCHARGE CONDITIONS:  Stable CONSULTS OBTAINED:  Treatment Team:  Catarina Hartshorn, MD Alexis Goodell, MD DRUG ALLERGIES:  No Known Allergies DISCHARGE MEDICATIONS:   Allergies as of 07/26/2019   No Known Allergies     Medication List    STOP taking these medications   traMADol 50 MG tablet Commonly known as: ULTRAM     TAKE these medications   aspirin EC 81 MG EC tablet Generic drug: aspirin Take 325 mg by mouth daily. What changed: Another medication with the same name was removed. Continue taking this medication, and follow the directions you see here.   atorvastatin 80 MG tablet Commonly known as: LIPITOR Take 80 mg by mouth at bedtime.   clopidogrel 75 MG tablet Commonly known as: PLAVIX Take 1 tablet (75 mg total) by mouth daily. Start taking on: July 27, 2019   Fish Oil 1200 MG Caps Take 1,200 mg by mouth 2 (two) times a day.   fluticasone 50 MCG/ACT nasal spray Commonly known as: FLONASE Place 1 spray into both nostrils as needed.   levothyroxine 150 MCG tablet Commonly known as: SYNTHROID Take 150 mcg by mouth daily before breakfast.   metoprolol tartrate 25 MG tablet Commonly known as: LOPRESSOR Take 25 mg by mouth 2 (two) times daily.   omeprazole 20 MG capsule Commonly known as: PRILOSEC Take 20 mg by mouth every morning.   tamsulosin 0.4 MG Caps capsule Commonly known as: FLOMAX Take  2 capsules (0.8 mg total) by mouth at bedtime. What changed: how much to take   telmisartan 40 MG tablet Commonly known as: MICARDIS Take 40 mg by mouth every morning.      DISCHARGE INSTRUCTIONS:   DIET:  Cardiac diet DISCHARGE CONDITION:  Stable ACTIVITY:  Activity as tolerated OXYGEN:  Home Oxygen: No.  Oxygen Delivery: room air DISCHARGE LOCATION:  home   If you experience worsening of your admission symptoms, develop shortness of breath, life threatening  emergency, suicidal or homicidal thoughts you must seek medical attention immediately by calling 911 or calling your MD immediately  if symptoms less severe.  You Must read complete instructions/literature along with all the possible adverse reactions/side effects for all the Medicines you take and that have been prescribed to you. Take any new Medicines after you have completely understood and accpet all the possible adverse reactions/side effects.   Please note  You were cared for by a hospitalist during your hospital stay. If you have any questions about your discharge medications or the care you received while you were in the hospital after you are discharged, you can call the unit and asked to speak with the hospitalist on call if the hospitalist that took care of you is not available. Once you are discharged, your primary care physician will handle any further medical issues. Please note that NO REFILLS for any discharge medications will be authorized once you are discharged, as it is imperative that you return to your primary care physician (or establish a relationship with a primary care physician if you do not have one) for your aftercare needs so that they can reassess your need for medications and monitor your lab values.    On the day of Discharge:  VITAL SIGNS:  Blood pressure (!) 157/88, pulse 63, temperature 98 F (36.7 C), resp. rate 18, height 5\' 4"  (1.626 m), weight 81.2 kg, SpO2 95 %. PHYSICAL EXAMINATION:  GENERAL:  79 y.o.-year-old patient lying in the bed with no acute distress.  EYES: Pupils equal, round, reactive to light and accommodation. No scleral icterus. Extraocular muscles intact.  HEENT: Head atraumatic, normocephalic. Oropharynx and nasopharynx clear.  NECK:  Supple, no jugular venous distention. No thyroid enlargement, no tenderness.  LUNGS: Normal breath sounds bilaterally, no wheezing, rales,rhonchi or crepitation. No use of accessory muscles of respiration.    CARDIOVASCULAR: S1, S2 normal. No murmurs, rubs, or gallops.  ABDOMEN: Soft, non-tender, non-distended. Bowel sounds present. No organomegaly or mass.  EXTREMITIES: No pedal edema, cyanosis, or clubbing.  NEUROLOGIC: Cranial nerves II through XII are intact. Muscle strength 5/5 in all extremities. Sensation intact. Gait not checked.  PSYCHIATRIC: The patient is alert and oriented x 3.  SKIN: No obvious rash, lesion, or ulcer.  DATA REVIEW:   CBC Recent Labs  Lab 07/25/19 1117  WBC 7.3  HGB 14.1  HCT 43.2  PLT 306    Chemistries  Recent Labs  Lab 07/25/19 1117  NA 139  K 4.1  CL 99  CO2 26  GLUCOSE 106*  BUN 13  CREATININE 0.79  CALCIUM 9.5  AST 24  ALT 30  ALKPHOS 140*  BILITOT 0.6     Outpatient follow-up Follow-up Information    Idelle Crouch, MD. Schedule an appointment as soon as possible for a visit in 1 week(s).   Specialty: Internal Medicine Contact information: [place] [address] [phone]        Vladimir Crofts, MD. Schedule an appointment  as soon as possible for a visit in 2 week(s).   Specialty: Neurology Contact information: [place] [facility] [place] [address] [phone]            Management plans discussed with the patient, family and they are in agreement.  CODE STATUS: DNR   TOTAL TIME TAKING CARE OF THIS PATIENT: 45 minutes.    Max Sane M.D on 07/26/2019 at 4:15 PM  [facility]   CC: Primary care physician; Idelle Crouch, MD   Note: This dictation was prepared with Dragon dictation along with smaller phrase technology. Any transcriptional errors that result from this process are unintentional.

## 2020-01-05 ENCOUNTER — Ambulatory Visit: Payer: Medicare PPO | Admitting: Urology

## 2020-02-12 ENCOUNTER — Other Ambulatory Visit: Payer: Self-pay

## 2020-02-12 ENCOUNTER — Encounter: Payer: Self-pay | Admitting: Physical Therapy

## 2020-02-12 ENCOUNTER — Ambulatory Visit: Payer: Medicare PPO | Attending: Neurology

## 2020-02-12 DIAGNOSIS — Z9181 History of falling: Secondary | ICD-10-CM | POA: Insufficient documentation

## 2020-02-12 DIAGNOSIS — M6281 Muscle weakness (generalized): Secondary | ICD-10-CM | POA: Insufficient documentation

## 2020-02-12 DIAGNOSIS — R2689 Other abnormalities of gait and mobility: Secondary | ICD-10-CM | POA: Insufficient documentation

## 2020-02-12 NOTE — Therapy (Signed)
[place] [address] [place], [place], [address] Phone: [phone]   Fax:  [phone]  Physical Therapy Evaluation  Patient Details  Name: Warren Ingram MRN: [id number] Date of Birth: 21-Apr-1941 No data recorded  Encounter Date: 02/12/2020   PT End of Session - 02/12/20 1249    Visit Number 1    Number of Visits 9    Date for PT Re-Evaluation 03/11/20    Authorization - Visit Number 1    Authorization - Number of Visits 10    PT Start Time 1115    PT Stop Time 1202    PT Time Calculation (min) 47 min    Equipment Utilized During Treatment Gait belt    Activity Tolerance Patient tolerated treatment well    Behavior During Therapy WFL for tasks assessed/performed           Past Medical History:  Diagnosis Date  . Arthritis   . Barrett esophagus   . Benign neoplasm of abdomen   . Benign neoplasm of colon   . Cancer ([place])    skin cancer  . Coronary artery disease   . GERD (gastroesophageal reflux disease)   . Heart murmur    ASYMPTOMATIC  . History of hiatal hernia   . Hypercholesterolemia   . Hypertension   . Hypothyroidism   . Stroke [facility]) 2015   mini stroke    Past Surgical History:  Procedure Laterality Date  . CARDIAC CATHETERIZATION    . COLONOSCOPY WITH PROPOFOL N/A 01/01/2018   Procedure: COLONOSCOPY WITH PROPOFOL;  Surgeon: Jonathon Bellows, MD;  Location: [facility] ENDOSCOPY;  Service: Gastroenterology;  Laterality: N/A;  . CORONARY ARTERY BYPASS GRAFT  08/2017   4 vessels  . ESOPHAGOGASTRODUODENOSCOPY (EGD) WITH PROPOFOL N/A 10/28/2015   Procedure: ESOPHAGOGASTRODUODENOSCOPY (EGD) WITH PROPOFOL;  Surgeon: Hulen Luster, MD;  Location: [facility] ENDOSCOPY;  Service: Gastroenterology;  Laterality: N/A;  . KNEE ARTHROSCOPY    . LEFT HEART CATH AND CORONARY ANGIOGRAPHY N/A 07/25/2017   Procedure: LEFT HEART CATH AND CORONARY ANGIOGRAPHY;  Surgeon: Teodoro Spray, MD;  Location: [facility] CV LAB;  Service:  Cardiovascular;  Laterality: N/A;  . NASAL SINUS SURGERY    . PARTIAL KNEE ARTHROPLASTY Right 06/27/2019   Procedure: UNICOMPARTMENTAL KNEE;  Surgeon: Corky Mull, MD;  Location: [facility] ORS;  Service: Orthopedics;  Laterality: Right;  . TOTAL KNEE ARTHROPLASTY Left 12/19/2018   Procedure: TOTAL KNEE ARTHROPLASTY;  Surgeon: Corky Mull, MD;  Location: [facility] ORS;  Service: Orthopedics;  Laterality: Left;  Marland Kitchen VASECTOMY      There were no vitals filed for this visit.    Subjective Assessment - 02/12/20 1239    Subjective Pt is referred to PT for imbalance and is here with his wife. Pt reports he had a stroke in february of this year that initially affected his strength, balance, and cognitive status causing R eye blindness with reports of inability to stand up on his own at the hospital. Today, pt reports has has normal strength and denies N/T and use of of no AD. Pt reports single level home and same floor set up with 4 STE and railing on R side. Pt describes no balance issues as long as he looks down at his feet while walking and difficulty getting in and out of the car. Pt denies any falls since getting out of the hospital. Pt reports having an active lifestyle walking 1 mile a day and enjoys hunting and fishing  which he has not done since his stroke. He reports mild memory loss since stroke and reports with spouse his MD suspects possible diagnosis of dementia. Pt's goal with therapy is to improve his walking, balance, getting in/out of car, and ability to lift boxes on/off the ground into a vehicle.    Pertinent History R visual deficits, MD suspicious of possible Dementia.    Currently in Pain? No/denies           OBJECTIVE MUSCULOSKELETAL: Tremor: Absent Bulk: Normal Tone: Normal  Posture Slight forward head posture.   Gait Pt amb with intermittent variable step lengths, decreased step length on RLE due to decreased stance time on LLE. Noted scuffing of RLE heel when amb due to  decreased foot clearance.   Strength R/L 5/5 Hip flexion 4/4 Hip external rotation 4/4 Hip internal rotation 5/5 Hip abduction (seated) 5/5 Hip adduction (seated) 5/5 Knee extension 5/5 Knee flexion 5/5 Ankle Plantarflexion 5/5 Ankle Dorsiflexion 5/5 Ankle Inversion  5/5 Ankle Eversion   NEUROLOGICAL: Mental Status Patient's fund of knowledge is within normal limits for educational level. Pt and spouse reports difficulty with memory since stroke.   Sensation Grossly intact to light touch bilateral UEs/LEs as determined by testing dermatomes C2-T2/L2-S2 respectively.  Proprioception testing for BLE's: Ankle proprioception testing WFL bilaterally.   Coordination/Cerebellar Finger to Nose: RUE finger to nose WNL and LUE finger to nose with mild impairment demonstrating decreased speed and mild dysmetria. Heel to Shin: Bilateral decreased motor control. Rapid alternating movements: WNL Pronator Drift: Negative  FUNCTIONAL OUTCOME MEASURES   Results Comments  BERG 40/56 Fall risk, in need of intervention  DGI 15/24       5TSTS 13.86 seconds       10 Meter Gait Speed Self-selected: 0.83 m/s; Fastest: s = 1.06 m/s Below normative values for full community ambulation for normal walking speed.       FOTO score: 77/74   PT Education - 02/12/20 1248    Education Details HEP: SLS, tandem, and marches at counter top and seated B hip IR AROM    Person(s) Educated Patient    Methods Explanation;Demonstration;Handout    Comprehension Verbalized understanding               PT Long Term Goals - 02/12/20 1252      PT LONG TERM GOAL #1   Title Pt will improve Berg balance score to >52/56 to demonstrate clinicially significnat decreased risk of falls.    Baseline 9/9: 40/56    Time 4    Period Weeks    Status New    Target Date 03/11/20      PT LONG TERM GOAL #2   Title Pt will improve DGI to > 22/24 to demonstrate pt is a safe community ambulator and at decreased  risk of falls with ambulatory tasks.    Baseline 9/9: 15/24    Time 4    Period Weeks    Status New    Target Date 03/11/20      PT LONG TERM GOAL #3   Title Pt will be able to independently get in and out of car with no LOB and no assistance from spouse    Baseline 9/9: Pt reports difficulty getting in and out of car and requires extra time to complete.    Time 4    Period Weeks    Status New    Target Date 03/11/20      PT LONG TERM GOAL #4  Title Pt will improve normal gait speed to > 1.0 m/s with no AD and normalized gait pattern to improve safe ambulation.    Baseline 9/9: normal speed 0.83 with varying step length.    Time 4    Period Weeks    Status New    Target Date 03/11/20                   Patient will benefit from skilled therapeutic intervention in order to improve the following deficits and impairments:     Visit Diagnosis: At risk for falls  Other abnormalities of gait and mobility  Muscle weakness (generalized)     Problem List Patient Active Problem List   Diagnosis Date Noted  . CVA (cerebral vascular accident) ([place]) 07/25/2019  . Acute CVA (cerebrovascular accident) ([place])   . Hyperlipidemia   . 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 Hematochezia 12/30/2017  . Hypothyroidism 12/30/2017  . 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 Benign neoplasm of colon 08/21/2013  . CAD in native artery 08/21/2013  . Diverticulitis of colon 08/21/2013  . GERD (gastroesophageal reflux disease) 08/21/2013  . Benign essential HTN 08/21/2013  . Anal bleeding 08/21/2013  . Arthritis, degenerative 08/21/2013  . 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 Pure hypercholesterolemia 08/21/2013  . Adenomatous colon polyp 08/21/2013  . Osteoarthrosis, unspecified whether generalized or localized, pelvic region and thigh 08/21/2013    Larna Daughters,  SPT 02/12/2020, 2:08 PM  [facility] [address] [place], [place], [address] Phone: [phone]   Fax:  [phone]  Name: Warren Ingram MRN: [id number] Date of Birth: 03-27-1941

## 2020-02-17 ENCOUNTER — Encounter: Payer: Self-pay | Admitting: Physical Therapy

## 2020-02-17 ENCOUNTER — Other Ambulatory Visit: Payer: Self-pay

## 2020-02-17 ENCOUNTER — Ambulatory Visit: Payer: Medicare PPO

## 2020-02-17 DIAGNOSIS — Z9181 History of falling: Secondary | ICD-10-CM | POA: Diagnosis not present

## 2020-02-17 DIAGNOSIS — M6281 Muscle weakness (generalized): Secondary | ICD-10-CM

## 2020-02-17 DIAGNOSIS — R2689 Other abnormalities of gait and mobility: Secondary | ICD-10-CM

## 2020-02-17 NOTE — Therapy (Signed)
[facility] [address] [place], [place], [address] Phone: [phone]   Fax:  [phone]  Physical Therapy Treatment  Patient Details  Name: Warren Ingram MRN: [id number] Date of Birth: 12-19-1940 Referring Provider (PT): Vladimir Crofts, MD   Encounter Date: 02/17/2020   PT End of Session - 02/17/20 1120    Visit Number 2    Number of Visits 9    Date for PT Re-Evaluation 03/11/20    Authorization - Visit Number 2    Authorization - Number of Visits 10    PT Start Time 1116    PT Stop Time 9528    PT Time Calculation (min) 43 min    Equipment Utilized During Treatment Gait belt    Activity Tolerance Patient tolerated treatment well    Behavior During Therapy WFL for tasks assessed/performed           Past Medical History:  Diagnosis Date   Arthritis    Barrett esophagus    Benign neoplasm of abdomen    Benign neoplasm of colon    Cancer ([place])    skin cancer   Coronary artery disease    GERD (gastroesophageal reflux disease)    Heart murmur    ASYMPTOMATIC   History of hiatal hernia    Hypercholesterolemia    Hypertension    Hypothyroidism    Stroke ([place]) 2015   mini stroke    Past Surgical History:  Procedure Laterality Date   CARDIAC CATHETERIZATION     COLONOSCOPY WITH PROPOFOL N/A 01/01/2018   Procedure: COLONOSCOPY WITH PROPOFOL;  Surgeon: Jonathon Bellows, MD;  Location: [facility] ENDOSCOPY;  Service: Gastroenterology;  Laterality: N/A;   CORONARY ARTERY BYPASS GRAFT  08/2017   4 vessels   ESOPHAGOGASTRODUODENOSCOPY (EGD) WITH PROPOFOL N/A 10/28/2015   Procedure: ESOPHAGOGASTRODUODENOSCOPY (EGD) WITH PROPOFOL;  Surgeon: Hulen Luster, MD;  Location: [facility] ENDOSCOPY;  Service: Gastroenterology;  Laterality: N/A;   KNEE ARTHROSCOPY     LEFT HEART CATH AND CORONARY ANGIOGRAPHY N/A 07/25/2017   Procedure: LEFT HEART CATH AND CORONARY ANGIOGRAPHY;  Surgeon: Teodoro Spray, MD;  Location: [place] CV LAB;  Service: Cardiovascular;  Laterality: N/A;   NASAL SINUS SURGERY     PARTIAL KNEE ARTHROPLASTY Right 06/27/2019   Procedure: UNICOMPARTMENTAL KNEE;  Surgeon: Corky Mull, MD;  Location: [facility] ORS;  Service: Orthopedics;  Laterality: Right;   TOTAL KNEE ARTHROPLASTY Left 12/19/2018   Procedure: TOTAL KNEE ARTHROPLASTY;  Surgeon: Corky Mull, MD;  Location: [facility] ORS;  Service: Orthopedics;  Laterality: Left;   VASECTOMY      There were no vitals filed for this visit.   Subjective Assessment - 02/17/20 1120    Subjective Pt. reports he was able to complete chairs and other furniture over the weekend with no problems. Pt. additionally reports that he was not able to complete HEP due to being "too busy."    Pertinent History R visual deficits, MD suspicious of possible Dementia.    Currently in Pain? No/denies             Neuro Re-ed:  Tandem standing in // bars: pt with SBA-CGA, alternating stance feet. Pt able to complete tandem standing with eyes open, progressed to standing with eyes closed for 30s each forward foot for several bouts ea side, with eyes open/eye closed. Pt demonstrated more difficulty with L foot in front compared to R.   SLS in // bars: pt with SBA-CGA, using hand  on // bar. Pt. Switched to finger support on // bar. Pt alternated feet, approx 30s holds on either for several bouts. Pt educated on completing balance exercises next to a counter top as opposed to by his walker for safety purposes.   Walking in hallway with horizontal head turns (2x56ft): pt required CGA throughout. Pt required verbal cueing to decrease speed of head turns. Pt demonstrated drift to direction he is moving in. Pt progressed to reading letters in hallway out loud to therapist as he was walking.    Walking in hallway while pt reading letters on hall walls (dual tasking) 2x27ft  Walking in hallway with vertical head turns (2x104ft): Pt with CGA throughout. Pt demonstrated  decrease in gait speed with each vertical head turn, and is unable to remain looking down for prolonged periods of time.   Walking in hallway with alternating UE contralateral knee tap (2x38ft): Pt unable to complete with smooth progression of gait, demonstrates slight LOB but able to correct with stepping strategy. Pt instructed to complete activity with slower speed, able to complete activity with smoother progression of gait and fewer LOB.   G[place]vine walking in hallway (2x29ft): pt initially with bilat hands on wall. Pt demonstrates increased difficulty taking backwards step around foot, occasionally catching toe on heels with this motion. Pt able to self-correct toe catching. Pt required cueing for foot placement to maximize forward progression/balance throughout  Standing in front of // bar single leg double stack cone taps (x12 ea foot): pt initially practiced with bilat UE support, decreased to unilat UE support, then no UE support on bars. Pt demonstrates increased lateral leaning bilat to complete activity, decreased control with returning foot to ground bilat. Pt required SBA-CGA throughout.   Standing marches in front of // bars: 10x each leg with bilat UE support. Pt required SBA and cueing to decrease speed.    Seated hip IR and ER: 10x each leg. Pt required verbal and tactile cueing to facilitate hip motions.    PT Long Term Goals - 02/12/20 1252      PT LONG TERM GOAL #1   Title Pt will improve Berg balance score to >52/56 to demonstrate clinicially significnat decreased risk of falls.    Baseline 9/9: 40/56    Time 4    Period Weeks    Status New    Target Date 03/11/20      PT LONG TERM GOAL #2   Title Pt will improve DGI to > 22/24 to demonstrate pt is a safe community ambulator and at decreased risk of falls with ambulatory tasks.    Baseline 9/9: 15/24    Time 4    Period Weeks    Status New    Target Date 03/11/20      PT LONG TERM GOAL #3   Title Pt will be able  to independently get in and out of car with no LOB and no assistance from spouse    Baseline 9/9: Pt reports difficulty getting in and out of car and requires extra time to complete.    Time 4    Period Weeks    Status New    Target Date 03/11/20      PT LONG TERM GOAL #4   Title Pt will improve normal gait speed to > 1.0 m/s with no AD and normalized gait pattern to improve safe ambulation.    Baseline 9/9: normal speed 0.83 with varying step length.    Time 4  Period Weeks    Status New    Target Date 03/11/20                 Plan - 02/17/20 1158    Clinical Impression Statement Pt. completed multiple static and dynamic balance activities during today's session. Pt. required overall mod verbal cueing for decreasing speed of most activities and focusing more on control of activities. Pt. demonstrated few LOB during grapevine walking and walking alternating UE knee taps, but was able to self correct all instances. Pt. demonstrates improved ability to complete all activities when instructed to slow down. Pt. demonstrated increased difficulty with walking with horizontal and vertical head turns with deviating from straight gait path and inability to maintain smooth forward progression of gait. Pt. will continue to benefit from skilled PT to improve dynamic balance to decrease falls risk.    Personal Factors and Comorbidities Age;Comorbidity 3+;Past/Current Experience    Examination-Activity Limitations Dressing;Lift;Stand;Stairs;Squat;Locomotion Level    Examination-Participation Restrictions Community Activity    Stability/Clinical Decision Making Stable/Uncomplicated    Clinical Decision Making Low    Rehab Potential Good    PT Frequency 2x / week    PT Duration 4 weeks    PT Treatment/Interventions DME Instruction;Gait training;Stair training;Functional mobility training;Therapeutic activities;Therapeutic exercise;Balance training;Neuromuscular re-education;Patient/family  education    PT Next Visit Plan Reassess HEP    PT Home Exercise Plan Access Code: [email or web address]    Consulted and Agree with Plan of Care Patient;Family member/caregiver           Patient will benefit from skilled therapeutic intervention in order to improve the following deficits and impairments:  Abnormal gait, Improper body mechanics, Decreased mobility, Decreased range of motion, Decreased strength, Difficulty walking, Decreased balance  Visit Diagnosis: At risk for falls  Other abnormalities of gait and mobility  Muscle weakness (generalized)     Problem List Patient Active Problem List   Diagnosis Date Noted   CVA (cerebral vascular accident) ([place]) 07/25/2019   Acute CVA (cerebrovascular accident) ([place])    Hyperlipidemia    Status post total knee replacement using cement, 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   Hematochezia 12/30/2017   Hypothyroidism 12/30/2017   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   Benign neoplasm of colon 08/21/2013   CAD in native artery 08/21/2013   Diverticulitis of colon 08/21/2013   GERD (gastroesophageal reflux disease) 08/21/2013   Benign essential HTN 08/21/2013   Anal bleeding 08/21/2013   Arthritis, degenerative 08/21/2013   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   Pure hypercholesterolemia 08/21/2013   Adenomatous colon polyp 08/21/2013   Osteoarthrosis, unspecified whether generalized or localized, pelvic region and thigh 08/21/2013    Warren Ingram, Warren Ingram 02/17/2020, 12:04 PM  [facility] [address] [place], [place], [address] Phone: [phone]   Fax:  [phone]  Name: Warren Ingram MRN: [id number] Date of Birth: September 28, 1940

## 2020-02-19 ENCOUNTER — Ambulatory Visit: Payer: Medicare PPO | Admitting: Physical Therapy

## 2020-02-19 ENCOUNTER — Other Ambulatory Visit: Payer: Self-pay

## 2020-02-19 ENCOUNTER — Encounter: Payer: Self-pay | Admitting: Physical Therapy

## 2020-02-19 DIAGNOSIS — Z9181 History of falling: Secondary | ICD-10-CM | POA: Diagnosis not present

## 2020-02-19 DIAGNOSIS — M6281 Muscle weakness (generalized): Secondary | ICD-10-CM

## 2020-02-19 DIAGNOSIS — R2689 Other abnormalities of gait and mobility: Secondary | ICD-10-CM

## 2020-02-19 NOTE — Therapy (Signed)
[facility] [address] [place], [place], [address] Phone: [phone]   Fax:  [phone]  Physical Therapy Treatment  Patient Details  Name: Warren Ingram MRN: [id number] Date of Birth: 1941/04/24 Referring Provider (PT): Vladimir Crofts, MD   Encounter Date: 02/19/2020   PT End of Session - 02/19/20 1221    Visit Number 3    Number of Visits 9    Date for PT Re-Evaluation 03/11/20    Authorization - Visit Number 3    Authorization - Number of Visits 10    PT Start Time 2202    PT Stop Time 1201    PT Time Calculation (min) 45 min    Equipment Utilized During Treatment Gait belt    Activity Tolerance Patient tolerated treatment well    Behavior During Therapy [facility] for tasks assessed/performed           Past Medical History:  Diagnosis Date  . Arthritis   . Barrett esophagus   . Benign neoplasm of abdomen   . Benign neoplasm of colon   . Cancer ([place])    skin cancer  . Coronary artery disease   . GERD (gastroesophageal reflux disease)   . Heart murmur    ASYMPTOMATIC  . History of hiatal hernia   . Hypercholesterolemia   . Hypertension   . Hypothyroidism   . Stroke [facility]) 2015   mini stroke    Past Surgical History:  Procedure Laterality Date  . CARDIAC CATHETERIZATION    . COLONOSCOPY WITH PROPOFOL N/A 01/01/2018   Procedure: COLONOSCOPY WITH PROPOFOL;  Surgeon: Jonathon Bellows, MD;  Location: [facility] ENDOSCOPY;  Service: Gastroenterology;  Laterality: N/A;  . CORONARY ARTERY BYPASS GRAFT  08/2017   4 vessels  . ESOPHAGOGASTRODUODENOSCOPY (EGD) WITH PROPOFOL N/A 10/28/2015   Procedure: ESOPHAGOGASTRODUODENOSCOPY (EGD) WITH PROPOFOL;  Surgeon: Hulen Luster, MD;  Location: [facility] ENDOSCOPY;  Service: Gastroenterology;  Laterality: N/A;  . KNEE ARTHROSCOPY    . LEFT HEART CATH AND CORONARY ANGIOGRAPHY N/A 07/25/2017   Procedure: LEFT HEART CATH AND CORONARY ANGIOGRAPHY;  Surgeon: Teodoro Spray, MD;  Location: [place] CV LAB;  Service: Cardiovascular;  Laterality: N/A;  . NASAL SINUS SURGERY    . PARTIAL KNEE ARTHROPLASTY Right 06/27/2019   Procedure: UNICOMPARTMENTAL KNEE;  Surgeon: Corky Mull, MD;  Location: [facility] ORS;  Service: Orthopedics;  Laterality: Right;  . TOTAL KNEE ARTHROPLASTY Left 12/19/2018   Procedure: TOTAL KNEE ARTHROPLASTY;  Surgeon: Corky Mull, MD;  Location: [facility] ORS;  Service: Orthopedics;  Laterality: Left;  Marland Kitchen VASECTOMY      There were no vitals filed for this visit.   Subjective Assessment - 02/19/20 1220    Subjective Pt reports walking 1.5 miles with his wife with no difficulty. No LOB or falls. Compliant with HEP with no questions about exercises at this time.    Pertinent History R visual deficits, MD suspicious of possible Dementia.          Neuro Re-Ed:   Backwards walking: 2x64'. CGA+1. Verbal cues for increasing BLE step length. Min, intermittent lateral sway with increased step lengths.   Alternating hand taps on contralateral knee: 2x64'. Verbal cues to decrease speed to force pt to stand on single leg for greater period of time. Intermittent MinA+1 to correct LOB.   B tandem standing: 2x30 sec, CGA+1  B tandem walking in // bars: x4. Progress from single UE support to no UE support.   B  SLS: 2x10 sec  Obstacle course: walking over unstable surface  (blue mat with ankle weights underneath) --> stepping over 1/2 bolster to small bolster to large bolster to 6" step --> lateral steps over 3" hurdles (x2 on R, x2 on L) --> alternating cone taps (x5/LE): x4 total. Increased difficulty with cone taps and side steps. CGA+1  Picking up varying objects from floor while standing on blue mat: cones and different shaped balls. CGA+1.   PT Education - 02/19/20 1221    Education Details form/technique with balance exercises.    Person(s) Educated Patient    Methods Explanation;Demonstration;Tactile cues;Verbal cues    Comprehension Verbalized understanding;Returned  demonstration               PT Long Term Goals - 02/12/20 1252      PT LONG TERM GOAL #1   Title Pt will improve Berg balance score to >52/56 to demonstrate clinicially significnat decreased risk of falls.    Baseline 9/9: 40/56    Time 4    Period Weeks    Status New    Target Date 03/11/20      PT LONG TERM GOAL #2   Title Pt will improve DGI to > 22/24 to demonstrate pt is a safe community ambulator and at decreased risk of falls with ambulatory tasks.    Baseline 9/9: 15/24    Time 4    Period Weeks    Status New    Target Date 03/11/20      PT LONG TERM GOAL #3   Title Pt will be able to independently get in and out of car with no LOB and no assistance from spouse    Baseline 9/9: Pt reports difficulty getting in and out of car and requires extra time to complete.    Time 4    Period Weeks    Status New    Target Date 03/11/20      PT LONG TERM GOAL #4   Title Pt will improve normal gait speed to > 1.0 m/s with no AD and normalized gait pattern to improve safe ambulation.    Baseline 9/9: normal speed 0.83 with varying step length.    Time 4    Period Weeks    Status New    Target Date 03/11/20                 Plan - 02/19/20 1222    Clinical Impression Statement Pt performed bouts of obstacle course over uneven surfaces, cone taps, lateral stepping over obstacles, and stepping over 6" step. Pt displayed most diffculty with alternating cone taps and side stepping over hurdles indicating balance deficits during single leg tasks. Pt had intermittent LOB and sway to stance leg. Improved carryover, however, after 2-3 bouts. Pt able to progress to tandem walking and beginning to be able to perform static SLS with no UE support for 10 sec before losing balance. Pt can continue to benefit from skilled PT treatment to improve dynamic balance to reduce risk of falls.    Personal Factors and Comorbidities Age;Comorbidity 3+;Past/Current Experience     Examination-Activity Limitations Dressing;Lift;Stand;Stairs;Squat;Locomotion Level    Examination-Participation Restrictions Community Activity    Stability/Clinical Decision Making Stable/Uncomplicated    Rehab Potential Good    PT Frequency 2x / week    PT Duration 4 weeks    PT Treatment/Interventions DME Instruction;Gait training;Stair training;Functional mobility training;Therapeutic activities;Therapeutic exercise;Balance training;Neuromuscular re-education;Patient/family education    PT Next Visit Plan Dual tasking, SLS  PT Home Exercise Plan Access Code: [email or web address]    Consulted and Agree with Plan of Care Patient;Family member/caregiver           Patient will benefit from skilled therapeutic intervention in order to improve the following deficits and impairments:  Abnormal gait, Improper body mechanics, Decreased mobility, Decreased range of motion, Decreased strength, Difficulty walking, Decreased balance  Visit Diagnosis: At risk for falls  Other abnormalities of gait and mobility  Muscle weakness (generalized)     Problem List Patient Active Problem List   Diagnosis Date Noted  . CVA (cerebral vascular accident) ([place]) 07/25/2019  . Acute CVA (cerebrovascular accident) ([place])   . Hyperlipidemia   . 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 Hematochezia 12/30/2017  . Hypothyroidism 12/30/2017  . 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 Benign neoplasm of colon 08/21/2013  . CAD in native artery 08/21/2013  . Diverticulitis of colon 08/21/2013  . GERD (gastroesophageal reflux disease) 08/21/2013  . Benign essential HTN 08/21/2013  . Anal bleeding 08/21/2013  . Arthritis, degenerative 08/21/2013  . 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 Pure hypercholesterolemia 08/21/2013  . Adenomatous colon polyp 08/21/2013  . Osteoarthrosis, unspecified whether generalized or  localized, pelvic region and thigh 08/21/2013    Larna Daughters, SPT  This entire session was performed under direct supervision and direction of a licensed therapist/therapist assistant . I have personally read, edited and approve of the note as written.  Myles Gip PT, DPT [phone]  02/19/2020, 1:11 PM  [facility] [address] [place], [place], [address] Phone: [phone]   Fax:  [phone]  Name: HAMLET LASECKI MRN: [id number] Date of Birth: 1941-02-14

## 2020-02-24 ENCOUNTER — Other Ambulatory Visit: Payer: Self-pay

## 2020-02-24 ENCOUNTER — Ambulatory Visit: Payer: Medicare PPO | Admitting: Physical Therapy

## 2020-02-24 ENCOUNTER — Encounter: Payer: Self-pay | Admitting: Physical Therapy

## 2020-02-24 DIAGNOSIS — Z9181 History of falling: Secondary | ICD-10-CM

## 2020-02-24 DIAGNOSIS — R2689 Other abnormalities of gait and mobility: Secondary | ICD-10-CM

## 2020-02-24 DIAGNOSIS — M6281 Muscle weakness (generalized): Secondary | ICD-10-CM

## 2020-02-24 NOTE — Therapy (Signed)
[facility] [address] [place], [place], [address] Phone: [phone]   Fax:  [phone]  Physical Therapy Treatment  Patient Details  Name: Warren Ingram MRN: [id number] Date of Birth: 11/10/1940 Referring Provider (PT): Vladimir Crofts, MD   Encounter Date: 02/24/2020   PT End of Session - 02/24/20 1134    Visit Number 4    Number of Visits 9    Date for PT Re-Evaluation 03/11/20    Authorization - Visit Number 4    Authorization - Number of Visits 10    PT Start Time 0034    PT Stop Time 0944    PT Time Calculation (min) 45 min    Equipment Utilized During Treatment Gait belt    Activity Tolerance Patient tolerated treatment well    Behavior During Therapy [facility] for tasks assessed/performed           Past Medical History:  Diagnosis Date  . Arthritis   . Barrett esophagus   . Benign neoplasm of abdomen   . Benign neoplasm of colon   . Cancer ([place])    skin cancer  . Coronary artery disease   . GERD (gastroesophageal reflux disease)   . Heart murmur    ASYMPTOMATIC  . History of hiatal hernia   . Hypercholesterolemia   . Hypertension   . Hypothyroidism   . Stroke [facility]) 2015   mini stroke    Past Surgical History:  Procedure Laterality Date  . CARDIAC CATHETERIZATION    . COLONOSCOPY WITH PROPOFOL N/A 01/01/2018   Procedure: COLONOSCOPY WITH PROPOFOL;  Surgeon: Jonathon Bellows, MD;  Location: [facility] ENDOSCOPY;  Service: Gastroenterology;  Laterality: N/A;  . CORONARY ARTERY BYPASS GRAFT  08/2017   4 vessels  . ESOPHAGOGASTRODUODENOSCOPY (EGD) WITH PROPOFOL N/A 10/28/2015   Procedure: ESOPHAGOGASTRODUODENOSCOPY (EGD) WITH PROPOFOL;  Surgeon: Hulen Luster, MD;  Location[facility] ENDOSCOPY;  Service: Gastroenterology;  Laterality: N/A;  . KNEE ARTHROSCOPY    . LEFT HEART CATH AND CORONARY ANGIOGRAPHY N/A 07/25/2017   Procedure: LEFT HEART CATH AND CORONARY ANGIOGRAPHY;  Surgeon: Teodoro Spray, MD;  Location: [place] CV LAB;  Service: Cardiovascular;  Laterality: N/A;  . NASAL SINUS SURGERY    . PARTIAL KNEE ARTHROPLASTY Right 06/27/2019   Procedure: UNICOMPARTMENTAL KNEE;  Surgeon: Corky Mull, MD;  Location: [facility] ORS;  Service: Orthopedics;  Laterality: Right;  . TOTAL KNEE ARTHROPLASTY Left 12/19/2018   Procedure: TOTAL KNEE ARTHROPLASTY;  Surgeon: Corky Mull, MD;  Location: [facility] ORS;  Service: Orthopedics;  Laterality: Left;  Marland Kitchen VASECTOMY      There were no vitals filed for this visit.   Subjective Assessment - 02/24/20 1131    Subjective Continues to be compliant with HEP and frequently walking at home. No falls reported.    Pertinent History R visual deficits, MD suspicious of possible Dementia.    Currently in Pain? No/denies          There.ex:   Sci-Fit L4 for 5 min (not billed).     Neuro Re-Ed:   B SLS in // bars: 8 sec bilaterally until needing 2 fingers to assist. 2x3 at 8 sec holds. CGA+1   Tandem walking on airex foam. Intermittent LOB and need of UE's to correct balance. X8. CGA+1  B star balance with 3 cones: 2x5 taps. Improved ability to stand on single LE with better control compared to SLS. CGA+1   Side Steps over hurdles in // bars:  3 hurdles, x8. Increased difficulty stepping to R due to more difficult time for pt raising RLE over hurdle. CGA+1   6 " B step ups: 1x8. Supervision only.    PT Education - 02/24/20 1133    Education Details form/technique with balance exercises.    Person(s) Educated Patient    Methods Explanation;Demonstration;Tactile cues;Verbal cues    Comprehension Verbalized understanding;Returned demonstration               PT Long Term Goals - 02/12/20 1252      PT LONG TERM GOAL #1   Title Pt will improve Berg balance score to >52/56 to demonstrate clinicially significnat decreased risk of falls.    Baseline 9/9: 40/56    Time 4    Period Weeks    Status New    Target Date 03/11/20      PT LONG TERM GOAL #2   Title Pt  will improve DGI to > 22/24 to demonstrate pt is a safe community ambulator and at decreased risk of falls with ambulatory tasks.    Baseline 9/9: 15/24    Time 4    Period Weeks    Status New    Target Date 03/11/20      PT LONG TERM GOAL #3   Title Pt will be able to independently get in and out of car with no LOB and no assistance from spouse    Baseline 9/9: Pt reports difficulty getting in and out of car and requires extra time to complete.    Time 4    Period Weeks    Status New    Target Date 03/11/20      PT LONG TERM GOAL #4   Title Pt will improve normal gait speed to > 1.0 m/s with no AD and normalized gait pattern to improve safe ambulation.    Baseline 9/9: normal speed 0.83 with varying step length.    Time 4    Period Weeks    Status New    Target Date 03/11/20                 Plan - 02/24/20 1143    Clinical Impression Statement Pt displaying improvements in SLS activities with alternating cone taps and 6" step ups, however has increased difficulty with static SLS only able to hold bilaterally for 8 sec prior UE use to correct balance. Pt can continue to benefit from skilled PT treatment to improve dynamic balance to redcue risk of falls and improve functional mobility.    Personal Factors and Comorbidities Age;Comorbidity 3+;Past/Current Experience    Examination-Activity Limitations Dressing;Lift;Stand;Stairs;Squat;Locomotion Level    Examination-Participation Restrictions Community Activity    Stability/Clinical Decision Making Stable/Uncomplicated    Rehab Potential Good    PT Frequency 2x / week    PT Duration 4 weeks    PT Treatment/Interventions DME Instruction;Gait training;Stair training;Functional mobility training;Therapeutic activities;Therapeutic exercise;Balance training;Neuromuscular re-education;Patient/family education    PT Next Visit Plan Dual tasking, SLS    PT Home Exercise Plan Access Code: [email or web address]    Consulted and Agree with Plan of  Care Patient;Family member/caregiver           Patient will benefit from skilled therapeutic intervention in order to improve the following deficits and impairments:  Abnormal gait, Improper body mechanics, Decreased mobility, Decreased range of motion, Decreased strength, Difficulty walking, Decreased balance  Visit Diagnosis: At risk for falls  Other abnormalities of gait and mobility  Muscle weakness (generalized)  Problem List Patient Active Problem List   Diagnosis Date Noted  . CVA (cerebral vascular accident) ([place]) 07/25/2019  . Acute CVA (cerebrovascular accident) ([place])   . Hyperlipidemia   . 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 Hematochezia 12/30/2017  . Hypothyroidism 12/30/2017  . 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 Benign neoplasm of colon 08/21/2013  . CAD in native artery 08/21/2013  . Diverticulitis of colon 08/21/2013  . GERD (gastroesophageal reflux disease) 08/21/2013  . Benign essential HTN 08/21/2013  . Anal bleeding 08/21/2013  . Arthritis, degenerative 08/21/2013  . 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 Pure hypercholesterolemia 08/21/2013  . Adenomatous colon polyp 08/21/2013  . Osteoarthrosis, unspecified whether generalized or localized, pelvic region and thigh 08/21/2013   Pura Spice, PT, DPT # [id number] SPT 02/24/2020, 12:25 PM  [facility] [address] [place], [place], [address] Phone: [phone]   Fax:  [phone]  Name: Warren Ingram MRN: [id number] Date of Birth: Jan 27, 1941

## 2020-02-26 ENCOUNTER — Ambulatory Visit: Payer: Medicare PPO | Admitting: Physical Therapy

## 2020-03-01 ENCOUNTER — Other Ambulatory Visit: Payer: Self-pay

## 2020-03-01 ENCOUNTER — Encounter: Payer: Self-pay | Admitting: Physical Therapy

## 2020-03-01 ENCOUNTER — Ambulatory Visit: Payer: Medicare PPO | Admitting: Physical Therapy

## 2020-03-01 DIAGNOSIS — M6281 Muscle weakness (generalized): Secondary | ICD-10-CM

## 2020-03-01 DIAGNOSIS — Z9181 History of falling: Secondary | ICD-10-CM | POA: Diagnosis not present

## 2020-03-01 DIAGNOSIS — R2689 Other abnormalities of gait and mobility: Secondary | ICD-10-CM

## 2020-03-01 NOTE — Therapy (Signed)
[place] [facility] [address] [place], [place], [address] Phone: [phone]   Fax:  [phone]  Physical Therapy Treatment  Patient Details  Name: Warren Ingram MRN: [id number] Date of Birth: 11/03/1940 Referring Provider (PT): Vladimir Crofts, MD   Encounter Date: 03/01/2020   PT End of Session - 03/01/20 0901    Visit Number 5    Number of Visits 9    Date for PT Re-Evaluation 03/11/20    Authorization - Visit Number 5    Authorization - Number of Visits 10    PT Start Time 5885    PT Stop Time 0944    PT Time Calculation (min) 47 min    Equipment Utilized During Treatment Gait belt    Activity Tolerance Patient tolerated treatment well    Behavior During Therapy [facility] for tasks assessed/performed           Past Medical History:  Diagnosis Date  . Arthritis   . Barrett esophagus   . Benign neoplasm of abdomen   . Benign neoplasm of colon   . Cancer ([place])    skin cancer  . Coronary artery disease   . GERD (gastroesophageal reflux disease)   . Heart murmur    ASYMPTOMATIC  . History of hiatal hernia   . Hypercholesterolemia   . Hypertension   . Hypothyroidism   . Stroke [facility]) 2015   mini stroke    Past Surgical History:  Procedure Laterality Date  . CARDIAC CATHETERIZATION    . COLONOSCOPY WITH PROPOFOL N/A 01/01/2018   Procedure: COLONOSCOPY WITH PROPOFOL;  Surgeon: Jonathon Bellows, MD;  Location: [facility] ENDOSCOPY;  Service: Gastroenterology;  Laterality: N/A;  . CORONARY ARTERY BYPASS GRAFT  08/2017   4 vessels  . ESOPHAGOGASTRODUODENOSCOPY (EGD) WITH PROPOFOL N/A 10/28/2015   Procedure: ESOPHAGOGASTRODUODENOSCOPY (EGD) WITH PROPOFOL;  Surgeon: Hulen Luster, MD;  Location: [facility] ENDOSCOPY;  Service: Gastroenterology;  Laterality: N/A;  . KNEE ARTHROSCOPY    . LEFT HEART CATH AND CORONARY ANGIOGRAPHY N/A 07/25/2017   Procedure: LEFT HEART CATH AND CORONARY ANGIOGRAPHY;  Surgeon: Teodoro Spray, MD;  Location: [place] CV LAB;  Service: Cardiovascular;  Laterality: N/A;  . NASAL SINUS SURGERY    . PARTIAL KNEE ARTHROPLASTY Right 06/27/2019   Procedure: UNICOMPARTMENTAL KNEE;  Surgeon: Corky Mull, MD;  Location: [facility] ORS;  Service: Orthopedics;  Laterality: Right;  . TOTAL KNEE ARTHROPLASTY Left 12/19/2018   Procedure: TOTAL KNEE ARTHROPLASTY;  Surgeon: Corky Mull, MD;  Location: [facility] ORS;  Service: Orthopedics;  Laterality: Left;  Marland Kitchen VASECTOMY      There were no vitals filed for this visit.   Subjective Assessment - 03/01/20 0859    Subjective Pt reports falling Wednesday morning standing up from a chair going forward landing on L shoulder and hip on fire place hearth. Pt denies any orthostatic symptoms and any injuries. No falls since wednesday.    Pertinent History R visual deficits, MD suspicious of possible Dementia.    Currently in Pain? No/denies    Multiple Pain Sites No           There ex:  Nustep L5 6 mins. Not billed.  STS practice: Pt initially demonstrates excessive weight on heels when standing. Cueing for increasing forward weight shift throughout activity.  progressed to STS with airex pad under feet. 12x reps with airex pad. Pt required SBA throughout activity.   // bars standing perturbations: perturbations applied to shoulders and hips.  Pt able to recover from all perturbations with feet shoulder width apart. Pt instructed to place feet next to each other, perturbations applied again. Pt instructed to use stepping strategy if necessary. Pt experienced most LOB with lateral and posterior perturbations requiring minA+1 to correct only once and SBA for other perturbations with LOB due to pt using BUE's on // bars to correct.   Airex balance beam + regular pad in // bars: 8x tandem walking, 8x side stepping. Pt required CGA throughout. Most difficulty with tandem walking maintaining heel to toe. Intermittent LOB requiring BUE support on bars to correct.  Star balance single  leg cone taps: Pt required CGA throughout. 5x each leg for half star. Cueing for decreased speed and increasing control. Pt experienced more difficulty tapping with L leg compared to R.   Carrying obstacle course: Pt carrying 17lb box over unstable surface (blue mat with ankle weights underneath) and stepping over half bolster. Pt demonstrated slight LOB with stepping over object. Pt progressed to weaving through 3 cones over unstable surface. Pt progressed to single leg cone taps on unstable surface. Pt required CGA throughout activity.     PT Education - 03/01/20 0901    Education Details Form/technique with exercise. STS technique.    Person(s) Educated Patient    Methods Explanation;Demonstration;Tactile cues;Verbal cues    Comprehension Verbalized understanding;Returned demonstration               PT Long Term Goals - 02/12/20 1252      PT LONG TERM GOAL #1   Title Pt will improve Berg balance score to >52/56 to demonstrate clinicially significnat decreased risk of falls.    Baseline 9/9: 40/56    Time 4    Period Weeks    Status New    Target Date 03/11/20      PT LONG TERM GOAL #2   Title Pt will improve DGI to > 22/24 to demonstrate pt is a safe community ambulator and at decreased risk of falls with ambulatory tasks.    Baseline 9/9: 15/24    Time 4    Period Weeks    Status New    Target Date 03/11/20      PT LONG TERM GOAL #3   Title Pt will be able to independently get in and out of car with no LOB and no assistance from spouse    Baseline 9/9: Pt reports difficulty getting in and out of car and requires extra time to complete.    Time 4    Period Weeks    Status New    Target Date 03/11/20      PT LONG TERM GOAL #4   Title Pt will improve normal gait speed to > 1.0 m/s with no AD and normalized gait pattern to improve safe ambulation.    Baseline 9/9: normal speed 0.83 with varying step length.    Time 4    Period Weeks    Status New    Target Date  03/11/20                 Plan - 03/01/20 0950    Clinical Impression Statement Due to anterior fall with STS at home, STS activity assessed with standard height chair. Pt reports he typically feels he is falling posteiorly with standing up form a chair. With standing pt does have decreasde ant lean and his weight is shifted on his heels. Educated on use of BUE's on handrails for balance and shifting  weight anteriorly over his toes with improvement in equal weight distribution through feet. Pt progressing with SLS activities with holding 17 lbs box alternating cone taps while standing on blue mat only requiring CGA+1 with no LOB. Pt can continue to benefit from skilled PT treatment to reduce risk of falls to improve functional mobility.    Personal Factors and Comorbidities Age;Comorbidity 3+;Past/Current Experience    Examination-Activity Limitations Dressing;Lift;Stand;Stairs;Squat;Locomotion Level    Examination-Participation Restrictions Community Activity    Stability/Clinical Decision Making Stable/Uncomplicated    Clinical Decision Making Low    Rehab Potential Good    PT Frequency 2x / week    PT Duration 4 weeks    PT Treatment/Interventions DME Instruction;Gait training;Stair training;Functional mobility training;Therapeutic activities;Therapeutic exercise;Balance training;Neuromuscular re-education;Patient/family education    PT Next Visit Plan Dual tasking, SLS    PT Home Exercise Plan Access Code: [email or web address]    Consulted and Agree with Plan of Care Patient;Family member/caregiver           Patient will benefit from skilled therapeutic intervention in order to improve the following deficits and impairments:  Abnormal gait, Improper body mechanics, Decreased mobility, Decreased range of motion, Decreased strength, Difficulty walking, Decreased balance  Visit Diagnosis: At risk for falls  Other abnormalities of gait and mobility  Muscle weakness  (generalized)     Problem List Patient Active Problem List   Diagnosis Date Noted  . CVA (cerebral vascular accident) ([place]) 07/25/2019  . Acute CVA (cerebrovascular accident) ([place])   . Hyperlipidemia   . 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 Hematochezia 12/30/2017  . Hypothyroidism 12/30/2017  . 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 Benign neoplasm of colon 08/21/2013  . CAD in native artery 08/21/2013  . Diverticulitis of colon 08/21/2013  . GERD (gastroesophageal reflux disease) 08/21/2013  . Benign essential HTN 08/21/2013  . Anal bleeding 08/21/2013  . Arthritis, degenerative 08/21/2013  . 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 Pure hypercholesterolemia 08/21/2013  . Adenomatous colon polyp 08/21/2013  . Osteoarthrosis, unspecified whether generalized or localized, pelvic region and thigh 08/21/2013   Pura Spice, PT, DPT # [id number], SPT 03/01/2020, 12:39 PM  [place] [facility] [address] [place], [place], [address] Phone: [phone]   Fax:  [phone]  Name: Warren Ingram MRN: [id number] Date of Birth: [birth year]

## 2020-03-02 ENCOUNTER — Ambulatory Visit: Payer: Medicare PPO | Admitting: Physical Therapy

## 2020-03-04 ENCOUNTER — Ambulatory Visit: Payer: Medicare PPO

## 2020-03-04 ENCOUNTER — Encounter: Payer: Self-pay | Admitting: Physical Therapy

## 2020-03-04 ENCOUNTER — Other Ambulatory Visit: Payer: Self-pay

## 2020-03-04 DIAGNOSIS — R2689 Other abnormalities of gait and mobility: Secondary | ICD-10-CM

## 2020-03-04 DIAGNOSIS — Z9181 History of falling: Secondary | ICD-10-CM

## 2020-03-04 DIAGNOSIS — M6281 Muscle weakness (generalized): Secondary | ICD-10-CM

## 2020-03-04 NOTE — Therapy (Signed)
[facility] [address]. [place], [place], [address] Phone: [phone]   Fax:  [phone]  Physical Therapy Treatment  Patient Details  Name: Warren Ingram MRN: [id number] Date of Birth: June 16, 1940 Referring Provider (PT): Vladimir Crofts, MD   Encounter Date: 03/04/2020   PT End of Session - 03/04/20 1206    Visit Number 6    Number of Visits 9    Date for PT Re-Evaluation 03/11/20    Authorization - Visit Number 6    Authorization - Number of Visits 10    PT Start Time 0901    PT Stop Time 0948    PT Time Calculation (min) 47 min    Equipment Utilized During Treatment Gait belt    Activity Tolerance Patient tolerated treatment well    Behavior During Therapy [facility] for tasks assessed/performed           Past Medical History:  Diagnosis Date  . Arthritis   . Barrett esophagus   . Benign neoplasm of abdomen   . Benign neoplasm of colon   . Cancer ([place])    skin cancer  . Coronary artery disease   . GERD (gastroesophageal reflux disease)   . Heart murmur    ASYMPTOMATIC  . History of hiatal hernia   . Hypercholesterolemia   . Hypertension   . Hypothyroidism   . Stroke [facility]) 2015   mini stroke    Past Surgical History:  Procedure Laterality Date  . CARDIAC CATHETERIZATION    . COLONOSCOPY WITH PROPOFOL N/A 01/01/2018   Procedure: COLONOSCOPY WITH PROPOFOL;  Surgeon: Jonathon Bellows, MD;  Location: [facility] ENDOSCOPY;  Service: Gastroenterology;  Laterality: N/A;  . CORONARY ARTERY BYPASS GRAFT  08/2017   4 vessels  . ESOPHAGOGASTRODUODENOSCOPY (EGD) WITH PROPOFOL N/A 10/28/2015   Procedure: ESOPHAGOGASTRODUODENOSCOPY (EGD) WITH PROPOFOL;  Surgeon: Hulen Luster, MD;  Location: [facility] ENDOSCOPY;  Service: Gastroenterology;  Laterality: N/A;  . KNEE ARTHROSCOPY    . LEFT HEART CATH AND CORONARY ANGIOGRAPHY N/A 07/25/2017   Procedure: LEFT HEART CATH AND CORONARY ANGIOGRAPHY;  Surgeon: Teodoro Spray, MD;  Location: [place] CV LAB;  Service: Cardiovascular;  Laterality: N/A;  . NASAL SINUS SURGERY    . PARTIAL KNEE ARTHROPLASTY Right 06/27/2019   Procedure: UNICOMPARTMENTAL KNEE;  Surgeon: Corky Mull, MD;  Location: [facility] ORS;  Service: Orthopedics;  Laterality: Right;  . TOTAL KNEE ARTHROPLASTY Left 12/19/2018   Procedure: TOTAL KNEE ARTHROPLASTY;  Surgeon: Corky Mull, MD;  Location: [facility] ORS;  Service: Orthopedics;  Laterality: Left;  Marland Kitchen VASECTOMY      There were no vitals filed for this visit.   Subjective Assessment - 03/04/20 1030    Subjective Pt reports continued soreness on L side from previous fall last wednesday. Pt denies pain and/or significant pain with walking and denies swelling.    Pertinent History R visual deficits, MD suspicious of possible Dementia.    Currently in Pain? No/denies    Multiple Pain Sites No           There.ex:   Nu-Step L4 for 6 min. UE/LE use. Not billed.   Neuro Re-Ed:   Standing perturbations: 5 min alternating perturbations to shoulder and hips to improve hip, ankle, and stepping strategy. Most difficulty regaining balance with lat perturbations requiring pt to side step to regain balance with intermittent UE use in / bars to correct. Able to correct indep with ant/post perturbations.   Stepping over alternating  hurdle heights (3 small, 2 tall): CGA+1, 1x10. Difficulty stepping over tall hurdles without BUE support in // bars. Compensations noted of hip circumduction.   Star cone taps (3): 3 colors. Cuing for order of colors and pt to use alternating feet to tap cones in the order colors were called out to improve dual tasking with SLS. 2x10 for bilateral feet. Increased difficulty elevating LLE compared to RLE. Beginning to tap all 3 cones on RLE without resting foot on ground to regain balance.   Dual tasking walking in hallway tossing med ball in hands working on equal step length and no lat sway. 64'x4 no difficulty. Added counting by 3's, 5's, and 7's  as dual task with noted difficulty with variable step lengths, difficulty counting accurately, and intermittent lat sway that pt was able to correct independently. CGA+1  B alternating marches with UE taps on thighs for coordination and SLS balance: 64'. Difficulty elevating B hips with marching without lat sway to L onto wall. CGA+1    PT Education - 03/04/20 1206    Education Details Form/technique with exercise.    Person(s) Educated Patient    Methods Explanation;Demonstration;Tactile cues    Comprehension Verbalized understanding;Returned demonstration               PT Long Term Goals - 02/12/20 1252      PT LONG TERM GOAL #1   Title Pt will improve Berg balance score to >52/56 to demonstrate clinicially significnat decreased risk of falls.    Baseline 9/9: 40/56    Time 4    Period Weeks    Status New    Target Date 03/11/20      PT LONG TERM GOAL #2   Title Pt will improve DGI to > 22/24 to demonstrate pt is a safe community ambulator and at decreased risk of falls with ambulatory tasks.    Baseline 9/9: 15/24    Time 4    Period Weeks    Status New    Target Date 03/11/20      PT LONG TERM GOAL #3   Title Pt will be able to independently get in and out of car with no LOB and no assistance from spouse    Baseline 9/9: Pt reports difficulty getting in and out of car and requires extra time to complete.    Time 4    Period Weeks    Status New    Target Date 03/11/20      PT LONG TERM GOAL #4   Title Pt will improve normal gait speed to > 1.0 m/s with no AD and normalized gait pattern to improve safe ambulation.    Baseline 9/9: normal speed 0.83 with varying step length.    Time 4    Period Weeks    Status New    Target Date 03/11/20                 Plan - 03/04/20 1206    Clinical Impression Statement Pt demonstrates improvements in cone taps with no LOB and less intermittent stepping. Pt is also able to tap 3 cones without bringing foot to the  ground in between taps. Difficulty with counitng by 3's and 7's while bouncing weighted med ball in hand as dual task. No LOB but noted variable step lengths and B lat sway requiring CGA. Pt can continue to benefit from skilled PT treatment to improve balance and reduce risk of falls to improve functional mobility.  Personal Factors and Comorbidities Age;Comorbidity 3+;Past/Current Experience    Examination-Activity Limitations Dressing;Lift;Stand;Stairs;Squat;Locomotion Level    Examination-Participation Restrictions Community Activity    Stability/Clinical Decision Making Stable/Uncomplicated    Rehab Potential Good    PT Frequency 2x / week    PT Duration 4 weeks    PT Treatment/Interventions DME Instruction;Gait training;Stair training;Functional mobility training;Therapeutic activities;Therapeutic exercise;Balance training;Neuromuscular re-education;Patient/family education    PT Next Visit Plan Dual tasking    PT Home Exercise Plan Access Code: [email or web address]    Consulted and Agree with Plan of Care Patient;Family member/caregiver           Patient will benefit from skilled therapeutic intervention in order to improve the following deficits and impairments:  Abnormal gait, Improper body mechanics, Decreased mobility, Decreased range of motion, Decreased strength, Difficulty walking, Decreased balance  Visit Diagnosis: At risk for falls  Other abnormalities of gait and mobility  Muscle weakness (generalized)     Problem List Patient Active Problem List   Diagnosis Date Noted  . CVA (cerebral vascular accident) ([place]) 07/25/2019  . Acute CVA (cerebrovascular accident) ([place])   . Hyperlipidemia   . 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 Hematochezia 12/30/2017  . Hypothyroidism 12/30/2017  . 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 Benign neoplasm of colon 08/21/2013  . CAD  in native artery 08/21/2013  . Diverticulitis of colon 08/21/2013  . GERD (gastroesophageal reflux disease) 08/21/2013  . Benign essential HTN 08/21/2013  . Anal bleeding 08/21/2013  . Arthritis, degenerative 08/21/2013  . 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 Pure hypercholesterolemia 08/21/2013  . Adenomatous colon polyp 08/21/2013  . Osteoarthrosis, unspecified whether generalized or localized, pelvic region and thigh 08/21/2013    Larna Daughters, SPT 03/04/2020, 12:11 PM  [facility] [address]. [place], [place], [address] Phone: [phone]   Fax:  [phone]  Name: Warren Ingram MRN: [id number] Date of Birth: 02/08/1941

## 2020-03-08 ENCOUNTER — Ambulatory Visit: Payer: Medicare PPO | Attending: Neurology | Admitting: Physical Therapy

## 2020-03-08 ENCOUNTER — Other Ambulatory Visit: Payer: Self-pay

## 2020-03-08 ENCOUNTER — Encounter: Payer: Self-pay | Admitting: Physical Therapy

## 2020-03-08 DIAGNOSIS — M6281 Muscle weakness (generalized): Secondary | ICD-10-CM | POA: Insufficient documentation

## 2020-03-08 DIAGNOSIS — R2689 Other abnormalities of gait and mobility: Secondary | ICD-10-CM | POA: Insufficient documentation

## 2020-03-08 DIAGNOSIS — Z9181 History of falling: Secondary | ICD-10-CM | POA: Insufficient documentation

## 2020-03-08 NOTE — Therapy (Signed)
[place] [facility] [address] [place], [place], [address] Phone: [phone]   Fax:  [phone]  Physical Therapy Treatment  Patient Details  Name: Warren Ingram MRN: [id number] Date of Birth: 01-29-1941 Referring Provider (PT): Vladimir Crofts, MD   Encounter Date: 03/08/2020   PT End of Session - 03/08/20 1038    Visit Number 7    Number of Visits 9    Date for PT Re-Evaluation 03/11/20    Authorization - Visit Number 7    Authorization - Number of Visits 10    PT Start Time 0867    PT Stop Time 0939    PT Time Calculation (min) 41 min    Equipment Utilized During Treatment Gait belt    Activity Tolerance Patient limited by fatigue;Treatment limited secondary to medical complications (Comment);Other (comment)   Elevated blood pressure   Behavior During Therapy WFL for tasks assessed/performed           Past Medical History:  Diagnosis Date  . Arthritis   . Barrett esophagus   . Benign neoplasm of abdomen   . Benign neoplasm of colon   . Cancer ([place])    skin cancer  . Coronary artery disease   . GERD (gastroesophageal reflux disease)   . Heart murmur    ASYMPTOMATIC  . History of hiatal hernia   . Hypercholesterolemia   . Hypertension   . Hypothyroidism   . Stroke [facility]) 2015   mini stroke    Past Surgical History:  Procedure Laterality Date  . CARDIAC CATHETERIZATION    . COLONOSCOPY WITH PROPOFOL N/A 01/01/2018   Procedure: COLONOSCOPY WITH PROPOFOL;  Surgeon: Jonathon Bellows, MD;  Location: [facility] ENDOSCOPY;  Service: Gastroenterology;  Laterality: N/A;  . CORONARY ARTERY BYPASS GRAFT  08/2017   4 vessels  . ESOPHAGOGASTRODUODENOSCOPY (EGD) WITH PROPOFOL N/A 10/28/2015   Procedure: ESOPHAGOGASTRODUODENOSCOPY (EGD) WITH PROPOFOL;  Surgeon: Hulen Luster, MD;  Location: [facility] ENDOSCOPY;  Service: Gastroenterology;  Laterality: N/A;  . KNEE ARTHROSCOPY    . LEFT HEART CATH AND CORONARY ANGIOGRAPHY N/A 07/25/2017    Procedure: LEFT HEART CATH AND CORONARY ANGIOGRAPHY;  Surgeon: Teodoro Spray, MD;  Location: [place] CV LAB;  Service: Cardiovascular;  Laterality: N/A;  . NASAL SINUS SURGERY    . PARTIAL KNEE ARTHROPLASTY Right 06/27/2019   Procedure: UNICOMPARTMENTAL KNEE;  Surgeon: Corky Mull, MD;  Location: [facility] ORS;  Service: Orthopedics;  Laterality: Right;  . TOTAL KNEE ARTHROPLASTY Left 12/19/2018   Procedure: TOTAL KNEE ARTHROPLASTY;  Surgeon: Corky Mull, MD;  Location: [facility] ORS;  Service: Orthopedics;  Laterality: Left;  Marland Kitchen VASECTOMY      There were no vitals filed for this visit.   Subjective Assessment - 03/08/20 1034    Subjective Pt reports elevated BP yesterday at the 213/103 mm Hg and has felt "under the weather". Pt reports after taking BP medication as prescribed his BP dropped to the "130's". Today he reports not taking his vitals. Prior to treatment his BP was 163/66 mm Hg.    Pertinent History R visual deficits, MD suspicious of possible Dementia.    Currently in Pain? No/denies           Vitals Prior:   BP: 163/66 mm Hg  HR: 56 BPM   SPO2: 96%  Neuro Re-Ed:   4 way resisted walking in // bars: x2 BTB. No use of UE's in // bars. Verbal cues for upright posture and not  looking at feet, to increase B step length and eccentric control. Most difficulty walking backwards with balance and limited B hip extension with noted small steps with inability to get foot past the stance foot. X5/direction. CGA+1  B 6" alternating step up with cone tap. Progressed from single UE support on handrail at stairs with good form/technique to no UE support. Verbal and tactile cues for form/technique. Decreased cone tap ability on LLE and noted compensations with R lat lean to elevate L foot high enough to tap cone. Able to correct after mod verbal and tactile cues with CGA+1 throughout.   BP taken after exercise: 194/58 mm Hg. Pt educated to take seated rest for 5 min. BP retaken after 5 min  seated rest. Dropped to 191/61 mm Hg. Pt educated on taking BP meds as prescribed by MD and signs/symptoms of a stroke and to seek immediate medical attention if any of said signs/symptoms arise. Pt also educated to continued to monitor BP at home. Pt verbalized understanding. Treatment discontinued due to elevated BP.     PT Education - 03/08/20 1036    Education Details Taking BP medicine as prescribed. Signs and symptoms of a stroke and to seek ED if any symptoms arise.    Person(s) Educated Patient    Methods Explanation;Demonstration;Tactile cues    Comprehension Verbalized understanding               PT Long Term Goals - 02/12/20 1252      PT LONG TERM GOAL #1   Title Pt will improve Berg balance score to >52/56 to demonstrate clinicially significnat decreased risk of falls.    Baseline 9/9: 40/56    Time 4    Period Weeks    Status New    Target Date 03/11/20      PT LONG TERM GOAL #2   Title Pt will improve DGI to > 22/24 to demonstrate pt is a safe community ambulator and at decreased risk of falls with ambulatory tasks.    Baseline 9/9: 15/24    Time 4    Period Weeks    Status New    Target Date 03/11/20      PT LONG TERM GOAL #3   Title Pt will be able to independently get in and out of car with no LOB and no assistance from spouse    Baseline 9/9: Pt reports difficulty getting in and out of car and requires extra time to complete.    Time 4    Period Weeks    Status New    Target Date 03/11/20      PT LONG TERM GOAL #4   Title Pt will improve normal gait speed to > 1.0 m/s with no AD and normalized gait pattern to improve safe ambulation.    Baseline 9/9: normal speed 0.83 with varying step length.    Time 4    Period Weeks    Status New    Target Date 03/11/20                 Plan - 03/08/20 1039    Clinical Impression Statement Pt limited in therapy today due to elevated BP. Prior to treatment BP was 163/66 mm Hg. After 2 dynamic balance  exercises BP was taken again. BP elevated to 194/58 mm Hg. Pt instructed by therapist to rest in chair for 5 min. After 5 min seated rest pt's BP taken again. Maintained elevated BP at 191/61 mm Hg. Therapy session  discontinued due to high BP and reports of pt not feeling well. Pt educated on signs/symptoms of stroke and to seek medical attention if any of these signs/symptoms arise. Pt also educated to check BP at home and to take BP meds as prescribed. Pt verbalized understanding.    Personal Factors and Comorbidities Age;Comorbidity 3+;Past/Current Experience    Examination-Activity Limitations Dressing;Lift;Stand;Stairs;Squat;Locomotion Level    Examination-Participation Restrictions Community Activity    Stability/Clinical Decision Making Stable/Uncomplicated    Clinical Decision Making Low    Rehab Potential Good    PT Frequency 2x / week    PT Duration 4 weeks    PT Treatment/Interventions DME Instruction;Gait training;Stair training;Functional mobility training;Therapeutic activities;Therapeutic exercise;Balance training;Neuromuscular re-education;Patient/family education    PT Next Visit Plan Reassess BP.    PT Home Exercise Plan Access Code: [email or web address]    Consulted and Agree with Plan of Care Patient;Family member/caregiver           Patient will benefit from skilled therapeutic intervention in order to improve the following deficits and impairments:  Abnormal gait, Improper body mechanics, Decreased mobility, Decreased range of motion, Decreased strength, Difficulty walking, Decreased balance  Visit Diagnosis: At risk for falls  Other abnormalities of gait and mobility  Muscle weakness (generalized)     Problem List Patient Active Problem List   Diagnosis Date Noted  . CVA (cerebral vascular accident) ([place]) 07/25/2019  . Acute CVA (cerebrovascular accident) ([place])   . Hyperlipidemia   . 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 Hematochezia  12/30/2017  . Hypothyroidism 12/30/2017  . 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 Benign neoplasm of colon 08/21/2013  . CAD in native artery 08/21/2013  . Diverticulitis of colon 08/21/2013  . GERD (gastroesophageal reflux disease) 08/21/2013  . Benign essential HTN 08/21/2013  . Anal bleeding 08/21/2013  . Arthritis, degenerative 08/21/2013  . 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 Pure hypercholesterolemia 08/21/2013  . Adenomatous colon polyp 08/21/2013  . Osteoarthrosis, unspecified whether generalized or localized, pelvic region and thigh 08/21/2013   Pura Spice, PT, DPT # [id number], SPT 03/08/2020, 11:23 AM  [place] [facility] [address] [place], [place], [address] Phone: [phone]   Fax:  [phone]  Name: Warren Ingram MRN: [id number] Date of Birth: 1940-11-01

## 2020-03-09 ENCOUNTER — Ambulatory Visit: Payer: Medicare PPO | Admitting: Physical Therapy

## 2020-03-11 ENCOUNTER — Encounter: Payer: Self-pay | Admitting: Physical Therapy

## 2020-03-11 ENCOUNTER — Ambulatory Visit: Payer: Medicare PPO | Admitting: Physical Therapy

## 2020-03-11 ENCOUNTER — Other Ambulatory Visit: Payer: Self-pay

## 2020-03-11 DIAGNOSIS — Z9181 History of falling: Secondary | ICD-10-CM

## 2020-03-11 DIAGNOSIS — M6281 Muscle weakness (generalized): Secondary | ICD-10-CM

## 2020-03-11 DIAGNOSIS — R2689 Other abnormalities of gait and mobility: Secondary | ICD-10-CM

## 2020-03-11 NOTE — Therapy (Addendum)
[facility] [address] [place], [place], [address] Phone: [phone]   Fax:  [phone]  Physical Therapy Treatment Physical Therapy Progress Note   Dates of reporting period  02/12/2020 to 03/11/2020   Patient Details  Name: Warren Ingram MRN: [id number] Date of Birth: 08-12-1940 Referring Provider (PT): Vladimir Crofts, MD   Encounter Date: 03/11/2020   PT End of Session - 03/11/20 1004    Visit Number 8    Number of Visits 17    Date for PT Re-Evaluation 04/08/20    Authorization - Visit Number 1    Authorization - Number of Visits 10    PT Start Time 0904    PT Stop Time 0939    PT Time Calculation (min) 35 min    Equipment Utilized During Treatment Gait belt    Activity Tolerance Patient limited by fatigue;Treatment limited secondary to medical complications (Comment);Other (comment)   Elevated blood pressure   Behavior During Therapy WFL for tasks assessed/performed           Past Medical History:  Diagnosis Date   Arthritis    Barrett esophagus    Benign neoplasm of abdomen    Benign neoplasm of colon    Cancer (HCC)    skin cancer   Coronary artery disease    GERD (gastroesophageal reflux disease)    Heart murmur    ASYMPTOMATIC   History of hiatal hernia    Hypercholesterolemia    Hypertension    Hypothyroidism    Stroke ([place]) 2015   mini stroke    Past Surgical History:  Procedure Laterality Date   CARDIAC CATHETERIZATION     COLONOSCOPY WITH PROPOFOL N/A 01/01/2018   Procedure: COLONOSCOPY WITH PROPOFOL;  Surgeon: Jonathon Bellows, MD;  Location: [facility] ENDOSCOPY;  Service: Gastroenterology;  Laterality: N/A;   CORONARY ARTERY BYPASS GRAFT  08/2017   4 vessels   ESOPHAGOGASTRODUODENOSCOPY (EGD) WITH PROPOFOL N/A 10/28/2015   Procedure: ESOPHAGOGASTRODUODENOSCOPY (EGD) WITH PROPOFOL;  Surgeon: Hulen Luster, MD;  Location: [facility] ENDOSCOPY;  Service: Gastroenterology;  Laterality: N/A;     KNEE ARTHROSCOPY     LEFT HEART CATH AND CORONARY ANGIOGRAPHY N/A 07/25/2017   Procedure: LEFT HEART CATH AND CORONARY ANGIOGRAPHY;  Surgeon: Teodoro Spray, MD;  Location: [place] CV LAB;  Service: Cardiovascular;  Laterality: N/A;   NASAL SINUS SURGERY     PARTIAL KNEE ARTHROPLASTY Right 06/27/2019   Procedure: UNICOMPARTMENTAL KNEE;  Surgeon: Corky Mull, MD;  Location: [facility] ORS;  Service: Orthopedics;  Laterality: Right;   TOTAL KNEE ARTHROPLASTY Left 12/19/2018   Procedure: TOTAL KNEE ARTHROPLASTY;  Surgeon: Corky Mull, MD;  Location: [facility] ORS;  Service: Orthopedics;  Laterality: Left;   VASECTOMY      There were no vitals filed for this visit.   Subjective Assessment - 03/11/20 1002    Subjective Pt reports being active over the last couple days with fertilizing the yard. Pt states his BP has "run in the 200's" once since previous session. Feels better than he did on Tuesday. Spouse is with pt today and states she plans on contacting doctor about fluctuating BP.    Pertinent History R visual deficits, MD suspicious of possible Dementia.    Currently in Pain? No/denies          Vitals Prior PT:   BP: 168/74 mm Hg  HR: 47 BPM   SPO2: 97%    Neuro Re-Ed:   Berg reassessed: 44/56.  Vitals taken after Berg balance scale due to elevated BP.   BP: 196/69 mm Hg   HR: 51 BPM:   SPO2: 96%  Due to significant elevated BP and previous reports from spouse and pt of recent BP increase, PT session ended. Education provided to pt and spouse to contact physician due to elevated BP. Pt and spouse aware of signs/symptoms of stroke. PT POC to be deferred until pt has BP regulated. PT and spouse verbalized understanding.      PT Education - 03/11/20 1004    Education Details To contact cardiologist due to significantly elevated BP while on BP meds.    Person(s) Educated Patient;Spouse    Methods Explanation    Comprehension Verbalized understanding               PT  Long Term Goals - 03/11/20 1008      PT LONG TERM GOAL #1   Title Pt will improve Berg balance score to >52/56 to demonstrate clinicially significnat decreased risk of falls.    Baseline 9/9: 40/56; 10/7: 44/56    Time 4    Period Weeks    Status Not Met    Target Date 04/08/20      PT LONG TERM GOAL #2   Title Pt will improve DGI to > 22/24 to demonstrate pt is a safe community ambulator and at decreased risk of falls with ambulatory tasks.    Baseline 9/9: 15/24; 10/7: deferred due to elevated BP    Time 4    Period Weeks    Status On-going    Target Date 04/08/20      PT LONG TERM GOAL #3   Title Pt will be able to independently get in and out of car with no LOB and no assistance from spouse    Baseline 9/9: Pt reports difficulty getting in and out of car and requires extra time to complete.; 10/7: deferred due to elevated BP    Time 4    Period Weeks    Status On-going    Target Date 04/08/20      PT LONG TERM GOAL #4   Title Pt will improve normal gait speed to > 1.0 m/s with no AD and normalized gait pattern to improve safe ambulation.    Baseline 9/9: normal speed 0.83 with varying step length.; 10/7: deferred due to elevated BP    Time 4    Period Weeks    Status On-going    Target Date 04/08/20                 Plan - 03/11/20 1021    Clinical Impression Statement Vitals reassessed prior to treatment due to previous bouts of significantly elevated BP. Prior to treatment BP was 168/74 mm Hg. Pt and spouse educated attempts on reassessing goals and after Berg balance scale, BP will be reassessed. Pt improved Berg balance to 44/56. However pt still at risk for falls although he is improving. BP after Berg balance scale was 196/69 mm Hg. Pt and spouse educated to defer rest of PT session and future PT sessions until pt able to contact physician about elevated BP. Pt educated to take BP meds as prescribed. Spouse and pt verbalized understanding. Once BP is stable,  further PT goals will be reassessed next session.    Personal Factors and Comorbidities Age;Comorbidity 3+;Past/Current Experience    Examination-Activity Limitations Dressing;Lift;Stand;Stairs;Squat;Locomotion Level    Examination-Participation Restrictions Community Activity    Stability/Clinical Decision Making Stable/Uncomplicated  Clinical Decision Making Low    Rehab Potential Good    PT Frequency 2x / week    PT Duration 4 weeks    PT Treatment/Interventions DME Instruction;Gait training;Stair training;Functional mobility training;Therapeutic activities;Therapeutic exercise;Balance training;Neuromuscular re-education;Patient/family education    PT Next Visit Plan Reassess vitals and other goals.    PT Home Exercise Plan Access Code: [email or web address]    Consulted and Agree with Plan of Care Patient;Family member/caregiver           Patient will benefit from skilled therapeutic intervention in order to improve the following deficits and impairments:  Abnormal gait, Improper body mechanics, Decreased mobility, Decreased range of motion, Decreased strength, Difficulty walking, Decreased balance  Visit Diagnosis: At risk for falls  Other abnormalities of gait and mobility  Muscle weakness (generalized)     Problem List Patient Active Problem List   Diagnosis Date Noted   CVA (cerebral vascular accident) ([place]) 07/25/2019   Acute CVA (cerebrovascular accident) ([place])    Hyperlipidemia    Status post total knee replacement using cement, 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   Hematochezia 12/30/2017   Hypothyroidism 12/30/2017   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   Benign neoplasm of colon 08/21/2013   CAD in native artery 08/21/2013   Diverticulitis of colon 08/21/2013   GERD (gastroesophageal reflux disease) 08/21/2013   Benign essential HTN 08/21/2013   Anal bleeding 08/21/2013   Arthritis, degenerative  08/21/2013   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   Pure hypercholesterolemia 08/21/2013   Adenomatous colon polyp 08/21/2013   Osteoarthrosis, unspecified whether generalized or localized, pelvic region and thigh 08/21/2013   Pura Spice, PT, DPT # 4 Larna Daughters, SPT 03/11/2020, 12:51 PM  [facility] [address] [place], [place], [address] Phone: [phone]   Fax:  [phone]  Name: MATYAS BAISLEY MRN: [id number] Date of Birth: 05-Mar-1941

## 2020-03-15 ENCOUNTER — Ambulatory Visit: Payer: Medicare PPO | Admitting: Physical Therapy

## 2020-03-16 ENCOUNTER — Ambulatory Visit: Payer: Medicare PPO | Admitting: Physical Therapy

## 2020-03-18 ENCOUNTER — Ambulatory Visit: Payer: Medicare PPO | Admitting: Physical Therapy

## 2020-03-22 ENCOUNTER — Ambulatory Visit: Payer: Medicare PPO | Admitting: Physical Therapy

## 2020-03-23 ENCOUNTER — Ambulatory Visit: Payer: Medicare PPO | Admitting: Physical Therapy

## 2020-03-25 ENCOUNTER — Other Ambulatory Visit: Payer: Self-pay

## 2020-03-25 ENCOUNTER — Encounter: Payer: Self-pay | Admitting: Physical Therapy

## 2020-03-25 ENCOUNTER — Ambulatory Visit: Payer: Medicare PPO | Admitting: Physical Therapy

## 2020-03-25 DIAGNOSIS — R2689 Other abnormalities of gait and mobility: Secondary | ICD-10-CM

## 2020-03-25 DIAGNOSIS — Z9181 History of falling: Secondary | ICD-10-CM | POA: Diagnosis not present

## 2020-03-25 DIAGNOSIS — M6281 Muscle weakness (generalized): Secondary | ICD-10-CM

## 2020-03-25 NOTE — Therapy (Signed)
[facility] [address] [place], [place], [address] Phone: [phone]   Fax:  [phone]  Physical Therapy Treatment  Patient Details  Name: Warren Ingram MRN: [id number] Date of Birth: [birth year] Referring Provider (PT): Vladimir Crofts, MD   Encounter Date: 03/25/2020   PT End of Session - 03/25/20 1542    Visit Number 9    Number of Visits 17    Date for PT Re-Evaluation 04/08/20    Authorization - Visit Number 2    Authorization - Number of Visits 10    PT Start Time 6378    PT Stop Time 1522    PT Time Calculation (min) 44 min    Equipment Utilized During Treatment Gait belt    Activity Tolerance Patient limited by fatigue;Treatment limited secondary to medical complications (Comment);Other (comment)   Elevated blood pressure   Behavior During Therapy WFL for tasks assessed/performed           Past Medical History:  Diagnosis Date  . Arthritis   . Barrett esophagus   . Benign neoplasm of abdomen   . Benign neoplasm of colon   . Cancer ([place])    skin cancer  . Coronary artery disease   . GERD (gastroesophageal reflux disease)   . Heart murmur    ASYMPTOMATIC  . History of hiatal hernia   . Hypercholesterolemia   . Hypertension   . Hypothyroidism   . Stroke [facility]) 2015   mini stroke    Past Surgical History:  Procedure Laterality Date  . CARDIAC CATHETERIZATION    . COLONOSCOPY WITH PROPOFOL N/A 01/01/2018   Procedure: COLONOSCOPY WITH PROPOFOL;  Surgeon: Jonathon Bellows, MD;  [facility]COPY;  Service: Gastroenterology;  Laterality: N/A;  . CORONARY ARTERY BYPASS GRAFT  08/2017   4 vessels  . ESOPHAGOGASTRODUODENOSCOPY (EGD) WITH PROPOFOL N/A 10/28/2015   Procedure: ESOPHAGOGASTRODUODENOSCOPY (EGD) WITH PROPOFOL;  Surgeon: Hulen Luster, MD;  Location: [facility] ENDOSCOPY;  Service: Gastroenterology;  Laterality: N/A;  . KNEE ARTHROSCOPY    . LEFT HEART CATH AND CORONARY ANGIOGRAPHY N/A 07/25/2017    Procedure: LEFT HEART CATH AND CORONARY ANGIOGRAPHY;  Surgeon: Teodoro Spray, MD;  Location: [place] CV LAB;  Service: Cardiovascular;  Laterality: N/A;  . NASAL SINUS SURGERY    . PARTIAL KNEE ARTHROPLASTY Right 06/27/2019   Procedure: UNICOMPARTMENTAL KNEE;  Surgeon: Corky Mull, MD;  Location: [facility] ORS;  Service: Orthopedics;  Laterality: Right;  . TOTAL KNEE ARTHROPLASTY Left 12/19/2018   Procedure: TOTAL KNEE ARTHROPLASTY;  Surgeon: Corky Mull, MD;  Location: [facility] ORS;  Service: Orthopedics;  Laterality: Left;  Marland Kitchen VASECTOMY      There were no vitals filed for this visit.   Subjective Assessment - 03/25/20 1540    Subjective Pt and spouse report BP has bene normalized in the afternoons. Cardiologist and PCP appointments schedueled for early November. Pt reports he has been walking better and improved balance despite not bein in PT the last 2 weeks.    Pertinent History R visual deficits, MD suspicious of possible Dementia.    Currently in Pain? No/denies            Neuro Re-Ed:   Reassessment of goals due to improved BP readings. Refer to goals and clinical impression sections of the note. Reassessed DGI, gait speed, and ability to get in/out of car with no support and LOB.  Pt and spouse educated on changing POC to afternoon due to improved  BP readings.   BP readings prior to treatment: 149/50 mm Hg BP reading after DGI: 159/55 mm Hg      PT Education - 03/25/20 1542    Education Details Future POC for afternoons.    Person(s) Educated Patient    Methods Explanation;Demonstration    Comprehension Verbalized understanding               PT Long Term Goals - 03/25/20 1547      PT LONG TERM GOAL #1   Title Pt will improve Berg balance score to >52/56 to demonstrate clinicially significnat decreased risk of falls.    Baseline 9/9: 40/56; 10/7: 44/56    Time 4    Period Weeks    Status Not Met      PT LONG TERM GOAL #2   Title Pt will improve DGI to >  22/24 to demonstrate pt is a safe community ambulator and at decreased risk of falls with ambulatory tasks.    Baseline 9/9: 15/24; 10/7: deferred due to elevated BP; 10/21: 18/24    Time 4    Period Weeks    Status Not Met    Target Date 04/08/20      PT LONG TERM GOAL #3   Title Pt will be able to independently get in and out of car with no LOB and no assistance from spouse    Baseline 9/9: Pt reports difficulty getting in and out of car and requires extra time to complete.; 10/7: deferred due to elevated BP; 10/21: Able to indep get in/out of car with no assistance, LOB, or falls. No spouse help needed.    Time 4    Period Weeks    Status Achieved    Target Date 03/25/20      PT LONG TERM GOAL #4   Title Pt will improve normal gait speed to > 1.0 m/s with no AD and normalized gait pattern to improve safe ambulation.    Baseline 9/9: normal speed 0.83 with varying step length.; 10/7: deferred due to elevated BP; 10/21: 0.857 m/s    Time 4    Period Weeks    Status Not Met    Target Date 04/08/20      PT LONG TERM GOAL #5   Title Pt will improve 30 sec STS to > 14 reps to improve BLE strength equal to age realted norms for active older adults.    Baseline 10/21: 10 reps    Time 4    Period Weeks    Status New    Target Date 04/08/20                 Plan - 03/25/20 1543    Clinical Impression Statement Pt's vitals assessed prior to further assessing pt's other goals for RECERT. BP at 149/50 mm Hg. Pt making progress towards other goals stating no LOB or issues with getting in/out of the car with no support from spouse. Pt also scored 18/24 on DGi with original score at 15/24 and gait speed to 0.857 m/s. Although pt has not met goals, he has had 2 weeks of hiatus from therapy due to unsafe elevated BP readings, pt still has made progress towards goals. Pt can continue to benefit from further skilled PT treatment to improve functional mobility and reduce risk of falls.     Personal Factors and Comorbidities Age;Comorbidity 3+;Past/Current Experience    Examination-Activity Limitations Dressing;Lift;Stand;Stairs;Squat;Locomotion Level    Examination-Participation Restrictions Community Activity    Stability/Clinical  Decision Making Stable/Uncomplicated    Clinical Decision Making Low    Rehab Potential Good    PT Frequency 2x / week    PT Duration 4 weeks    PT Treatment/Interventions DME Instruction;Gait training;Stair training;Functional mobility training;Therapeutic activities;Therapeutic exercise;Balance training;Neuromuscular re-education;Patient/family education    PT Next Visit Plan Reassess vitals and other goals.    PT Home Exercise Plan Access Code: [email or web address]    Consulted and Agree with Plan of Care Patient;Family member/caregiver           Patient will benefit from skilled therapeutic intervention in order to improve the following deficits and impairments:  Abnormal gait, Improper body mechanics, Decreased mobility, Decreased range of motion, Decreased strength, Difficulty walking, Decreased balance  Visit Diagnosis: At risk for falls  Other abnormalities of gait and mobility  Muscle weakness (generalized)     Problem List Patient Active Problem List   Diagnosis Date Noted  . CVA (cerebral vascular accident) ([place]) 07/25/2019  . Acute CVA (cerebrovascular accident) ([place])   . Hyperlipidemia   . 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 Hematochezia 12/30/2017  . Hypothyroidism 12/30/2017  . 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 Benign neoplasm of colon 08/21/2013  . CAD in native artery 08/21/2013  . Diverticulitis of colon 08/21/2013  . GERD (gastroesophageal reflux disease) 08/21/2013  . Benign essential HTN 08/21/2013  . Anal bleeding 08/21/2013  . Arthritis, degenerative 08/21/2013  . 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  Pure hypercholesterolemia 08/21/2013  . Adenomatous colon polyp 08/21/2013  . Osteoarthrosis, unspecified whether generalized or localized, pelvic region and thigh 08/21/2013   Pura Spice, PT, DPT # [id number] SPT 03/25/2020, 3:52 PM  [facility] [address] [place], [place], [address] Phone: [phone]   Fax:  [phone]  Name: Warren Ingram MRN: [id number] Date of Birth: 1940-08-27

## 2020-03-29 ENCOUNTER — Ambulatory Visit: Payer: Medicare PPO | Admitting: Physical Therapy

## 2020-03-31 ENCOUNTER — Ambulatory Visit: Payer: Medicare PPO | Admitting: Physical Therapy

## 2020-03-31 ENCOUNTER — Other Ambulatory Visit: Payer: Self-pay

## 2020-03-31 ENCOUNTER — Encounter: Payer: Self-pay | Admitting: Physical Therapy

## 2020-03-31 DIAGNOSIS — M6281 Muscle weakness (generalized): Secondary | ICD-10-CM

## 2020-03-31 DIAGNOSIS — Z9181 History of falling: Secondary | ICD-10-CM | POA: Diagnosis not present

## 2020-03-31 DIAGNOSIS — R2689 Other abnormalities of gait and mobility: Secondary | ICD-10-CM

## 2020-03-31 NOTE — Therapy (Signed)
[facility] [address] [place], [place], [address] Phone: [phone]   Fax:  [phone]  Physical Therapy Treatment  Patient Details  Name: Warren Ingram MRN: [id number] Date of Birth: [birth year] Referring Provider (PT): Vladimir Crofts, MD   Encounter Date: 03/31/2020   PT End of Session - 03/31/20 1549    Visit Number 10    Number of Visits 17    Date for PT Re-Evaluation 04/08/20    Authorization - Visit Number 3    Authorization - Number of Visits 10    PT Start Time 3428    PT Stop Time 1512    PT Time Calculation (min) 40 min    Equipment Utilized During Treatment Gait belt    Activity Tolerance Patient limited by fatigue;Treatment limited secondary to medical complications (Comment);Other (comment)   Elevated blood pressure   Behavior During Therapy WFL for tasks assessed/performed           Past Medical History:  Diagnosis Date  . Arthritis   . Barrett esophagus   . Benign neoplasm of abdomen   . Benign neoplasm of colon   . Cancer ([place])    skin cancer  . Coronary artery disease   . GERD (gastroesophageal reflux disease)   . Heart murmur    ASYMPTOMATIC  . History of hiatal hernia   . Hypercholesterolemia   . Hypertension   . Hypothyroidism   . Stroke [facility]) 2015   mini stroke    Past Surgical History:  Procedure Laterality Date  . CARDIAC CATHETERIZATION    . COLONOSCOPY WITH PROPOFOL N/A 01/01/2018   Procedure: COLONOSCOPY WITH PROPOFOL;  Surgeon: Jonathon Bellows, MD;  Location: [facility] ENDOSCOPY;  Service: Gastroenterology;  Laterality: N/A;  . CORONARY ARTERY BYPASS GRAFT  08/2017   4 vessels  . ESOPHAGOGASTRODUODENOSCOPY (EGD) WITH PROPOFOL N/A 10/28/2015   Procedure: ESOPHAGOGASTRODUODENOSCOPY (EGD) WITH PROPOFOL;  Surgeon: Hulen Luster, MD;  Location: [facility] ENDOSCOPY;  Service: Gastroenterology;  Laterality: N/A;  . KNEE ARTHROSCOPY    . LEFT HEART CATH AND CORONARY ANGIOGRAPHY N/A 07/25/2017    Procedure: LEFT HEART CATH AND CORONARY ANGIOGRAPHY;  Surgeon: Teodoro Spray, MD;  Location: [place] CV LAB;  Service: Cardiovascular;  Laterality: N/A;  . NASAL SINUS SURGERY    . PARTIAL KNEE ARTHROPLASTY Right 06/27/2019   Procedure: UNICOMPARTMENTAL KNEE;  Surgeon: Corky Mull, MD;  Location: [facility] ORS;  Service: Orthopedics;  Laterality: Right;  . TOTAL KNEE ARTHROPLASTY Left 12/19/2018   Procedure: TOTAL KNEE ARTHROPLASTY;  Surgeon: Corky Mull, MD;  Location: [facility] ORS;  Service: Orthopedics;  Laterality: Left;  Marland Kitchen VASECTOMY      There were no vitals filed for this visit.   Subjective Assessment - 03/31/20 1548    Subjective Improved BP numbers at home. Reports climbing on roof for placing new shingles with no issues. 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    Pertinent History R visual deficits, MD suspicious of possible Dementia.    Patient Stated Goals Improve gait/ balance    Currently in Pain? No/denies    Multiple Pain Sites No           Vitals prior to treatment:   BP: 118/62 mm Hg  HR: 65 BPM    Neuro Re-Ed:   Alternating hurdle step overs (small to large) in // bars to improve SLS: x8. CGA+1. Intermittent use of BUE's on bars if catching toe  on tall hurdle. VC's for leading with LLE over tall hurdles with good carryover with reduction in tripping. CGA+1  Obstacle course CGA+1: reciprocal gait over small hurdles on blue mat --> stepping over 1/2 bolster, small bolster, large bolster with dual task of having pt cone tap in between hurdle of the color cone the therapist called out --> alternating cone taps on star balance (3 cones) R/LLE x2/foot. x4 total.    Reassessed vitals in sitting:   BP: 139/65 mm Hg   HR: 62 BPM   Alternating lunges on BOSU ball in // bars for ankle stability: x6/LE. Mod verbal; and tactile cues to reduce R knee valgus. SBA+1  Backwards walking in hallway (70'): x2, mod verbal cues for larger, equal  steps. Noted occasional lat sway with larger step lengths but able to correct indep. CGA+1   PT Education - 03/31/20 1549    Education Details form/technique with balance exercises.    Person(s) Educated Patient    Methods Explanation;Demonstration    Comprehension Returned demonstration;Verbal cues required;Tactile cues required               PT Long Term Goals - 03/25/20 1547      PT LONG TERM GOAL #1   Title Pt will improve Berg balance score to >52/56 to demonstrate clinicially significnat decreased risk of falls.    Baseline 9/9: 40/56; 10/7: 44/56    Time 4    Period Weeks    Status Not Met      PT LONG TERM GOAL #2   Title Pt will improve DGI to > 22/24 to demonstrate pt is a safe community ambulator and at decreased risk of falls with ambulatory tasks.    Baseline 9/9: 15/24; 10/7: deferred due to elevated BP; 10/21: 18/24    Time 4    Period Weeks    Status Not Met    Target Date 04/08/20      PT LONG TERM GOAL #3   Title Pt will be able to independently get in and out of car with no LOB and no assistance from spouse    Baseline 9/9: Pt reports difficulty getting in and out of car and requires extra time to complete.; 10/7: deferred due to elevated BP; 10/21: Able to indep get in/out of car with no assistance, LOB, or falls. No spouse help needed.    Time 4    Period Weeks    Status Achieved    Target Date 03/25/20      PT LONG TERM GOAL #4   Title Pt will improve normal gait speed to > 1.0 m/s with no AD and normalized gait pattern to improve safe ambulation.    Baseline 9/9: normal speed 0.83 with varying step length.; 10/7: deferred due to elevated BP; 10/21: 0.857 m/s    Time 4    Period Weeks    Status Not Met    Target Date 04/08/20      PT LONG TERM GOAL #5   Title Pt will improve 30 sec STS to > 14 reps to improve BLE strength equal to age realted norms for active older adults.    Baseline 10/21: 10 reps    Time 4    Period Weeks    Status New     Target Date 04/08/20                 Plan - 03/31/20 1550    Clinical Impression Statement Pt's BP prior to session at  118/62 mm Hg. Pt progressing with dynamic balance exercises with SLS activities on unstable surfaces. Pt displayed ability to step over tall hurdles demonstrating improved hip flexion mobility and increased SLS time however did require intermittent use of UE's for LOB or tripping over hurdle. Pt maintained norml BP vitals throughout session at 139/65 mm Hg after exercise. Pt can continue to benefit from skilled PT treatment to improve functional mobility.    Personal Factors and Comorbidities Age;Comorbidity 3+;Past/Current Experience    Examination-Activity Limitations Dressing;Lift;Stand;Stairs;Squat;Locomotion Level    Examination-Participation Restrictions Community Activity    Stability/Clinical Decision Making Stable/Uncomplicated    Clinical Decision Making Low    Rehab Potential Good    PT Frequency 2x / week    PT Duration 4 weeks    PT Treatment/Interventions DME Instruction;Gait training;Stair training;Functional mobility training;Therapeutic activities;Therapeutic exercise;Balance training;Neuromuscular re-education;Patient/family education    PT Next Visit Plan Assess vitals. Dynamic balance and SLS on unstable surfaces.    PT Home Exercise Plan Access Code: [email or web address]    Consulted and Agree with Plan of Care Patient;Family member/caregiver           Patient will benefit from skilled therapeutic intervention in order to improve the following deficits and impairments:  Abnormal gait, Improper body mechanics, Decreased mobility, Decreased range of motion, Decreased strength, Difficulty walking, Decreased balance  Visit Diagnosis: At risk for falls  Other abnormalities of gait and mobility  Muscle weakness (generalized)     Problem List Patient Active Problem List   Diagnosis Date Noted  . CVA (cerebral vascular accident) ([place]) 07/25/2019  .  Acute CVA (cerebrovascular accident) ([place])   . Hyperlipidemia   . 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 Hematochezia 12/30/2017  . Hypothyroidism 12/30/2017  . 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 Benign neoplasm of colon 08/21/2013  . CAD in native artery 08/21/2013  . Diverticulitis of colon 08/21/2013  . GERD (gastroesophageal reflux disease) 08/21/2013  . Benign essential HTN 08/21/2013  . Anal bleeding 08/21/2013  . Arthritis, degenerative 08/21/2013  . 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 Pure hypercholesterolemia 08/21/2013  . Adenomatous colon polyp 08/21/2013  . Osteoarthrosis, unspecified whether generalized or localized, pelvic region and thigh 08/21/2013   Pura Spice, PT, DPT # [id number], SPT 04/01/2020, 8:04 AM  [facility] [address]. [place], [place], [address] Phone: [phone]   Fax:  [phone]  Name: FORNEY KLEINPETER MRN: [id number] Date of Birth: 1940/07/03

## 2020-04-06 ENCOUNTER — Encounter: Payer: Self-pay | Admitting: Physical Therapy

## 2020-04-06 ENCOUNTER — Other Ambulatory Visit: Payer: Self-pay

## 2020-04-06 ENCOUNTER — Ambulatory Visit: Payer: Medicare PPO | Attending: Neurology | Admitting: Physical Therapy

## 2020-04-06 DIAGNOSIS — R2689 Other abnormalities of gait and mobility: Secondary | ICD-10-CM | POA: Insufficient documentation

## 2020-04-06 DIAGNOSIS — M6281 Muscle weakness (generalized): Secondary | ICD-10-CM | POA: Diagnosis present

## 2020-04-06 DIAGNOSIS — Z9181 History of falling: Secondary | ICD-10-CM | POA: Insufficient documentation

## 2020-04-06 NOTE — Therapy (Signed)
[place] [facility] [address] [place], [place], [address] Phone: [phone]   Fax:  [phone]  Physical Therapy Treatment  Patient Details  Name: Warren Ingram MRN: [id number] Date of Birth: 09-Apr-1941 Referring Provider (PT): Vladimir Crofts, MD   Encounter Date: 04/06/2020   PT End of Session - 04/06/20 1605    Visit Number 11    Number of Visits 17    Date for PT Re-Evaluation 04/08/20    Authorization - Visit Number 4    Authorization - Number of Visits 10    PT Start Time 1600    PT Stop Time 5009    PT Time Calculation (min) 43 min    Equipment Utilized During Treatment Gait belt    Activity Tolerance Patient limited by fatigue;Treatment limited secondary to medical complications (Comment);Other (comment)   Elevated blood pressure   Behavior During Therapy WFL for tasks assessed/performed           Past Medical History:  Diagnosis Date  . Arthritis   . Barrett esophagus   . Benign neoplasm of abdomen   . Benign neoplasm of colon   . Cancer ([place])    skin cancer  . Coronary artery disease   . GERD (gastroesophageal reflux disease)   . Heart murmur    ASYMPTOMATIC  . History of hiatal hernia   . Hypercholesterolemia   . Hypertension   . Hypothyroidism   . Stroke [facility]) 2015   mini stroke    Past Surgical History:  Procedure Laterality Date  . CARDIAC CATHETERIZATION    . COLONOSCOPY WITH PROPOFOL N/A 01/01/2018   Procedure: COLONOSCOPY WITH PROPOFOL;  Surgeon: Jonathon Bellows, MD;  Location: [facility] ENDOSCOPY;  Service: Gastroenterology;  Laterality: N/A;  . CORONARY ARTERY BYPASS GRAFT  08/2017   4 vessels  . ESOPHAGOGASTRODUODENOSCOPY (EGD) WITH PROPOFOL N/A 10/28/2015   Procedure: ESOPHAGOGASTRODUODENOSCOPY (EGD) WITH PROPOFOL;  Surgeon: Hulen Luster, MD;  Location: [facility] ENDOSCOPY;  Service: Gastroenterology;  Laterality: N/A;  . KNEE ARTHROSCOPY    . LEFT HEART CATH AND CORONARY ANGIOGRAPHY N/A 07/25/2017    Procedure: LEFT HEART CATH AND CORONARY ANGIOGRAPHY;  Surgeon: Teodoro Spray, MD;  Location: [place] CV LAB;  Service: Cardiovascular;  Laterality: N/A;  . NASAL SINUS SURGERY    . PARTIAL KNEE ARTHROPLASTY Right 06/27/2019   Procedure: UNICOMPARTMENTAL KNEE;  Surgeon: Corky Mull, MD;  Location: [facility] ORS;  Service: Orthopedics;  Laterality: Right;  . TOTAL KNEE ARTHROPLASTY Left 12/19/2018   Procedure: TOTAL KNEE ARTHROPLASTY;  Surgeon: Corky Mull, MD;  Location: [facility] ORS;  Service: Orthopedics;  Laterality: Left;  Marland Kitchen VASECTOMY      There were no vitals filed for this visit.   Subjective Assessment - 04/06/20 1603    Subjective Pt. states he feels normal, however BP has been a bit on the lower side. Pt. states no falls or near falls as long as he's going slow.    Pertinent History R visual deficits, MD suspicious of possible Dementia.    Patient Stated Goals Improve gait/ balance    Currently in Pain? No/denies           There Ex:  Nustep L3 10 mins (unbilled)  Neuro re-ed:  Side stepping single leg cone taps // bars: CGA required. 3x length of bars. Pt progressed to side stepping on airex balance beam completing activity. One hand on bar 1x, progressed to no hands on // bar. Pt experienced few LOB  without use of hands on bar. BP assessment taken after activity: 163/58 mmHg  Blue mat with ankle weights underneath ambulation: pt walking over 6" hurdles with CGA. Cueing for step to pattern for 3x, pt experienced difficulty taking sep to pattern. Progressed to reciprocal step pattern over hurdles for 4x. Pt experienced less difficulty with reciprocal step pattern.   Standing on unstable surface (blue mat with ankle weights underneath) picking objects up from floor: pt required CGA, cueing for coming back to full standing between each object         PT Long Term Goals - 03/25/20 1547      PT LONG TERM GOAL #1   Title Pt will improve Berg balance score to >52/56 to  demonstrate clinicially significnat decreased risk of falls.    Baseline 9/9: 40/56; 10/7: 44/56    Time 4    Period Weeks    Status Not Met      PT LONG TERM GOAL #2   Title Pt will improve DGI to > 22/24 to demonstrate pt is a safe community ambulator and at decreased risk of falls with ambulatory tasks.    Baseline 9/9: 15/24; 10/7: deferred due to elevated BP; 10/21: 18/24    Time 4    Period Weeks    Status Not Met    Target Date 04/08/20      PT LONG TERM GOAL #3   Title Pt will be able to independently get in and out of car with no LOB and no assistance from spouse    Baseline 9/9: Pt reports difficulty getting in and out of car and requires extra time to complete.; 10/7: deferred due to elevated BP; 10/21: Able to indep get in/out of car with no assistance, LOB, or falls. No spouse help needed.    Time 4    Period Weeks    Status Achieved    Target Date 03/25/20      PT LONG TERM GOAL #4   Title Pt will improve normal gait speed to > 1.0 m/s with no AD and normalized gait pattern to improve safe ambulation.    Baseline 9/9: normal speed 0.83 with varying step length.; 10/7: deferred due to elevated BP; 10/21: 0.857 m/s    Time 4    Period Weeks    Status Not Met    Target Date 04/08/20      PT LONG TERM GOAL #5   Title Pt will improve 30 sec STS to > 14 reps to improve BLE strength equal to age realted norms for active older adults.    Baseline 10/21: 10 reps    Time 4    Period Weeks    Status New    Target Date 04/08/20                 Plan - 04/06/20 1645    Clinical Impression Statement Pt. returns to therapy with appropriate blood pressure response to therapeutic activities (163/58 mmHg). Pt. progressed SLS activities to being able to complete hurdles on unstable surface with reciprocal gait pattern. Pt. completed sidestepping single leg cone taps on airex balance beam, was biggest challenge of therapy today. Pt. will continue to benefit from skilled PT to  improve functional mobility.    Personal Factors and Comorbidities Age;Comorbidity 3+;Past/Current Experience    Examination-Activity Limitations Dressing;Lift;Stand;Stairs;Squat;Locomotion Level    Examination-Participation Restrictions Community Activity    Stability/Clinical Decision Making Stable/Uncomplicated    Clinical Decision Making Low    Rehab Potential  Good    PT Frequency 2x / week    PT Duration 4 weeks    PT Treatment/Interventions DME Instruction;Gait training;Stair training;Functional mobility training;Therapeutic activities;Therapeutic exercise;Balance training;Neuromuscular re-education;Patient/family education    PT Next Visit Plan Assess vitals. Dynamic balance and SLS on unstable surfaces.    PT Home Exercise Plan Access Code: [email or web address]    Consulted and Agree with Plan of Care Patient;Family member/caregiver           Patient will benefit from skilled therapeutic intervention in order to improve the following deficits and impairments:  Abnormal gait, Improper body mechanics, Decreased mobility, Decreased range of motion, Decreased strength, Difficulty walking, Decreased balance  Visit Diagnosis: At risk for falls  Other abnormalities of gait and mobility  Muscle weakness (generalized)     Problem List Patient Active Problem List   Diagnosis Date Noted  . CVA (cerebral vascular accident) ([place]) 07/25/2019  . Acute CVA (cerebrovascular accident) ([place])   . Hyperlipidemia   . 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 Hematochezia 12/30/2017  . Hypothyroidism 12/30/2017  . 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 Benign neoplasm of colon 08/21/2013  . CAD in native artery 08/21/2013  . Diverticulitis of colon 08/21/2013  . GERD (gastroesophageal reflux disease) 08/21/2013  . Benign essential HTN 08/21/2013  . Anal bleeding 08/21/2013  . Arthritis,  degenerative 08/21/2013  . 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 Pure hypercholesterolemia 08/21/2013  . Adenomatous colon polyp 08/21/2013  . Osteoarthrosis, unspecified whether generalized or localized, pelvic region and thigh 08/21/2013   Pura Spice, PT, DPT # [id number], SPT 04/06/2020, 5:41 PM  [place] [address]. [place], [place], [address] Phone: [phone]   Fax:  [phone]  Name: Warren Ingram MRN: [id number] Date of Birth: 04/18/1941

## 2020-04-08 ENCOUNTER — Encounter: Payer: Self-pay | Admitting: Physical Therapy

## 2020-04-08 ENCOUNTER — Other Ambulatory Visit: Payer: Self-pay

## 2020-04-08 ENCOUNTER — Ambulatory Visit: Payer: Medicare PPO

## 2020-04-08 DIAGNOSIS — R2689 Other abnormalities of gait and mobility: Secondary | ICD-10-CM

## 2020-04-08 DIAGNOSIS — Z9181 History of falling: Secondary | ICD-10-CM

## 2020-04-08 DIAGNOSIS — M6281 Muscle weakness (generalized): Secondary | ICD-10-CM

## 2020-04-08 NOTE — Therapy (Signed)
[facility] [address] [place], [place], [address] Phone: [phone]   Fax:  [phone]  Patient Details  Name: Warren Ingram MRN: [id number] Date of Birth: 04/08/1941 Referring Provider:  Vladimir Crofts, MD  Encounter Date: 04/08/2020  Pt arrived to PT with spouse. BP assessed due to recent bouts of elevation. Assessed via BP machine: 192/71 mm Hg, HR: 48 but pt asymptomatic. Sweatshirt removed and vitals taken again via BP machine after 2 min seated rest. BP: 185/78 mm Hg. HR assessed manually: 52 BPM regular regular rhythm noted. BP taken manually: 183/78 mm Hg. Due to elevated BP, PT treatment deferred. Will reassess goals and recert pt once BP has stabilized. Pt and spouse understand signs/symptoms of stroke and report seeing PCP tomorrow and will discuss BP.     Larna Daughters, SPT 04/08/2020, 4:26 PM  [place] [facility] [address] [place], [place], [address] Phone: [phone]   Fax:  [phone]

## 2020-04-13 ENCOUNTER — Other Ambulatory Visit: Payer: Self-pay

## 2020-04-13 ENCOUNTER — Encounter: Payer: Self-pay | Admitting: Physical Therapy

## 2020-04-13 ENCOUNTER — Ambulatory Visit: Payer: Medicare PPO | Admitting: Physical Therapy

## 2020-04-13 DIAGNOSIS — Z9181 History of falling: Secondary | ICD-10-CM | POA: Diagnosis not present

## 2020-04-13 DIAGNOSIS — M6281 Muscle weakness (generalized): Secondary | ICD-10-CM

## 2020-04-13 DIAGNOSIS — R2689 Other abnormalities of gait and mobility: Secondary | ICD-10-CM

## 2020-04-13 NOTE — Therapy (Signed)
[place] [facility] [address] [place], [place], [address] Phone: [phone]   Fax:  [phone]  Physical Therapy Treatment  Patient Details  Name: Warren Ingram MRN: [id number] Date of Birth: Apr 19, 1941 Referring Provider (PT): Vladimir Crofts, MD   Encounter Date: 04/13/2020   PT End of Session - 04/13/20 1515    Visit Number 11    Number of Visits 19    Date for PT Re-Evaluation 05/11/20    Authorization - Visit Number 1    Authorization - Number of Visits 10    PT Start Time 1430    PT Stop Time 9735    PT Time Calculation (min) 44 min    Equipment Utilized During Treatment Gait belt    Activity Tolerance Patient tolerated treatment well    Behavior During Therapy [facility] for tasks assessed/performed           Past Medical History:  Diagnosis Date  . Arthritis   . Barrett esophagus   . Benign neoplasm of abdomen   . Benign neoplasm of colon   . Cancer ([place])    skin cancer  . Coronary artery disease   . GERD (gastroesophageal reflux disease)   . Heart murmur    ASYMPTOMATIC  . History of hiatal hernia   . Hypercholesterolemia   . Hypertension   . Hypothyroidism   . Stroke [facility]) 2015   mini stroke    Past Surgical History:  Procedure Laterality Date  . CARDIAC CATHETERIZATION    . COLONOSCOPY WITH PROPOFOL N/A 01/01/2018   Procedure: COLONOSCOPY WITH PROPOFOL;  Surgeon: Jonathon Bellows, MD;  Location: [facility] ENDOSCOPY;  Service: Gastroenterology;  Laterality: N/A;  . CORONARY ARTERY BYPASS GRAFT  08/2017   4 vessels  . ESOPHAGOGASTRODUODENOSCOPY (EGD) WITH PROPOFOL N/A 10/28/2015   Procedure: ESOPHAGOGASTRODUODENOSCOPY (EGD) WITH PROPOFOL;  Surgeon: Hulen Luster, MD;  Location: [facility] ENDOSCOPY;  Service: Gastroenterology;  Laterality: N/A;  . KNEE ARTHROSCOPY    . LEFT HEART CATH AND CORONARY ANGIOGRAPHY N/A 07/25/2017   Procedure: LEFT HEART CATH AND CORONARY ANGIOGRAPHY;  Surgeon: Teodoro Spray, MD;  Location: [place] CV LAB;  Service: Cardiovascular;  Laterality: N/A;  . NASAL SINUS SURGERY    . PARTIAL KNEE ARTHROPLASTY Right 06/27/2019   Procedure: UNICOMPARTMENTAL KNEE;  Surgeon: Corky Mull, MD;  Location: [facility] ORS;  Service: Orthopedics;  Laterality: Right;  . TOTAL KNEE ARTHROPLASTY Left 12/19/2018   Procedure: TOTAL KNEE ARTHROPLASTY;  Surgeon: Corky Mull, MD;  Location: [facility] ORS;  Service: Orthopedics;  Laterality: Left;  Marland Kitchen VASECTOMY      There were no vitals filed for this visit.   Subjective Assessment - 04/13/20 1443    Subjective Pt reports new BP added to his medications. No falls and reports cleaning gutters on his house this morning.    Pertinent History R visual deficits, MD suspicious of possible Dementia.    Patient Stated Goals Improve gait/ balance    Currently in Pain? No/denies           BP prior to treatment:   153/ 50 mm Hg    Neuro Re-Ed:   Session spent reassessing goals while monitoring vitals. Pt improved gait speed to 1.05 m/s, Berg to 44/56, DGI to 20/24, and 30 sec STS to 11 reps. Refer to clinical impression and goals section for details.    Vitals after Berg balance and DGI:   BP: 143/47 mm Hg   Pt educated on  updated POC going forward.    PT Education - 04/13/20 1515    Education Details Future POC.    Person(s) Educated Patient    Methods Explanation;Demonstration    Comprehension Verbalized understanding;Returned demonstration               PT Long Term Goals - 04/13/20 1444      PT LONG TERM GOAL #1   Title Pt will improve Berg balance score to >52/56 to demonstrate clinicially significnat decreased risk of falls.    Baseline 9/9: 40/56; 10/7: 44/56; 11/9: 48/56    Time 4    Period Weeks    Status Not Met    Target Date 05/11/20      PT LONG TERM GOAL #2   Title Pt will improve DGI to > 22/24 to demonstrate pt is a safe community ambulator and at decreased risk of falls with ambulatory tasks.    Baseline 9/9: 15/24; 10/7:  deferred due to elevated BP; 10/21: 18/24; 11/9: 20/24    Time 4    Period Weeks    Status Not Met    Target Date 05/11/20      PT LONG TERM GOAL #3   Title Pt will be able to independently get in and out of car with no LOB and no assistance from spouse    Baseline 9/9: Pt reports difficulty getting in and out of car and requires extra time to complete.; 10/7: deferred due to elevated BP; 10/21: Able to indep get in/out of car with no assistance, LOB, or falls. No spouse help needed.    Time 4    Period Weeks    Status Achieved      PT LONG TERM GOAL #4   Title Pt will improve normal gait speed to > 1.0 m/s with no AD and normalized gait pattern to improve safe ambulation.    Baseline 9/9: normal speed 0.83 with varying step length.; 10/7: deferred due to elevated BP; 10/21: 0.857 m/s; 11/9: 1.05 m/s    Time 4    Period Weeks    Status Achieved    Target Date 05/11/20      PT LONG TERM GOAL #5   Title Pt will improve 30 sec STS to > 14 reps to improve BLE strength equal to age realted norms for active older adults.    Baseline 10/21: 10 reps; 11/9: 11 reps    Time 4    Period Weeks    Status New                 Plan - 04/13/20 1518    Clinical Impression Statement Pt progressing  in PT with goals with safe BP vitals prior to treatment: 153/58 mm Hg. Pt improved gait speed to 1.05 m/s, Berg balance scale to 48/56, and DGI to 20/24 indicating pt is at a decreased risk of falls. Pt's goals for [name] is 52/56 and DGI to 22/24 so pt is at a significant decreased risk of falls with household and community tasks. 30 sec STS has slightly improved from 10 reps to 11 with a goal of 14 reps. Pt reported fatigue in BLE's from other objective measures limiting full capacity for 30 sec STS. Pt making improvements toward goals despite having difficulty with elevated BP limiting pt's safe participation in therapy. Pt will see cardiologist on Friday and is hopefuly BP will be stabilized so he can  continue with PT consistently. Pt can contiue to benefit from skilled PT treatment  to improve functional mobility with community and hosuehold tasks and reducing risk of falls.    Personal Factors and Comorbidities Age;Comorbidity 3+;Past/Current Experience    Examination-Activity Limitations Dressing;Lift;Stand;Stairs;Squat;Locomotion Level    Examination-Participation Restrictions Community Activity    Stability/Clinical Decision Making Stable/Uncomplicated    Clinical Decision Making Low    Rehab Potential Good    PT Frequency 2x / week    PT Duration 4 weeks    PT Treatment/Interventions DME Instruction;Gait training;Stair training;Functional mobility training;Therapeutic activities;Therapeutic exercise;Balance training;Neuromuscular re-education;Patient/family education    PT Next Visit Plan Assess vitals. Dynamic balance and SLS on unstable surfaces.    PT Home Exercise Plan Access Code: [email or web address]    Consulted and Agree with Plan of Care Patient;Family member/caregiver           Patient will benefit from skilled therapeutic intervention in order to improve the following deficits and impairments:  Abnormal gait, Improper body mechanics, Decreased mobility, Decreased range of motion, Decreased strength, Difficulty walking, Decreased balance  Visit Diagnosis: At risk for falls  Other abnormalities of gait and mobility  Muscle weakness (generalized)     Problem List Patient Active Problem List   Diagnosis Date Noted  . CVA (cerebral vascular accident) ([place]) 07/25/2019  . Acute CVA (cerebrovascular accident) ([place])   . Hyperlipidemia   . 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 Hematochezia 12/30/2017  . Hypothyroidism 12/30/2017  . 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 Benign neoplasm of colon 08/21/2013  . CAD in native artery 08/21/2013  . Diverticulitis of colon  08/21/2013  . GERD (gastroesophageal reflux disease) 08/21/2013  . Benign essential HTN 08/21/2013  . Anal bleeding 08/21/2013  . Arthritis, degenerative 08/21/2013  . 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 Pure hypercholesterolemia 08/21/2013  . Adenomatous colon polyp 08/21/2013  . Osteoarthrosis, unspecified whether generalized or localized, pelvic region and thigh 08/21/2013   Pura Spice, PT, DPT # [id number] SPT 04/14/2020, 1:12 PM  [place] [facility] [address] [place], [place], [address] Phone: [phone]   Fax:  [phone]  Name: Warren Ingram MRN: [id number] Date of Birth: 1940/06/06

## 2020-04-15 ENCOUNTER — Encounter: Payer: Self-pay | Admitting: Physical Therapy

## 2020-04-15 ENCOUNTER — Ambulatory Visit: Payer: Medicare PPO | Admitting: Physical Therapy

## 2020-04-15 ENCOUNTER — Other Ambulatory Visit: Payer: Self-pay

## 2020-04-15 DIAGNOSIS — Z9181 History of falling: Secondary | ICD-10-CM | POA: Diagnosis not present

## 2020-04-15 DIAGNOSIS — R2689 Other abnormalities of gait and mobility: Secondary | ICD-10-CM

## 2020-04-15 DIAGNOSIS — M6281 Muscle weakness (generalized): Secondary | ICD-10-CM

## 2020-04-15 NOTE — Therapy (Signed)
[facility] [address] [place], [place], [address] Phone: [phone]   Fax:  [phone]  Physical Therapy Treatment  Patient Details  Name: Warren Ingram MRN: [id number] Date of Birth: Jul 31, 1940 Referring Provider (PT): Vladimir Crofts, MD   Encounter Date: 04/15/2020   PT End of Session - 04/15/20 1513    Visit Number 12    Number of Visits 19    Date for PT Re-Evaluation 05/11/20    Authorization - Visit Number 2    Authorization - Number of Visits 10    PT Start Time 4268    PT Stop Time 1514    PT Time Calculation (min) 43 min    Equipment Utilized During Treatment Gait belt    Activity Tolerance Patient tolerated treatment well    Behavior During Therapy [facility] for tasks assessed/performed           Past Medical History:  Diagnosis Date  . Arthritis   . Barrett esophagus   . Benign neoplasm of abdomen   . Benign neoplasm of colon   . Cancer ([place])    skin cancer  . Coronary artery disease   . GERD (gastroesophageal reflux disease)   . Heart murmur    ASYMPTOMATIC  . History of hiatal hernia   . Hypercholesterolemia   . Hypertension   . Hypothyroidism   . Stroke [facility]) 2015   mini stroke    Past Surgical History:  Procedure Laterality Date  . CARDIAC CATHETERIZATION    . COLONOSCOPY WITH PROPOFOL N/A 01/01/2018   Procedure: COLONOSCOPY WITH PROPOFOL;  Surgeon: Jonathon Bellows, MD;  Location: [facility] ENDOSCOPY;  Service: Gastroenterology;  Laterality: N/A;  . CORONARY ARTERY BYPASS GRAFT  08/2017   4 vessels  . ESOPHAGOGASTRODUODENOSCOPY (EGD) WITH PROPOFOL N/A 10/28/2015   Procedure: ESOPHAGOGASTRODUODENOSCOPY (EGD) WITH PROPOFOL;  Surgeon: Hulen Luster, MD;  Location: [facility] ENDOSCOPY;  Service: Gastroenterology;  Laterality: N/A;  . KNEE ARTHROSCOPY    . LEFT HEART CATH AND CORONARY ANGIOGRAPHY N/A 07/25/2017   Procedure: LEFT HEART CATH AND CORONARY ANGIOGRAPHY;  Surgeon: Teodoro Spray, MD;  Location: [place] CV LAB;  Service: Cardiovascular;  Laterality: N/A;  . NASAL SINUS SURGERY    . PARTIAL KNEE ARTHROPLASTY Right 06/27/2019   Procedure: UNICOMPARTMENTAL KNEE;  Surgeon: Corky Mull, MD;  Location: [facility] ORS;  Service: Orthopedics;  Laterality: Right;  . TOTAL KNEE ARTHROPLASTY Left 12/19/2018   Procedure: TOTAL KNEE ARTHROPLASTY;  Surgeon: Corky Mull, MD;  Location: [facility] ORS;  Service: Orthopedics;  Laterality: Left;  Marland Kitchen VASECTOMY      There were no vitals filed for this visit.   Subjective Assessment - 04/15/20 1508    Subjective Pt states his blood pressure is doing good and he sees his cardiologist on Monday. No falls reported.    Pertinent History R visual deficits, MD suspicious of possible Dementia.    Patient Stated Goals Improve gait/ balance    Currently in Pain? No/denies           BP prior: 147/64 mm Hg    There.ex:   12" step-ups: No UE support, 1x10 bilaterally. Verbal cues for form/technique CGA+1  12" side step ups: No UE support 1x8 bilaterally. Verbal cues for form/technique. CGA+1   Neuro Re-Ed:   Walking in hallway (64') with CGA+1:    x2 with vertical head turns with cuing to increase cervical ROM and gait speed with good carryover. No lat sway or  LOB    x2 vertical head turns with Dual tasking of counting by 5's. Noted decrease gait speed and lessened cervical head turns. Progressed to x2 vertical head turns dual task counting by 3's with cuing for increased speed and cervical motion to increase difficulty of task. Good carryover after cues. Slight decrease in gait speed but no LOB or lat sway.   Airex pad ball tosses with b-ball: x8 with normal BOS. x10 with feet together on airex pad with bounce passes, passes outside BOS and overhead. Most difficulty with overhead passes with x2 LOB posteriorly. CGA+1   Airex pad med ball (6 lbs) on rebounder: x30 sec normal BOS, x30 sec with narrow BOS, x30 sec with tandem stnace. CGA+1 throughout.      BP  post: 138/56 mm Hg   PT Education - 04/15/20 1513    Education Details Form/technique with exercise.    Person(s) Educated Patient    Methods Explanation;Demonstration    Comprehension Verbalized understanding;Returned demonstration               PT Long Term Goals - 04/13/20 1444      PT LONG TERM GOAL #1   Title Pt will improve Berg balance score to >52/56 to demonstrate clinicially significnat decreased risk of falls.    Baseline 9/9: 40/56; 10/7: 44/56; 11/9: 48/56    Time 4    Period Weeks    Status Not Met    Target Date 05/11/20      PT LONG TERM GOAL #2   Title Pt will improve DGI to > 22/24 to demonstrate pt is a safe community ambulator and at decreased risk of falls with ambulatory tasks.    Baseline 9/9: 15/24; 10/7: deferred due to elevated BP; 10/21: 18/24; 11/9: 20/24    Time 4    Period Weeks    Status Not Met    Target Date 05/11/20      PT LONG TERM GOAL #3   Title Pt will be able to independently get in and out of car with no LOB and no assistance from spouse    Baseline 9/9: Pt reports difficulty getting in and out of car and requires extra time to complete.; 10/7: deferred due to elevated BP; 10/21: Able to indep get in/out of car with no assistance, LOB, or falls. No spouse help needed.    Time 4    Period Weeks    Status Achieved      PT LONG TERM GOAL #4   Title Pt will improve normal gait speed to > 1.0 m/s with no AD and normalized gait pattern to improve safe ambulation.    Baseline 9/9: normal speed 0.83 with varying step length.; 10/7: deferred due to elevated BP; 10/21: 0.857 m/s; 11/9: 1.05 m/s    Time 4    Period Weeks    Status Achieved    Target Date 05/11/20      PT LONG TERM GOAL #5   Title Pt will improve 30 sec STS to > 14 reps to improve BLE strength equal to age realted norms for active older adults.    Baseline 10/21: 10 reps; 11/9: 11 reps    Time 4    Period Weeks    Status New                 Plan - 04/15/20  1514    Clinical Impression Statement Pt displaying improved SLS with ability to perform 12" step ups with no UE  support. Pt displayed most difficulty with unstable surface with overhead motions with intermittent LOB. Pt also continues to have difficulty with dual tasking and head turning with decreased gait speed and variable step lengths but no LOB. Pt can continue to benefit from skilled PT treatment to improve functional mobility.    Personal Factors and Comorbidities Age;Comorbidity 3+;Past/Current Experience    Examination-Activity Limitations Dressing;Lift;Stand;Stairs;Squat;Locomotion Level    Examination-Participation Restrictions Community Activity    Stability/Clinical Decision Making Stable/Uncomplicated    Clinical Decision Making Low    Rehab Potential Good    PT Frequency 2x / week    PT Duration 4 weeks    PT Treatment/Interventions DME Instruction;Gait training;Stair training;Functional mobility training;Therapeutic activities;Therapeutic exercise;Balance training;Neuromuscular re-education;Patient/family education    PT Next Visit Plan Assess vitals. Dynamic balance and SLS on unstable surfaces.    PT Home Exercise Plan Access Code: [email or web address]    Consulted and Agree with Plan of Care Patient;Family member/caregiver           Patient will benefit from skilled therapeutic intervention in order to improve the following deficits and impairments:  Abnormal gait, Improper body mechanics, Decreased mobility, Decreased range of motion, Decreased strength, Difficulty walking, Decreased balance  Visit Diagnosis: At risk for falls  Other abnormalities of gait and mobility  Muscle weakness (generalized)     Problem List Patient Active Problem List   Diagnosis Date Noted  . CVA (cerebral vascular accident) ([place]) 07/25/2019  . Acute CVA (cerebrovascular accident) ([place])   . Hyperlipidemia   . 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 Hematochezia  12/30/2017  . Hypothyroidism 12/30/2017  . 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 Benign neoplasm of colon 08/21/2013  . CAD in native artery 08/21/2013  . Diverticulitis of colon 08/21/2013  . GERD (gastroesophageal reflux disease) 08/21/2013  . Benign essential HTN 08/21/2013  . Anal bleeding 08/21/2013  . Arthritis, degenerative 08/21/2013  . 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 Pure hypercholesterolemia 08/21/2013  . Adenomatous colon polyp 08/21/2013  . Osteoarthrosis, unspecified whether generalized or localized, pelvic region and thigh 08/21/2013   Pura Spice, PT, DPT # [id number], SPT 04/15/2020, 5:01 PM  [facility] [address] [place], [place], [address] Phone: [phone]   Fax:  [phone]  Name: Warren Ingram MRN: [id number] Date of Birth: May 18, 1941

## 2020-04-19 ENCOUNTER — Ambulatory Visit: Payer: Medicare PPO | Admitting: Physical Therapy

## 2020-04-21 ENCOUNTER — Other Ambulatory Visit: Payer: Self-pay

## 2020-04-21 ENCOUNTER — Encounter: Payer: Self-pay | Admitting: Physical Therapy

## 2020-04-21 ENCOUNTER — Ambulatory Visit: Payer: Medicare PPO | Admitting: Physical Therapy

## 2020-04-21 DIAGNOSIS — Z9181 History of falling: Secondary | ICD-10-CM | POA: Diagnosis not present

## 2020-04-21 DIAGNOSIS — M6281 Muscle weakness (generalized): Secondary | ICD-10-CM

## 2020-04-21 DIAGNOSIS — R2689 Other abnormalities of gait and mobility: Secondary | ICD-10-CM

## 2020-04-21 NOTE — Therapy (Signed)
[facility] [address] [place], [place], [address] Phone: [phone]   Fax:  [phone]  Physical Therapy Treatment/Discharge Summary  Patient Details  Name: Warren Ingram MRN: [id number] Date of Birth: 05/04/1941 Referring Provider (PT): Shah, Hemang K, MD   Encounter Date: 04/21/2020   PT End of Session - 04/21/20 1430    Visit Number 13    Number of Visits 19    Date for PT Re-Evaluation 05/11/20    Authorization - Visit Number 3    Authorization - Number of Visits 10    PT Start Time 1345    PT Stop Time 1426    PT Time Calculation (min) 41 min    Equipment Utilized During Treatment Gait belt    Activity Tolerance Patient tolerated treatment well    Behavior During Therapy WFL for tasks assessed/performed           Past Medical History:  Diagnosis Date  . Arthritis   . Barrett esophagus   . Benign neoplasm of abdomen   . Benign neoplasm of colon   . Cancer (HCC)    skin cancer  . Coronary artery disease   . GERD (gastroesophageal reflux disease)   . Heart murmur    ASYMPTOMATIC  . History of hiatal hernia   . Hypercholesterolemia   . Hypertension   . Hypothyroidism   . Stroke (HCC) 2015   mini stroke    Past Surgical History:  Procedure Laterality Date  . CARDIAC CATHETERIZATION    . COLONOSCOPY WITH PROPOFOL N/A 01/01/2018   Procedure: COLONOSCOPY WITH PROPOFOL;  Surgeon: Anna, Kiran, MD;  Location: [facility] ENDOSCOPY;  Service: Gastroenterology;  Laterality: N/A;  . CORONARY ARTERY BYPASS GRAFT  08/2017   4 vessels  . ESOPHAGOGASTRODUODENOSCOPY (EGD) WITH PROPOFOL N/A 10/28/2015   Procedure: ESOPHAGOGASTRODUODENOSCOPY (EGD) WITH PROPOFOL;  Surgeon: Paul Y Oh, MD;  Location: [facility] ENDOSCOPY;  Service: Gastroenterology;  Laterality: N/A;  . KNEE ARTHROSCOPY    . LEFT HEART CATH AND CORONARY ANGIOGRAPHY N/A 07/25/2017   Procedure: LEFT HEART CATH AND CORONARY ANGIOGRAPHY;  Surgeon: Fath, Kenneth A,  MD;  Location: [facility] INVASIVE CV LAB;  Service: Cardiovascular;  Laterality: N/A;  . NASAL SINUS SURGERY    . PARTIAL KNEE ARTHROPLASTY Right 06/27/2019   Procedure: UNICOMPARTMENTAL KNEE;  Surgeon: Poggi, John J, MD;  Location: [facility] ORS;  Service: Orthopedics;  Laterality: Right;  . TOTAL KNEE ARTHROPLASTY Left 12/19/2018   Procedure: TOTAL KNEE ARTHROPLASTY;  Surgeon: Poggi, John J, MD;  Location: [facility] ORS;  Service: Orthopedics;  Laterality: Left;  . VASECTOMY      There were no vitals filed for this visit.   Subjective Assessment - 04/21/20 1429    Subjective Pt reports he believes he can be D/c'd from therapy and has been happy with his progress thus far in therapy. Pt has been monitoring his BP and it has been doing well.    Pertinent History R visual deficits, MD suspicious of possible Dementia.    Patient Stated Goals Improve gait/ balance    Currently in Pain? No/denies           Pt D/c'd from therapy due to pt happy with progress he has made. Goals last reassessed on 04/13/2020 with pt making progress towards goals but only accomplishing gait speed. Refer to long term goals section for details.   Vitals prior to treatment:   BP: 136/59 mm Hg  HR: 53 BPM    Neuro Re-Ed:     Hallway (64') backwards walking: x2 with verbal cuing ot increase B step lengths to challenge balance. CGA+1   Horizontal and vertical head turns with counting by 3's and 5's in hallway (64'): x2/direction. Cuing for increased cervical ROM for vertical head turns. Minor deviations in stepping but consistent gait speed noted and no LOB. CGA+1  Horizontal head turns with dual tasking reading letters on walls (64'): x2, CGA+1   Alternating marches with alternating hand taps to knees for coordination and balance:  X2, CGA+1. No LOB noted. Last 69' on second bout, incorporated horizontal head turns reading letters on the wall with noted mod gait deviations with dual task with decreased gait speed and lost  coordination.   Karaoke on wall (32'): x4, CGA+1. Progressed from 2 hands on wall for balance on first bout then no hands for remaining bouts.  Vitals Post treatment:   BP: 165/58 mm Hg  HR: 56 BPM   PT Education - 04/21/20 1429    Education Details form/technique with exercise.    Person(s) Educated Patient    Methods Explanation;Demonstration;Tactile cues;Verbal cues    Comprehension Verbalized understanding;Returned demonstration;Verbal cues required;Tactile cues required               PT Long Term Goals - 04/21/20 1436      PT LONG TERM GOAL #1   Title Pt will improve Berg balance score to >52/56 to demonstrate clinicially significnat decreased risk of falls.    Baseline 9/9: 40/56; 10/7: 44/56; 11/9: 48/56    Time 4    Period Weeks    Status Partially Met    Target Date 04/21/20      PT LONG TERM GOAL #2   Title Pt will improve DGI to > 22/24 to demonstrate pt is a safe community ambulator and at decreased risk of falls with ambulatory tasks.    Baseline 9/9: 15/24; 10/7: deferred due to elevated BP; 10/21: 18/24; 11/9: 20/24    Time 4    Period Weeks    Status Partially Met    Target Date 04/21/20      PT LONG TERM GOAL #3   Title Pt will be able to independently get in and out of car with no LOB and no assistance from spouse    Baseline 9/9: Pt reports difficulty getting in and out of car and requires extra time to complete.; 10/7: deferred due to elevated BP; 10/21: Able to indep get in/out of car with no assistance, LOB, or falls. No spouse help needed.    Time 4    Period Weeks    Status Achieved    Target Date 03/25/20      PT LONG TERM GOAL #4   Title Pt will improve normal gait speed to > 1.0 m/s with no AD and normalized gait pattern to improve safe ambulation.    Baseline 9/9: normal speed 0.83 with varying step length.; 10/7: deferred due to elevated BP; 10/21: 0.857 m/s; 11/9: 1.05 m/s    Time 4    Period Weeks    Status Achieved    Target Date  03/25/20      PT LONG TERM GOAL #5   Title Pt will improve 30 sec STS to > 14 reps to improve BLE strength equal to age realted norms for active older adults.    Baseline 10/21: 10 reps; 11/9: 11 reps    Time 4    Period Weeks    Status Partially Met    Target Date  04/21/20                 Plan - 04/21/20 1432    Clinical Impression Statement Pt pleased with his progress in therapy and believes he is comfortable along with his spouse, being D/C'd from therapy. Pt's BP has been improving to safe levels prior to therapy and pt has been able to participate fully. Goals recently updated on 11/9 and overall pt has progressed significantly in therapy. Refer to long term goasl section for details. Treatment today focusing on dynamic balance with dual tasking with no LOB noted throughout and improved maintenance of gait speed with dual tasking with no variable step lengths. Pt and spouse educate to contact PT office if they have any future questions, concerns or need for future therapy.    Personal Factors and Comorbidities Age;Comorbidity 3+;Past/Current Experience    Examination-Activity Limitations Dressing;Lift;Stand;Stairs;Squat;Locomotion Level    Examination-Participation Restrictions Community Activity    Stability/Clinical Decision Making Stable/Uncomplicated    Rehab Potential Good    PT Frequency 2x / week    PT Duration 4 weeks    PT Treatment/Interventions DME Instruction;Gait training;Stair training;Functional mobility training;Therapeutic activities;Therapeutic exercise;Balance training;Neuromuscular re-education;Patient/family education    PT Next Visit Plan D/c'd from therapy.    PT Home Exercise Plan Access Code: [email or web address]    Consulted and Agree with Plan of Care Patient;Family member/caregiver           Patient will benefit from skilled therapeutic intervention in order to improve the following deficits and impairments:  Abnormal gait, Improper body mechanics, Decreased  mobility, Decreased range of motion, Decreased strength, Difficulty walking, Decreased balance  Visit Diagnosis: At risk for falls  Other abnormalities of gait and mobility  Muscle weakness (generalized)     Problem List Patient Active Problem List   Diagnosis Date Noted  . CVA (cerebral vascular accident) (HCC) 07/25/2019  . Acute CVA (cerebrovascular accident) (HCC)   . Hyperlipidemia   . 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 Hematochezia 12/30/2017  . Hypothyroidism 12/30/2017  . 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 Benign neoplasm of colon 08/21/2013  . CAD in native artery 08/21/2013  . Diverticulitis of colon 08/21/2013  . GERD (gastroesophageal reflux disease) 08/21/2013  . Benign essential HTN 08/21/2013  . Anal bleeding 08/21/2013  . Arthritis, degenerative 08/21/2013  . 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 Pure hypercholesterolemia 08/21/2013  . Adenomatous colon polyp 08/21/2013  . Osteoarthrosis, unspecified whether generalized or localized, pelvic region and thigh 08/21/2013   Michael C Sherk, PT, DPT # [id number] Milton Fairly, SPT  04/21/2020, 3:05 PM  [facility] [facility] [address] [place], [place], [address] Phone: [phone]   Fax:  [phone]  Name: Warren Ingram MRN: [id number] Date of Birth: 05/11/1941   

## 2020-04-26 ENCOUNTER — Ambulatory Visit: Payer: Medicare PPO | Admitting: Physical Therapy

## 2020-04-28 ENCOUNTER — Ambulatory Visit: Payer: Medicare PPO | Admitting: Physical Therapy

## 2020-05-03 ENCOUNTER — Ambulatory Visit: Payer: Medicare PPO | Admitting: Physical Therapy

## 2020-05-05 ENCOUNTER — Ambulatory Visit: Payer: Medicare PPO | Admitting: Physical Therapy

## 2020-05-10 ENCOUNTER — Ambulatory Visit: Payer: Medicare PPO | Admitting: Physical Therapy

## 2020-05-12 ENCOUNTER — Ambulatory Visit: Payer: Medicare PPO | Admitting: Physical Therapy

## 2020-07-02 DIAGNOSIS — H903 Sensorineural hearing loss, bilateral: Secondary | ICD-10-CM | POA: Diagnosis not present

## 2020-07-30 DIAGNOSIS — R7309 Other abnormal glucose: Secondary | ICD-10-CM | POA: Diagnosis not present

## 2020-07-30 DIAGNOSIS — Z79899 Other long term (current) drug therapy: Secondary | ICD-10-CM | POA: Diagnosis not present

## 2020-07-30 DIAGNOSIS — I10 Essential (primary) hypertension: Secondary | ICD-10-CM | POA: Diagnosis not present

## 2020-07-30 DIAGNOSIS — E7800 Pure hypercholesterolemia, unspecified: Secondary | ICD-10-CM | POA: Diagnosis not present

## 2020-07-30 DIAGNOSIS — E039 Hypothyroidism, unspecified: Secondary | ICD-10-CM | POA: Diagnosis not present

## 2020-08-06 DIAGNOSIS — E039 Hypothyroidism, unspecified: Secondary | ICD-10-CM | POA: Diagnosis not present

## 2020-08-06 DIAGNOSIS — Z0000 Encounter for general adult medical examination without abnormal findings: Secondary | ICD-10-CM | POA: Diagnosis not present

## 2020-08-06 DIAGNOSIS — E785 Hyperlipidemia, unspecified: Secondary | ICD-10-CM | POA: Diagnosis not present

## 2020-08-06 DIAGNOSIS — R7309 Other abnormal glucose: Secondary | ICD-10-CM | POA: Diagnosis not present

## 2020-08-06 DIAGNOSIS — I2510 Atherosclerotic heart disease of native coronary artery without angina pectoris: Secondary | ICD-10-CM | POA: Diagnosis not present

## 2020-08-06 DIAGNOSIS — Z125 Encounter for screening for malignant neoplasm of prostate: Secondary | ICD-10-CM | POA: Diagnosis not present

## 2020-08-06 DIAGNOSIS — Z79899 Other long term (current) drug therapy: Secondary | ICD-10-CM | POA: Diagnosis not present

## 2020-08-06 DIAGNOSIS — I10 Essential (primary) hypertension: Secondary | ICD-10-CM | POA: Diagnosis not present

## 2020-08-11 DIAGNOSIS — D2262 Melanocytic nevi of left upper limb, including shoulder: Secondary | ICD-10-CM | POA: Diagnosis not present

## 2020-08-11 DIAGNOSIS — D2272 Melanocytic nevi of left lower limb, including hip: Secondary | ICD-10-CM | POA: Diagnosis not present

## 2020-08-11 DIAGNOSIS — Z85828 Personal history of other malignant neoplasm of skin: Secondary | ICD-10-CM | POA: Diagnosis not present

## 2020-08-11 DIAGNOSIS — D225 Melanocytic nevi of trunk: Secondary | ICD-10-CM | POA: Diagnosis not present

## 2020-08-11 DIAGNOSIS — C44319 Basal cell carcinoma of skin of other parts of face: Secondary | ICD-10-CM | POA: Diagnosis not present

## 2020-08-11 DIAGNOSIS — L400 Psoriasis vulgaris: Secondary | ICD-10-CM | POA: Diagnosis not present

## 2020-08-11 DIAGNOSIS — D2271 Melanocytic nevi of right lower limb, including hip: Secondary | ICD-10-CM | POA: Diagnosis not present

## 2020-08-11 DIAGNOSIS — L570 Actinic keratosis: Secondary | ICD-10-CM | POA: Diagnosis not present

## 2020-08-11 DIAGNOSIS — D485 Neoplasm of uncertain behavior of skin: Secondary | ICD-10-CM | POA: Diagnosis not present

## 2020-08-11 DIAGNOSIS — L821 Other seborrheic keratosis: Secondary | ICD-10-CM | POA: Diagnosis not present

## 2020-09-23 DIAGNOSIS — C44319 Basal cell carcinoma of skin of other parts of face: Secondary | ICD-10-CM | POA: Diagnosis not present

## 2020-11-17 DIAGNOSIS — S46102A Unspecified injury of muscle, fascia and tendon of long head of biceps, left arm, initial encounter: Secondary | ICD-10-CM | POA: Diagnosis not present

## 2020-11-17 DIAGNOSIS — M7582 Other shoulder lesions, left shoulder: Secondary | ICD-10-CM | POA: Diagnosis not present

## 2020-11-17 DIAGNOSIS — M75122 Complete rotator cuff tear or rupture of left shoulder, not specified as traumatic: Secondary | ICD-10-CM | POA: Diagnosis not present

## 2021-01-14 DIAGNOSIS — H2513 Age-related nuclear cataract, bilateral: Secondary | ICD-10-CM | POA: Diagnosis not present

## 2021-01-14 DIAGNOSIS — Z0100 Encounter for examination of eyes and vision without abnormal findings: Secondary | ICD-10-CM | POA: Diagnosis not present

## 2021-01-20 DIAGNOSIS — Z0100 Encounter for examination of eyes and vision without abnormal findings: Secondary | ICD-10-CM | POA: Diagnosis not present

## 2021-01-21 DIAGNOSIS — E7800 Pure hypercholesterolemia, unspecified: Secondary | ICD-10-CM | POA: Diagnosis not present

## 2021-01-21 DIAGNOSIS — I10 Essential (primary) hypertension: Secondary | ICD-10-CM | POA: Diagnosis not present

## 2021-01-21 DIAGNOSIS — E669 Obesity, unspecified: Secondary | ICD-10-CM | POA: Diagnosis not present

## 2021-01-21 DIAGNOSIS — I639 Cerebral infarction, unspecified: Secondary | ICD-10-CM | POA: Diagnosis not present

## 2021-01-21 DIAGNOSIS — I2510 Atherosclerotic heart disease of native coronary artery without angina pectoris: Secondary | ICD-10-CM | POA: Diagnosis not present

## 2021-02-02 DIAGNOSIS — Z79899 Other long term (current) drug therapy: Secondary | ICD-10-CM | POA: Diagnosis not present

## 2021-02-02 DIAGNOSIS — I10 Essential (primary) hypertension: Secondary | ICD-10-CM | POA: Diagnosis not present

## 2021-02-02 DIAGNOSIS — L570 Actinic keratosis: Secondary | ICD-10-CM | POA: Diagnosis not present

## 2021-02-02 DIAGNOSIS — D2261 Melanocytic nevi of right upper limb, including shoulder: Secondary | ICD-10-CM | POA: Diagnosis not present

## 2021-02-02 DIAGNOSIS — D2272 Melanocytic nevi of left lower limb, including hip: Secondary | ICD-10-CM | POA: Diagnosis not present

## 2021-02-02 DIAGNOSIS — L400 Psoriasis vulgaris: Secondary | ICD-10-CM | POA: Diagnosis not present

## 2021-02-02 DIAGNOSIS — X32XXXA Exposure to sunlight, initial encounter: Secondary | ICD-10-CM | POA: Diagnosis not present

## 2021-02-02 DIAGNOSIS — E7800 Pure hypercholesterolemia, unspecified: Secondary | ICD-10-CM | POA: Diagnosis not present

## 2021-02-02 DIAGNOSIS — D2262 Melanocytic nevi of left upper limb, including shoulder: Secondary | ICD-10-CM | POA: Diagnosis not present

## 2021-02-02 DIAGNOSIS — Z85828 Personal history of other malignant neoplasm of skin: Secondary | ICD-10-CM | POA: Diagnosis not present

## 2021-02-02 DIAGNOSIS — Z125 Encounter for screening for malignant neoplasm of prostate: Secondary | ICD-10-CM | POA: Diagnosis not present

## 2021-02-02 DIAGNOSIS — R7309 Other abnormal glucose: Secondary | ICD-10-CM | POA: Diagnosis not present

## 2021-02-09 DIAGNOSIS — I2510 Atherosclerotic heart disease of native coronary artery without angina pectoris: Secondary | ICD-10-CM | POA: Diagnosis not present

## 2021-02-09 DIAGNOSIS — E039 Hypothyroidism, unspecified: Secondary | ICD-10-CM | POA: Diagnosis not present

## 2021-02-09 DIAGNOSIS — I10 Essential (primary) hypertension: Secondary | ICD-10-CM | POA: Diagnosis not present

## 2021-02-09 DIAGNOSIS — Z79899 Other long term (current) drug therapy: Secondary | ICD-10-CM | POA: Diagnosis not present

## 2021-02-09 DIAGNOSIS — E7800 Pure hypercholesterolemia, unspecified: Secondary | ICD-10-CM | POA: Diagnosis not present

## 2021-02-09 DIAGNOSIS — R7309 Other abnormal glucose: Secondary | ICD-10-CM | POA: Diagnosis not present

## 2021-02-09 DIAGNOSIS — G8929 Other chronic pain: Secondary | ICD-10-CM | POA: Diagnosis not present

## 2021-02-09 DIAGNOSIS — R519 Headache, unspecified: Secondary | ICD-10-CM | POA: Diagnosis not present

## 2021-03-08 DIAGNOSIS — R2689 Other abnormalities of gait and mobility: Secondary | ICD-10-CM | POA: Diagnosis not present

## 2021-03-08 DIAGNOSIS — R519 Headache, unspecified: Secondary | ICD-10-CM | POA: Diagnosis not present

## 2021-03-08 DIAGNOSIS — R4189 Other symptoms and signs involving cognitive functions and awareness: Secondary | ICD-10-CM | POA: Diagnosis not present

## 2021-03-08 DIAGNOSIS — E559 Vitamin D deficiency, unspecified: Secondary | ICD-10-CM | POA: Diagnosis not present

## 2021-03-08 DIAGNOSIS — I69398 Other sequelae of cerebral infarction: Secondary | ICD-10-CM | POA: Diagnosis not present

## 2021-03-08 DIAGNOSIS — C719 Malignant neoplasm of brain, unspecified: Secondary | ICD-10-CM | POA: Diagnosis not present

## 2021-03-11 DIAGNOSIS — R519 Headache, unspecified: Secondary | ICD-10-CM | POA: Diagnosis not present

## 2021-03-11 DIAGNOSIS — R4189 Other symptoms and signs involving cognitive functions and awareness: Secondary | ICD-10-CM | POA: Diagnosis not present

## 2021-03-11 DIAGNOSIS — C719 Malignant neoplasm of brain, unspecified: Secondary | ICD-10-CM | POA: Diagnosis not present

## 2021-03-11 DIAGNOSIS — E559 Vitamin D deficiency, unspecified: Secondary | ICD-10-CM | POA: Diagnosis not present

## 2021-03-11 DIAGNOSIS — R2689 Other abnormalities of gait and mobility: Secondary | ICD-10-CM | POA: Diagnosis not present

## 2021-03-11 DIAGNOSIS — I69398 Other sequelae of cerebral infarction: Secondary | ICD-10-CM | POA: Diagnosis not present

## 2021-04-04 DIAGNOSIS — G8929 Other chronic pain: Secondary | ICD-10-CM | POA: Diagnosis not present

## 2021-04-04 DIAGNOSIS — R519 Headache, unspecified: Secondary | ICD-10-CM | POA: Diagnosis not present

## 2021-04-04 DIAGNOSIS — E538 Deficiency of other specified B group vitamins: Secondary | ICD-10-CM | POA: Diagnosis not present

## 2021-04-04 DIAGNOSIS — I6789 Other cerebrovascular disease: Secondary | ICD-10-CM | POA: Diagnosis not present

## 2021-04-04 DIAGNOSIS — I6782 Cerebral ischemia: Secondary | ICD-10-CM | POA: Diagnosis not present

## 2021-04-11 DIAGNOSIS — E538 Deficiency of other specified B group vitamins: Secondary | ICD-10-CM | POA: Diagnosis not present

## 2021-04-18 DIAGNOSIS — E538 Deficiency of other specified B group vitamins: Secondary | ICD-10-CM | POA: Diagnosis not present

## 2021-04-25 DIAGNOSIS — E538 Deficiency of other specified B group vitamins: Secondary | ICD-10-CM | POA: Diagnosis not present

## 2021-06-15 ENCOUNTER — Other Ambulatory Visit: Payer: Self-pay | Admitting: Surgery

## 2021-06-15 DIAGNOSIS — S46102D Unspecified injury of muscle, fascia and tendon of long head of biceps, left arm, subsequent encounter: Secondary | ICD-10-CM | POA: Diagnosis not present

## 2021-06-15 DIAGNOSIS — M7582 Other shoulder lesions, left shoulder: Secondary | ICD-10-CM

## 2021-06-15 DIAGNOSIS — M75122 Complete rotator cuff tear or rupture of left shoulder, not specified as traumatic: Secondary | ICD-10-CM

## 2021-06-15 DIAGNOSIS — M25512 Pain in left shoulder: Secondary | ICD-10-CM | POA: Diagnosis not present

## 2021-06-28 ENCOUNTER — Ambulatory Visit
Admission: RE | Admit: 2021-06-28 | Discharge: 2021-06-28 | Disposition: A | Discharge status: Home / Self Care | Payer: Medicare HMO | Source: Ambulatory Visit | Attending: Surgery | Admitting: Surgery

## 2021-06-28 DIAGNOSIS — M7582 Other shoulder lesions, left shoulder: Secondary | ICD-10-CM | POA: Insufficient documentation

## 2021-06-28 DIAGNOSIS — M75122 Complete rotator cuff tear or rupture of left shoulder, not specified as traumatic: Secondary | ICD-10-CM | POA: Diagnosis not present

## 2021-06-28 DIAGNOSIS — S46102D Unspecified injury of muscle, fascia and tendon of long head of biceps, left arm, subsequent encounter: Secondary | ICD-10-CM | POA: Insufficient documentation

## 2021-08-04 DIAGNOSIS — Z79899 Other long term (current) drug therapy: Secondary | ICD-10-CM | POA: Diagnosis not present

## 2021-08-04 DIAGNOSIS — I10 Essential (primary) hypertension: Secondary | ICD-10-CM | POA: Diagnosis not present

## 2021-08-04 DIAGNOSIS — R7309 Other abnormal glucose: Secondary | ICD-10-CM | POA: Diagnosis not present

## 2021-08-04 DIAGNOSIS — I2510 Atherosclerotic heart disease of native coronary artery without angina pectoris: Secondary | ICD-10-CM | POA: Diagnosis not present

## 2021-08-04 DIAGNOSIS — E7800 Pure hypercholesterolemia, unspecified: Secondary | ICD-10-CM | POA: Diagnosis not present

## 2021-08-04 DIAGNOSIS — E039 Hypothyroidism, unspecified: Secondary | ICD-10-CM | POA: Diagnosis not present

## 2021-08-15 DIAGNOSIS — R69 Illness, unspecified: Secondary | ICD-10-CM | POA: Diagnosis not present

## 2021-08-19 DIAGNOSIS — R739 Hyperglycemia, unspecified: Secondary | ICD-10-CM | POA: Diagnosis not present

## 2021-08-19 DIAGNOSIS — I2510 Atherosclerotic heart disease of native coronary artery without angina pectoris: Secondary | ICD-10-CM | POA: Diagnosis not present

## 2021-08-19 DIAGNOSIS — E079 Disorder of thyroid, unspecified: Secondary | ICD-10-CM | POA: Diagnosis not present

## 2021-08-19 DIAGNOSIS — R0989 Other specified symptoms and signs involving the circulatory and respiratory systems: Secondary | ICD-10-CM | POA: Diagnosis not present

## 2021-08-19 DIAGNOSIS — E785 Hyperlipidemia, unspecified: Secondary | ICD-10-CM | POA: Diagnosis not present

## 2021-08-19 DIAGNOSIS — Z0000 Encounter for general adult medical examination without abnormal findings: Secondary | ICD-10-CM | POA: Diagnosis not present

## 2021-08-19 DIAGNOSIS — I10 Essential (primary) hypertension: Secondary | ICD-10-CM | POA: Diagnosis not present

## 2021-10-03 DIAGNOSIS — D2261 Melanocytic nevi of right upper limb, including shoulder: Secondary | ICD-10-CM | POA: Diagnosis not present

## 2021-10-03 DIAGNOSIS — L570 Actinic keratosis: Secondary | ICD-10-CM | POA: Diagnosis not present

## 2021-10-03 DIAGNOSIS — D2272 Melanocytic nevi of left lower limb, including hip: Secondary | ICD-10-CM | POA: Diagnosis not present

## 2021-10-03 DIAGNOSIS — D2271 Melanocytic nevi of right lower limb, including hip: Secondary | ICD-10-CM | POA: Diagnosis not present

## 2021-10-03 DIAGNOSIS — C44311 Basal cell carcinoma of skin of nose: Secondary | ICD-10-CM | POA: Diagnosis not present

## 2021-10-03 DIAGNOSIS — D485 Neoplasm of uncertain behavior of skin: Secondary | ICD-10-CM | POA: Diagnosis not present

## 2021-10-03 DIAGNOSIS — X32XXXA Exposure to sunlight, initial encounter: Secondary | ICD-10-CM | POA: Diagnosis not present

## 2021-10-03 DIAGNOSIS — Z85828 Personal history of other malignant neoplasm of skin: Secondary | ICD-10-CM | POA: Diagnosis not present

## 2021-10-03 DIAGNOSIS — C44519 Basal cell carcinoma of skin of other part of trunk: Secondary | ICD-10-CM | POA: Diagnosis not present

## 2021-10-03 DIAGNOSIS — D2262 Melanocytic nevi of left upper limb, including shoulder: Secondary | ICD-10-CM | POA: Diagnosis not present

## 2021-10-05 DIAGNOSIS — E785 Hyperlipidemia, unspecified: Secondary | ICD-10-CM | POA: Diagnosis not present

## 2021-10-05 DIAGNOSIS — I10 Essential (primary) hypertension: Secondary | ICD-10-CM | POA: Diagnosis not present

## 2021-10-05 DIAGNOSIS — R0989 Other specified symptoms and signs involving the circulatory and respiratory systems: Secondary | ICD-10-CM | POA: Diagnosis not present

## 2021-10-05 DIAGNOSIS — Z8673 Personal history of transient ischemic attack (TIA), and cerebral infarction without residual deficits: Secondary | ICD-10-CM | POA: Diagnosis not present

## 2021-10-09 DIAGNOSIS — I6529 Occlusion and stenosis of unspecified carotid artery: Secondary | ICD-10-CM | POA: Insufficient documentation

## 2021-10-09 NOTE — Progress Notes (Signed)
? ? ? ? ?MRN : [id number] ? ?Warren Ingram is a 81 y.o. (November 05, 1940) male who presents with chief complaint of check carotid arteries. ? ?History of Present Illness:  ? ?The patient is seen for evaluation of carotid stenosis. The carotid stenosis was identified after a duplex ultrasound was obtained 10/05/2021. ? ?The patient denies no recent amaurosis fugax. There is no recent history of TIA symptoms or focal motor deficits. There is a prior documented CVA with right eye visual changes. ? ?There is no history of migraine headaches. There is no history of seizures. ? ?The patient is taking enteric-coated aspirin 81 mg daily. ? ?No recent shortening of the patient's walking distance or new symptoms consistent with claudication.  No history of rest pain symptoms. No new ulcers or wounds of the lower extremities have occurred. ? ?There is no history of DVT, PE or superficial thrombophlebitis. ?No recent episodes of angina or shortness of breath documented.   ? ?Duplex ultrasound (10/05/2021) shows RICA >75% and LICA =10% ? ?No outpatient medications have been marked as taking for the 10/10/21 encounter (Appointment) with Delana Meyer, Dolores Lory, MD.  ? ? ?Past Medical History:  ?Diagnosis Date  ? Arthritis   ? Barrett esophagus   ? Benign neoplasm of abdomen   ? Benign neoplasm of colon   ? Cancer [facility])   ? skin cancer  ? Coronary artery disease   ? GERD (gastroesophageal reflux disease)   ? Heart murmur   ? ASYMPTOMATIC  ? History of hiatal hernia   ? Hypercholesterolemia   ? Hypertension   ? Hypothyroidism   ? Stroke [facility]) 2015  ? mini stroke  ? ? ?Past Surgical History:  ?Procedure Laterality Date  ? CARDIAC CATHETERIZATION    ? COLONOSCOPY WITH PROPOFOL N/A 01/01/2018  ? Procedure: COLONOSCOPY WITH PROPOFOL;  Surgeon: Jonathon Bellows, MD;  Location: [facility] END[facility]COPY;  Service: Gastroenterology;  Laterality: N/A;  ? CORONARY ARTERY BYPASS GRAFT  08/2017  ? 4 vessels  ? ESOPHAGOGASTRODUODENOSCOPY (EGD) WITH PROPOFOL N/A  10/28/2015  ? Procedure: ESOPHAGOGASTRODUODENOSCOPY (EGD) WITH PROPOFOL;  Surgeon: Hulen Luster, MD;  Location: [facility] ENDOSCOPY;  Service: Gastroenterology;  Laterality: N/A;  ? KNEE ARTHROSCOPY    ? LEFT HEART CATH AND CORONARY ANGIOGRAPHY N/A 07/25/2017  ? Procedure: LEFT HEART CATH AND CORONARY ANGIOGRAPHY;  Surgeon: Teodoro Spray, MD;  Location: [place] CV LAB;  Service: Cardiovascular;  Laterality: N/A;  ? NASAL SINUS SURGERY    ? PARTIAL KNEE ARTHROPLASTY Right 06/27/2019  ? Procedure: UNICOMPARTMENTAL KNEE;  Surgeon: Corky Mull, MD;  Location: [facility] ORS;  Service: Orthopedics;  Laterality: Right;  ? TOTAL KNEE ARTHROPLASTY Left 12/19/2018  ? Procedure: TOTAL KNEE ARTHROPLASTY;  Surgeon: Corky Mull, MD;  Location: [facility] ORS;  Service: Orthopedics;  Laterality: Left;  ? VASECTOMY    ? ? ?Social History ?Social History  ? ?Tobacco Use  ? Smoking status: Former  ?  Packs/day: 1.00  ?  Years: 15.00  ?  Pack years: 15.00  ?  Types: Cigarettes  ?  Quit date: 06/06/1971  ?  Years since quitting: 50.3  ? Smokeless tobacco: Former  ?Vaping Use  ? Vaping Use: Never used  ?Substance Use Topics  ? Alcohol use: Yes  ?  Alcohol/week: 1.0 standard drink  ?  Types: 1 Cans of beer per week  ?  Comment: 4 to 5 times per week  ? Drug use: No  ? ? ?Family History ?Family History  ?Problem Relation Age  of Onset  ? Heart attack Mother   ? Lung disease Father   ? ? ?No Known Allergies ? ? ?REVIEW OF SYSTEMS (Negative unless checked) ? ?Constitutional: '[]'$ Weight loss  '[]'$ Fever  '[]'$ Chills ?Cardiac: '[]'$ Chest pain   '[]'$ Chest pressure   '[]'$ Palpitations   '[]'$ Shortness of breath when laying flat   '[]'$ Shortness of breath with exertion. ?Vascular:  '[x]'$ Pain in legs with walking   '[]'$ Pain in legs at rest  '[]'$ History of DVT   '[]'$ Phlebitis   '[]'$ Swelling in legs   '[]'$ Varicose veins   '[]'$ Non-healing ulcers ?Pulmonary:   '[]'$ Uses home oxygen   '[]'$ Productive cough   '[]'$ Hemoptysis   '[]'$ Wheeze  '[]'$ COPD   '[]'$ Asthma ?Neurologic:  '[]'$ Dizziness   '[]'$ Seizures   '[]'$ History of  stroke   '[]'$ History of TIA  '[]'$ Aphasia   '[]'$ Vissual changes   '[]'$ Weakness or numbness in arm   '[]'$ Weakness or numbness in leg ?Musculoskeletal:   '[]'$ Joint swelling   '[x]'$ Joint pain   '[]'$ Low back pain ?Hematologic:  '[]'$ Easy bruising  '[]'$ Easy bleeding   '[]'$ Hypercoagulable state   '[]'$ Anemic ?Gastrointestinal:  '[]'$ Diarrhea   '[]'$ Vomiting  '[x]'$ Gastroesophageal reflux/heartburn   '[]'$ Difficulty swallowing. ?Genitourinary:  '[]'$ Chronic kidney disease   '[]'$ Difficult urination  '[]'$ Frequent urination   '[]'$ Blood in urine ?Skin:  '[]'$ Rashes   '[]'$ Ulcers  ?Psychological:  '[]'$ History of anxiety   '[]'$  History of major depression. ? ?Physical Examination ? ?There were no vitals filed for this visit. ?There is no height or weight on file to calculate BMI. ?Gen: WD/WN, NAD ?Head: [place]/AT, No temporalis wasting.  ?Ear/Nose/Throat: Hearing grossly intact, nares w/o erythema or drainage ?Eyes: PER, EOMI, sclera nonicteric.  ?Neck: Supple, no masses.  No bruit or JVD.  ?Pulmonary:  Good air movement, no audible wheezing, no use of accessory muscles.  ?Cardiac: RRR, normal S1, S2, no Murmurs. ?Vascular:  carotid bruit noted ?Vessel Right Left  ?Radial Palpable Palpable  ?Carotid  Palpable  Palpable  ?Subclav  Palpable Palpable  ?Gastrointestinal: soft, non-distended. No guarding/no peritoneal signs.  ?Musculoskeletal: M/S 5/5 throughout.  No visible deformity.  ?Neurologic: CN 2-12 intact. Pain and light touch intact in extremities.  Symmetrical.  Speech is fluent. Motor exam as listed above. ?Psychiatric: Judgment intact, Mood & affect appropriate for pt's clinical situation. ?Dermatologic: No rashes or ulcers noted.  No changes consistent with cellulitis. ? ? ?CBC ?Lab Results  ?Component Value Date  ? WBC 7.3 07/25/2019  ? HGB 14.1 07/25/2019  ? HCT 43.2 07/25/2019  ? MCV 85.2 07/25/2019  ? PLT 306 07/25/2019  ? ? ?BMET ?   ?Component Value Date/Time  ? NA 139 07/25/2019 1117  ? K 4.1 07/25/2019 1117  ? CL 99 07/25/2019 1117  ? CO2 26 07/25/2019 1117  ? GLUCOSE 106  (H) 07/25/2019 1117  ? BUN 13 07/25/2019 1117  ? CREATININE 0.79 07/25/2019 1117  ? CALCIUM 9.5 07/25/2019 1117  ? GFRNONAA >60 07/25/2019 1117  ? GFRAA >60 07/25/2019 1117  ? ?CrCl cannot be calculated (Patient's most recent lab result is older than the maximum 21 days allowed.). ? ?COAG ?Lab Results  ?Component Value Date  ? INR 1.0 07/25/2019  ? INR 0.97 12/30/2017  ? ? ?Radiology ?No results found. ? ? ?Assessment/Plan ?1. Bilateral carotid artery stenosis ?The patient remains asymptomatic with respect to the carotid stenosis.  However, the patient has now progressed and has a lesion the is >70%. ? ?Patient should undergo CT angiography of the carotid arteries to define the degree of stenosis of the internal carotid arteries bilaterally and the  anatomic suitability for surgery vs. intervention. ? ?If the patient does indeed need surgery cardiac clearance will be required, once cleared the patient will be scheduled for surgery. ? ?The risks, benefits and alternative therapies were reviewed in detail with the patient.  All questions were answered.  The patient agrees to proceed with imaging. ? ?Continue antiplatelet therapy as prescribed. ?Continue management of CAD, HTN and Hyperlipidemia. ?Healthy heart diet, encouraged exercise at least 4 times per week.   ?- CT ANGIO NECK W OR WO CONTRAST; Future ? ?2. CAD in native artery ?Continue cardiac and antihypertensive medications as already ordered and reviewed, no changes at this time. ? ?Continue statin as ordered and reviewed, no changes at this time ? ?Nitrates PRN for chest pain  ? ?3. Benign essential HTN ?Continue antihypertensive medications as already ordered, these medications have been reviewed and there are no changes at this time.  ? ?4. Gastroesophageal reflux disease without esophagitis ?Continue PPI as already ordered, this medication has been reviewed and there are no changes at this time. ? ?Avoidence of caffeine and alcohol ? ?Moderate elevation of  the head of the bed   ? ?5. Pure hypercholesterolemia ?Continue statin as ordered and reviewed, no changes at this time  ? ? ? ?Hortencia Pilar, MD ? ?10/09/2021 ?4:44 PM ? ?  ?

## 2021-10-10 ENCOUNTER — Encounter (INDEPENDENT_AMBULATORY_CARE_PROVIDER_SITE_OTHER): Payer: Self-pay | Admitting: Vascular Surgery

## 2021-10-10 ENCOUNTER — Ambulatory Visit (INDEPENDENT_AMBULATORY_CARE_PROVIDER_SITE_OTHER): Payer: Medicare HMO | Admitting: Vascular Surgery

## 2021-10-10 VITALS — BP 137/79 | HR 60 | Resp 16 | Ht 64.0 in | Wt 174.0 lb

## 2021-10-10 DIAGNOSIS — K219 Gastro-esophageal reflux disease without esophagitis: Secondary | ICD-10-CM

## 2021-10-10 DIAGNOSIS — I6523 Occlusion and stenosis of bilateral carotid arteries: Secondary | ICD-10-CM

## 2021-10-10 DIAGNOSIS — I2510 Atherosclerotic heart disease of native coronary artery without angina pectoris: Secondary | ICD-10-CM | POA: Diagnosis not present

## 2021-10-10 DIAGNOSIS — I10 Essential (primary) hypertension: Secondary | ICD-10-CM | POA: Diagnosis not present

## 2021-10-10 DIAGNOSIS — E7800 Pure hypercholesterolemia, unspecified: Secondary | ICD-10-CM | POA: Diagnosis not present

## 2021-10-17 ENCOUNTER — Telehealth (INDEPENDENT_AMBULATORY_CARE_PROVIDER_SITE_OTHER): Payer: Self-pay | Admitting: Vascular Surgery

## 2021-10-17 NOTE — Telephone Encounter (Signed)
Spoke with pt - gave # to scheduling - he will call and make an appt for CT and then call us back to make a CT results appt with Dr. Delana Meyer.  ?

## 2021-10-18 ENCOUNTER — Ambulatory Visit
Admission: RE | Admit: 2021-10-18 | Discharge: 2021-10-18 | Disposition: A | Discharge status: Home / Self Care | Payer: Medicare HMO | Source: Ambulatory Visit | Attending: Vascular Surgery | Admitting: Vascular Surgery

## 2021-10-18 DIAGNOSIS — I6523 Occlusion and stenosis of bilateral carotid arteries: Secondary | ICD-10-CM | POA: Diagnosis not present

## 2021-10-18 DIAGNOSIS — I6503 Occlusion and stenosis of bilateral vertebral arteries: Secondary | ICD-10-CM | POA: Diagnosis not present

## 2021-10-18 LAB — POCT I-STAT CREATININE: Creatinine, Ser: 1 mg/dL (ref 0.61–1.24)

## 2021-10-18 MED ORDER — IOHEXOL 350 MG/ML SOLN
75.0000 mL | Freq: Once | INTRAVENOUS | Status: AC | PRN
  Administered 2021-10-18: 75 mL via INTRAVENOUS

## 2021-10-24 ENCOUNTER — Ambulatory Visit (INDEPENDENT_AMBULATORY_CARE_PROVIDER_SITE_OTHER): Payer: Medicare HMO | Admitting: Vascular Surgery

## 2021-10-24 ENCOUNTER — Encounter (INDEPENDENT_AMBULATORY_CARE_PROVIDER_SITE_OTHER): Payer: Self-pay | Admitting: Vascular Surgery

## 2021-10-24 VITALS — BP 147/76 | HR 60 | Resp 16 | Ht 64.0 in | Wt 174.0 lb

## 2021-10-24 DIAGNOSIS — I6523 Occlusion and stenosis of bilateral carotid arteries: Secondary | ICD-10-CM | POA: Diagnosis not present

## 2021-10-24 DIAGNOSIS — I10 Essential (primary) hypertension: Secondary | ICD-10-CM | POA: Diagnosis not present

## 2021-10-24 DIAGNOSIS — C44519 Basal cell carcinoma of skin of other part of trunk: Secondary | ICD-10-CM | POA: Diagnosis not present

## 2021-10-24 DIAGNOSIS — E782 Mixed hyperlipidemia: Secondary | ICD-10-CM

## 2021-10-24 DIAGNOSIS — I2510 Atherosclerotic heart disease of native coronary artery without angina pectoris: Secondary | ICD-10-CM | POA: Diagnosis not present

## 2021-10-29 ENCOUNTER — Encounter (INDEPENDENT_AMBULATORY_CARE_PROVIDER_SITE_OTHER): Payer: Self-pay | Admitting: Vascular Surgery

## 2021-10-29 NOTE — Progress Notes (Signed)
MRN : [id number]  Warren Ingram is a 81 y.o. ([birth year]) male who presents with chief complaint of check carotid arteries.  History of Present Illness:   The patient is seen for follow up evaluation of carotid stenosis status post CT angiogram. CT scan was done 10/19/2021. Patient reports that the test went well with no problems or complications.   The patient denies interval amaurosis fugax. There is no recent or interval TIA symptoms or focal motor deficits. There is no prior documented CVA.  The patient is taking enteric-coated aspirin 81 mg daily.  There is no history of migraine headaches. There is no history of seizures.  No recent shortening of the patient's walking distance or new symptoms consistent with claudication.  No history of rest pain symptoms. No new ulcers or wounds of the lower extremities have occurred.  There is no history of DVT, PE or superficial thrombophlebitis. No recent episodes of angina or shortness of breath documented.   CT angiogram is reviewed by me personally and shows >90% stenosis consistent with calcified plaque at the origin of the left internal carotid artery.    Current Meds  Medication Sig   amLODipine (NORVASC) 5 MG tablet Take 5 mg by mouth daily.   aspirin 81 MG EC tablet Take 325 mg by mouth daily.    atorvastatin (LIPITOR) 80 MG tablet Take 80 mg by mouth at bedtime.    clopidogrel (PLAVIX) 75 MG tablet Take 1 tablet (75 mg total) by mouth daily.   furosemide (LASIX) 40 MG tablet Take 1 tablet by mouth daily.   ibuprofen (ADVIL) 800 MG tablet Take 800 mg by mouth every 6 (six) hours as needed.   levothyroxine (SYNTHROID, LEVOTHROID) 150 MCG tablet Take 150 mcg by mouth daily before breakfast.   lisinopril (ZESTRIL) 10 MG tablet Take 10 mg by mouth daily.   metoprolol tartrate (LOPRESSOR) 25 MG tablet Take 25 mg by mouth 2 (two) times daily.    Omega-3 Fatty Acids (FISH OIL) 1200 MG CAPS Take 1,200 mg by mouth 2 (two) times  a day.    omeprazole (PRILOSEC) 20 MG capsule Take 20 mg by mouth every morning.    telmisartan (MICARDIS) 40 MG tablet Take 40 mg by mouth every morning.     Past Medical History:  Diagnosis Date   Arthritis    Barrett esophagus    Benign neoplasm of abdomen    Benign neoplasm of colon    Cancer (HCC)    skin cancer   Coronary artery disease    GERD (gastroesophageal reflux disease)    Heart murmur    ASYMPTOMATIC   History of hiatal hernia    Hypercholesterolemia    Hypertension    Hypothyroidism    Stroke [facility]) 2015   mini stroke    Past Surgical History:  Procedure Laterality Date   CARDIAC CATHETERIZATION     COLONOSCOPY WITH PROPOFOL N/A 01/01/2018   Procedure: COLONOSCOPY WITH PROPOFOL;  Surgeon: Jonathon Bellows, MD;  Location: [facility] ENDOSCOPY;  Service: Gastroenterology;  Laterality: N/A;   CORONARY ARTERY BYPASS GRAFT  08/2017   4 vessels   ESOPHAGOGASTRODUODENOSCOPY (EGD) WITH PROPOFOL N/A 10/28/2015   Procedure: ESOPHAGOGASTRODUODENOSCOPY (EGD) WITH PROPOFOL;  Surgeon: Hulen Luster, MD;  Location: [facility] ENDOSCOPY;  Service: Gastroenterology;  Laterality: N/A;   KNEE ARTHROSCOPY     LEFT HEART CATH AND CORONARY ANGIOGRAPHY N/A 07/25/2017   Procedure: LEFT HEART CATH AND CORONARY ANGIOGRAPHY;  Surgeon: Teodoro Spray, MD;  Location: [facility];  Service: Cardiovascular;  Laterality: N/A;   NASAL SINUS SURGERY     PARTIAL KNEE ARTHROPLASTY Right 06/27/2019   Procedure: UNICOMPARTMENTAL KNEE;  Surgeon: Corky Mull, MD;  Location: [facility] ORS;  Service: Orthopedics;  Laterality: Right;   TOTAL KNEE ARTHROPLASTY Left 12/19/2018   Procedure: TOTAL KNEE ARTHROPLASTY;  Surgeon: Corky Mull, MD;  Location: [facility] ORS;  Service: Orthopedics;  Laterality: Left;   VASECTOMY      Social History Social History   Tobacco Use   Smoking status: Former    Packs/day: 1.00    Years: 15.00    Pack years: 15.00    Types: Cigarettes    Quit date: 06/06/1971    Years since  quitting: 50.4   Smokeless tobacco: Former  Scientific laboratory technician Use: Never used  Substance Use Topics   Alcohol use: Yes    Alcohol/week: 1.0 standard drink    Types: 1 Cans of beer per week    Comment: 4 to 5 times per week   Drug use: No    Family History Family History  Problem Relation Age of Onset   Heart attack Mother    Lung disease Father     No Known Allergies   REVIEW OF SYSTEMS (Negative unless checked)  Constitutional: '[]'$ Weight loss  '[]'$ Fever  '[]'$ Chills Cardiac: '[]'$ Chest pain   '[]'$ Chest pressure   '[]'$ Palpitations   '[]'$ Shortness of breath when laying flat   '[]'$ Shortness of breath with exertion. Vascular:  '[x]'$ Pain in legs with walking   '[]'$ Pain in legs at rest  '[]'$ History of DVT   '[]'$ Phlebitis   '[]'$ Swelling in legs   '[]'$ Varicose veins   '[]'$ Non-healing ulcers Pulmonary:   '[]'$ Uses home oxygen   '[]'$ Productive cough   '[]'$ Hemoptysis   '[]'$ Wheeze  '[]'$ COPD   '[]'$ Asthma Neurologic:  '[]'$ Dizziness   '[]'$ Seizures   '[]'$ History of stroke   '[]'$ History of TIA  '[]'$ Aphasia   '[]'$ Vissual changes   '[]'$ Weakness or numbness in arm   '[]'$ Weakness or numbness in leg Musculoskeletal:   '[]'$ Joint swelling   '[]'$ Joint pain   '[]'$ Low back pain Hematologic:  '[]'$ Easy bruising  '[]'$ Easy bleeding   '[]'$ Hypercoagulable state   '[]'$ Anemic Gastrointestinal:  '[]'$ Diarrhea   '[]'$ Vomiting  '[]'$ Gastroesophageal reflux/heartburn   '[]'$ Difficulty swallowing. Genitourinary:  '[]'$ Chronic kidney disease   '[]'$ Difficult urination  '[]'$ Frequent urination   '[]'$ Blood in urine Skin:  '[]'$ Rashes   '[]'$ Ulcers  Psychological:  '[]'$ History of anxiety   '[]'$  History of major depression.  Physical Examination  Vitals:   10/24/21 1141  BP: (!) 147/76  Pulse: 60  Resp: 16  Weight: 174 lb (78.9 kg)  Height: '5\' 4"'$  (1.626 m)   Body mass index is 29.87 kg/m. Gen: WD/WN, NAD Head: [place]/AT, No temporalis wasting.  Ear/Nose/Throat: Hearing grossly intact, nares w/o erythema or drainage Eyes: PER, EOMI, sclera nonicteric.  Neck: Supple, no masses.  No bruit or JVD.  Pulmonary:  Good  air movement, no audible wheezing, no use of accessory muscles.  Cardiac: RRR, normal S1, S2, no Murmurs. Vascular:  carotid bruit noted Vessel Right Left  Radial Palpable Palpable  Carotid  Palpable  Palpable  Subclav  Palpable Palpable  Gastrointestinal: soft, non-distended. No guarding/no peritoneal signs.  Musculoskeletal: M/S 5/5 throughout.  No visible deformity.  Neurologic: CN 2-12 intact. Pain and light touch intact in extremities.  Symmetrical.  Speech is fluent. Motor exam as listed above. Psychiatric: Judgment intact, Mood & affect appropriate for pt's clinical situation. Dermatologic: No rashes or ulcers  noted.  No changes consistent with cellulitis.   CBC Lab Results  Component Value Date   WBC 7.3 07/25/2019   HGB 14.1 07/25/2019   HCT 43.2 07/25/2019   MCV 85.2 07/25/2019   PLT 306 07/25/2019    BMET    Component Value Date/Time   NA 139 07/25/2019 1117   K 4.1 07/25/2019 1117   CL 99 07/25/2019 1117   CO2 26 07/25/2019 1117   GLUCOSE 106 (H) 07/25/2019 1117   BUN 13 07/25/2019 1117   CREATININE 1.00 10/18/2021 0933   CALCIUM 9.5 07/25/2019 1117   GFRNONAA >60 07/25/2019 1117   GFRAA >60 07/25/2019 1117   Estimated Creatinine Clearance: 56.8 mL/min (by C-G formula based on SCr of 1 mg/dL).  COAG Lab Results  Component Value Date   INR 1.0 07/25/2019   INR 0.97 12/30/2017    Radiology CT ANGIO NECK W OR WO CONTRAST  Addendum Date: 10/19/2021   ADDENDUM REPORT: 10/19/2021 10:58 ADDENDUM: These results will be called to the ordering clinician or representative by the Radiologist Assistant, and communication documented in the PACS or [employer]. Electronically Signed   By: Genevie Ann M.D.   On: 10/19/2021 10:58   Result Date: 10/19/2021 CLINICAL DATA:  81 year old male with symptomatic carotid stenosis. EXAM: CT ANGIOGRAPHY NECK TECHNIQUE: Multidetector CT imaging of the neck was performed using the standard protocol during bolus administration of  intravenous contrast. Multiplanar CT image reconstructions and MIPs were obtained to evaluate the vascular anatomy. Carotid stenosis measurements (when applicable) are obtained utilizing NASCET criteria, using the distal internal carotid diameter as the denominator. RADIATION DOSE REDUCTION: This exam was performed according to the departmental dose-optimization program which includes automated exposure control, adjustment of the mA and/or kV according to patient size and/or use of iterative reconstruction technique. CONTRAST:  68m OMNIPAQUE IOHEXOL 350 MG/ML SOLN COMPARISON:  CTA head and neck 07/25/2019. FINDINGS: Skeleton: Prior sternotomy. 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 Indistinct sclerotic area in the left T2 body appears stable since 2021 and most likely benign. No acute osseous abnormality identified. Upper chest: Prior CABG. Visible central pulmonary arteries are patent. No superior mediastinal lymphadenopathy. Mild dependent atelectasis in both lungs. Other neck: No acute finding. Grossly stable visible brain parenchyma. Aortic arch: Calcified aortic atherosclerosis. Three vessel arch configuration. Tortuous proximal great vessels. Right carotid system: Mild brachiocephalic artery plaque and tortuosity without stenosis. No right CCA stenosis. Bulky soft more than calcified plaque at the right ICA origin does appear mildly progressed since 2021 (series 8, image 32) and high-grade, stenosis now numerically estimated up to 80 % with respect to the distal vessel. The right ICA remains patent to the skull base without additional stenosis. Visible right ICA siphon is patent with calcified plaque but no significant stenosis. Left carotid system: Tortuous left CCA origin with mild plaque. Kinked appearance at the thoracic inlet but no significant left CCA stenosis before the bifurcation. Progressed mostly soft plaque  throughout the left ICA origin and bulb now with radiographic string sign stenosis of the left ICA (series 6, image 104) along a segment of about 10 mm (series 8, image 97). Despite this the vessel remains patent to the skull base. Visible left ICA siphon is patent with only mild calcified plaque. Vertebral arteries: Mild plaque at the proximal right subclavian artery origin without stenosis. Normal right vertebral artery origin. Right vertebral artery is patent to the vertebrobasilar junction with no significant plaque or  stenosis. Right PICA origin is patent. Left subclavian artery origin plaque with stenosis estimated at 60 % with respect to the distal vessel (series 7, image 103). Highly diminutive left vertebral artery redemonstrated, only faint and thread-like through the V1 and V2 segments. Chronic non visualization of the vessel in the proximal V3 segment with stable reconstitution at the skull base. Left V4 remains thread-like but patent to the vertebrobasilar junction. 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 Visible basilar artery appears stable. Anatomic variants: Fetal type bilateral PCA origins demonstrated in 2021. Review of the MIP images confirms the above findings IMPRESSION: 1. Progressed bulky soft plaque at the Left ICA origin and bulb since 2021 now with RADIOGRAPHIC STRING SIGN STENOSIS along a 10 mm segment of the vessel (series 6, image 104). The Left ICA remains patent. 2. Lesser progression of chronic hemodynamically significant stenosis of the Right ICA origin due to soft plaque, now numerically estimated up to 80% (series 8, image 32 versus previously 70%). 3. Unchanged vertebral arteries: Dominant Right Vertebral Artery with no significant stenosis. 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 And grossly stable Basilar Artery  atherosclerosis and stenosis in the setting of bilateral fetal PCA origins. 4. Aortic Atherosclerosis (ICD10-I70.0). Left subclavian artery origin stenosis estimated at 60% with respect to the distal vessel. Electronically Signed: By: Genevie Ann M.D. On: 10/19/2021 10:30     Assessment/Plan 1. Symptomatic stenosis of both carotid arteries Recommend:  The patient is symptomatic with respect to the carotid stenosis.  The patient now has progressed and has a lesion the is >90%.  Patient's CT angiography of the carotid arteries confirms >90% left ICA stenosis.  The anatomical considerations support stenting over surgery.  This was discussed in detail with the patient.  The risks, benefits and alternative therapies were reviewed in detail with the patient.  All questions were answered.  The patient agrees to proceed with stenting of the left carotid artery.  Continue antiplatelet therapy as prescribed. Continue management of CAD, HTN and Hyperlipidemia. Healthy heart diet, encouraged exercise at least 4 times per week.    2. CAD in native artery Continue cardiac and antihypertensive medications as already ordered and reviewed, no changes at this time.  Continue statin as ordered and reviewed, no changes at this time  Nitrates PRN for chest pain   3. Benign essential HTN Continue antihypertensive medications as already ordered, these medications have been reviewed and there are no changes at this time.   4. Mixed hyperlipidemia Continue statin as ordered and reviewed, no changes at this time     Hortencia Pilar, MD  10/29/2021 4:00 PM

## 2021-10-29 NOTE — H&P (View-Only) (Signed)
MRN : [id number]  Warren Ingram is a 81 y.o. ([birth year]) male who presents with chief complaint of check carotid arteries.  History of Present Illness:   The patient is seen for follow up evaluation of carotid stenosis status post CT angiogram. CT scan was done 10/19/2021. Patient reports that the test went well with no problems or complications.   The patient denies interval amaurosis fugax. There is no recent or interval TIA symptoms or focal motor deficits. There is no prior documented CVA.  The patient is taking enteric-coated aspirin 81 mg daily.  There is no history of migraine headaches. There is no history of seizures.  No recent shortening of the patient's walking distance or new symptoms consistent with claudication.  No history of rest pain symptoms. No new ulcers or wounds of the lower extremities have occurred.  There is no history of DVT, PE or superficial thrombophlebitis. No recent episodes of angina or shortness of breath documented.   CT angiogram is reviewed by me personally and shows >90% stenosis consistent with calcified plaque at the origin of the left internal carotid artery.    Current Meds  Medication Sig   amLODipine (NORVASC) 5 MG tablet Take 5 mg by mouth daily.   aspirin 81 MG EC tablet Take 325 mg by mouth daily.    atorvastatin (LIPITOR) 80 MG tablet Take 80 mg by mouth at bedtime.    clopidogrel (PLAVIX) 75 MG tablet Take 1 tablet (75 mg total) by mouth daily.   furosemide (LASIX) 40 MG tablet Take 1 tablet by mouth daily.   ibuprofen (ADVIL) 800 MG tablet Take 800 mg by mouth every 6 (six) hours as needed.   levothyroxine (SYNTHROID, LEVOTHROID) 150 MCG tablet Take 150 mcg by mouth daily before breakfast.   lisinopril (ZESTRIL) 10 MG tablet Take 10 mg by mouth daily.   metoprolol tartrate (LOPRESSOR) 25 MG tablet Take 25 mg by mouth 2 (two) times daily.    Omega-3 Fatty Acids (FISH OIL) 1200 MG CAPS Take 1,200 mg by mouth 2 (two) times  a day.    omeprazole (PRILOSEC) 20 MG capsule Take 20 mg by mouth every morning.    telmisartan (MICARDIS) 40 MG tablet Take 40 mg by mouth every morning.     Past Medical History:  Diagnosis Date   Arthritis    Barrett esophagus    Benign neoplasm of abdomen    Benign neoplasm of colon    Cancer (HCC)    skin cancer   Coronary artery disease    GERD (gastroesophageal reflux disease)    Heart murmur    ASYMPTOMATIC   History of hiatal hernia    Hypercholesterolemia    Hypertension    Hypothyroidism    Stroke [facility]) 2015   mini stroke    Past Surgical History:  Procedure Laterality Date   CARDIAC CATHETERIZATION     COLONOSCOPY WITH PROPOFOL N/A 01/01/2018   Procedure: COLONOSCOPY WITH PROPOFOL;  Surgeon: Jonathon Bellows, MD;  Location: [facility] ENDOSCOPY;  Service: Gastroenterology;  Laterality: N/A;   CORONARY ARTERY BYPASS GRAFT  08/2017   4 vessels   ESOPHAGOGASTRODUODENOSCOPY (EGD) WITH PROPOFOL N/A 10/28/2015   Procedure: ESOPHAGOGASTRODUODENOSCOPY (EGD) WITH PROPOFOL;  Surgeon: Hulen Luster, MD;  Location: [facility] ENDOSCOPY;  Service: Gastroenterology;  Laterality: N/A;   KNEE ARTHROSCOPY     LEFT HEART CATH AND CORONARY ANGIOGRAPHY N/A 07/25/2017   Procedure: LEFT HEART CATH AND CORONARY ANGIOGRAPHY;  Surgeon: Teodoro Spray, MD;  Location: [place] CV LAB;  Service: Cardiovascular;  Laterality: N/A;   NASAL SINUS SURGERY     PARTIAL KNEE ARTHROPLASTY Right 06/27/2019   Procedure: UNICOMPARTMENTAL KNEE;  Surgeon: Corky Mull, MD;  Location: [facility] ORS;  Service: Orthopedics;  Laterality: Right;   TOTAL KNEE ARTHROPLASTY Left 12/19/2018   Procedure: TOTAL KNEE ARTHROPLASTY;  Surgeon: Corky Mull, MD;  Location: [facility] ORS;  Service: Orthopedics;  Laterality: Left;   VASECTOMY      Social History Social History   Tobacco Use   Smoking status: Former    Packs/day: 1.00    Years: 15.00    Pack years: 15.00    Types: Cigarettes    Quit date: 06/06/1971    Years since  quitting: 50.4   Smokeless tobacco: Former  Scientific laboratory technician Use: Never used  Substance Use Topics   Alcohol use: Yes    Alcohol/week: 1.0 standard drink    Types: 1 Cans of beer per week    Comment: 4 to 5 times per week   Drug use: No    Family History Family History  Problem Relation Age of Onset   Heart attack Mother    Lung disease Father     No Known Allergies   REVIEW OF SYSTEMS (Negative unless checked)  Constitutional: '[]'$ Weight loss  '[]'$ Fever  '[]'$ Chills Cardiac: '[]'$ Chest pain   '[]'$ Chest pressure   '[]'$ Palpitations   '[]'$ Shortness of breath when laying flat   '[]'$ Shortness of breath with exertion. Vascular:  '[x]'$ Pain in legs with walking   '[]'$ Pain in legs at rest  '[]'$ History of DVT   '[]'$ Phlebitis   '[]'$ Swelling in legs   '[]'$ Varicose veins   '[]'$ Non-healing ulcers Pulmonary:   '[]'$ Uses home oxygen   '[]'$ Productive cough   '[]'$ Hemoptysis   '[]'$ Wheeze  '[]'$ COPD   '[]'$ Asthma Neurologic:  '[]'$ Dizziness   '[]'$ Seizures   '[]'$ History of stroke   '[]'$ History of TIA  '[]'$ Aphasia   '[]'$ Vissual changes   '[]'$ Weakness or numbness in arm   '[]'$ Weakness or numbness in leg Musculoskeletal:   '[]'$ Joint swelling   '[]'$ Joint pain   '[]'$ Low back pain Hematologic:  '[]'$ Easy bruising  '[]'$ Easy bleeding   '[]'$ Hypercoagulable state   '[]'$ Anemic Gastrointestinal:  '[]'$ Diarrhea   '[]'$ Vomiting  '[]'$ Gastroesophageal reflux/heartburn   '[]'$ Difficulty swallowing. Genitourinary:  '[]'$ Chronic kidney disease   '[]'$ Difficult urination  '[]'$ Frequent urination   '[]'$ Blood in urine Skin:  '[]'$ Rashes   '[]'$ Ulcers  Psychological:  '[]'$ History of anxiety   '[]'$  History of major depression.  Physical Examination  Vitals:   10/24/21 1141  BP: (!) 147/76  Pulse: 60  Resp: 16  Weight: 174 lb (78.9 kg)  Height: '5\' 4"'$  (1.626 m)   Body mass index is 29.87 kg/m. Gen: WD/WN, NAD Head: [place]/AT, No temporalis wasting.  Ear/Nose/Throat: Hearing grossly intact, nares w/o erythema or drainage Eyes: PER, EOMI, sclera nonicteric.  Neck: Supple, no masses.  No bruit or JVD.  Pulmonary:  Good  air movement, no audible wheezing, no use of accessory muscles.  Cardiac: RRR, normal S1, S2, no Murmurs. Vascular:  carotid bruit noted Vessel Right Left  Radial Palpable Palpable  Carotid  Palpable  Palpable  Subclav  Palpable Palpable  Gastrointestinal: soft, non-distended. No guarding/no peritoneal signs.  Musculoskeletal: M/S 5/5 throughout.  No visible deformity.  Neurologic: CN 2-12 intact. Pain and light touch intact in extremities.  Symmetrical.  Speech is fluent. Motor exam as listed above. Psychiatric: Judgment intact, Mood & affect appropriate for pt's clinical situation. Dermatologic: No rashes or ulcers  noted.  No changes consistent with cellulitis.   CBC Lab Results  Component Value Date   WBC 7.3 07/25/2019   HGB 14.1 07/25/2019   HCT 43.2 07/25/2019   MCV 85.2 07/25/2019   PLT 306 07/25/2019    BMET    Component Value Date/Time   NA 139 07/25/2019 1117   K 4.1 07/25/2019 1117   CL 99 07/25/2019 1117   CO2 26 07/25/2019 1117   GLUCOSE 106 (H) 07/25/2019 1117   BUN 13 07/25/2019 1117   CREATININE 1.00 10/18/2021 0933   CALCIUM 9.5 07/25/2019 1117   GFRNONAA >60 07/25/2019 1117   GFRAA >60 07/25/2019 1117   Estimated Creatinine Clearance: 56.8 mL/min (by C-G formula based on SCr of 1 mg/dL).  COAG Lab Results  Component Value Date   INR 1.0 07/25/2019   INR 0.97 12/30/2017    Radiology CT ANGIO NECK W OR WO CONTRAST  Addendum Date: 10/19/2021   ADDENDUM REPORT: 10/19/2021 10:58 ADDENDUM: These results will be called to the ordering clinician or representative by the Radiologist Assistant, and communication documented in the PACS or [employer]. Electronically Signed   By: Genevie Ann M.D.   On: 10/19/2021 10:58   Result Date: 10/19/2021 CLINICAL DATA:  81 year old male with symptomatic carotid stenosis. EXAM: CT ANGIOGRAPHY NECK TECHNIQUE: Multidetector CT imaging of the neck was performed using the standard protocol during bolus administration of  intravenous contrast. Multiplanar CT image reconstructions and MIPs were obtained to evaluate the vascular anatomy. Carotid stenosis measurements (when applicable) are obtained utilizing NASCET criteria, using the distal internal carotid diameter as the denominator. RADIATION DOSE REDUCTION: This exam was performed according to the departmental dose-optimization program which includes automated exposure control, adjustment of the mA and/or kV according to patient size and/or use of iterative reconstruction technique. CONTRAST:  17m OMNIPAQUE IOHEXOL 350 MG/ML SOLN COMPARISON:  CTA head and neck 07/25/2019. FINDINGS: Skeleton: Prior sternotomy. 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 Indistinct sclerotic area in the left T2 body appears stable since 2021 and most likely benign. No acute osseous abnormality identified. Upper chest: Prior CABG. Visible central pulmonary arteries are patent. No superior mediastinal lymphadenopathy. Mild dependent atelectasis in both lungs. Other neck: No acute finding. Grossly stable visible brain parenchyma. Aortic arch: Calcified aortic atherosclerosis. Three vessel arch configuration. Tortuous proximal great vessels. Right carotid system: Mild brachiocephalic artery plaque and tortuosity without stenosis. No right CCA stenosis. Bulky soft more than calcified plaque at the right ICA origin does appear mildly progressed since 2021 (series 8, image 32) and high-grade, stenosis now numerically estimated up to 80 % with respect to the distal vessel. The right ICA remains patent to the skull base without additional stenosis. Visible right ICA siphon is patent with calcified plaque but no significant stenosis. Left carotid system: Tortuous left CCA origin with mild plaque. Kinked appearance at the thoracic inlet but no significant left CCA stenosis before the bifurcation. Progressed mostly soft plaque  throughout the left ICA origin and bulb now with radiographic string sign stenosis of the left ICA (series 6, image 104) along a segment of about 10 mm (series 8, image 97). Despite this the vessel remains patent to the skull base. Visible left ICA siphon is patent with only mild calcified plaque. Vertebral arteries: Mild plaque at the proximal right subclavian artery origin without stenosis. Normal right vertebral artery origin. Right vertebral artery is patent to the vertebrobasilar junction with no significant plaque or  stenosis. Right PICA origin is patent. Left subclavian artery origin plaque with stenosis estimated at 60 % with respect to the distal vessel (series 7, image 103). Highly diminutive left vertebral artery redemonstrated, only faint and thread-like through the V1 and V2 segments. Chronic non visualization of the vessel in the proximal V3 segment with stable reconstitution at the skull base. Left V4 remains thread-like but patent to the vertebrobasilar junction. 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 Visible basilar artery appears stable. Anatomic variants: Fetal type bilateral PCA origins demonstrated in 2021. Review of the MIP images confirms the above findings IMPRESSION: 1. Progressed bulky soft plaque at the Left ICA origin and bulb since 2021 now with RADIOGRAPHIC STRING SIGN STENOSIS along a 10 mm segment of the vessel (series 6, image 104). The Left ICA remains patent. 2. Lesser progression of chronic hemodynamically significant stenosis of the Right ICA origin due to soft plaque, now numerically estimated up to 80% (series 8, image 32 versus previously 70%). 3. Unchanged vertebral arteries: Dominant Right Vertebral Artery with no significant stenosis. 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 And grossly stable Basilar Artery  atherosclerosis and stenosis in the setting of bilateral fetal PCA origins. 4. Aortic Atherosclerosis (ICD10-I70.0). Left subclavian artery origin stenosis estimated at 60% with respect to the distal vessel. Electronically Signed: By: Genevie Ann M.D. On: 10/19/2021 10:30     Assessment/Plan 1. Symptomatic stenosis of both carotid arteries Recommend:  The patient is symptomatic with respect to the carotid stenosis.  The patient now has progressed and has a lesion the is >90%.  Patient's CT angiography of the carotid arteries confirms >90% left ICA stenosis.  The anatomical considerations support stenting over surgery.  This was discussed in detail with the patient.  The risks, benefits and alternative therapies were reviewed in detail with the patient.  All questions were answered.  The patient agrees to proceed with stenting of the left carotid artery.  Continue antiplatelet therapy as prescribed. Continue management of CAD, HTN and Hyperlipidemia. Healthy heart diet, encouraged exercise at least 4 times per week.    2. CAD in native artery Continue cardiac and antihypertensive medications as already ordered and reviewed, no changes at this time.  Continue statin as ordered and reviewed, no changes at this time  Nitrates PRN for chest pain   3. Benign essential HTN Continue antihypertensive medications as already ordered, these medications have been reviewed and there are no changes at this time.   4. Mixed hyperlipidemia Continue statin as ordered and reviewed, no changes at this time     Hortencia Pilar, MD  10/29/2021 4:00 PM

## 2021-11-02 ENCOUNTER — Telehealth (INDEPENDENT_AMBULATORY_CARE_PROVIDER_SITE_OTHER): Payer: Self-pay

## 2021-11-02 NOTE — Telephone Encounter (Signed)
Spoke with the patient's spouse and he is scheduled with Dr. Delana Meyer for a left carotid stent placement on 11/08/21 with a 11:00 am arrival time to the MM. Pre-procedure instructions were discussed and will be mailed.

## 2021-11-08 ENCOUNTER — Encounter: Payer: Self-pay | Admitting: Vascular Surgery

## 2021-11-08 ENCOUNTER — Other Ambulatory Visit: Payer: Self-pay

## 2021-11-08 ENCOUNTER — Inpatient Hospital Stay
Admission: RE | Admit: 2021-11-08 | Discharge: 2021-11-09 | DRG: 036 | Disposition: A | Discharge status: Home / Self Care | Payer: Medicare HMO | Attending: Vascular Surgery | Admitting: Vascular Surgery

## 2021-11-08 ENCOUNTER — Encounter
Admission: RE | Disposition: A | Discharge status: Home / Self Care | Payer: Self-pay | Source: Home / Self Care | Attending: Vascular Surgery

## 2021-11-08 DIAGNOSIS — Z951 Presence of aortocoronary bypass graft: Secondary | ICD-10-CM | POA: Diagnosis not present

## 2021-11-08 DIAGNOSIS — I6523 Occlusion and stenosis of bilateral carotid arteries: Secondary | ICD-10-CM | POA: Diagnosis not present

## 2021-11-08 DIAGNOSIS — M5030 Other cervical disc degeneration, unspecified cervical region: Secondary | ICD-10-CM | POA: Diagnosis present

## 2021-11-08 DIAGNOSIS — Z79890 Hormone replacement therapy (postmenopausal): Secondary | ICD-10-CM

## 2021-11-08 DIAGNOSIS — I2510 Atherosclerotic heart disease of native coronary artery without angina pectoris: Secondary | ICD-10-CM | POA: Diagnosis not present

## 2021-11-08 DIAGNOSIS — Z8673 Personal history of transient ischemic attack (TIA), and cerebral infarction without residual deficits: Secondary | ICD-10-CM | POA: Diagnosis not present

## 2021-11-08 DIAGNOSIS — M1990 Unspecified osteoarthritis, unspecified site: Secondary | ICD-10-CM | POA: Diagnosis present

## 2021-11-08 DIAGNOSIS — Z7982 Long term (current) use of aspirin: Secondary | ICD-10-CM | POA: Diagnosis not present

## 2021-11-08 DIAGNOSIS — Z7902 Long term (current) use of antithrombotics/antiplatelets: Secondary | ICD-10-CM | POA: Diagnosis not present

## 2021-11-08 DIAGNOSIS — I10 Essential (primary) hypertension: Secondary | ICD-10-CM | POA: Diagnosis present

## 2021-11-08 DIAGNOSIS — K219 Gastro-esophageal reflux disease without esophagitis: Secondary | ICD-10-CM | POA: Diagnosis present

## 2021-11-08 DIAGNOSIS — Z85828 Personal history of other malignant neoplasm of skin: Secondary | ICD-10-CM | POA: Diagnosis not present

## 2021-11-08 DIAGNOSIS — I959 Hypotension, unspecified: Secondary | ICD-10-CM | POA: Diagnosis not present

## 2021-11-08 DIAGNOSIS — E782 Mixed hyperlipidemia: Secondary | ICD-10-CM | POA: Diagnosis present

## 2021-11-08 DIAGNOSIS — R001 Bradycardia, unspecified: Secondary | ICD-10-CM | POA: Diagnosis not present

## 2021-11-08 DIAGNOSIS — E039 Hypothyroidism, unspecified: Secondary | ICD-10-CM | POA: Diagnosis not present

## 2021-11-08 DIAGNOSIS — I6522 Occlusion and stenosis of left carotid artery: Secondary | ICD-10-CM

## 2021-11-08 DIAGNOSIS — Z96653 Presence of artificial knee joint, bilateral: Secondary | ICD-10-CM | POA: Diagnosis present

## 2021-11-08 DIAGNOSIS — Z836 Family history of other diseases of the respiratory system: Secondary | ICD-10-CM

## 2021-11-08 DIAGNOSIS — Z87891 Personal history of nicotine dependence: Secondary | ICD-10-CM | POA: Diagnosis not present

## 2021-11-08 DIAGNOSIS — I6529 Occlusion and stenosis of unspecified carotid artery: Principal | ICD-10-CM | POA: Diagnosis present

## 2021-11-08 DIAGNOSIS — Z79899 Other long term (current) drug therapy: Secondary | ICD-10-CM

## 2021-11-08 DIAGNOSIS — Z8249 Family history of ischemic heart disease and other diseases of the circulatory system: Secondary | ICD-10-CM

## 2021-11-08 DIAGNOSIS — I63232 Cerebral infarction due to unspecified occlusion or stenosis of left carotid arteries: Secondary | ICD-10-CM

## 2021-11-08 HISTORY — PX: CAROTID PTA/STENT INTERVENTION: CATH118231

## 2021-11-08 LAB — GLUCOSE, CAPILLARY: Glucose-Capillary: 81 mg/dL (ref 70–99)

## 2021-11-08 LAB — MRSA NEXT GEN BY PCR, NASAL: MRSA by PCR Next Gen: NOT DETECTED

## 2021-11-08 LAB — CREATININE, SERUM
Creatinine, Ser: 0.89 mg/dL (ref 0.61–1.24)
GFR, Estimated: >60 mL/min (ref >60)

## 2021-11-08 LAB — BUN: BUN: 15 mg/dL (ref 8–23)

## 2021-11-08 LAB — POCT ACTIVATED CLOTTING TIME: Activated Clotting Time: 239 seconds

## 2021-11-08 SURGERY — CAROTID PTA/STENT INTERVENTION
Anesthesia: Moderate Sedation | Laterality: Left

## 2021-11-08 MED ORDER — MIDAZOLAM HCL 5 MG/5ML IJ SOLN
INTRAMUSCULAR | Status: AC
  Filled 2021-11-08: qty 5

## 2021-11-08 MED ORDER — CHLORHEXIDINE GLUCONATE CLOTH 2 % EX PADS
6.0000 | MEDICATED_PAD | Freq: Every day | CUTANEOUS | Status: DC
  Administered 2021-11-08 – 2021-11-09 (×2): 6 via TOPICAL

## 2021-11-08 MED ORDER — IODIXANOL 320 MG/ML IV SOLN
INTRAVENOUS | Status: DC | PRN
  Administered 2021-11-08: 60 mL

## 2021-11-08 MED ORDER — FENTANYL CITRATE (PF) 100 MCG/2ML IJ SOLN
INTRAMUSCULAR | Status: AC
  Filled 2021-11-08: qty 2

## 2021-11-08 MED ORDER — GUAIFENESIN-DM 100-10 MG/5ML PO SYRP: 15.0000 mL | ORAL_SOLUTION | ORAL | Status: DC | PRN

## 2021-11-08 MED ORDER — PHENYLEPHRINE 80 MCG/ML (10ML) SYRINGE FOR IV PUSH (FOR BLOOD PRESSURE SUPPORT)
PREFILLED_SYRINGE | INTRAVENOUS | Status: AC
  Filled 2021-11-08: qty 10

## 2021-11-08 MED ORDER — FAMOTIDINE 20 MG PO TABS: 40.0000 mg | ORAL_TABLET | Freq: Once | ORAL | Status: DC | PRN

## 2021-11-08 MED ORDER — HEPARIN SODIUM (PORCINE) 1000 UNIT/ML IJ SOLN
INTRAMUSCULAR | Status: DC | PRN
  Administered 2021-11-08: 3000 [IU] via INTRAVENOUS
  Administered 2021-11-08: 8000 [IU] via INTRAVENOUS

## 2021-11-08 MED ORDER — PHENYLEPHRINE HCL-NACL 20-0.9 MG/250ML-% IV SOLN
INTRAVENOUS | Status: DC
Start: 2021-11-08 — End: 2021-11-08
  Filled 2021-11-08: qty 250

## 2021-11-08 MED ORDER — FENTANYL CITRATE (PF) 100 MCG/2ML IJ SOLN
INTRAMUSCULAR | Status: DC | PRN
  Administered 2021-11-08 (×2): 25 ug via INTRAVENOUS
  Administered 2021-11-08: 50 ug via INTRAVENOUS

## 2021-11-08 MED ORDER — LEVOTHYROXINE SODIUM 150 MCG PO TABS
150.0000 ug | ORAL_TABLET | Freq: Every day | ORAL | Status: DC
  Administered 2021-11-09: 150 ug via ORAL
  Filled 2021-11-08: qty 1

## 2021-11-08 MED ORDER — HEPARIN SODIUM (PORCINE) 1000 UNIT/ML IJ SOLN
INTRAMUSCULAR | Status: AC
Start: 2021-11-08 — End: 2021-11-09
  Filled 2021-11-08: qty 10

## 2021-11-08 MED ORDER — HEPARIN SODIUM (PORCINE) 1000 UNIT/ML IJ SOLN
INTRAMUSCULAR | Status: AC
  Filled 2021-11-08: qty 10

## 2021-11-08 MED ORDER — MIDAZOLAM HCL 2 MG/ML PO SYRP: 8.0000 mg | ORAL_SOLUTION | Freq: Once | ORAL | Status: DC | PRN

## 2021-11-08 MED ORDER — KCL IN DEXTROSE-NACL 20-5-0.9 MEQ/L-%-% IV SOLN
INTRAVENOUS | Status: DC
  Filled 2021-11-08 (×3): qty 1000

## 2021-11-08 MED ORDER — CEFAZOLIN SODIUM-DEXTROSE 1-4 GM/50ML-% IV SOLN
INTRAVENOUS | Status: AC | PRN
  Administered 2021-11-08: 2 g via INTRAVENOUS

## 2021-11-08 MED ORDER — ACETAMINOPHEN 650 MG RE SUPP: 325.0000 mg | RECTAL | Status: DC | PRN

## 2021-11-08 MED ORDER — DOCUSATE SODIUM 100 MG PO CAPS
100.0000 mg | ORAL_CAPSULE | Freq: Every day | ORAL | Status: DC
  Administered 2021-11-09: 100 mg via ORAL
  Filled 2021-11-08: qty 1

## 2021-11-08 MED ORDER — PHENYLEPHRINE HCL (PRESSORS) 10 MG/ML IV SOLN
INTRAVENOUS | Status: AC
  Filled 2021-11-08: qty 1

## 2021-11-08 MED ORDER — CEFAZOLIN SODIUM-DEXTROSE 2-4 GM/100ML-% IV SOLN
2.0000 g | Freq: Three times a day (TID) | INTRAVENOUS | Status: AC
  Administered 2021-11-09 (×2): 2 g via INTRAVENOUS
  Filled 2021-11-08 (×3): qty 100

## 2021-11-08 MED ORDER — ONDANSETRON HCL 4 MG/2ML IJ SOLN: 4.0000 mg | Freq: Four times a day (QID) | INTRAMUSCULAR | Status: DC | PRN

## 2021-11-08 MED ORDER — ALUM & MAG HYDROXIDE-SIMETH 200-200-20 MG/5ML PO SUSP: 15.0000 mL | ORAL | Status: DC | PRN

## 2021-11-08 MED ORDER — ATROPINE SULFATE 1 MG/10ML IJ SOSY
PREFILLED_SYRINGE | INTRAMUSCULAR | Status: DC | PRN
  Administered 2021-11-08: 1 mg via INTRAVENOUS

## 2021-11-08 MED ORDER — ACETAMINOPHEN 325 MG PO TABS
325.0000 mg | ORAL_TABLET | ORAL | Status: DC | PRN
  Administered 2021-11-09: 650 mg via ORAL
  Filled 2021-11-08: qty 2

## 2021-11-08 MED ORDER — METOPROLOL TARTRATE 5 MG/5ML IV SOLN: 2.0000 mg | INTRAVENOUS | Status: DC | PRN

## 2021-11-08 MED ORDER — ASPIRIN 325 MG PO TBEC
325.0000 mg | DELAYED_RELEASE_TABLET | Freq: Every day | ORAL | Status: DC
  Administered 2021-11-08: 325 mg via ORAL
  Filled 2021-11-08: qty 1

## 2021-11-08 MED ORDER — DIPHENHYDRAMINE HCL 50 MG/ML IJ SOLN
50.0000 mg | Freq: Once | INTRAMUSCULAR | Status: DC | PRN
Start: 2021-11-08 — End: 2021-11-08

## 2021-11-08 MED ORDER — SODIUM CHLORIDE 0.9 % IV SOLN: INTRAVENOUS | Status: DC

## 2021-11-08 MED ORDER — LABETALOL HCL 5 MG/ML IV SOLN: 10.0000 mg | INTRAVENOUS | Status: DC | PRN

## 2021-11-08 MED ORDER — CLOPIDOGREL BISULFATE 75 MG PO TABS
75.0000 mg | ORAL_TABLET | Freq: Every day | ORAL | Status: DC
  Administered 2021-11-08 – 2021-11-09 (×2): 75 mg via ORAL
  Filled 2021-11-08 (×2): qty 1

## 2021-11-08 MED ORDER — ATORVASTATIN CALCIUM 20 MG PO TABS
80.0000 mg | ORAL_TABLET | Freq: Every day | ORAL | Status: DC
  Administered 2021-11-08: 80 mg via ORAL
  Filled 2021-11-08: qty 4
  Filled 2021-11-08: qty 1

## 2021-11-08 MED ORDER — TAMSULOSIN HCL 0.4 MG PO CAPS
0.8000 mg | ORAL_CAPSULE | Freq: Every day | ORAL | Status: DC
  Administered 2021-11-08: 0.8 mg via ORAL
  Filled 2021-11-08: qty 2

## 2021-11-08 MED ORDER — HYDROMORPHONE HCL 1 MG/ML IJ SOLN: 1.0000 mg | Freq: Once | INTRAMUSCULAR | Status: DC | PRN

## 2021-11-08 MED ORDER — SODIUM CHLORIDE 0.9 % IV SOLN: 500.0000 mL | Freq: Once | INTRAVENOUS | Status: DC | PRN

## 2021-11-08 MED ORDER — PHENOL 1.4 % MT LIQD
1.0000 | OROMUCOSAL | Status: DC | PRN
Start: 2021-11-08 — End: 2021-11-09

## 2021-11-08 MED ORDER — MIDAZOLAM HCL 2 MG/2ML IJ SOLN
INTRAMUSCULAR | Status: DC | PRN
  Administered 2021-11-08 (×2): 1 mg via INTRAVENOUS
  Administered 2021-11-08 (×2): .5 mg via INTRAVENOUS

## 2021-11-08 MED ORDER — ATROPINE SULFATE 1 MG/10ML IJ SOSY
PREFILLED_SYRINGE | INTRAMUSCULAR | Status: AC
  Filled 2021-11-08: qty 10

## 2021-11-08 MED ORDER — METHYLPREDNISOLONE SODIUM SUCC 125 MG IJ SOLR: 125.0000 mg | Freq: Once | INTRAMUSCULAR | Status: DC | PRN

## 2021-11-08 MED ORDER — CEFAZOLIN SODIUM-DEXTROSE 2-4 GM/100ML-% IV SOLN: 2.0000 g | INTRAVENOUS | Status: DC

## 2021-11-08 MED ORDER — HYDRALAZINE HCL 20 MG/ML IJ SOLN: 5.0000 mg | INTRAMUSCULAR | Status: DC | PRN

## 2021-11-08 MED ORDER — PANTOPRAZOLE SODIUM 40 MG PO TBEC: 40.0000 mg | DELAYED_RELEASE_TABLET | Freq: Every day | ORAL | Status: DC

## 2021-11-08 MED ORDER — PANTOPRAZOLE SODIUM 40 MG IV SOLR
40.0000 mg | Freq: Every day | INTRAVENOUS | Status: DC
  Administered 2021-11-08: 40 mg via INTRAVENOUS
  Filled 2021-11-08: qty 10

## 2021-11-08 SURGICAL SUPPLY — 23 items
BALLN VIATRAC 5X30X135 (BALLOONS) ×2
BALLOON VIATRAC 5X30X135 (BALLOONS) IMPLANT
CATH ANGIO 5F PIGTAIL 100CM (CATHETERS) ×1 IMPLANT
CATH VTK 5FR 125CM BEACON TIP (CATHETERS) ×1 IMPLANT
COVER DRAPE FLUORO 36X44 (DRAPES) ×1 IMPLANT
COVER PROBE U/S 5X48 (MISCELLANEOUS) ×1 IMPLANT
DEVICE EMBOSHIELD NAV6 4.0-7.0 (FILTER) ×1 IMPLANT
DEVICE SAFEGUARD 24CM (GAUZE/BANDAGES/DRESSINGS) ×1 IMPLANT
DEVICE STARCLOSE SE CLOSURE (Vascular Products) ×1 IMPLANT
DEVICE TORQUE .025-.038 (MISCELLANEOUS) ×1 IMPLANT
GLIDEWIRE ANGLED SS 035X260CM (WIRE) ×1 IMPLANT
GUIDEWIRE VASC STIFF .038X260 (WIRE) ×1 IMPLANT
KIT CAROTID MANIFOLD (MISCELLANEOUS) ×1 IMPLANT
KIT ENCORE 26 ADVANTAGE (KITS) ×1 IMPLANT
NDL ENTRY 21GA 7CM ECHOTIP (NEEDLE) IMPLANT
NEEDLE ENTRY 21GA 7CM ECHOTIP (NEEDLE) ×2 IMPLANT
PACK ANGIOGRAPHY (CUSTOM PROCEDURE TRAY) ×2 IMPLANT
SET INTRO CAPELLA COAXIAL (SET/KITS/TRAYS/PACK) ×1 IMPLANT
SHEATH BRITE TIP 5FRX11 (SHEATH) ×1 IMPLANT
SHEATH NEURON MAX 6FR 80CM (SHEATH) ×1 IMPLANT
STENT XACT CAR 10-8X40X136 (Permanent Stent) ×1 IMPLANT
SYR MEDRAD MARK 7 150ML (SYRINGE) ×2 IMPLANT
WIRE GUIDERIGHT .035X150 (WIRE) ×1 IMPLANT

## 2021-11-08 NOTE — Op Note (Signed)
OPERATIVE NOTE DATE: 11/08/2021  PROCEDURE:  Ultrasound guidance for vascular access right femoral artery  Placement of a 10 x 8 x 40 exact stent with the use of the NAV-6 embolic protection device in the left internal carotid artery  PRE-OPERATIVE DIAGNOSIS: 1.  Greater than 95% left internal carotid artery stenosis. 2.  CVA with visual disturbance  POST-OPERATIVE DIAGNOSIS:  Same as above  SURGEON: Hortencia Pilar  ASSISTANT(S): None  ANESTHESIA: local/MCS  ESTIMATED BLOOD LOSS: 50 cc  CONTRAST: 60 cc  FLUORO TIME: 11.7 minutes  MODERATE CONSCIOUS SEDATION TIME: Continuous ECG pulse oximetry and cardiopulmonary monitoring was performed throughout the entire procedure by the interventional radiology nurse total sedation time was 1 hour 10 minutes and 34 seconds.  FINDING(S): 1.   String sign in the left internal carotid artery   SPECIMEN(S):   none  INDICATIONS:   Patient is a 81 y.o. male who presents with symptomatic left carotid artery stenosis.  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  Risks and benefits were discussed and informed consent was obtained.   DESCRIPTION: After obtaining full informed written consent, the patient was brought back to the vascular suite and placed supine upon the table.  The patient received IV antibiotics prior to induction. Moderate conscious sedation was administered during a face to face encounter with the patient throughout the procedure with my supervision of the RN administering medicines and monitoring the patients vital signs and mental status throughout from the start of the procedure until the patient was taken to the recovery room.    After obtaining adequate sedation, the patient was prepped and draped in the standard fashion.    The right femoral artery was  visualized with ultrasound and found to be widely patent. It was then accessed under direct ultrasound guidance without difficulty with a micropuncture needle. A permanent image was recorded.  A microwire was then advanced without difficulty under fluoroscopic guidance followed by a micro-sheath.  A J-wire was placed and we then placed a 6 French sheath. The patient was then heparinized and a total of 8000 units of intravenous heparin were given and an ACT was checked to confirm successful anticoagulation.  After the CT was returned at 230 an additional 3000 units of heparin was given.  A pigtail catheter was then placed into the ascending aorta. This showed a bovine arch type I without evidence of hemodynamically significant ostial stenosis of the great vessels. The left common carotid artery was then selectively cannulated without difficulty with a V. Tech catheter and the catheter advanced into the mid left common carotid artery.  Cervical and cerebral carotid angiography was then performed. There were no obvious intracranial filling defects, also there was no cross-filling at the anterior cerebral arteries. The carotid bifurcation demonstrated greater than 95% stenosis at the origin of the left internal carotid artery.  I then advanced the wire and catheter into the external carotid artery and then exchanged for the Amplatz Super Stiff wire. Over the Amplatz Super Stiff wire, a 6 French penumbra neuro sheath was placed into the mid common carotid artery. I then used the NAV-6  Embolic protection device and crossed the lesion and parked this in the distal internal carotid artery at the base of the skull.  I then selected a 10 x 8 x 40 exact stent. This was deployed across  the lesion encompassing it in its entirety. A 5 mm by 30 millimeters length balloon was used to post dilate the stent. Only about a 10% residual stenosis was present after angioplasty. Completion angiogram showed normal intracranial filling  without new defects, there is now significant cross-filling into the right anterior cerebral artery which was not present prestenting. At this point I elected to terminate the procedure. The sheath was removed and StarClose closure device was deployed in the right femoral artery with excellent hemostatic result. The patient was taken to the recovery room in stable condition having tolerated the procedure well.  COMPLICATIONS: none  CONDITION: stable  Hortencia Pilar 11/08/2021 5:59 PM   This note was created with Dragon Medical transcription system. Any errors in dictation are purely unintentional.

## 2021-11-08 NOTE — Progress Notes (Signed)
Notified Dr Delana Meyer of Aflutter HR 30-50s. HR dropped to 36 Aflutter at 2045. Upon assessment alert/oriented *4 and denies dizziness, nausea, chest pain or any symptoms at this time. BP 144/70.

## 2021-11-08 NOTE — Interval H&P Note (Signed)
History and Physical Interval Note:  11/08/2021 11:35 AM  Warren Ingram  has presented today for surgery, with the diagnosis of LT Carotid Artery Stent Placement   LT Carotid Artery Stenosis   ABBOTT Rep.  The various methods of treatment have been discussed with the patient and family. After consideration of risks, benefits and other options for treatment, the patient has consented to  Procedure(s): CAROTID PTA/STENT INTERVENTION (Left) as a surgical intervention.  The patient's history has been reviewed, patient examined, no change in status, stable for surgery.  I have reviewed the patient's chart and labs.  Questions were answered to the patient's satisfaction.     Hortencia Pilar

## 2021-11-09 ENCOUNTER — Encounter: Payer: Self-pay | Admitting: Vascular Surgery

## 2021-11-09 LAB — CBC
HCT: 39.3 % (ref 39.0–52.0)
Hemoglobin: 13.6 g/dL (ref 13.0–17.0)
MCH: 32.8 pg (ref 26.0–34.0)
MCHC: 34.6 g/dL (ref 30.0–36.0)
MCV: 94.7 fL (ref 80.0–100.0)
Platelets: 185 10*3/uL (ref 150–400)
RBC: 4.15 MIL/uL — ABNORMAL LOW (ref 4.22–5.81)
RDW: 13.5 % (ref 11.5–15.5)
WBC: 6.1 10*3/uL (ref 4.0–10.5)
nRBC: 0 % (ref 0.0–0.2)

## 2021-11-09 LAB — BASIC METABOLIC PANEL
Anion gap: 4 — ABNORMAL LOW (ref 5–15)
BUN: 13 mg/dL (ref 8–23)
CO2: 24 mmol/L (ref 22–32)
Calcium: 8.6 mg/dL — ABNORMAL LOW (ref 8.9–10.3)
Chloride: 111 mmol/L (ref 98–111)
Creatinine, Ser: 0.8 mg/dL (ref 0.61–1.24)
GFR, Estimated: >60 mL/min (ref >60)
Glucose, Bld: 133 mg/dL — ABNORMAL HIGH (ref 70–99)
Potassium: 3.6 mmol/L (ref 3.5–5.1)
Sodium: 139 mmol/L (ref 135–145)

## 2021-11-09 MED ORDER — ASPIRIN 81 MG PO TBEC
81.0000 mg | DELAYED_RELEASE_TABLET | Freq: Every day | ORAL | Status: DC
  Administered 2021-11-09: 81 mg via ORAL
  Filled 2021-11-09: qty 1

## 2021-11-09 MED ORDER — ASPIRIN 81 MG PO TBEC: 81.0000 mg | DELAYED_RELEASE_TABLET | Freq: Every day | ORAL | 12 refills | Status: DC

## 2021-11-09 MED ORDER — PANTOPRAZOLE SODIUM 40 MG PO TBEC: 40.0000 mg | DELAYED_RELEASE_TABLET | Freq: Every day | ORAL | Status: DC

## 2021-11-09 NOTE — Progress Notes (Signed)
PHARMACIST - PHYSICIAN COMMUNICATION  CONCERNING: IV to Oral Route Change Policy  RECOMMENDATION: This patient is receiving pantoprazole by the intravenous route.  Based on criteria approved by the Pharmacy and Therapeutics Committee, the intravenous medication(s) is/are being converted to the equivalent oral dose form(s).   DESCRIPTION: These criteria include: The patient is eating (either orally or via tube) and/or has been taking other orally administered medications for a least 24 hours The patient has no evidence of active gastrointestinal bleeding or impaired GI absorption (gastrectomy, short bowel, patient on TNA or NPO).  If you have questions about this conversion, please contact the [place], [facility] 11/09/2021 11:01 AM

## 2021-11-09 NOTE — Consult Note (Signed)
[facility] Consultation Note  Patient ID: Warren Ingram, MRN: [id number], DOB/AGE: 1941-05-05 81 y.o. Admit date: 11/08/2021   Date of Consult: 11/09/2021 Primary Physician: Idelle Crouch, MD    Chief Complaint: No chief complaint on file.  Reason for Consult:  Hypertension bradycardia  HPI: 81 y.o. male with known hypertension hyperlipidemia coronary artery atherosclerosis and significant carotid atherosclerosis for which the patient has had a left carotid stenting.  Stenting went well without complication patient had some hypotension and bradycardia procedure  This was concerning there was no evidence of significant symptoms.  The patient did have fluid given and his blood pressure stabilized.  All of his medications for blood pressure control were held and he had no further episodes bradycardia.  The patient currently has regular rate on telemetry suggesting normal sinus rhythm at 64 bpm with good blood pressure control.  The patient remains on dual antiplatelet therapy for his stent and feels well.  The patient also has had high intensity cholesterol therapy for which she is tolerating it well.  He has been ambulating well without any further issues today  Past Medical History:  Diagnosis Date   Arthritis    Barrett esophagus    Benign neoplasm of abdomen    Benign neoplasm of colon    Cancer (HCC)    skin cancer   Coronary artery disease    GERD (gastroesophageal reflux disease)    Heart murmur    ASYMPTOMATIC   History of hiatal hernia    Hypercholesterolemia    Hypertension    Hypothyroidism    Stroke ([place]) 2015   mini stroke      Surgical History:  Past Surgical History:  Procedure Laterality Date   CARDIAC CATHETERIZATION     CAROTID PTA/STENT INTERVENTION Left 11/08/2021   Procedure: CAROTID PTA/STENT INTERVENTION;  Surgeon: Katha Cabal, MD;  Location: [place] [facility];  Service: Cardiovascular;  Laterality: Left;   COLONOSCOPY WITH  PROPOFOL N/A 01/01/2018   Procedure: COLONOSCOPY WITH PROPOFOL;  Surgeon: Jonathon Bellows, MD;  Location: [facility] ENDOSCOPY;  Service: Gastroenterology;  Laterality: N/A;   CORONARY ARTERY BYPASS GRAFT  08/2017   4 vessels   ESOPHAGOGASTRODUODENOSCOPY (EGD) WITH PROPOFOL N/A 10/28/2015   Procedure: ESOPHAGOGASTRODUODENOSCOPY (EGD) WITH PROPOFOL;  Surgeon: Hulen Luster, MD;  Location: [facility] ENDOSCOPY;  Service: Gastroenterology;  Laterality: N/A;   KNEE ARTHROSCOPY     LEFT HEART CATH AND CORONARY ANGIOGRAPHY N/A 07/25/2017   Procedure: LEFT HEART CATH AND CORONARY ANGIOGRAPHY;  Surgeon: Teodoro Spray, MD;  Location: [place] [facility];  Service: Cardiovascular;  Laterality: N/A;   NASAL SINUS SURGERY     PARTIAL KNEE ARTHROPLASTY Right 06/27/2019   Procedure: UNICOMPARTMENTAL KNEE;  Surgeon: Corky Mull, MD;  Location: [facility] ORS;  Service: Orthopedics;  Laterality: Right;   TOTAL KNEE ARTHROPLASTY Left 12/19/2018   Procedure: TOTAL KNEE ARTHROPLASTY;  Surgeon: Corky Mull, MD;  Location: [facility] ORS;  Service: Orthopedics;  Laterality: Left;   VASECTOMY       Home Meds: Prior to Admission medications   Medication Sig Start Date End Date Taking? Authorizing Provider  amLODipine (NORVASC) 5 MG tablet Take 5 mg by mouth daily. 10/22/21  Yes [provider]  aspirin 81 MG EC tablet Take 325 mg by mouth daily.    Yes [provider]  atorvastatin (LIPITOR) 80 MG tablet Take 80 mg by mouth at bedtime.    Yes [provider]  clopidogrel (PLAVIX) 75 MG  tablet Take 1 tablet (75 mg total) by mouth daily. 07/27/19  Yes Max Sane, MD  levothyroxine (SYNTHROID, LEVOTHROID) 150 MCG tablet Take 150 mcg by mouth daily before breakfast.   Yes [provider]  lisinopril (ZESTRIL) 10 MG tablet Take 10 mg by mouth daily. 09/05/21  Yes [provider]  metoprolol tartrate (LOPRESSOR) 50 MG tablet Take 50 mg by mouth 2 (two) times daily. 10/24/21  Yes [provider]   Omega-3 Fatty Acids (FISH OIL) 1200 MG CAPS Take 1,200 mg by mouth 2 (two) times a day.    Yes [provider]  omeprazole (PRILOSEC) 20 MG capsule Take 20 mg by mouth every morning.    Yes [provider]  tamsulosin (FLOMAX) 0.4 MG CAPS capsule Take 2 capsules (0.8 mg total) by mouth at bedtime. 12/30/18  Yes MacDiarmid, Nicki Reaper, MD  fluticasone (FLONASE) 50 MCG/ACT nasal spray Place 1 spray into both nostrils as needed.  Patient not taking: Reported on 10/10/2021    [provider]  furosemide (LASIX) 40 MG tablet Take 1 tablet by mouth daily. Patient not taking: Reported on 11/08/2021 08/11/17   [provider]  ibuprofen (ADVIL) 800 MG tablet Take 800 mg by mouth every 6 (six) hours as needed. Patient not taking: Reported on 11/08/2021 08/15/21   [provider]  metoprolol tartrate (LOPRESSOR) 25 MG tablet Take 25 mg by mouth 2 (two) times daily.  Patient not taking: Reported on 11/09/2021 09/03/17   [provider]  telmisartan (MICARDIS) 40 MG tablet Take 40 mg by mouth every morning.  Patient not taking: Reported on 11/08/2021    [provider]    Inpatient Medications:   aspirin EC  81 mg Oral Daily   atorvastatin  80 mg Oral QHS   Chlorhexidine Gluconate Cloth  6 each Topical Q0600   clopidogrel  75 mg Oral Daily   docusate sodium  100 mg Oral Daily   levothyroxine  150 mcg Oral QAC breakfast   pantoprazole  40 mg Oral QHS   tamsulosin  0.8 mg Oral QHS    sodium chloride     dextrose 5 % and 0.9 % NaCl with KCl 20 mEq/L Stopped (11/09/21 0709)    Allergies: No Known Allergies  Social History   Socioeconomic History   Marital status: Married    Spouse name: Not on file   Number of children: Not on file   Years of education: Not on file   Highest education level: Not on file  Occupational History   Not on file  Tobacco Use   Smoking status: Former    Packs/day: 1.00    Years: 15.00    Pack years: 15.00    Types:  Cigarettes    Quit date: 06/06/1971    Years since quitting: 50.4   Smokeless tobacco: Former  Scientific laboratory technician Use: Never used  Substance and Sexual Activity   Alcohol use: Yes    Alcohol/week: 2.0 standard drinks    Types: 1 Cans of beer, 1 Shots of liquor per week    Comment: 4 to 5 times per week   Drug use: No   Sexual activity: Not on file  Other Topics Concern   Not on file  Social History Narrative   Not on file   Social Determinants of Health   Financial Resource Strain: Not on file  Food Insecurity: Not on file  Transportation Needs: Not on file  Physical Activity: Not on file  Stress: Not on file  Social Connections: Not on file  Intimate Partner Violence: Not on file     Family History  Problem Relation Age of Onset   Heart attack Mother    Lung disease Father      Review of Systems Positive for none Negative for: General:  chills, fever, night sweats or weight changes.  Cardiovascular: PND orthopnea syncope dizziness  Dermatological skin lesions rashes Respiratory: Cough congestion Urologic: Frequent urination urination at night and hematuria Abdominal: negative for nausea, vomiting, diarrhea, bright red blood per rectum, melena, or hematemesis Neurologic: negative for visual changes, and/or hearing changes  All other systems reviewed and are otherwise negative except as noted above.  Labs: No results for input(s): CKTOTAL, CKMB, TROPONINI in the last 72 hours. Lab Results  Component Value Date   WBC 6.1 11/09/2021   HGB 13.6 11/09/2021   HCT 39.3 11/09/2021   MCV 94.7 11/09/2021   PLT 185 11/09/2021    Recent Labs  Lab 11/09/21 0417  NA 139  K 3.6  CL 111  CO2 24  BUN 13  CREATININE 0.80  CALCIUM 8.6*  GLUCOSE 133*   Lab Results  Component Value Date   CHOL 207 (H) 07/25/2019   HDL 32 (L) 07/25/2019   LDLCALC 108 (H) 07/25/2019   TRIG 333 (H) 07/25/2019   No results found for: DDIMER  Radiology/Studies:  CT ANGIO NECK W OR  WO CONTRAST  Addendum Date: 10/19/2021   ADDENDUM REPORT: 10/19/2021 10:58 ADDENDUM: These results will be called to the ordering clinician or representative by the Radiologist Assistant, and communication documented in the PACS or [employer]. Electronically Signed   By: Genevie Ann M.D.   On: 10/19/2021 10:58   Result Date: 10/19/2021 CLINICAL DATA:  81 year old male with symptomatic carotid stenosis. EXAM: CT ANGIOGRAPHY NECK TECHNIQUE: Multidetector CT imaging of the neck was performed using the standard protocol during bolus administration of intravenous contrast. Multiplanar CT image reconstructions and MIPs were obtained to evaluate the vascular anatomy. Carotid stenosis measurements (when applicable) are obtained utilizing NASCET criteria, using the distal internal carotid diameter as the denominator. RADIATION DOSE REDUCTION: This exam was performed according to the departmental dose-optimization program which includes automated exposure control, adjustment of the mA and/or kV according to patient size and/or use of iterative reconstruction technique. CONTRAST:  5m OMNIPAQUE IOHEXOL 350 MG/ML SOLN COMPARISON:  CTA head and neck 07/25/2019. FINDINGS: Skeleton: Prior sternotomy. 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 Indistinct sclerotic area in the left T2 body appears stable since 2021 and most likely benign. No acute osseous abnormality identified. Upper chest: Prior CABG. Visible central pulmonary arteries are patent. No superior mediastinal lymphadenopathy. Mild dependent atelectasis in both lungs. Other neck: No acute finding. Grossly stable visible brain parenchyma. Aortic arch: Calcified aortic atherosclerosis. Three vessel arch configuration. Tortuous proximal great vessels. Right carotid system: Mild brachiocephalic artery plaque and tortuosity without stenosis. No right CCA stenosis. Bulky soft more than  calcified plaque at the right ICA origin does appear mildly progressed since 2021 (series 8, image 32) and high-grade, stenosis now numerically estimated up to 80 % with respect to the distal vessel. The right ICA remains patent to the skull base without additional stenosis. Visible right ICA siphon is patent with calcified plaque but no significant stenosis. Left carotid system: Tortuous left CCA origin with mild plaque. Kinked appearance at the thoracic inlet but no significant left CCA stenosis before the bifurcation.  Progressed mostly soft plaque throughout the left ICA origin and bulb now with radiographic string sign stenosis of the left ICA (series 6, image 104) along a segment of about 10 mm (series 8, image 97). Despite this the vessel remains patent to the skull base. Visible left ICA siphon is patent with only mild calcified plaque. Vertebral arteries: Mild plaque at the proximal right subclavian artery origin without stenosis. Normal right vertebral artery origin. Right vertebral artery is patent to the vertebrobasilar junction with no significant plaque or stenosis. Right PICA origin is patent. Left subclavian artery origin plaque with stenosis estimated at 60 % with respect to the distal vessel (series 7, image 103). Highly diminutive left vertebral artery redemonstrated, only faint and thread-like through the V1 and V2 segments. Chronic non visualization of the vessel in the proximal V3 segment with stable reconstitution at the skull base. Left V4 remains thread-like but patent to the vertebrobasilar junction. 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 Visible basilar artery appears stable. Anatomic variants: Fetal type bilateral PCA origins demonstrated in 2021. Review of the MIP images confirms the above findings IMPRESSION: 1. Progressed bulky soft plaque at the Left ICA origin and bulb since 2021 now with  RADIOGRAPHIC STRING SIGN STENOSIS along a 10 mm segment of the vessel (series 6, image 104). The Left ICA remains patent. 2. Lesser progression of chronic hemodynamically significant stenosis of the Right ICA origin due to soft plaque, now numerically estimated up to 80% (series 8, image 32 versus previously 70%). 3. Unchanged vertebral arteries: Dominant Right Vertebral Artery with no significant stenosis. 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 And grossly stable Basilar Artery atherosclerosis and stenosis in the setting of bilateral fetal PCA origins. 4. Aortic Atherosclerosis (ICD10-I70.0). Left subclavian artery origin stenosis estimated at 60% with respect to the distal vessel. Electronically Signed: By: Genevie Ann M.D. On: 10/19/2021 10:30   PERIPHERAL VASCULAR CATHETERIZATION  Result Date: 11/08/2021 See surgical note for result.   EKG: Normal sinus rhythm  Weights: Filed Weights   11/08/21 1242 11/08/21 1745  Weight: 76.2 kg 75.7 kg     Physical Exam: Blood pressure (!) 152/88, pulse 62, temperature 97.8 F (36.6 C), temperature source Oral, resp. rate (!) 21, height '5\' 4"'$  (1.626 m), weight 75.7 kg, SpO2 100 %. Body mass index is 28.65 kg/m. General: Well developed, well nourished, in no acute distress. Head eyes ears nose throat: Normocephalic, atraumatic, sclera non-icteric, no xanthomas, nares are without discharge. No apparent thyromegaly and/or mass  Lungs: Normal respiratory effort.  no wheezes, no rales, no rhonchi.  Heart: RRR with normal S1 S2. no murmur gallop, no rub, PMI is normal size and placement, carotid upstroke normal without bruit, jugular venous pressure is normal Abdomen: Soft, non-tender, non-distended with normoactive bowel sounds. No hepatomegaly. No rebound/guarding. No obvious abdominal masses. Abdominal aorta is normal size without bruit Extremities: No edema. no cyanosis, no clubbing, no  ulcers  Peripheral : 2+ bilateral upper extremity pulses, 2+ bilateral femoral pulses, 2+ bilateral dorsal pedal pulse Neuro: Alert and oriented. No facial asymmetry. No focal deficit. Moves all extremities spontaneously. Musculoskeletal: Normal muscle tone without kyphosis Psych:  Responds to questions appropriately with a normal affect.    Assessment: 81 year old male with left carotid stenting without complication other than some minor bradycardia and hypotension now resolved without evidence of heart failure angina or acute coronary syndrome  Plan: 1.  Abstain from any blood pressure medication management at this time due to concerns of for hypotension and bradycardia 2.  Continue ambulation and follow-up for improvements of symptoms 3.  Dual antiplatelet for recent stent placement of left carotid 4.  High intensity cholesterol therapy 5.  Okay for discharge home from cardiac standpoint with follow-up in 1 to 2 weeks for further assessment and treatment  Signed, Corey Skains M.D. [facility] 11/09/2021, 2:13 PM

## 2021-11-09 NOTE — Discharge Summary (Addendum)
[facility]    Discharge Summary    Patient ID:  Warren Ingram MRN: [id number] DOB/AGE: 1941-04-13 81 y.o.  Admit date: 11/08/2021 Discharge date: 11/09/2021 Date of Surgery: 11/08/2021 Surgeon: Surgeon(s): Schnier, Dolores Lory, MD  Admission Diagnosis: Symptomatic carotid artery stenosis [I65.29]  Discharge Diagnoses:  Symptomatic carotid artery stenosis [I65.29]  Secondary Diagnoses: Past Medical History:  Diagnosis Date   Arthritis    Barrett esophagus    Benign neoplasm of abdomen    Benign neoplasm of colon    Cancer ([place])    skin cancer   Coronary artery disease    GERD (gastroesophageal reflux disease)    Heart murmur    ASYMPTOMATIC   History of hiatal hernia    Hypercholesterolemia    Hypertension    Hypothyroidism    Stroke ([place]) 2015   mini stroke    Procedure(s): CAROTID PTA/STENT INTERVENTION  Discharged Condition: good  HPI:  Warren Ingram is a 81 year old male who underwent a left internal carotid artery stent placement on 11/08/2021.  Prior to intervention CT angiogram noted a greater than 90% stenosis.  Following his intervention late yesterday evening the patient had hypotension as well as bradycardia.  This morning the patient has a normal blood pressure as well as normal sinus rhythm at 64 bpm.  The patient has been ambulating without issue.  He has not had any issues at his groin site.  Cardiology was consulted and recommended no medication adjustments.  They recommended follow-up in office in 1 to 2 weeks for further assessment and treatment.  The patient will also be discharged on dual antiplatelet therapy and statin.  Hospital Course:  Warren Ingram is a 81 y.o. male is S/P Left carotid stent placement Procedure(s): CAROTID PTA/STENT INTERVENTION Extubated: POD # 0 Physical exam: Groin clean and intact with no evidence of hematoma, no neurological deficits Post-op wounds clean, dry, intact or healing  well Pt. Ambulating, voiding and taking PO diet without difficulty. Pt pain controlled with PO pain meds. Labs as below Complications: hypotension and bradycardia   Consults: Cardiology (Dr. Nehemiah Massed)    Significant Diagnostic Studies: CBC Lab Results  Component Value Date   WBC 6.1 11/09/2021   HGB 13.6 11/09/2021   HCT 39.3 11/09/2021   MCV 94.7 11/09/2021   PLT 185 11/09/2021    BMET    Component Value Date/Time   NA 139 11/09/2021 0417   K 3.6 11/09/2021 0417   CL 111 11/09/2021 0417   CO2 24 11/09/2021 0417   GLUCOSE 133 (H) 11/09/2021 0417   BUN 13 11/09/2021 0417   CREATININE 0.80 11/09/2021 0417   CALCIUM 8.6 (L) 11/09/2021 0417   GFRNONAA >60 11/09/2021 0417   GFRAA >60 07/25/2019 1117   COAG Lab Results  Component Value Date   INR 1.0 07/25/2019   INR 0.97 12/30/2017     Disposition:  Discharge to :Home Discharge Instructions     CAROTID [place]: Call MD for difficulty swallowing or speaking; weakness in arms or legs that is a new symtom; severe headache.  If you have increased swelling in the neck and/or  are having difficulty breathing, CALL 911   Complete by: As directed    Call MD for:  redness, tenderness, or signs of infection (pain, swelling, bleeding, redness, odor or green/yellow discharge around incision site)   Complete by: As directed    Call MD for:  severe or increased pain, loss or decreased feeling  in affected limb(s)  Complete by: As directed    Call MD for:  temperature >100.5   Complete by: As directed    Driving Restrictions   Complete by: As directed    No driving for 2 weeks   Lifting restrictions   Complete by: As directed    No lifting for 2 weeks   Remove dressing in 48 hours   Complete by: As directed    OK to shower when dressing removed.  May keep covered with bandaid   Resume previous diet   Complete by: As directed       Allergies as of 11/09/2021   No Known Allergies      Medication List     STOP  taking these medications    amLODipine 5 MG tablet Commonly known as: NORVASC   lisinopril 10 MG tablet Commonly known as: ZESTRIL   telmisartan 40 MG tablet Commonly known as: MICARDIS       TAKE these medications    aspirin EC 81 MG tablet Take 1 tablet (81 mg total) by mouth daily. What changed: how much to take   atorvastatin 80 MG tablet Commonly known as: LIPITOR Take 80 mg by mouth at bedtime.   clopidogrel 75 MG tablet Commonly known as: PLAVIX Take 1 tablet (75 mg total) by mouth daily.   Fish Oil 1200 MG Caps Take 1,200 mg by mouth 2 (two) times a day.   fluticasone 50 MCG/ACT nasal spray Commonly known as: FLONASE Place 1 spray into both nostrils as needed.   furosemide 40 MG tablet Commonly known as: LASIX Take 1 tablet by mouth daily.   ibuprofen 800 MG tablet Commonly known as: ADVIL Take 800 mg by mouth every 6 (six) hours as needed.   levothyroxine 150 MCG tablet Commonly known as: SYNTHROID Take 150 mcg by mouth daily before breakfast.   metoprolol tartrate 25 MG tablet Commonly known as: LOPRESSOR Take 25 mg by mouth 2 (two) times daily. What changed: Another medication with the same name was removed. Continue taking this medication, and follow the directions you see here.   omeprazole 20 MG capsule Commonly known as: PRILOSEC Take 20 mg by mouth every morning.   tamsulosin 0.4 MG Caps capsule Commonly known as: FLOMAX Take 2 capsules (0.8 mg total) by mouth at bedtime.       Verbal and written Discharge instructions given to the patient. Wound care per Discharge AVS  Follow-up Information     Schnier, Dolores Lory, MD Follow up on 12/16/2021.   Specialties: Vascular Surgery, Cardiology, Radiology, Vascular Surgery Why: See GS/FB in 4 weeks with carotid DR Newman AT 1;00pm Contact information: [place] [address] [phone]         Teodoro Spray, MD Follow up on 12/07/2021.   Specialty:  Cardiology Why: Follow up 1-2 weeks DR [facility] JULY 5 TH 2023 AT 2;30 pm in [place] information: [address] [place] [address] [phone]                 Signed: Kris Hartmann, NP  11/09/2021, 4:16 PM

## 2021-11-09 NOTE — Discharge Instructions (Signed)
Hold your Lisinopril and Amlodipine until you see Dr. Ubaldo Glassing.  Continue to take Metoprolol '25mg'$  BID

## 2021-11-09 NOTE — TOC Initial Note (Signed)
Transition of Care [facility]) - Initial/Assessment Note    Patient Details  Name: Warren Ingram MRN: [id number] Date of Birth: 18-Jan-1941  Transition of Care [facility]) CM/SW Contact:    Shelbie Hutching, RN Phone Number: 11/09/2021, 11:23 AM  Clinical Narrative:                  Transition of Care [facility]) Screening Note   Patient Details  Name: Warren Ingram Date of Birth: January 29, 1941   Transition of Care [facility]) CM/SW Contact:    Shelbie Hutching, RN Phone Number: 11/09/2021, 11:23 AM    Transition of Care Department [facility]) has reviewed patient and no TOC needs have been identified at this time. We will continue to monitor patient advancement through interdisciplinary progression rounds. If new patient transition needs arise, please place a TOC consult.          Patient Goals and CMS Choice        Expected Discharge Plan and Services                                                Prior Living Arrangements/Services                       Activities of Daily Living Home Assistive Devices/Equipment: None ADL Screening (condition at time of admission) Patient's cognitive ability adequate to safely complete daily activities?: Yes Is the patient deaf or have difficulty hearing?: Yes Does the patient have difficulty seeing, even when wearing glasses/contacts?: No Does the patient have difficulty concentrating, remembering, or making decisions?: No Patient able to express need for assistance with ADLs?: Yes Does the patient have difficulty dressing or bathing?: No Independently performs ADLs?: Yes (appropriate for developmental age) Does the patient have difficulty walking or climbing stairs?: Yes (hip pain) Weakness of Legs: Left Weakness of Arms/Hands: Left  Permission Sought/Granted                  Emotional Assessment              Admission diagnosis:  Symptomatic carotid artery stenosis [I65.29] Patient Active Problem List    Diagnosis Date Noted   Symptomatic carotid artery stenosis 10/09/2021   CVA (cerebral vascular accident) ([place]) 07/25/2019   Acute CVA (cerebrovascular accident) ([place])    Hyperlipidemia    Status post total knee replacement using cement, 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   Hematochezia 12/30/2017   Hypothyroidism 12/30/2017   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   Benign neoplasm of colon 08/21/2013   CAD in native artery 08/21/2013   Diverticulitis of colon 08/21/2013   GERD (gastroesophageal reflux disease) 08/21/2013   Benign essential HTN 08/21/2013   Anal bleeding 08/21/2013   Arthritis, degenerative 08/21/2013   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

## 2021-11-14 DIAGNOSIS — Z09 Encounter for follow-up examination after completed treatment for conditions other than malignant neoplasm: Secondary | ICD-10-CM | POA: Diagnosis not present

## 2021-11-14 DIAGNOSIS — R053 Chronic cough: Secondary | ICD-10-CM | POA: Diagnosis not present

## 2021-11-14 DIAGNOSIS — E119 Type 2 diabetes mellitus without complications: Secondary | ICD-10-CM | POA: Diagnosis not present

## 2021-11-14 DIAGNOSIS — R001 Bradycardia, unspecified: Secondary | ICD-10-CM | POA: Diagnosis not present

## 2021-11-14 DIAGNOSIS — R059 Cough, unspecified: Secondary | ICD-10-CM | POA: Diagnosis not present

## 2021-11-14 DIAGNOSIS — I6522 Occlusion and stenosis of left carotid artery: Secondary | ICD-10-CM | POA: Diagnosis not present

## 2021-11-14 DIAGNOSIS — I10 Essential (primary) hypertension: Secondary | ICD-10-CM | POA: Diagnosis not present

## 2021-11-21 DIAGNOSIS — I10 Essential (primary) hypertension: Secondary | ICD-10-CM | POA: Diagnosis not present

## 2021-11-21 DIAGNOSIS — E538 Deficiency of other specified B group vitamins: Secondary | ICD-10-CM | POA: Diagnosis not present

## 2021-11-21 DIAGNOSIS — R7309 Other abnormal glucose: Secondary | ICD-10-CM | POA: Diagnosis not present

## 2021-12-07 DIAGNOSIS — I2510 Atherosclerotic heart disease of native coronary artery without angina pectoris: Secondary | ICD-10-CM | POA: Diagnosis not present

## 2021-12-07 DIAGNOSIS — I639 Cerebral infarction, unspecified: Secondary | ICD-10-CM | POA: Diagnosis not present

## 2021-12-07 DIAGNOSIS — E7800 Pure hypercholesterolemia, unspecified: Secondary | ICD-10-CM | POA: Diagnosis not present

## 2021-12-07 DIAGNOSIS — I739 Peripheral vascular disease, unspecified: Secondary | ICD-10-CM | POA: Diagnosis not present

## 2021-12-07 DIAGNOSIS — I10 Essential (primary) hypertension: Secondary | ICD-10-CM | POA: Diagnosis not present

## 2021-12-07 DIAGNOSIS — R7309 Other abnormal glucose: Secondary | ICD-10-CM | POA: Diagnosis not present

## 2021-12-12 DIAGNOSIS — C44311 Basal cell carcinoma of skin of nose: Secondary | ICD-10-CM | POA: Diagnosis not present

## 2021-12-15 ENCOUNTER — Other Ambulatory Visit (INDEPENDENT_AMBULATORY_CARE_PROVIDER_SITE_OTHER): Payer: Self-pay | Admitting: Vascular Surgery

## 2021-12-15 DIAGNOSIS — I6522 Occlusion and stenosis of left carotid artery: Secondary | ICD-10-CM

## 2021-12-15 DIAGNOSIS — Z959 Presence of cardiac and vascular implant and graft, unspecified: Secondary | ICD-10-CM

## 2021-12-16 ENCOUNTER — Ambulatory Visit (INDEPENDENT_AMBULATORY_CARE_PROVIDER_SITE_OTHER): Payer: Medicare HMO | Admitting: Nurse Practitioner

## 2021-12-16 ENCOUNTER — Encounter (INDEPENDENT_AMBULATORY_CARE_PROVIDER_SITE_OTHER): Payer: Self-pay | Admitting: Nurse Practitioner

## 2021-12-16 ENCOUNTER — Ambulatory Visit (INDEPENDENT_AMBULATORY_CARE_PROVIDER_SITE_OTHER): Payer: Medicare HMO

## 2021-12-16 VITALS — BP 146/68 | HR 64 | Resp 17 | Ht 64.0 in | Wt 171.0 lb

## 2021-12-16 DIAGNOSIS — Z959 Presence of cardiac and vascular implant and graft, unspecified: Secondary | ICD-10-CM

## 2021-12-16 DIAGNOSIS — E782 Mixed hyperlipidemia: Secondary | ICD-10-CM

## 2021-12-16 DIAGNOSIS — I6522 Occlusion and stenosis of left carotid artery: Secondary | ICD-10-CM

## 2022-01-01 ENCOUNTER — Encounter (INDEPENDENT_AMBULATORY_CARE_PROVIDER_SITE_OTHER): Payer: Self-pay | Admitting: Nurse Practitioner

## 2022-01-01 NOTE — Progress Notes (Signed)
Subjective:    Patient ID: Warren Ingram, male    DOB: Nov 22, 1940, 81 y.o.   MRN: [id number] No chief complaint on file.   The patient is seen for follow up evaluation of carotid stenosis status post left carotid stent on 11/08/2021.  There were no post operative problems or complications related to the surgery.  The patient denies neck or incisional pain.  The patient denies interval amaurosis fugax. There is no recent history of TIA symptoms or focal motor deficits. There is no prior documented CVA.  The patient denies headache.  The patient is taking enteric-coated aspirin 81 mg daily.  No recent shortening of the patient's walking distance or new symptoms consistent with claudication.  No history of rest pain symptoms. No new ulcers or wounds of the lower extremities have occurred.  There is no history of DVT, PE or superficial thrombophlebitis. No recent episodes of angina or shortness of breath documented.   Today noninvasive studies show 1 to 39% stenosis of the left ICA.  Recently placed stent is patent.  40 to 59% stenosis of the right ICA.  The bilateral vertebral arteries have antegrade flow with normal flow hemodynamics in the bilateral subclavian arteries.      Review of Systems  All other systems reviewed and are negative.      Objective:   Physical Exam Vitals reviewed.  HENT:     Head: Normocephalic.  Neck:     Vascular: No carotid bruit.  Cardiovascular:     Rate and Rhythm: Normal rate.     Pulses: Normal pulses.  Pulmonary:     Effort: Pulmonary effort is normal.  Skin:    General: Skin is warm and dry.  Neurological:     Mental Status: He is alert and oriented to person, place, and time.  Psychiatric:        Mood and Affect: Mood normal.        Behavior: Behavior normal.        Thought Content: Thought content normal.        Judgment: Judgment normal.     BP (!) 146/68 (BP Location: Right Arm)   Pulse 64   Resp 17   Ht '5\' 4"'$  (1.626  m)   Wt 171 lb (77.6 kg)   BMI 29.35 kg/m   Past Medical History:  Diagnosis Date   Arthritis    Barrett esophagus    Benign neoplasm of abdomen    Benign neoplasm of colon    Cancer (HCC)    skin cancer   Coronary artery disease    GERD (gastroesophageal reflux disease)    Heart murmur    ASYMPTOMATIC   History of hiatal hernia    Hypercholesterolemia    Hypertension    Hypothyroidism    Stroke ([place]) 2015   mini stroke    Social History   Socioeconomic History   Marital status: Married    Spouse name: Not on file   Number of children: Not on file   Years of education: Not on file   Highest education level: Not on file  Occupational History   Not on file  Tobacco Use   Smoking status: Former    Packs/day: 1.00    Years: 15.00    Total pack years: 15.00    Types: Cigarettes    Quit date: 06/06/1971    Years since quitting: 50.6   Smokeless tobacco: Former  Scientific laboratory technician Use: Never used  Substance and  Sexual Activity   Alcohol use: Yes    Alcohol/week: 2.0 standard drinks of alcohol    Types: 1 Cans of beer, 1 Shots of liquor per week    Comment: 4 to 5 times per week   Drug use: No   Sexual activity: Not on file  Other Topics Concern   Not on file  Social History Narrative   Not on file   Social Determinants of Health   Financial Resource Strain: Not on file  Food Insecurity: Not on file  Transportation Needs: Not on file  Physical Activity: Not on file  Stress: Not on file  Social Connections: Not on file  Intimate Partner Violence: Not on file    Past Surgical History:  Procedure Laterality Date   CARDIAC CATHETERIZATION     CAROTID PTA/STENT INTERVENTION Left 11/08/2021   Procedure: CAROTID PTA/STENT INTERVENTION;  Surgeon: Katha Cabal, MD;  Location: [facility];  Service: Cardiovascular;  Laterality: Left;   COLONOSCOPY WITH PROPOFOL N/A 01/01/2018   Procedure: COLONOSCOPY WITH PROPOFOL;  Surgeon: Jonathon Bellows, MD;   Location: [facility]COPY;  Service: Gastroenterology;  Laterality: N/A;   CORONARY ARTERY BYPASS GRAFT  08/2017   4 vessels   ESOPHAGOGASTRODUODENOSCOPY (EGD) WITH PROPOFOL N/A 10/28/2015   Procedure: ESOPHAGOGASTRODUODENOSCOPY (EGD) WITH PROPOFOL;  Surgeon: Hulen Luster, MD;  Location: [facility] ENDOSCOPY;  Service: Gastroenterology;  Laterality: N/A;   KNEE ARTHROSCOPY     LEFT HEART CATH AND CORONARY ANGIOGRAPHY N/A 07/25/2017   Procedure: LEFT HEART CATH AND CORONARY ANGIOGRAPHY;  Surgeon: Teodoro Spray, MD;  Location: [facility];  Service: Cardiovascular;  Laterality: N/A;   NASAL SINUS SURGERY     PARTIAL KNEE ARTHROPLASTY Right 06/27/2019   Procedure: UNICOMPARTMENTAL KNEE;  Surgeon: Corky Mull, MD;  Location: [facility];  Service: Orthopedics;  Laterality: Right;   TOTAL KNEE ARTHROPLASTY Left 12/19/2018   Procedure: TOTAL KNEE ARTHROPLASTY;  Surgeon: Corky Mull, MD;  Location: [facility];  Service: Orthopedics;  Laterality: Left;   VASECTOMY      Family History  Problem Relation Age of Onset   Heart attack Mother    Lung disease Father     No Known Allergies     Latest Ref Rng & Units 11/09/2021    4:17 AM 07/25/2019   11:17 AM 06/25/2019    9:07 AM  CBC  WBC 4.0 - 10.5 K/uL 6.1  7.3  7.6   Hemoglobin 13.0 - 17.0 g/dL 13.6  14.1  13.4   Hematocrit 39.0 - 52.0 % 39.3  43.2  42.3   Platelets 150 - 400 K/uL 185  306  246       CMP     Component Value Date/Time   NA 139 11/09/2021 0417   K 3.6 11/09/2021 0417   CL 111 11/09/2021 0417   CO2 24 11/09/2021 0417   GLUCOSE 133 (H) 11/09/2021 0417   BUN 13 11/09/2021 0417   CREATININE 0.80 11/09/2021 0417   CALCIUM 8.6 (L) 11/09/2021 0417   PROT 8.1 07/25/2019 1117   ALBUMIN 4.2 07/25/2019 1117   AST 24 07/25/2019 1117   ALT 30 07/25/2019 1117   ALKPHOS 140 (H) 07/25/2019 1117   BILITOT 0.6 07/25/2019 1117   GFRNONAA >60 11/09/2021 0417   GFRAA >60 07/25/2019 1117     No results found.     Assessment &  Plan:   1. Left carotid stenosis Recommend:  The patient is s/p successful left carotid stent  Duplex ultrasound preoperatively shows 40-59% contralateral stenosis.  Continue antiplatelet therapy as prescribed Continue management of CAD, HTN and Hyperlipidemia Healthy heart diet,  encouraged exercise at least 4 times per week  Follow up in 3 months with duplex ultrasound and physical exam based on the patient's carotid stenting - VAS US CAROTID  2. Mixed hyperlipidemia Continue statin as ordered and reviewed, no changes at this time    Current Outpatient Medications on File Prior to Visit  Medication Sig Dispense Refill   aspirin EC 81 MG tablet Take 1 tablet (81 mg total) by mouth daily. 30 tablet 12   atorvastatin (LIPITOR) 80 MG tablet Take 80 mg by mouth at bedtime.      clopidogrel (PLAVIX) 75 MG tablet Take 1 tablet (75 mg total) by mouth daily. 30 tablet 0   levothyroxine (SYNTHROID, LEVOTHROID) 150 MCG tablet Take 150 mcg by mouth daily before breakfast.     metoprolol tartrate (LOPRESSOR) 25 MG tablet Take 25 mg by mouth 2 (two) times daily.     Omega-3 Fatty Acids (FISH OIL) 1200 MG CAPS Take 1,200 mg by mouth 2 (two) times a day.      omeprazole (PRILOSEC) 20 MG capsule Take 20 mg by mouth every morning.      tamsulosin (FLOMAX) 0.4 MG CAPS capsule Take 2 capsules (0.8 mg total) by mouth at bedtime. 60 capsule 1   fluticasone (FLONASE) 50 MCG/ACT nasal spray Place 1 spray into both nostrils as needed.  (Patient not taking: Reported on 10/10/2021)     furosemide (LASIX) 40 MG tablet Take 1 tablet by mouth daily. (Patient not taking: Reported on 11/08/2021)     ibuprofen (ADVIL) 800 MG tablet Take 800 mg by mouth every 6 (six) hours as needed. (Patient not taking: Reported on 11/08/2021)     No current facility-administered medications on file prior to visit.    There are no Patient Instructions on file for this visit. No follow-ups on file.   Kris Hartmann, NP

## 2022-02-01 DIAGNOSIS — I2510 Atherosclerotic heart disease of native coronary artery without angina pectoris: Secondary | ICD-10-CM | POA: Diagnosis not present

## 2022-02-08 DIAGNOSIS — E669 Obesity, unspecified: Secondary | ICD-10-CM | POA: Diagnosis not present

## 2022-02-08 DIAGNOSIS — I480 Paroxysmal atrial fibrillation: Secondary | ICD-10-CM | POA: Diagnosis not present

## 2022-02-08 DIAGNOSIS — I2510 Atherosclerotic heart disease of native coronary artery without angina pectoris: Secondary | ICD-10-CM | POA: Diagnosis not present

## 2022-02-08 DIAGNOSIS — E7800 Pure hypercholesterolemia, unspecified: Secondary | ICD-10-CM | POA: Diagnosis not present

## 2022-02-08 DIAGNOSIS — I483 Typical atrial flutter: Secondary | ICD-10-CM | POA: Diagnosis not present

## 2022-02-08 DIAGNOSIS — I10 Essential (primary) hypertension: Secondary | ICD-10-CM | POA: Diagnosis not present

## 2022-02-20 DIAGNOSIS — Z23 Encounter for immunization: Secondary | ICD-10-CM | POA: Diagnosis not present

## 2022-02-20 DIAGNOSIS — Z79899 Other long term (current) drug therapy: Secondary | ICD-10-CM | POA: Diagnosis not present

## 2022-02-20 DIAGNOSIS — I4891 Unspecified atrial fibrillation: Secondary | ICD-10-CM | POA: Diagnosis not present

## 2022-02-20 DIAGNOSIS — E039 Hypothyroidism, unspecified: Secondary | ICD-10-CM | POA: Diagnosis not present

## 2022-02-20 DIAGNOSIS — I2510 Atherosclerotic heart disease of native coronary artery without angina pectoris: Secondary | ICD-10-CM | POA: Diagnosis not present

## 2022-02-20 DIAGNOSIS — R7309 Other abnormal glucose: Secondary | ICD-10-CM | POA: Diagnosis not present

## 2022-02-20 DIAGNOSIS — Z125 Encounter for screening for malignant neoplasm of prostate: Secondary | ICD-10-CM | POA: Diagnosis not present

## 2022-02-20 DIAGNOSIS — I10 Essential (primary) hypertension: Secondary | ICD-10-CM | POA: Diagnosis not present

## 2022-02-20 DIAGNOSIS — E7800 Pure hypercholesterolemia, unspecified: Secondary | ICD-10-CM | POA: Diagnosis not present

## 2022-02-22 DIAGNOSIS — I483 Typical atrial flutter: Secondary | ICD-10-CM | POA: Diagnosis not present

## 2022-03-07 DIAGNOSIS — I480 Paroxysmal atrial fibrillation: Secondary | ICD-10-CM | POA: Diagnosis not present

## 2022-03-10 ENCOUNTER — Other Ambulatory Visit (INDEPENDENT_AMBULATORY_CARE_PROVIDER_SITE_OTHER): Payer: Self-pay | Admitting: Nurse Practitioner

## 2022-03-10 DIAGNOSIS — I6523 Occlusion and stenosis of bilateral carotid arteries: Secondary | ICD-10-CM

## 2022-03-13 ENCOUNTER — Ambulatory Visit (INDEPENDENT_AMBULATORY_CARE_PROVIDER_SITE_OTHER): Payer: Medicare HMO

## 2022-03-13 ENCOUNTER — Ambulatory Visit (INDEPENDENT_AMBULATORY_CARE_PROVIDER_SITE_OTHER): Payer: Medicare HMO | Admitting: Nurse Practitioner

## 2022-03-13 ENCOUNTER — Encounter (INDEPENDENT_AMBULATORY_CARE_PROVIDER_SITE_OTHER): Payer: Self-pay | Admitting: Nurse Practitioner

## 2022-03-13 VITALS — BP 138/71 | HR 57 | Resp 16 | Wt 173.8 lb

## 2022-03-13 DIAGNOSIS — I6523 Occlusion and stenosis of bilateral carotid arteries: Secondary | ICD-10-CM | POA: Diagnosis not present

## 2022-03-13 DIAGNOSIS — I10 Essential (primary) hypertension: Secondary | ICD-10-CM

## 2022-03-13 DIAGNOSIS — E782 Mixed hyperlipidemia: Secondary | ICD-10-CM

## 2022-03-14 ENCOUNTER — Encounter (INDEPENDENT_AMBULATORY_CARE_PROVIDER_SITE_OTHER): Payer: Self-pay | Admitting: Nurse Practitioner

## 2022-03-14 NOTE — Progress Notes (Signed)
Subjective:    Patient ID: Warren Ingram, male    DOB: Sep 13, 1940, 81 y.o.   MRN: [id number] Chief Complaint  Patient presents with   Follow-up    Ultrasound follow up    The patient is seen for follow up evaluation of carotid stenosis status post left carotid stent on 11/08/2021.  There were no post operative problems or complications related to the surgery.  The patient denies neck or incisional pain.   The patient denies interval amaurosis fugax. There is no recent history of TIA symptoms or focal motor deficits. There is no prior documented CVA.   The patient denies headache.   The patient is taking enteric-coated aspirin 81 mg daily.   No recent shortening of the patient's walking distance or new symptoms consistent with claudication.  No history of rest pain symptoms. No new ulcers or wounds of the lower extremities have occurred.   There is no history of DVT, PE or superficial thrombophlebitis. No recent episodes of angina or shortness of breath documented.    Today noninvasive studies show 1 to 39% stenosis of the left ICA.  Recently placed stent is patent.  40 to 59% stenosis of the right ICA.  The bilateral vertebral arteries have antegrade flow with normal flow hemodynamics in the bilateral subclavian arteries.      Review of Systems  All other systems reviewed and are negative.      Objective:   Physical Exam Vitals reviewed.  HENT:     Head: Normocephalic.  Neck:     Vascular: No carotid bruit.  Cardiovascular:     Rate and Rhythm: Normal rate.     Pulses: Normal pulses.  Pulmonary:     Effort: Pulmonary effort is normal.  Skin:    General: Skin is warm and dry.  Neurological:     Mental Status: He is alert and oriented to person, place, and time.  Psychiatric:        Mood and Affect: Mood normal.        Behavior: Behavior normal.        Thought Content: Thought content normal.        Judgment: Judgment normal.     BP 138/71 (BP Location:  Left Arm)   Pulse (!) 57   Resp 16   Wt 173 lb 12.8 oz (78.8 kg)   BMI 29.83 kg/m   Past Medical History:  Diagnosis Date   Arthritis    Barrett esophagus    Benign neoplasm of abdomen    Benign neoplasm of colon    Cancer (HCC)    skin cancer   Coronary artery disease    GERD (gastroesophageal reflux disease)    Heart murmur    ASYMPTOMATIC   History of hiatal hernia    Hypercholesterolemia    Hypertension    Hypothyroidism    Stroke ([place]) 2015   mini stroke    Social History   Socioeconomic History   Marital status: Married    Spouse name: Not on file   Number of children: Not on file   Years of education: Not on file   Highest education level: Not on file  Occupational History   Not on file  Tobacco Use   Smoking status: Former    Packs/day: 1.00    Years: 15.00    Total pack years: 15.00    Types: Cigarettes    Quit date: 06/06/1971    Years since quitting: 50.8   Smokeless tobacco: Former  Vaping Use   Vaping Use: Never used  Substance and Sexual Activity   Alcohol use: Yes    Alcohol/week: 2.0 standard drinks of alcohol    Types: 1 Cans of beer, 1 Shots of liquor per week    Comment: 4 to 5 times per week   Drug use: No   Sexual activity: Not on file  Other Topics Concern   Not on file  Social History Narrative   Not on file   Social Determinants of Health   Financial Resource Strain: Not on file  Food Insecurity: Not on file  Transportation Needs: Not on file  Physical Activity: Not on file  Stress: Not on file  Social Connections: Not on file  Intimate Partner Violence: Not on file    Past Surgical History:  Procedure Laterality Date   CARDIAC CATHETERIZATION     CAROTID PTA/STENT INTERVENTION Left 11/08/2021   Procedure: CAROTID PTA/STENT INTERVENTION;  Surgeon: Katha Cabal, MD;  Location: [facility];  Service: Cardiovascular;  Laterality: Left;   COLONOSCOPY WITH PROPOFOL N/A 01/01/2018   Procedure: COLONOSCOPY WITH  PROPOFOL;  Surgeon: Jonathon Bellows, MD;  Location: [facility] ENDOSCOPY;  Service: Gastroenterology;  Laterality: N/A;   CORONARY ARTERY BYPASS GRAFT  08/2017   4 vessels   ESOPHAGOGASTRODUODENOSCOPY (EGD) WITH PROPOFOL N/A 10/28/2015   Procedure: ESOPHAGOGASTRODUODENOSCOPY (EGD) WITH PROPOFOL;  Surgeon: Hulen Luster, MD;  Location: [facility] ENDOSCOPY;  Service: Gastroenterology;  Laterality: N/A;   KNEE ARTHROSCOPY     LEFT HEART CATH AND CORONARY ANGIOGRAPHY N/A 07/25/2017   Procedure: LEFT HEART CATH AND CORONARY ANGIOGRAPHY;  Surgeon: Teodoro Spray, MD;  Location: [facility];  Service: Cardiovascular;  Laterality: N/A;   NASAL SINUS SURGERY     PARTIAL KNEE ARTHROPLASTY Right 06/27/2019   Procedure: UNICOMPARTMENTAL KNEE;  Surgeon: Corky Mull, MD;  Location: [facility]S;  Service: Orthopedics;  Laterality: Right;   TOTAL KNEE ARTHROPLASTY Left 12/19/2018   Procedure: TOTAL KNEE ARTHROPLASTY;  Surgeon: Corky Mull, MD;  Location: [facility];  Service: Orthopedics;  Laterality: Left;   VASECTOMY      Family History  Problem Relation Age of Onset   Heart attack Mother    Lung disease Father     No Known Allergies     Latest Ref Rng & Units 11/09/2021    4:17 AM 07/25/2019   11:17 AM 06/25/2019    9:07 AM  CBC  WBC 4.0 - 10.5 K/uL 6.1  7.3  7.6   Hemoglobin 13.0 - 17.0 g/dL 13.6  14.1  13.4   Hematocrit 39.0 - 52.0 % 39.3  43.2  42.3   Platelets 150 - 400 K/uL 185  306  246       CMP     Component Value Date/Time   NA 139 11/09/2021 0417   K 3.6 11/09/2021 0417   CL 111 11/09/2021 0417   CO2 24 11/09/2021 0417   GLUCOSE 133 (H) 11/09/2021 0417   BUN 13 11/09/2021 0417   CREATININE 0.80 11/09/2021 0417   CALCIUM 8.6 (L) 11/09/2021 0417   PROT 8.1 07/25/2019 1117   ALBUMIN 4.2 07/25/2019 1117   AST 24 07/25/2019 1117   ALT 30 07/25/2019 1117   ALKPHOS 140 (H) 07/25/2019 1117   BILITOT 0.6 07/25/2019 1117   GFRNONAA >60 11/09/2021 0417   GFRAA >60 07/25/2019 1117     No  results found.     Assessment & Plan:   1. Bilateral carotid artery  stenosis Recommend:   The patient is s/p successful left carotid stent   Duplex ultrasound preoperatively shows 40-59% contralateral stenosis.   Continue antiplatelet therapy as prescribed Continue management of CAD, HTN and Hyperlipidemia Healthy heart diet,  encouraged exercise at least 4 times per week   Follow up in 6 months with duplex ultrasound and physical exam based on the patient's carotid stenting  2. Mixed hyperlipidemia Continue statin as ordered and reviewed, no changes at this time   3. Benign essential HTN Continue antihypertensive medications as already ordered, these medications have been reviewed and there are no changes at this time.    Current Outpatient Medications on File Prior to Visit  Medication Sig Dispense Refill   atorvastatin (LIPITOR) 80 MG tablet Take 80 mg by mouth at bedtime.      ELIQUIS 5 MG TABS tablet Take 5 mg by mouth 2 (two) times daily.     levothyroxine (SYNTHROID, LEVOTHROID) 150 MCG tablet Take 150 mcg by mouth daily before breakfast.     metoprolol tartrate (LOPRESSOR) 25 MG tablet Take 25 mg by mouth 2 (two) times daily.     Omega-3 Fatty Acids (FISH OIL) 1200 MG CAPS Take 1,200 mg by mouth 2 (two) times a day.      omeprazole (PRILOSEC) 20 MG capsule Take 20 mg by mouth every morning.      tamsulosin (FLOMAX) 0.4 MG CAPS capsule Take 2 capsules (0.8 mg total) by mouth at bedtime. 60 capsule 1   aspirin EC 81 MG tablet Take 1 tablet (81 mg total) by mouth daily. (Patient not taking: Reported on 03/13/2022) 30 tablet 12   clopidogrel (PLAVIX) 75 MG tablet Take 1 tablet (75 mg total) by mouth daily. (Patient not taking: Reported on 03/13/2022) 30 tablet 0   fluticasone (FLONASE) 50 MCG/ACT nasal spray Place 1 spray into both nostrils as needed.  (Patient not taking: Reported on 10/10/2021)     furosemide (LASIX) 40 MG tablet Take 1 tablet by mouth daily. (Patient not  taking: Reported on 11/08/2021)     ibuprofen (ADVIL) 800 MG tablet Take 800 mg by mouth every 6 (six) hours as needed. (Patient not taking: Reported on 11/08/2021)     No current facility-administered medications on file prior to visit.    There are no Patient Instructions on file for this visit. No follow-ups on file.   Kris Hartmann, NP

## 2022-03-30 DIAGNOSIS — E7800 Pure hypercholesterolemia, unspecified: Secondary | ICD-10-CM | POA: Diagnosis not present

## 2022-03-30 DIAGNOSIS — I480 Paroxysmal atrial fibrillation: Secondary | ICD-10-CM | POA: Diagnosis not present

## 2022-03-30 DIAGNOSIS — I639 Cerebral infarction, unspecified: Secondary | ICD-10-CM | POA: Diagnosis not present

## 2022-03-30 DIAGNOSIS — I2510 Atherosclerotic heart disease of native coronary artery without angina pectoris: Secondary | ICD-10-CM | POA: Diagnosis not present

## 2022-03-30 DIAGNOSIS — E669 Obesity, unspecified: Secondary | ICD-10-CM | POA: Diagnosis not present

## 2022-03-30 DIAGNOSIS — I10 Essential (primary) hypertension: Secondary | ICD-10-CM | POA: Diagnosis not present

## 2022-04-03 ENCOUNTER — Encounter (INDEPENDENT_AMBULATORY_CARE_PROVIDER_SITE_OTHER): Payer: Self-pay

## 2022-04-05 ENCOUNTER — Other Ambulatory Visit: Payer: Self-pay

## 2022-04-05 ENCOUNTER — Emergency Department: Payer: Medicare HMO

## 2022-04-05 ENCOUNTER — Emergency Department
Admission: EM | Admit: 2022-04-05 | Discharge: 2022-04-05 | Disposition: A | Discharge status: Home / Self Care | Payer: Medicare HMO | Attending: Emergency Medicine | Admitting: Emergency Medicine

## 2022-04-05 DIAGNOSIS — Z7901 Long term (current) use of anticoagulants: Secondary | ICD-10-CM | POA: Diagnosis not present

## 2022-04-05 DIAGNOSIS — I4892 Unspecified atrial flutter: Secondary | ICD-10-CM | POA: Diagnosis not present

## 2022-04-05 DIAGNOSIS — I10 Essential (primary) hypertension: Secondary | ICD-10-CM | POA: Insufficient documentation

## 2022-04-05 DIAGNOSIS — G459 Transient cerebral ischemic attack, unspecified: Secondary | ICD-10-CM | POA: Diagnosis not present

## 2022-04-05 DIAGNOSIS — R202 Paresthesia of skin: Secondary | ICD-10-CM

## 2022-04-05 DIAGNOSIS — R200 Anesthesia of skin: Secondary | ICD-10-CM | POA: Diagnosis present

## 2022-04-05 DIAGNOSIS — I6381 Other cerebral infarction due to occlusion or stenosis of small artery: Secondary | ICD-10-CM

## 2022-04-05 HISTORY — DX: Other cerebral infarction due to occlusion or stenosis of small artery: I63.81

## 2022-04-05 LAB — CBC WITH DIFFERENTIAL/PLATELET
Abs Immature Granulocytes: 0.02 10*3/uL (ref 0.00–0.07)
Basophils Absolute: 0 10*3/uL (ref 0.0–0.1)
Basophils Relative: 0 %
Eosinophils Absolute: 0.2 10*3/uL (ref 0.0–0.5)
Eosinophils Relative: 3 %
HCT: 47.5 % (ref 39.0–52.0)
Hemoglobin: 16.1 g/dL (ref 13.0–17.0)
Immature Granulocytes: 0 %
Lymphocytes Relative: 22 %
Lymphs Abs: 1.6 10*3/uL (ref 0.7–4.0)
MCH: 31.6 pg (ref 26.0–34.0)
MCHC: 33.9 g/dL (ref 30.0–36.0)
MCV: 93.1 fL (ref 80.0–100.0)
Monocytes Absolute: 0.5 10*3/uL (ref 0.1–1.0)
Monocytes Relative: 7 %
Neutro Abs: 4.9 10*3/uL (ref 1.7–7.7)
Neutrophils Relative %: 68 %
Platelets: 210 10*3/uL (ref 150–400)
RBC: 5.1 MIL/uL (ref 4.22–5.81)
RDW: 13.9 % (ref 11.5–15.5)
WBC: 7.1 10*3/uL (ref 4.0–10.5)
nRBC: 0 % (ref 0.0–0.2)

## 2022-04-05 LAB — BASIC METABOLIC PANEL
Anion gap: 5 (ref 5–15)
BUN: 21 mg/dL (ref 8–23)
CO2: 29 mmol/L (ref 22–32)
Calcium: 9.2 mg/dL (ref 8.9–10.3)
Chloride: 105 mmol/L (ref 98–111)
Creatinine, Ser: 1.22 mg/dL (ref 0.61–1.24)
GFR, Estimated: 60 mL/min — ABNORMAL LOW (ref >60)
Glucose, Bld: 135 mg/dL — ABNORMAL HIGH (ref 70–99)
Potassium: 4.3 mmol/L (ref 3.5–5.1)
Sodium: 139 mmol/L (ref 135–145)

## 2022-04-05 LAB — MAGNESIUM: Magnesium: 2 mg/dL (ref 1.7–2.4)

## 2022-04-05 MED ORDER — STROKE: EARLY STAGES OF RECOVERY BOOK
Freq: Once | Status: AC
Start: 2022-04-05 — End: 2022-04-05

## 2022-04-05 MED ORDER — ASPIRIN 81 MG PO CHEW
324.0000 mg | CHEWABLE_TABLET | Freq: Once | ORAL | Status: AC
  Administered 2022-04-05: 324 mg via ORAL
  Filled 2022-04-05: qty 4

## 2022-04-05 NOTE — Plan of Care (Addendum)
Discussed with Dr. Joni Fears.  81 year old male with transient facial numbness that has now resolved.  No motor involvement or other symptoms during the symptomatic period.   He just recently had a lipid panel showing his LDL was less than 70, MRI shows small vessel ischemic stroke.  He is on Eliquis.  At this point, given that it is a small vessel ischemic stroke and his lipids are at baseline, I am not sure that he would gain much benefit from admission since he has returned to baseline.  He will need to continue working on risk factors including blood pressure control, cholesterol control, and diabetes control.  It is a small stroke, but I would favor holding his Eliquis for a few days, could restart Saturday morning, and treat with aspirin '325mg'$  daily in the meantime.   He can follow-up with his regular neurologist.  Total of 11 minutes was spent in chart review/discussion of this patient with > 50% in discussion.   Roland Rack, MD [facility] [phone]  If 7pm- 7am, please page neurology on call as listed in [place].

## 2022-04-05 NOTE — ED Triage Notes (Signed)
Pt states that he had some numbness to his right forehead and nose area, pt also states that he had some numbness to the right 3 fingers but states that he has that frequently due to previous injury, pt states that it hasn't completely resolved and was concerned due to his past hx of tia and stroke, pt is currently on eliquis and reports a hx of a.fib. pt states that his symptoms started around 1700 yesterday

## 2022-04-05 NOTE — ED Provider Notes (Addendum)
[facility] Provider Note    Event Date/Time   First MD Initiated Contact with Patient 04/05/22 [id number]     (approximate)   History   Chief Complaint: Numbness   HPI  JARMARCUS WAMBOLD is a 81 y.o. male with a past history of hypertension, prior stroke, GERD who comes the ED complaining of altered sensation of the right side of the face at the forehead, cheek and chin.  No headache or vision change.  No motor weakness or change in balance or coordination.  No chest pain shortness of breath fever neck stiffness or other complaints.  He does note that he has chronic paresthesia in his right hand due to a previous injury.  He also notes that he has chronic numbness of the right side of his nose due to a dermatological procedure in that area several months ago.  He is not able to specifically identify how his symptoms have changed or what new symptoms he is having today, other than he just feels "different."  He had an episode of the symptoms last night at about 5:00 PM which resolved, and then had recurrence of the symptoms this morning which have also resolved at this point.       Physical Exam   Triage Vital Signs: ED Triage Vitals [04/05/22 0859]  Enc Vitals Group     BP (!) 144/68     Pulse Rate (!) 50     Resp 16     Temp 97.6 F (36.4 C)     Temp Source Oral     SpO2 98 %     Weight 170 lb (77.1 kg)     Height '5\' 5"'$  (1.651 m)     Head Circumference      Peak Flow      Pain Score 0     Pain Loc      Pain Edu?      Excl. in [place]?     Most recent vital signs: Vitals:   04/05/22 1245 04/05/22 1313  BP: 127/75   Pulse: 63   Resp: 18   Temp:  (!) 97.5 F (36.4 C)  SpO2: 97%     General: Awake, no distress.  CV:  Good peripheral perfusion.  Irregular rhythm, rate of 60, atrial flutter on the monitor Resp:  Normal effort.  Clear to auscultation bilaterally Abd:  No distention.  Soft nontender Other:  Cranial nerves III through XII intact  on my exam.  No drift.  Normal orientation and language.  Cerebellar function intact.  NIH stroke scale 0   ED Results / Procedures / Treatments   Labs (all labs ordered are listed, but only abnormal results are displayed) Labs Reviewed  BASIC METABOLIC PANEL - Abnormal; Notable for the following components:      Result Value   Glucose, Bld 135 (*)    GFR, Estimated 60 (*)    All other components within normal limits  CBC WITH DIFFERENTIAL/PLATELET  MAGNESIUM     EKG Interpreted by me Atrial flutter, ventricular rate of 61.  Normal axis, normal intervals.  Normal QRS ST segments and T waves.  No ischemic changes.   RADIOLOGY MRI brain interpreted by me, shows acute infarct in thalamus, confirmed by radiology.   PROCEDURES:  Procedures   MEDICATIONS ORDERED IN ED: Medications   stroke: early stages of recovery book ( Does not apply Given 04/05/22 1315)  aspirin chewable tablet 324 mg (324 mg Oral Given 04/05/22 1313)  IMPRESSION / MDM / ASSESSMENT AND PLAN / ED COURSE  I reviewed the triage vital signs and the nursing notes.                              Differential diagnosis includes, but is not limited to, TIA/ischemic stroke, chronic paresthesia, electrolyte abnormality, anemia  Patient's presentation is most consistent with acute presentation with potential threat to life or bodily function.  Patient has permanent atrial flutter and is on Eliquis which she has been compliant.  He is having some vague, very minimal potential stroke symptoms which have completely resolved.  With his age and comorbidities, I will obtain an MRI, if negative he will be cleared to be discharged and follow-up with primary care.  Doubt intracranial hemorrhage, meningitis encephalitis glaucoma, dissection  ----------------------------------------- 1:05 PM on 04/05/2022 ----------------------------------------- MRI shows acute thalamic infarct. Pt remains asymptomatic.  Since he has  returned to his baseline, he has no acute rehab needs and does not require hospitalization. PT, OT, or speech/cognitive evaluation are not needed since pt symptoms have all completely resolved back to previous baseline.  D/w neurology Dr. Leonel Ramsay who agrees with outpatient management, recommends holding Eliquis for 3 days and replacing with aspirin '324mg'$  in the meantime.         FINAL CLINICAL IMPRESSION(S) / ED DIAGNOSES   Final diagnoses:  Paresthesia  Atrial flutter, unspecified type ([place])     Rx / DC Orders   ED Discharge Orders     None        Note:  This document was prepared using Dragon voice recognition software and may include unintentional dictation errors.   Carrie Mew, MD 04/05/22 1309    Carrie Mew, MD 04/05/22 1311    Carrie Mew, MD 04/05/22 1318

## 2022-04-05 NOTE — Discharge Instructions (Addendum)
Your MRI shows a small stroke causing your facial numbness symptoms.  You should stop taking Eliquis for the next 3 days, and resume taking it on Saturday.  Continue taking aspirin '324mg'$  (4 baby aspirin) once a day tomorrow and Friday, and then stop taking aspirin when you resume Eliquis on Saturday.    Since this happened despite anticoagulation, you should focus on any remaining lifestyle modification factors, including abstaining from any tobacco products and decreasing consumption of meat and animal products in your diet.

## 2022-04-05 NOTE — ED Notes (Signed)
First Nurse: Pt c/o rt sided facial numbness that began yesterday around 5pm, pt also reports numbness in the middle, ring and pinky finger on rt hand. Pt has no other deficits. LVO-, on eliquis.

## 2022-04-05 NOTE — ED Notes (Signed)
Patient completing MRI screening at this time.

## 2022-04-05 NOTE — ED Notes (Signed)
Lab called at this time due to BMP and mag still "in process" for approx 2 hours.

## 2022-04-10 DIAGNOSIS — D2261 Melanocytic nevi of right upper limb, including shoulder: Secondary | ICD-10-CM | POA: Diagnosis not present

## 2022-04-10 DIAGNOSIS — D2271 Melanocytic nevi of right lower limb, including hip: Secondary | ICD-10-CM | POA: Diagnosis not present

## 2022-04-10 DIAGNOSIS — L570 Actinic keratosis: Secondary | ICD-10-CM | POA: Diagnosis not present

## 2022-04-10 DIAGNOSIS — D2262 Melanocytic nevi of left upper limb, including shoulder: Secondary | ICD-10-CM | POA: Diagnosis not present

## 2022-04-10 DIAGNOSIS — Z09 Encounter for follow-up examination after completed treatment for conditions other than malignant neoplasm: Secondary | ICD-10-CM | POA: Diagnosis not present

## 2022-04-10 DIAGNOSIS — Z85828 Personal history of other malignant neoplasm of skin: Secondary | ICD-10-CM | POA: Diagnosis not present

## 2022-04-13 DIAGNOSIS — I10 Essential (primary) hypertension: Secondary | ICD-10-CM | POA: Diagnosis not present

## 2022-04-13 DIAGNOSIS — Z8673 Personal history of transient ischemic attack (TIA), and cerebral infarction without residual deficits: Secondary | ICD-10-CM | POA: Diagnosis not present

## 2022-04-13 DIAGNOSIS — I480 Paroxysmal atrial fibrillation: Secondary | ICD-10-CM | POA: Diagnosis not present

## 2022-04-14 ENCOUNTER — Telehealth: Payer: Self-pay

## 2022-04-14 NOTE — Telephone Encounter (Signed)
     Patient  visit on 11/1  at [place] you been able to follow up with your primary care physician? YES  The patient was or was not able to obtain any needed medicine or equipment. YES  Are there diet recommendations that you are having difficulty following? NA  Patient expresses understanding of discharge instructions and education provided has no other needs at this time. [place], [facility], Care Management  [phone] [address], [place], [place] [address] Phone: [phone] Email: [email or web address]

## 2022-05-01 DIAGNOSIS — M79675 Pain in left toe(s): Secondary | ICD-10-CM | POA: Diagnosis not present

## 2022-05-01 DIAGNOSIS — M2011 Hallux valgus (acquired), right foot: Secondary | ICD-10-CM | POA: Diagnosis not present

## 2022-05-01 DIAGNOSIS — M79672 Pain in left foot: Secondary | ICD-10-CM | POA: Diagnosis not present

## 2022-05-01 DIAGNOSIS — Z7901 Long term (current) use of anticoagulants: Secondary | ICD-10-CM | POA: Diagnosis not present

## 2022-05-01 DIAGNOSIS — R7303 Prediabetes: Secondary | ICD-10-CM | POA: Diagnosis not present

## 2022-05-01 DIAGNOSIS — B351 Tinea unguium: Secondary | ICD-10-CM | POA: Diagnosis not present

## 2022-05-01 DIAGNOSIS — M79671 Pain in right foot: Secondary | ICD-10-CM | POA: Diagnosis not present

## 2022-05-01 DIAGNOSIS — M2012 Hallux valgus (acquired), left foot: Secondary | ICD-10-CM | POA: Diagnosis not present

## 2022-05-01 DIAGNOSIS — M79674 Pain in right toe(s): Secondary | ICD-10-CM | POA: Diagnosis not present

## 2022-05-16 DIAGNOSIS — I350 Nonrheumatic aortic (valve) stenosis: Secondary | ICD-10-CM

## 2022-05-16 DIAGNOSIS — I480 Paroxysmal atrial fibrillation: Secondary | ICD-10-CM | POA: Diagnosis not present

## 2022-05-16 HISTORY — DX: Nonrheumatic aortic (valve) stenosis: I35.0

## 2022-05-22 DIAGNOSIS — I350 Nonrheumatic aortic (valve) stenosis: Secondary | ICD-10-CM | POA: Diagnosis not present

## 2022-05-22 DIAGNOSIS — I10 Essential (primary) hypertension: Secondary | ICD-10-CM | POA: Diagnosis not present

## 2022-05-22 DIAGNOSIS — Z79899 Other long term (current) drug therapy: Secondary | ICD-10-CM | POA: Diagnosis not present

## 2022-05-22 DIAGNOSIS — I480 Paroxysmal atrial fibrillation: Secondary | ICD-10-CM | POA: Diagnosis not present

## 2022-05-22 DIAGNOSIS — E039 Hypothyroidism, unspecified: Secondary | ICD-10-CM | POA: Diagnosis not present

## 2022-05-22 DIAGNOSIS — E7800 Pure hypercholesterolemia, unspecified: Secondary | ICD-10-CM | POA: Diagnosis not present

## 2022-05-22 DIAGNOSIS — R739 Hyperglycemia, unspecified: Secondary | ICD-10-CM | POA: Diagnosis not present

## 2022-06-22 DIAGNOSIS — E7800 Pure hypercholesterolemia, unspecified: Secondary | ICD-10-CM | POA: Diagnosis not present

## 2022-06-22 DIAGNOSIS — I10 Essential (primary) hypertension: Secondary | ICD-10-CM | POA: Diagnosis not present

## 2022-06-22 DIAGNOSIS — Z79899 Other long term (current) drug therapy: Secondary | ICD-10-CM | POA: Diagnosis not present

## 2022-06-22 DIAGNOSIS — E039 Hypothyroidism, unspecified: Secondary | ICD-10-CM | POA: Diagnosis not present

## 2022-06-22 DIAGNOSIS — R7309 Other abnormal glucose: Secondary | ICD-10-CM | POA: Diagnosis not present

## 2022-06-23 DIAGNOSIS — E538 Deficiency of other specified B group vitamins: Secondary | ICD-10-CM | POA: Diagnosis not present

## 2022-06-23 DIAGNOSIS — D432 Neoplasm of uncertain behavior of brain, unspecified: Secondary | ICD-10-CM | POA: Diagnosis not present

## 2022-06-23 DIAGNOSIS — R519 Headache, unspecified: Secondary | ICD-10-CM | POA: Diagnosis not present

## 2022-06-23 DIAGNOSIS — I69319 Unspecified symptoms and signs involving cognitive functions following cerebral infarction: Secondary | ICD-10-CM | POA: Diagnosis not present

## 2022-07-07 DIAGNOSIS — I361 Nonrheumatic tricuspid (valve) insufficiency: Secondary | ICD-10-CM | POA: Diagnosis not present

## 2022-07-07 DIAGNOSIS — I350 Nonrheumatic aortic (valve) stenosis: Secondary | ICD-10-CM | POA: Diagnosis not present

## 2022-07-07 DIAGNOSIS — I2510 Atherosclerotic heart disease of native coronary artery without angina pectoris: Secondary | ICD-10-CM | POA: Diagnosis not present

## 2022-07-07 DIAGNOSIS — I10 Essential (primary) hypertension: Secondary | ICD-10-CM | POA: Diagnosis not present

## 2022-07-07 DIAGNOSIS — I480 Paroxysmal atrial fibrillation: Secondary | ICD-10-CM | POA: Diagnosis not present

## 2022-08-21 DIAGNOSIS — H2513 Age-related nuclear cataract, bilateral: Secondary | ICD-10-CM | POA: Diagnosis not present

## 2022-08-30 ENCOUNTER — Other Ambulatory Visit (INDEPENDENT_AMBULATORY_CARE_PROVIDER_SITE_OTHER): Payer: Self-pay | Admitting: Nurse Practitioner

## 2022-08-30 DIAGNOSIS — I6523 Occlusion and stenosis of bilateral carotid arteries: Secondary | ICD-10-CM

## 2022-09-11 ENCOUNTER — Encounter (INDEPENDENT_AMBULATORY_CARE_PROVIDER_SITE_OTHER): Payer: Self-pay | Admitting: Vascular Surgery

## 2022-09-11 ENCOUNTER — Ambulatory Visit (INDEPENDENT_AMBULATORY_CARE_PROVIDER_SITE_OTHER): Payer: Medicare HMO

## 2022-09-11 ENCOUNTER — Ambulatory Visit (INDEPENDENT_AMBULATORY_CARE_PROVIDER_SITE_OTHER): Payer: Medicare HMO | Admitting: Vascular Surgery

## 2022-09-11 VITALS — BP 148/66 | HR 46 | Resp 16 | Ht 64.0 in | Wt 179.6 lb

## 2022-09-11 DIAGNOSIS — I2510 Atherosclerotic heart disease of native coronary artery without angina pectoris: Secondary | ICD-10-CM

## 2022-09-11 DIAGNOSIS — I10 Essential (primary) hypertension: Secondary | ICD-10-CM | POA: Diagnosis not present

## 2022-09-11 DIAGNOSIS — I6523 Occlusion and stenosis of bilateral carotid arteries: Secondary | ICD-10-CM

## 2022-09-11 DIAGNOSIS — E7800 Pure hypercholesterolemia, unspecified: Secondary | ICD-10-CM

## 2022-09-11 NOTE — Progress Notes (Signed)
MRN : [id number]  Warren Ingram is a 82 y.o. (24-Nov-1940) male who presents with chief complaint of check carotid arteries.  History of Present Illness:   The patient is seen for follow up evaluation of carotid stenosis status post left carotid stent on 11/08/2021.  There were no post operative problems or complications related to the surgery.  The patient denies neck or incisional pain.   The patient denies interval amaurosis fugax. There is no recent history of TIA symptoms or focal motor deficits. There is no prior documented CVA.   The patient denies headache.   The patient is taking enteric-coated aspirin 81 mg daily.   No recent shortening of the patient's walking distance or new symptoms consistent with claudication.  No history of rest pain symptoms. No new ulcers or wounds of the lower extremities have occurred.   There is no history of DVT, PE or superficial thrombophlebitis. No recent episodes of angina or shortness of breath documented.    Today noninvasive studies show 1 to 39% stenosis of the left ICA.  The stent is patent.  40 to 59% stenosis of the right ICA.  The bilateral vertebral arteries have antegrade flow with normal flow hemodynamics in the bilateral subclavian arteries.  Current Meds  Medication Sig   acetaminophen (TYLENOL) 325 MG tablet Take 325 mg by mouth.   amLODipine (NORVASC) 5 MG tablet Take 5 mg by mouth daily.   atorvastatin (LIPITOR) 80 MG tablet Take 80 mg by mouth at bedtime.    clopidogrel (PLAVIX) 75 MG tablet Take 1 tablet (75 mg total) by mouth daily.   ELIQUIS 5 MG TABS tablet Take 5 mg by mouth 2 (two) times daily.   fluticasone (FLONASE) 50 MCG/ACT nasal spray Place 1 spray into both nostrils as needed.   levothyroxine (SYNTHROID, LEVOTHROID) 150 MCG tablet Take 150 mcg by mouth daily before breakfast.   lisinopril (ZESTRIL) 10 MG tablet Take 10 mg by mouth daily.   metoprolol tartrate (LOPRESSOR) 25 MG tablet Take 25 mg by  mouth 2 (two) times daily.   Omega-3 Fatty Acids (FISH OIL) 1200 MG CAPS Take 1,200 mg by mouth 2 (two) times a day.    omeprazole (PRILOSEC) 20 MG capsule Take 20 mg by mouth every morning.    tamsulosin (FLOMAX) 0.4 MG CAPS capsule Take 2 capsules (0.8 mg total) by mouth at bedtime.   triamcinolone cream (KENALOG) 0.5 % Apply topically 2 (two) times daily.    Past Medical History:  Diagnosis Date   Arthritis    Barrett esophagus    Benign neoplasm of abdomen    Benign neoplasm of colon    Cancer    skin cancer   Coronary artery disease    GERD (gastroesophageal reflux disease)    Heart murmur    ASYMPTOMATIC   History of hiatal hernia    Hypercholesterolemia    Hypertension    Hypothyroidism    Stroke 2015   mini stroke    Past Surgical History:  Procedure Laterality Date   CARDIAC CATHETERIZATION     CAROTID PTA/STENT INTERVENTION Left 11/08/2021   Procedure: CAROTID PTA/STENT INTERVENTION;  Surgeon: Renford DillsSchnier, Nelline Lio G, MD;  Location: [facility] INVASIVE CV LAB;  Service: Cardiovascular;  Laterality: Left;   COLONOSCOPY WITH PROPOFOL N/A 01/01/2018   Procedure: COLONOSCOPY WITH PROPOFOL;  Surgeon: Wyline MoodAnna, Kiran, MD;  [facility];  Service: Gastroenterology;  Laterality: N/A;   CORONARY ARTERY BYPASS GRAFT  08/2017   4 vessels  ESOPHAGOGASTRODUODENOSCOPY (EGD) WITH PROPOFOL N/A 10/28/2015   Procedure: ESOPHAGOGASTRODUODENOSCOPY (EGD) WITH PROPOFOL;  Surgeon: Wallace Cullens, MD;  Location: [facility] ENDOSCOPY;  Service: Gastroenterology;  Laterality: N/A;   KNEE ARTHROSCOPY     LEFT HEART CATH AND CORONARY ANGIOGRAPHY N/A 07/25/2017   Procedure: LEFT HEART CATH AND CORONARY ANGIOGRAPHY;  Surgeon: Dalia Heading, MD;  Location: [facility] INVASIVE CV LAB;  Service: Cardiovascular;  Laterality: N/A;   NASAL SINUS SURGERY     PARTIAL KNEE ARTHROPLASTY Right 06/27/2019   Procedure: UNICOMPARTMENTAL KNEE;  Surgeon: Christena Flake, MD;  Location: [facility] ORS;  Service: Orthopedics;  Laterality:  Right;   TOTAL KNEE ARTHROPLASTY Left 12/19/2018   Procedure: TOTAL KNEE ARTHROPLASTY;  Surgeon: Christena Flake, MD;  Location: [facility] ORS;  Service: Orthopedics;  Laterality: Left;   VASECTOMY      Social History Social History   Tobacco Use   Smoking status: Former    Packs/day: 1.00    Years: 15.00    Additional pack years: 0.00    Total pack years: 15.00    Types: Cigarettes    Quit date: 06/06/1971    Years since quitting: 51.3   Smokeless tobacco: Former  Building services engineer Use: Never used  Substance Use Topics   Alcohol use: Yes    Alcohol/week: 2.0 standard drinks of alcohol    Types: 1 Cans of beer, 1 Shots of liquor per week    Comment: 4 to 5 times per week   Drug use: No    Family History Family History  Problem Relation Age of Onset   Heart attack Mother    Lung disease Father     No Known Allergies   REVIEW OF SYSTEMS (Negative unless checked)  Constitutional: [] Weight loss  [] Fever  [] Chills Cardiac: [] Chest pain   [] Chest pressure   [] Palpitations   [] Shortness of breath when laying flat   [] Shortness of breath with exertion. Vascular:  [x] Pain in legs with walking   [] Pain in legs at rest  [] History of DVT   [] Phlebitis   [] Swelling in legs   [] Varicose veins   [] Non-healing ulcers Pulmonary:   [] Uses home oxygen   [] Productive cough   [] Hemoptysis   [] Wheeze  [] COPD   [] Asthma Neurologic:  [] Dizziness   [] Seizures   [x] History of stroke   [] History of TIA  [] Aphasia   [] Vissual changes   [] Weakness or numbness in arm   [] Weakness or numbness in leg Musculoskeletal:   [] Joint swelling   [x] Joint pain   [] Low back pain Hematologic:  [] Easy bruising  [] Easy bleeding   [] Hypercoagulable state   [] Anemic Gastrointestinal:  [] Diarrhea   [] Vomiting  [x] Gastroesophageal reflux/heartburn   [] Difficulty swallowing. Genitourinary:  [] Chronic kidney disease   [] Difficult urination  [] Frequent urination   [] Blood in urine Skin:  [] Rashes   [] Ulcers  Psychological:   [] History of anxiety   []  History of major depression.  Physical Examination  Vitals:   09/11/22 1039 09/11/22 1045  BP: (!) 140/82 (!) 148/66  Pulse: 60 (!) 46  Resp: 18 16  Weight: 179 lb 9.6 oz (81.5 kg)   Height: 5\' 4"  (1.626 m)    Body mass index is 30.83 kg/m. Gen: WD/WN, NAD Head: [place]/AT, No temporalis wasting.  Ear/Nose/Throat: Hearing grossly intact, nares w/o erythema or drainage Eyes: PER, EOMI, sclera nonicteric.  Neck: Supple, no masses.  No bruit or JVD.  Pulmonary:  Good air movement, no audible wheezing, no use of accessory muscles.  Cardiac: RRR, normal S1, S2, no Murmurs. Vascular:  carotid bruit noted Vessel Right Left  Radial Palpable Palpable  Carotid  Palpable  Palpable  Subclav  Palpable Palpable  Gastrointestinal: soft, non-distended. No guarding/no peritoneal signs.  Musculoskeletal: M/S 5/5 throughout.  No visible deformity.  Neurologic: CN 2-12 intact. Pain and light touch intact in extremities.  Symmetrical.  Speech is fluent. Motor exam as listed above. Psychiatric: Judgment intact, Mood & affect appropriate for pt's clinical situation. Dermatologic: No rashes or ulcers noted.  No changes consistent with cellulitis.   CBC Lab Results  Component Value Date   WBC 7.1 04/05/2022   HGB 16.1 04/05/2022   HCT 47.5 04/05/2022   MCV 93.1 04/05/2022   PLT 210 04/05/2022    BMET    Component Value Date/Time   NA 139 04/05/2022 0925   K 4.3 04/05/2022 0925   CL 105 04/05/2022 0925   CO2 29 04/05/2022 0925   GLUCOSE 135 (H) 04/05/2022 0925   BUN 21 04/05/2022 0925   CREATININE 1.22 04/05/2022 0925   CALCIUM 9.2 04/05/2022 0925   GFRNONAA 60 (L) 04/05/2022 0925   GFRAA >60 07/25/2019 1117   CrCl cannot be calculated (Patient's most recent lab result is older than the maximum 21 days allowed.).  COAG Lab Results  Component Value Date   INR 1.0 07/25/2019   INR 0.97 12/30/2017    Radiology No results found.   Assessment/Plan 1.  Bilateral carotid artery stenosis Recommend:  Given the patient's asymptomatic subcritical stenosis no further invasive testing or surgery at this time.  Duplex ultrasound shows  1-39% stenosis of the left ICA.  The stent is patent.  40-59% stenosis of the right ICA.  The bilateral vertebral arteries have antegrade flow with normal flow hemodynamics in the bilateral subclavian arteries..  Continue antiplatelet therapy as prescribed Continue management of CAD, HTN and Hyperlipidemia Healthy heart diet,  encouraged exercise at least 4 times per week Follow up in 12 months with duplex ultrasound and physical exam.   - VAS US CAROTID - VAS US CAROTID; Future  2. Symptomatic stenosis of both carotid arteries See #1 - VAS US CAROTID; Future  3. Benign essential HTN Continue antihypertensive medications as already ordered, these medications have been reviewed and there are no changes at this time.  4. CAD in native artery Continue cardiac and antihypertensive medications as already ordered and reviewed, no changes at this time.  Continue statin as ordered and reviewed, no changes at this time  Nitrates PRN for chest pain  5. Pure hypercholesterolemia Continue statin as ordered and reviewed, no changes at this time     Levora Dredge, MD  09/11/2022 10:56 AM

## 2022-09-25 DIAGNOSIS — I480 Paroxysmal atrial fibrillation: Secondary | ICD-10-CM | POA: Diagnosis not present

## 2022-09-25 DIAGNOSIS — E079 Disorder of thyroid, unspecified: Secondary | ICD-10-CM | POA: Diagnosis not present

## 2022-09-25 DIAGNOSIS — Z1211 Encounter for screening for malignant neoplasm of colon: Secondary | ICD-10-CM | POA: Diagnosis not present

## 2022-09-25 DIAGNOSIS — R7303 Prediabetes: Secondary | ICD-10-CM | POA: Diagnosis not present

## 2022-09-25 DIAGNOSIS — E785 Hyperlipidemia, unspecified: Secondary | ICD-10-CM | POA: Diagnosis not present

## 2022-09-25 DIAGNOSIS — Z79899 Other long term (current) drug therapy: Secondary | ICD-10-CM | POA: Diagnosis not present

## 2022-09-25 DIAGNOSIS — I10 Essential (primary) hypertension: Secondary | ICD-10-CM | POA: Diagnosis not present

## 2022-09-25 DIAGNOSIS — I2510 Atherosclerotic heart disease of native coronary artery without angina pectoris: Secondary | ICD-10-CM | POA: Diagnosis not present

## 2022-09-25 DIAGNOSIS — Z0000 Encounter for general adult medical examination without abnormal findings: Secondary | ICD-10-CM | POA: Diagnosis not present

## 2022-10-17 DIAGNOSIS — I2510 Atherosclerotic heart disease of native coronary artery without angina pectoris: Secondary | ICD-10-CM | POA: Diagnosis not present

## 2022-10-17 DIAGNOSIS — I639 Cerebral infarction, unspecified: Secondary | ICD-10-CM | POA: Diagnosis not present

## 2022-10-17 DIAGNOSIS — E669 Obesity, unspecified: Secondary | ICD-10-CM | POA: Diagnosis not present

## 2022-10-17 DIAGNOSIS — I10 Essential (primary) hypertension: Secondary | ICD-10-CM | POA: Diagnosis not present

## 2022-10-17 DIAGNOSIS — I350 Nonrheumatic aortic (valve) stenosis: Secondary | ICD-10-CM | POA: Diagnosis not present

## 2022-10-17 DIAGNOSIS — Z8673 Personal history of transient ischemic attack (TIA), and cerebral infarction without residual deficits: Secondary | ICD-10-CM | POA: Diagnosis not present

## 2022-10-17 DIAGNOSIS — E7800 Pure hypercholesterolemia, unspecified: Secondary | ICD-10-CM | POA: Diagnosis not present

## 2022-10-17 DIAGNOSIS — I483 Typical atrial flutter: Secondary | ICD-10-CM | POA: Diagnosis not present

## 2022-10-17 DIAGNOSIS — I480 Paroxysmal atrial fibrillation: Secondary | ICD-10-CM | POA: Diagnosis not present

## 2022-10-25 DIAGNOSIS — Z79899 Other long term (current) drug therapy: Secondary | ICD-10-CM | POA: Diagnosis not present

## 2022-11-27 DIAGNOSIS — Z1211 Encounter for screening for malignant neoplasm of colon: Secondary | ICD-10-CM | POA: Diagnosis not present

## 2022-12-14 DIAGNOSIS — D2262 Melanocytic nevi of left upper limb, including shoulder: Secondary | ICD-10-CM | POA: Diagnosis not present

## 2022-12-14 DIAGNOSIS — D225 Melanocytic nevi of trunk: Secondary | ICD-10-CM | POA: Diagnosis not present

## 2022-12-14 DIAGNOSIS — L400 Psoriasis vulgaris: Secondary | ICD-10-CM | POA: Diagnosis not present

## 2022-12-14 DIAGNOSIS — Z09 Encounter for follow-up examination after completed treatment for conditions other than malignant neoplasm: Secondary | ICD-10-CM | POA: Diagnosis not present

## 2022-12-14 DIAGNOSIS — L570 Actinic keratosis: Secondary | ICD-10-CM | POA: Diagnosis not present

## 2022-12-14 DIAGNOSIS — Z85828 Personal history of other malignant neoplasm of skin: Secondary | ICD-10-CM | POA: Diagnosis not present

## 2022-12-14 DIAGNOSIS — D2261 Melanocytic nevi of right upper limb, including shoulder: Secondary | ICD-10-CM | POA: Diagnosis not present

## 2022-12-14 DIAGNOSIS — Z872 Personal history of diseases of the skin and subcutaneous tissue: Secondary | ICD-10-CM | POA: Diagnosis not present

## 2022-12-14 DIAGNOSIS — D2271 Melanocytic nevi of right lower limb, including hip: Secondary | ICD-10-CM | POA: Diagnosis not present

## 2022-12-26 DIAGNOSIS — M955 Acquired deformity of pelvis: Secondary | ICD-10-CM | POA: Diagnosis not present

## 2022-12-26 DIAGNOSIS — M5442 Lumbago with sciatica, left side: Secondary | ICD-10-CM | POA: Diagnosis not present

## 2022-12-26 DIAGNOSIS — M9905 Segmental and somatic dysfunction of pelvic region: Secondary | ICD-10-CM | POA: Diagnosis not present

## 2022-12-26 DIAGNOSIS — M9903 Segmental and somatic dysfunction of lumbar region: Secondary | ICD-10-CM | POA: Diagnosis not present

## 2022-12-26 DIAGNOSIS — M9902 Segmental and somatic dysfunction of thoracic region: Secondary | ICD-10-CM | POA: Diagnosis not present

## 2022-12-26 DIAGNOSIS — M546 Pain in thoracic spine: Secondary | ICD-10-CM | POA: Diagnosis not present

## 2022-12-28 DIAGNOSIS — M546 Pain in thoracic spine: Secondary | ICD-10-CM | POA: Diagnosis not present

## 2022-12-28 DIAGNOSIS — M9902 Segmental and somatic dysfunction of thoracic region: Secondary | ICD-10-CM | POA: Diagnosis not present

## 2022-12-28 DIAGNOSIS — M5442 Lumbago with sciatica, left side: Secondary | ICD-10-CM | POA: Diagnosis not present

## 2022-12-28 DIAGNOSIS — M955 Acquired deformity of pelvis: Secondary | ICD-10-CM | POA: Diagnosis not present

## 2022-12-28 DIAGNOSIS — M9903 Segmental and somatic dysfunction of lumbar region: Secondary | ICD-10-CM | POA: Diagnosis not present

## 2022-12-28 DIAGNOSIS — M9905 Segmental and somatic dysfunction of pelvic region: Secondary | ICD-10-CM | POA: Diagnosis not present

## 2023-01-01 DIAGNOSIS — M5442 Lumbago with sciatica, left side: Secondary | ICD-10-CM | POA: Diagnosis not present

## 2023-01-01 DIAGNOSIS — M955 Acquired deformity of pelvis: Secondary | ICD-10-CM | POA: Diagnosis not present

## 2023-01-01 DIAGNOSIS — M9905 Segmental and somatic dysfunction of pelvic region: Secondary | ICD-10-CM | POA: Diagnosis not present

## 2023-01-01 DIAGNOSIS — M9903 Segmental and somatic dysfunction of lumbar region: Secondary | ICD-10-CM | POA: Diagnosis not present

## 2023-01-01 DIAGNOSIS — M9902 Segmental and somatic dysfunction of thoracic region: Secondary | ICD-10-CM | POA: Diagnosis not present

## 2023-01-01 DIAGNOSIS — M546 Pain in thoracic spine: Secondary | ICD-10-CM | POA: Diagnosis not present

## 2023-01-02 DIAGNOSIS — M955 Acquired deformity of pelvis: Secondary | ICD-10-CM | POA: Diagnosis not present

## 2023-01-02 DIAGNOSIS — M5442 Lumbago with sciatica, left side: Secondary | ICD-10-CM | POA: Diagnosis not present

## 2023-01-02 DIAGNOSIS — M9905 Segmental and somatic dysfunction of pelvic region: Secondary | ICD-10-CM | POA: Diagnosis not present

## 2023-01-02 DIAGNOSIS — M9903 Segmental and somatic dysfunction of lumbar region: Secondary | ICD-10-CM | POA: Diagnosis not present

## 2023-01-02 DIAGNOSIS — M546 Pain in thoracic spine: Secondary | ICD-10-CM | POA: Diagnosis not present

## 2023-01-02 DIAGNOSIS — M9902 Segmental and somatic dysfunction of thoracic region: Secondary | ICD-10-CM | POA: Diagnosis not present

## 2023-01-04 DIAGNOSIS — M9905 Segmental and somatic dysfunction of pelvic region: Secondary | ICD-10-CM | POA: Diagnosis not present

## 2023-01-04 DIAGNOSIS — M955 Acquired deformity of pelvis: Secondary | ICD-10-CM | POA: Diagnosis not present

## 2023-01-04 DIAGNOSIS — M9902 Segmental and somatic dysfunction of thoracic region: Secondary | ICD-10-CM | POA: Diagnosis not present

## 2023-01-04 DIAGNOSIS — M546 Pain in thoracic spine: Secondary | ICD-10-CM | POA: Diagnosis not present

## 2023-01-04 DIAGNOSIS — M5442 Lumbago with sciatica, left side: Secondary | ICD-10-CM | POA: Diagnosis not present

## 2023-01-04 DIAGNOSIS — M9903 Segmental and somatic dysfunction of lumbar region: Secondary | ICD-10-CM | POA: Diagnosis not present

## 2023-01-09 DIAGNOSIS — I350 Nonrheumatic aortic (valve) stenosis: Secondary | ICD-10-CM | POA: Diagnosis not present

## 2023-01-09 DIAGNOSIS — I361 Nonrheumatic tricuspid (valve) insufficiency: Secondary | ICD-10-CM | POA: Diagnosis not present

## 2023-01-10 DIAGNOSIS — M5442 Lumbago with sciatica, left side: Secondary | ICD-10-CM | POA: Diagnosis not present

## 2023-01-10 DIAGNOSIS — M9905 Segmental and somatic dysfunction of pelvic region: Secondary | ICD-10-CM | POA: Diagnosis not present

## 2023-01-10 DIAGNOSIS — M9902 Segmental and somatic dysfunction of thoracic region: Secondary | ICD-10-CM | POA: Diagnosis not present

## 2023-01-10 DIAGNOSIS — M546 Pain in thoracic spine: Secondary | ICD-10-CM | POA: Diagnosis not present

## 2023-01-10 DIAGNOSIS — M955 Acquired deformity of pelvis: Secondary | ICD-10-CM | POA: Diagnosis not present

## 2023-01-10 DIAGNOSIS — M9903 Segmental and somatic dysfunction of lumbar region: Secondary | ICD-10-CM | POA: Diagnosis not present

## 2023-01-24 DIAGNOSIS — Z125 Encounter for screening for malignant neoplasm of prostate: Secondary | ICD-10-CM | POA: Diagnosis not present

## 2023-01-24 DIAGNOSIS — I2510 Atherosclerotic heart disease of native coronary artery without angina pectoris: Secondary | ICD-10-CM | POA: Diagnosis not present

## 2023-01-24 DIAGNOSIS — I10 Essential (primary) hypertension: Secondary | ICD-10-CM | POA: Diagnosis not present

## 2023-01-24 DIAGNOSIS — Z79899 Other long term (current) drug therapy: Secondary | ICD-10-CM | POA: Diagnosis not present

## 2023-01-24 DIAGNOSIS — R7303 Prediabetes: Secondary | ICD-10-CM | POA: Diagnosis not present

## 2023-01-24 DIAGNOSIS — E7800 Pure hypercholesterolemia, unspecified: Secondary | ICD-10-CM | POA: Diagnosis not present

## 2023-01-24 DIAGNOSIS — E039 Hypothyroidism, unspecified: Secondary | ICD-10-CM | POA: Diagnosis not present

## 2023-01-24 DIAGNOSIS — I480 Paroxysmal atrial fibrillation: Secondary | ICD-10-CM | POA: Diagnosis not present

## 2023-01-25 DIAGNOSIS — M9902 Segmental and somatic dysfunction of thoracic region: Secondary | ICD-10-CM | POA: Diagnosis not present

## 2023-01-25 DIAGNOSIS — M9903 Segmental and somatic dysfunction of lumbar region: Secondary | ICD-10-CM | POA: Diagnosis not present

## 2023-01-25 DIAGNOSIS — M5442 Lumbago with sciatica, left side: Secondary | ICD-10-CM | POA: Diagnosis not present

## 2023-01-25 DIAGNOSIS — M955 Acquired deformity of pelvis: Secondary | ICD-10-CM | POA: Diagnosis not present

## 2023-01-25 DIAGNOSIS — M9905 Segmental and somatic dysfunction of pelvic region: Secondary | ICD-10-CM | POA: Diagnosis not present

## 2023-01-25 DIAGNOSIS — M546 Pain in thoracic spine: Secondary | ICD-10-CM | POA: Diagnosis not present

## 2023-02-01 DIAGNOSIS — M546 Pain in thoracic spine: Secondary | ICD-10-CM | POA: Diagnosis not present

## 2023-02-01 DIAGNOSIS — M9902 Segmental and somatic dysfunction of thoracic region: Secondary | ICD-10-CM | POA: Diagnosis not present

## 2023-02-01 DIAGNOSIS — M5442 Lumbago with sciatica, left side: Secondary | ICD-10-CM | POA: Diagnosis not present

## 2023-02-01 DIAGNOSIS — M9903 Segmental and somatic dysfunction of lumbar region: Secondary | ICD-10-CM | POA: Diagnosis not present

## 2023-02-01 DIAGNOSIS — M9905 Segmental and somatic dysfunction of pelvic region: Secondary | ICD-10-CM | POA: Diagnosis not present

## 2023-02-01 DIAGNOSIS — M955 Acquired deformity of pelvis: Secondary | ICD-10-CM | POA: Diagnosis not present

## 2023-02-07 DIAGNOSIS — Z79899 Other long term (current) drug therapy: Secondary | ICD-10-CM | POA: Diagnosis not present

## 2023-02-07 DIAGNOSIS — Z125 Encounter for screening for malignant neoplasm of prostate: Secondary | ICD-10-CM | POA: Diagnosis not present

## 2023-02-07 DIAGNOSIS — R7309 Other abnormal glucose: Secondary | ICD-10-CM | POA: Diagnosis not present

## 2023-02-07 DIAGNOSIS — E039 Hypothyroidism, unspecified: Secondary | ICD-10-CM | POA: Diagnosis not present

## 2023-02-07 DIAGNOSIS — E7800 Pure hypercholesterolemia, unspecified: Secondary | ICD-10-CM | POA: Diagnosis not present

## 2023-02-07 DIAGNOSIS — I10 Essential (primary) hypertension: Secondary | ICD-10-CM | POA: Diagnosis not present

## 2023-02-08 DIAGNOSIS — M5442 Lumbago with sciatica, left side: Secondary | ICD-10-CM | POA: Diagnosis not present

## 2023-02-08 DIAGNOSIS — M9902 Segmental and somatic dysfunction of thoracic region: Secondary | ICD-10-CM | POA: Diagnosis not present

## 2023-02-08 DIAGNOSIS — M955 Acquired deformity of pelvis: Secondary | ICD-10-CM | POA: Diagnosis not present

## 2023-02-08 DIAGNOSIS — M9905 Segmental and somatic dysfunction of pelvic region: Secondary | ICD-10-CM | POA: Diagnosis not present

## 2023-02-08 DIAGNOSIS — M546 Pain in thoracic spine: Secondary | ICD-10-CM | POA: Diagnosis not present

## 2023-02-08 DIAGNOSIS — M9903 Segmental and somatic dysfunction of lumbar region: Secondary | ICD-10-CM | POA: Diagnosis not present

## 2023-02-22 DIAGNOSIS — M5442 Lumbago with sciatica, left side: Secondary | ICD-10-CM | POA: Diagnosis not present

## 2023-02-22 DIAGNOSIS — M9903 Segmental and somatic dysfunction of lumbar region: Secondary | ICD-10-CM | POA: Diagnosis not present

## 2023-02-22 DIAGNOSIS — M9905 Segmental and somatic dysfunction of pelvic region: Secondary | ICD-10-CM | POA: Diagnosis not present

## 2023-02-22 DIAGNOSIS — M9902 Segmental and somatic dysfunction of thoracic region: Secondary | ICD-10-CM | POA: Diagnosis not present

## 2023-02-22 DIAGNOSIS — M546 Pain in thoracic spine: Secondary | ICD-10-CM | POA: Diagnosis not present

## 2023-02-22 DIAGNOSIS — M955 Acquired deformity of pelvis: Secondary | ICD-10-CM | POA: Diagnosis not present

## 2023-03-19 DIAGNOSIS — M9903 Segmental and somatic dysfunction of lumbar region: Secondary | ICD-10-CM | POA: Diagnosis not present

## 2023-03-19 DIAGNOSIS — M9902 Segmental and somatic dysfunction of thoracic region: Secondary | ICD-10-CM | POA: Diagnosis not present

## 2023-03-19 DIAGNOSIS — M9905 Segmental and somatic dysfunction of pelvic region: Secondary | ICD-10-CM | POA: Diagnosis not present

## 2023-03-19 DIAGNOSIS — M955 Acquired deformity of pelvis: Secondary | ICD-10-CM | POA: Diagnosis not present

## 2023-03-19 DIAGNOSIS — M546 Pain in thoracic spine: Secondary | ICD-10-CM | POA: Diagnosis not present

## 2023-03-19 DIAGNOSIS — M5442 Lumbago with sciatica, left side: Secondary | ICD-10-CM | POA: Diagnosis not present

## 2023-04-03 DIAGNOSIS — M9903 Segmental and somatic dysfunction of lumbar region: Secondary | ICD-10-CM | POA: Diagnosis not present

## 2023-04-03 DIAGNOSIS — M955 Acquired deformity of pelvis: Secondary | ICD-10-CM | POA: Diagnosis not present

## 2023-04-03 DIAGNOSIS — M546 Pain in thoracic spine: Secondary | ICD-10-CM | POA: Diagnosis not present

## 2023-04-03 DIAGNOSIS — M9902 Segmental and somatic dysfunction of thoracic region: Secondary | ICD-10-CM | POA: Diagnosis not present

## 2023-04-03 DIAGNOSIS — M9905 Segmental and somatic dysfunction of pelvic region: Secondary | ICD-10-CM | POA: Diagnosis not present

## 2023-04-03 DIAGNOSIS — M5442 Lumbago with sciatica, left side: Secondary | ICD-10-CM | POA: Diagnosis not present

## 2023-04-18 DIAGNOSIS — G8929 Other chronic pain: Secondary | ICD-10-CM | POA: Diagnosis not present

## 2023-04-18 DIAGNOSIS — M7581 Other shoulder lesions, right shoulder: Secondary | ICD-10-CM | POA: Diagnosis not present

## 2023-04-18 DIAGNOSIS — M75121 Complete rotator cuff tear or rupture of right shoulder, not specified as traumatic: Secondary | ICD-10-CM | POA: Diagnosis not present

## 2023-04-18 DIAGNOSIS — M25511 Pain in right shoulder: Secondary | ICD-10-CM | POA: Diagnosis not present

## 2023-04-19 ENCOUNTER — Other Ambulatory Visit: Payer: Self-pay | Admitting: Surgery

## 2023-04-19 ENCOUNTER — Encounter: Payer: Self-pay | Admitting: Surgery

## 2023-04-19 DIAGNOSIS — M75121 Complete rotator cuff tear or rupture of right shoulder, not specified as traumatic: Secondary | ICD-10-CM

## 2023-04-19 DIAGNOSIS — M7581 Other shoulder lesions, right shoulder: Secondary | ICD-10-CM

## 2023-04-25 DIAGNOSIS — E7800 Pure hypercholesterolemia, unspecified: Secondary | ICD-10-CM | POA: Diagnosis not present

## 2023-04-25 DIAGNOSIS — I2510 Atherosclerotic heart disease of native coronary artery without angina pectoris: Secondary | ICD-10-CM | POA: Diagnosis not present

## 2023-04-25 DIAGNOSIS — I480 Paroxysmal atrial fibrillation: Secondary | ICD-10-CM | POA: Diagnosis not present

## 2023-04-25 DIAGNOSIS — E669 Obesity, unspecified: Secondary | ICD-10-CM | POA: Diagnosis not present

## 2023-04-25 DIAGNOSIS — Z8673 Personal history of transient ischemic attack (TIA), and cerebral infarction without residual deficits: Secondary | ICD-10-CM | POA: Diagnosis not present

## 2023-04-25 DIAGNOSIS — I10 Essential (primary) hypertension: Secondary | ICD-10-CM | POA: Diagnosis not present

## 2023-04-25 DIAGNOSIS — I361 Nonrheumatic tricuspid (valve) insufficiency: Secondary | ICD-10-CM | POA: Diagnosis not present

## 2023-04-25 DIAGNOSIS — I350 Nonrheumatic aortic (valve) stenosis: Secondary | ICD-10-CM | POA: Diagnosis not present

## 2023-04-25 DIAGNOSIS — I483 Typical atrial flutter: Secondary | ICD-10-CM | POA: Diagnosis not present

## 2023-04-30 ENCOUNTER — Encounter: Payer: Self-pay | Admitting: Surgery

## 2023-05-09 ENCOUNTER — Inpatient Hospital Stay: Admission: RE | Admit: 2023-05-09 | Payer: Medicare HMO | Source: Ambulatory Visit

## 2023-05-14 ENCOUNTER — Ambulatory Visit
Admission: RE | Admit: 2023-05-14 | Discharge: 2023-05-14 | Disposition: A | Discharge status: Home / Self Care | Payer: Medicare HMO | Source: Ambulatory Visit | Attending: Surgery | Admitting: Surgery

## 2023-05-14 DIAGNOSIS — M75121 Complete rotator cuff tear or rupture of right shoulder, not specified as traumatic: Secondary | ICD-10-CM

## 2023-05-14 DIAGNOSIS — M25511 Pain in right shoulder: Secondary | ICD-10-CM | POA: Diagnosis not present

## 2023-05-14 DIAGNOSIS — M7581 Other shoulder lesions, right shoulder: Secondary | ICD-10-CM

## 2023-05-16 DIAGNOSIS — M75121 Complete rotator cuff tear or rupture of right shoulder, not specified as traumatic: Secondary | ICD-10-CM | POA: Diagnosis not present

## 2023-05-16 DIAGNOSIS — M7581 Other shoulder lesions, right shoulder: Secondary | ICD-10-CM | POA: Diagnosis not present

## 2023-05-25 DIAGNOSIS — R7303 Prediabetes: Secondary | ICD-10-CM | POA: Diagnosis not present

## 2023-05-25 DIAGNOSIS — E039 Hypothyroidism, unspecified: Secondary | ICD-10-CM | POA: Diagnosis not present

## 2023-05-25 DIAGNOSIS — E7800 Pure hypercholesterolemia, unspecified: Secondary | ICD-10-CM | POA: Diagnosis not present

## 2023-05-25 DIAGNOSIS — I480 Paroxysmal atrial fibrillation: Secondary | ICD-10-CM | POA: Diagnosis not present

## 2023-05-25 DIAGNOSIS — I10 Essential (primary) hypertension: Secondary | ICD-10-CM | POA: Diagnosis not present

## 2023-05-25 DIAGNOSIS — Z79899 Other long term (current) drug therapy: Secondary | ICD-10-CM | POA: Diagnosis not present

## 2023-05-31 ENCOUNTER — Other Ambulatory Visit: Payer: Self-pay | Admitting: Surgery

## 2023-06-02 IMAGING — MR MR SHOULDER*L* W/O CM
4 of 5 series · 31 of 40 positions shown · non-contrast
Comparison: MRI left shoulder 07/11/2018

CLINICAL DATA: Left shoulder pain radiating down the left arm to
the fingertips.

EXAM:
MRI OF THE LEFT SHOULDER WITHOUT CONTRAST
TECHNIQUE: Multiplanar, multisequence MR imaging of the shoulder was performed.
No intravenous contrast was administered.

[Series 5: T2 fat-sat · axial · left · 4.0mm · 0.44mm/px · z∈[-60,+54]mm · 8 of 26 slices shown (1 of 3)]
[im 1/26]
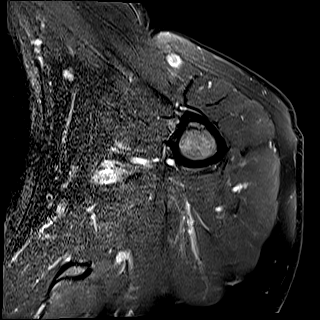
[im 4/26]
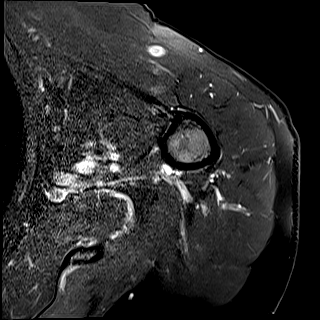
[im 8/26]
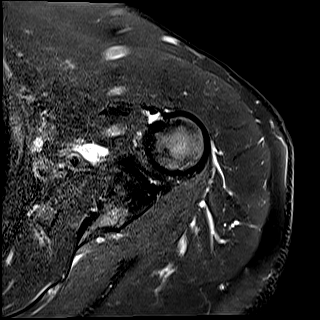
[im 11/26]
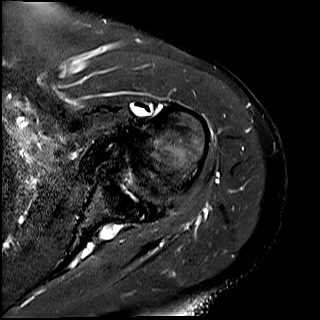
[im 15/26]
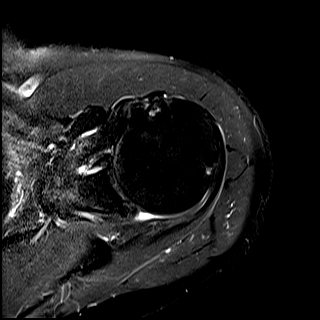
[im 18/26]
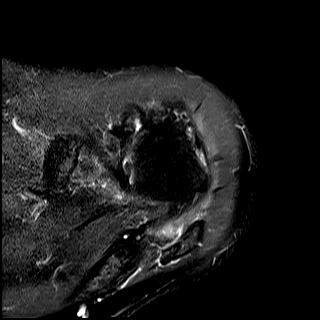
[im 22/26]
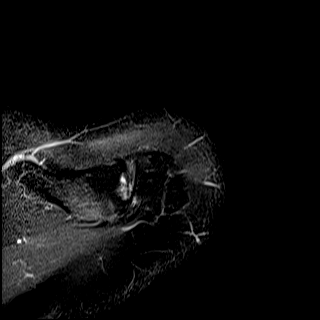
[im 26/26]
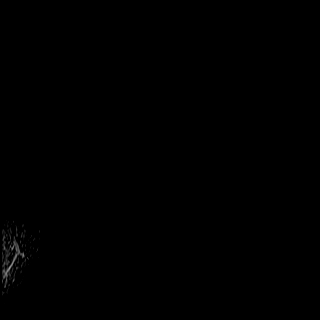

[Series 6: PD · oblique · left · 4.0mm · 0.44mm/px · 9 of 26 slices shown]
[im 1/26]
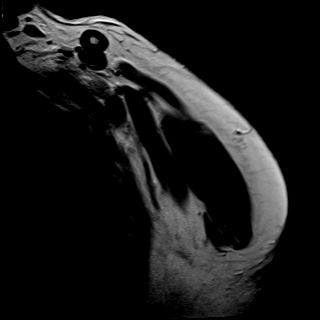
[im 4/26]
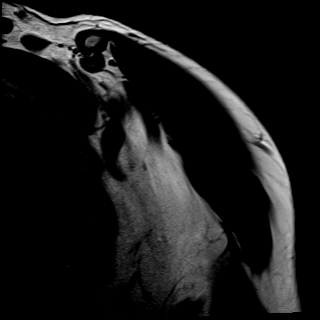
[im 7/26]
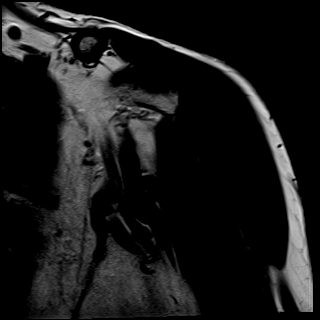
[im 10/26]
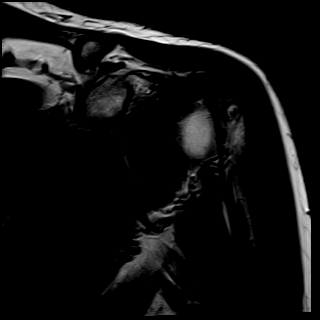
[im 13/26]
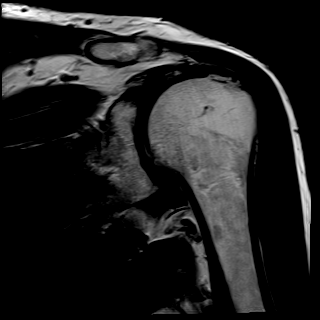
[im 16/26]
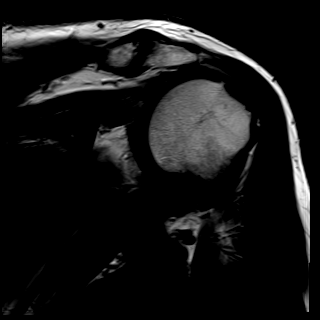
[im 19/26]
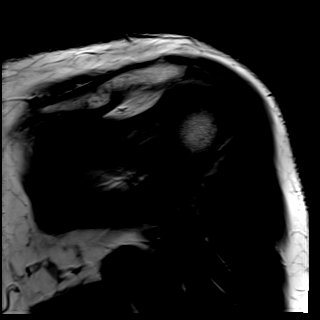
[im 22/26]
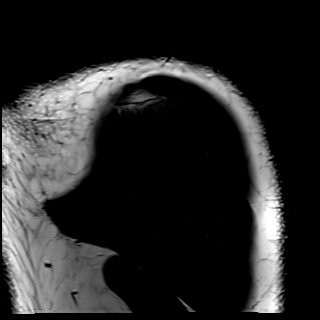
[im 26/26]
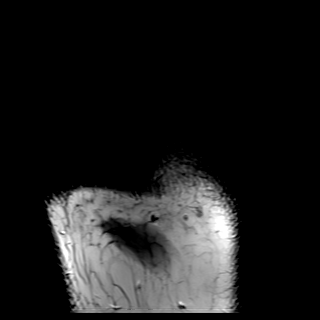

[Series 7: T2 fat-sat · oblique · left · 4.0mm · 0.44mm/px · 9 of 26 slices shown (2 of 3)]
[im 1/26]
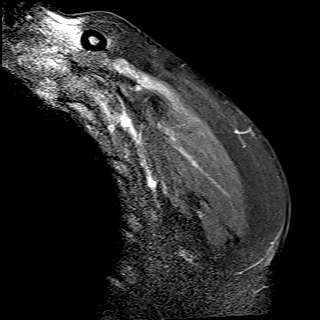
[im 4/26]
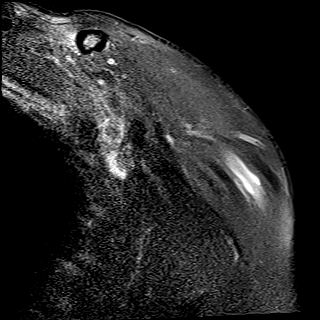
[im 7/26]
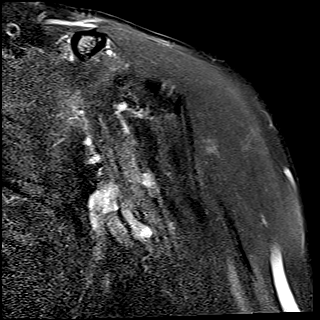
[im 10/26]
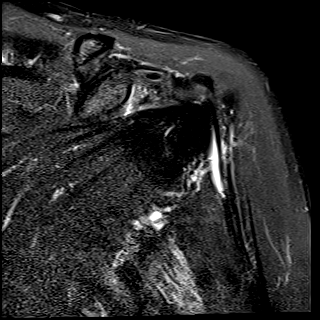
[im 13/26]
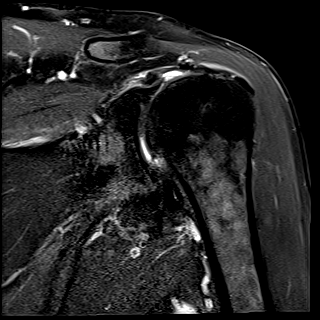
[im 16/26]
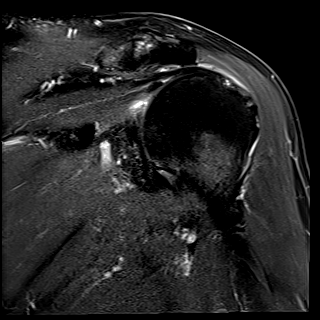
[im 19/26]
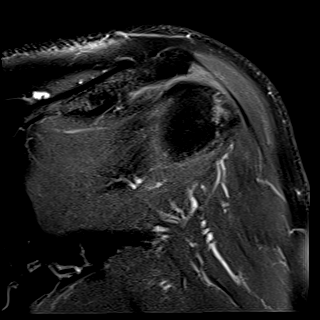
[im 22/26]
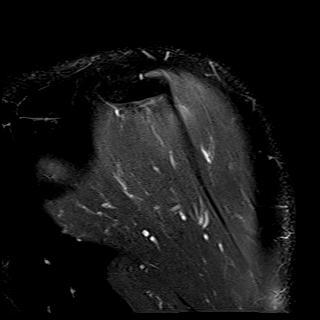
[im 26/26]
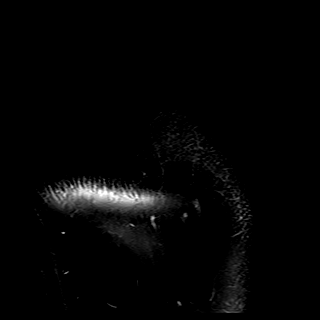

[Series 8: T2 fat-sat · oblique · left · 4.0mm · 0.22mm/px · 5 of 22 slices shown (3 of 3)]
[im 1/22]
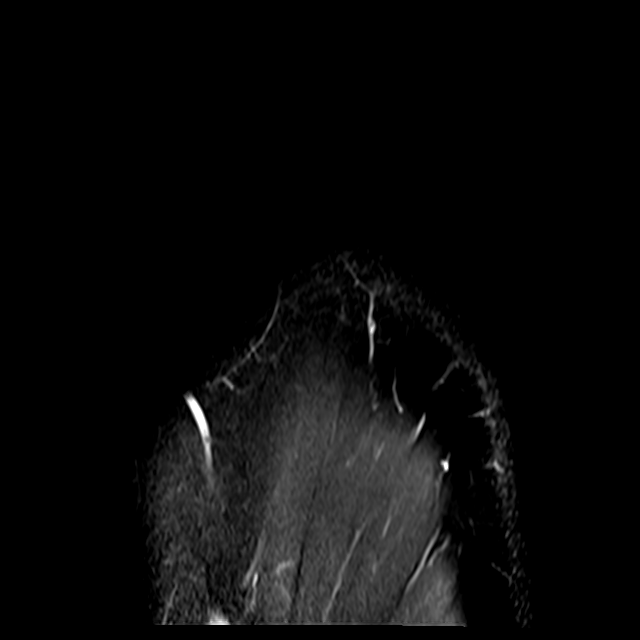
[im 4/22]
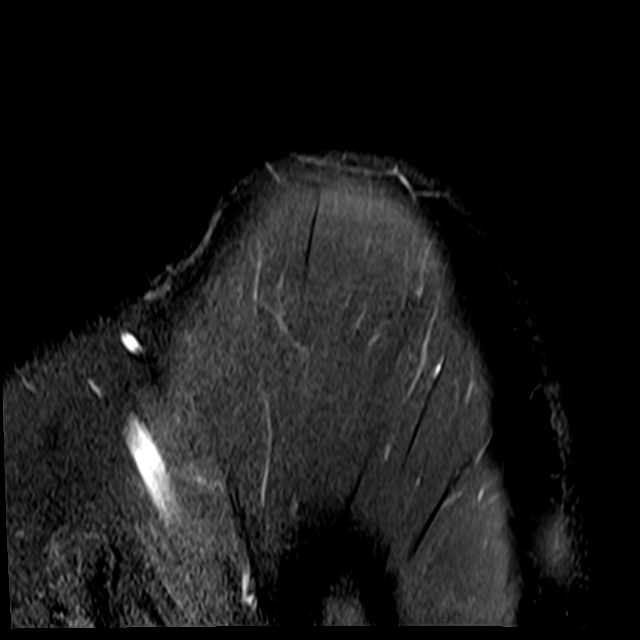
[im 8/22]
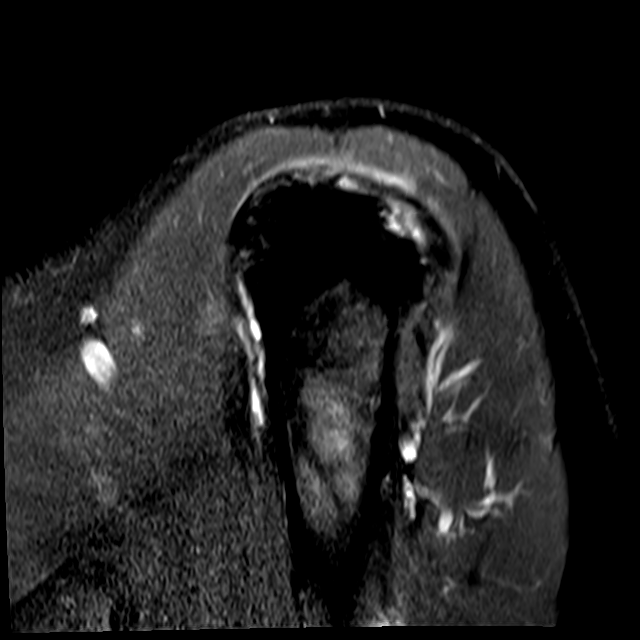
[im 11/22]
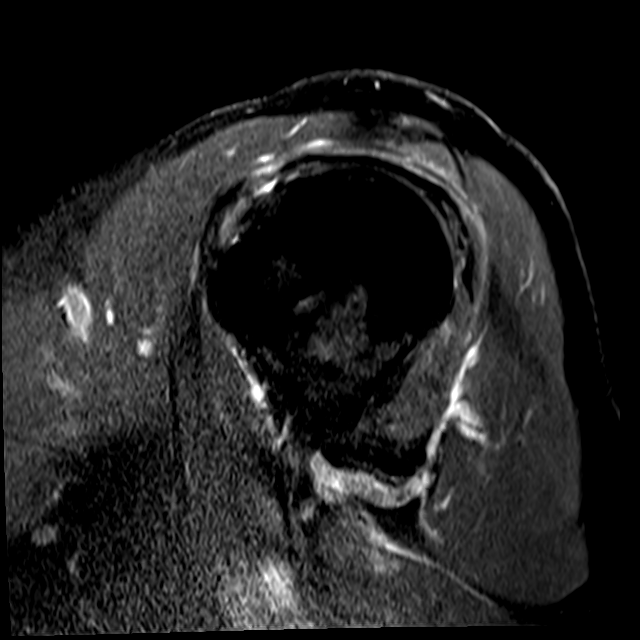
[im 18/22]
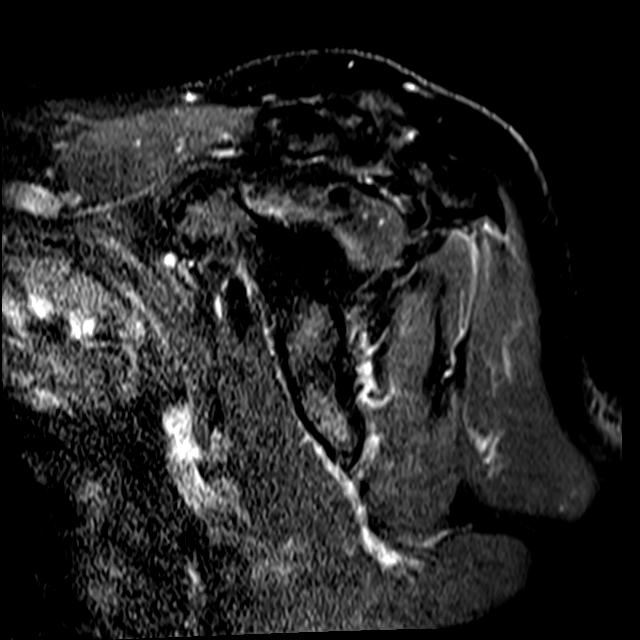

[31 of 40 positions shown; findings below may reference images not displayed]

FINDINGS: Rotator cuff: There is again high-grade attenuation of the anterior
supraspinatus tendon footprint involving the anterior approximate 10
mm. In this region there is a small full-thickness tear measuring
approximately 4 mm in AP dimension (sagittal image 12). This AP
dimension is similar to prior, however the associated longitudinal
length of the full-thickness tendon tear is increased from prior,
now with tendon fiber retraction approximately 3 cm to the medial
aspect of the humeral head where previously there was only 10 mm
retraction (sagittal images 9 through 12, coronal images 13 and 14).
There is mildly worsened moderate attenuation of the anterior
infraspinatus tendon insertion, progressive chronic tearing. The
subscapularis and teres minor are intact.

Muscles: Mild to moderate anterior supraspinatus muscle atrophy is
mildly worsened from prior.

Biceps long head: There is again high-grade attenuation of the
proximal long head of the biceps tendon, likely a chronic high-grade
partial-thickness tear.

Acromioclavicular Joint: There are mild-to-moderate degenerative
changes of the acromioclavicular joint including joint space
narrowing, subchondral marrow edema, and peripheral osteophytosis.
Type II acromion. No subacromial/subdeltoid bursitis.

Glenohumeral Joint: High-grade partial and multifocal full-thickness
cartilage loss within the glenoid and humeral head, mildly worsened
from prior.

Labrum: Mild inferior humeral head-neck junction degenerative
osteophytosis.

Bones:  No acute fracture.

Other: None.
IMPRESSION: Compared to 07/11/2018:

1. Mild worsening of high-grade attenuation of the anterior
supraspinatus tendon footprint with a full-thickness tear again seen
within the anterior supraspinatus tendon measuring a similar 4 mm in
AP dimension but with increased tendon retraction.
2. Mildly worsened moderate attenuation of the anterior
infraspinatus tendon insertion, progressive chronic tearing.
3. Mild-to-moderate anterior supraspinatus muscle atrophy is mildly
worsened from prior.
4. Again high-grade partial-thickness tearing of the proximal long
head of the biceps tendon.
5. Mild-to-moderate degenerative changes of the acromioclavicular
joint.

## 2023-06-07 ENCOUNTER — Other Ambulatory Visit (HOSPITAL_COMMUNITY): Payer: Self-pay

## 2023-06-08 ENCOUNTER — Other Ambulatory Visit: Payer: Self-pay

## 2023-06-08 ENCOUNTER — Other Ambulatory Visit (HOSPITAL_COMMUNITY): Payer: Self-pay

## 2023-06-10 ENCOUNTER — Other Ambulatory Visit (HOSPITAL_COMMUNITY): Payer: Self-pay

## 2023-06-10 MED ORDER — APIXABAN 5 MG PO TABS
5.0000 mg | ORAL_TABLET | Freq: Two times a day (BID) | ORAL | 3 refills | Status: DC
  Filled 2023-08-17 – 2023-08-18 (×2): qty 180, 90d supply, fill #0

## 2023-06-10 MED ORDER — FENOFIBRATE 145 MG PO TABS
145.0000 mg | ORAL_TABLET | Freq: Every day | ORAL | 3 refills | Status: DC
  Filled 2023-06-29: qty 90, 90d supply, fill #0
  Filled 2024-01-07: qty 90, 90d supply, fill #1

## 2023-06-10 MED ORDER — TAMSULOSIN HCL 0.4 MG PO CAPS
0.4000 mg | ORAL_CAPSULE | Freq: Every day | ORAL | 3 refills | Status: DC
  Filled 2023-06-10 – 2023-06-11 (×2): qty 90, 90d supply, fill #0
  Filled 2023-06-29 – 2023-10-29 (×2): qty 90, 90d supply, fill #1
  Filled 2024-02-07: qty 90, 90d supply, fill #2

## 2023-06-10 MED ORDER — LISINOPRIL 10 MG PO TABS: 10.0000 mg | ORAL_TABLET | Freq: Every evening | ORAL | 3 refills | Status: AC

## 2023-06-10 MED ORDER — LEVOTHYROXINE SODIUM 150 MCG PO TABS
150.0000 ug | ORAL_TABLET | Freq: Every day | ORAL | 3 refills | Status: DC
  Filled 2023-07-03: qty 90, 90d supply, fill #0

## 2023-06-10 MED ORDER — ATORVASTATIN CALCIUM 80 MG PO TABS
80.0000 mg | ORAL_TABLET | Freq: Every day | ORAL | 3 refills | Status: DC
  Filled 2023-07-16: qty 90, 90d supply, fill #0
  Filled 2023-10-29: qty 90, 90d supply, fill #1
  Filled 2024-02-07: qty 90, 90d supply, fill #2

## 2023-06-10 MED ORDER — METOPROLOL TARTRATE 50 MG PO TABS
50.0000 mg | ORAL_TABLET | Freq: Two times a day (BID) | ORAL | 3 refills | Status: DC
  Filled 2023-07-15: qty 180, 90d supply, fill #0

## 2023-06-10 MED ORDER — TRIAMCINOLONE ACETONIDE 0.5 % EX CREA
TOPICAL_CREAM | CUTANEOUS | 3 refills | Status: DC
  Filled 2024-01-07: qty 90, 90d supply, fill #0

## 2023-06-10 MED ORDER — OMEPRAZOLE 20 MG PO CPDR
20.0000 mg | DELAYED_RELEASE_CAPSULE | Freq: Every day | ORAL | 3 refills | Status: DC
  Filled 2023-10-29: qty 90, 90d supply, fill #0
  Filled 2024-01-07: qty 90, 90d supply, fill #1

## 2023-06-10 MED ORDER — CLOPIDOGREL BISULFATE 75 MG PO TABS
75.0000 mg | ORAL_TABLET | Freq: Every day | ORAL | 3 refills | Status: DC
  Filled 2023-09-06: qty 90, 90d supply, fill #0
  Filled 2023-12-03: qty 90, 90d supply, fill #1
  Filled 2024-03-12: qty 90, 90d supply, fill #2

## 2023-06-10 MED ORDER — AMLODIPINE BESYLATE 5 MG PO TABS: 5.0000 mg | ORAL_TABLET | Freq: Every day | ORAL | 3 refills | Status: DC

## 2023-06-11 ENCOUNTER — Other Ambulatory Visit (HOSPITAL_COMMUNITY): Payer: Self-pay

## 2023-06-12 ENCOUNTER — Other Ambulatory Visit: Payer: Self-pay

## 2023-06-12 ENCOUNTER — Encounter
Admission: RE | Admit: 2023-06-12 | Discharge: 2023-06-12 | Disposition: A | Discharge status: Home / Self Care | Payer: PPO | Source: Ambulatory Visit | Attending: Surgery | Admitting: Surgery

## 2023-06-12 ENCOUNTER — Inpatient Hospital Stay: Admission: RE | Admit: 2023-06-12 | Payer: Medicare HMO | Source: Ambulatory Visit

## 2023-06-12 VITALS — Ht 65.0 in | Wt 175.0 lb

## 2023-06-12 DIAGNOSIS — I2510 Atherosclerotic heart disease of native coronary artery without angina pectoris: Secondary | ICD-10-CM

## 2023-06-12 DIAGNOSIS — M7581 Other shoulder lesions, right shoulder: Secondary | ICD-10-CM | POA: Diagnosis not present

## 2023-06-12 DIAGNOSIS — E7800 Pure hypercholesterolemia, unspecified: Secondary | ICD-10-CM

## 2023-06-12 DIAGNOSIS — M25511 Pain in right shoulder: Secondary | ICD-10-CM | POA: Diagnosis not present

## 2023-06-12 DIAGNOSIS — M75121 Complete rotator cuff tear or rupture of right shoulder, not specified as traumatic: Secondary | ICD-10-CM | POA: Diagnosis not present

## 2023-06-12 DIAGNOSIS — G8929 Other chronic pain: Secondary | ICD-10-CM | POA: Diagnosis not present

## 2023-06-12 DIAGNOSIS — Z951 Presence of aortocoronary bypass graft: Secondary | ICD-10-CM

## 2023-06-12 DIAGNOSIS — E782 Mixed hyperlipidemia: Secondary | ICD-10-CM

## 2023-06-12 DIAGNOSIS — I10 Essential (primary) hypertension: Secondary | ICD-10-CM

## 2023-06-12 HISTORY — DX: Headache, unspecified: R51.9

## 2023-06-12 HISTORY — DX: Prediabetes: R73.03

## 2023-06-12 NOTE — Pre-Procedure Instructions (Signed)
 Per Dr Juliann Pares patient able to hold Plavix x 5 days and Eliquis x 3 days. Called patient and informed his wife of new instruction. She voiced undertanding. Also in AVS for today's pre op appointment. Copy available to patient for pickup

## 2023-06-12 NOTE — Patient Instructions (Addendum)
 Your procedure is scheduled on: Tuesday 06/19/23 To find out your arrival time, please call [phone] between 1PM - 3PM on:  Monday 06/18/23  Report to the Registration Desk on the 1st floor of the [facility]. FREE Valet parking is available.  If your arrival time is 6:00 am, do not arrive before that time as the [facility] entrance doors do not open until 6:00 am.  REMEMBER: Instructions that are not followed completely may result in serious medical risk, up to and including death; or upon the discretion of your surgeon and anesthesiologist your surgery may need to be rescheduled.  Do not eat food after midnight the night before surgery.  No gum chewing or hard candies.  You may however, drink CLEAR liquids up to 2 hours before you are scheduled to arrive for your surgery. Do not drink anything within 2 hours of your scheduled arrival time.  Clear liquids include: - water   - apple juice without pulp - gatorade (not RED colors) - black coffee or tea (Do NOT add milk or creamers to the coffee or tea) Do NOT drink anything that is not on this list.  Type 1 and Type 2 diabetics should only drink water .  In addition, your doctor has ordered for you to drink the provided:   Gatorade G2 Drinking this carbohydrate drink up to two hours before surgery helps to reduce insulin resistance and improve patient outcomes. Please complete drinking 2 hours before scheduled arrival time.  One week prior to surgery: Stop Anti-inflammatories (NSAIDS) such as Advil, Aleve, Ibuprofen, Motrin, Naproxen, Naprosyn and Aspirin  based products such as Excedrin, Goody's Powder, BC Powder. You may however, continue to take Tylenol  if needed for pain up until the day of surgery.  Stop ANY OVER THE COUNTER supplements for 7 days until after surgery. (Fish Oil)  Continue taking all prescribed medications with the exception of the following: With permission from Dr Florencio, Dr Edie would prefer holding  Plavix  for 5 days and Eliquis  for 3 days. Please contact Dr Florencio as we discussed.  TAKE ONLY THESE MEDICATIONS THE MORNING OF SURGERY WITH A SIP OF WATER :  amLODipine  (NORVASC ) 5 MG tablet  fenofibrate  (TRICOR ) 145 MG tablet  levothyroxine  (SYNTHROID ) 150 MCG tablet  metoprolol  tartrate (LOPRESSOR ) 50 MG tablet  omeprazole  (PRILOSEC) 20 MG capsule Antacid (take one the night before and one on the morning of surgery - helps to prevent nausea after surgery.) tamsulosin  (FLOMAX ) 0.4 MG CAPS capsule   No Alcohol for 24 hours before or after surgery.  No Smoking including e-cigarettes for 24 hours before surgery.  No chewable tobacco products for at least 6 hours before surgery.  No nicotine patches on the day of surgery.  Do not use any recreational drugs for at least a week (preferably 2 weeks) before your surgery.  Please be advised that the combination of cocaine and anesthesia may have negative outcomes, up to and including death. If you test positive for cocaine, your surgery will be cancelled.  On the morning of surgery brush your teeth with toothpaste and water , you may rinse your mouth with mouthwash if you wish. Do not swallow any toothpaste or mouthwash.  Use CHG Soap or wipes as directed on instruction sheet.  Do not wear lotions, powders, or perfumes. NO DEODORANT  Do not shave body hair from the neck down 48 hours before surgery.  Wear clean comfortable clothing (specific to your surgery type) to the hospital.  Do not wear jewelry, make-up, hairpins,  clips or nail polish.  For welded (permanent) jewelry: bracelets, anklets, waist bands, etc.  Please have this removed prior to surgery.  If it is not removed, there is a chance that hospital personnel will need to cut it off on the day of surgery. Contact lenses, hearing aids and dentures may not be worn into surgery.  Do not bring valuables to the hospital. [facility] is not responsible for any missing/lost  belongings or valuables.   Notify your doctor if there is any change in your medical condition (cold, fever, infection).  If you are being discharged the day of surgery, you will not be allowed to drive home. You will need a responsible individual to drive you home and stay with you for 24 hours after surgery.   If you are taking public transportation, you will need to have a responsible individual with you.  If you are being admitted to the hospital overnight, leave your suitcase in the car. After surgery it may be brought to your room.  In case of increased patient census, it may be necessary for you, the patient, to continue your postoperative care in the Same Day Surgery department.  After surgery, you can help prevent lung complications by doing breathing exercises.  Take deep breaths and cough every 1-2 hours. Your doctor may order a device called an Incentive Spirometer to help you take deep breaths. When coughing or sneezing, hold a pillow firmly against your incision with both hands. This is called "splinting." Doing this helps protect your incision. It also decreases belly discomfort.  Surgery Visitation Policy:  Patients undergoing a surgery or procedure may have two family members or support persons with them as long as the person is not COVID-19 positive or experiencing its symptoms.   Inpatient Visitation:    Visiting hours are 7 a.m. to 8 p.m. Up to four visitors are allowed at one time in a patient room. The visitors may rotate out with other people during the day. One designated support person (adult) may remain overnight.  Please call the Pre-admissions Testing Dept. at [phone] if you have any questions about these instructions.     Preparing for Surgery with CHLORHEXIDINE  GLUCONATE (CHG) Soap  Chlorhexidine  Gluconate (CHG) Soap  o An antiseptic cleaner that kills germs and bonds with the skin to continue killing germs even after washing  o Used for  showering the night before surgery and morning of surgery  Before surgery, you can play an important role by reducing the number of germs on your skin.  CHG (Chlorhexidine  gluconate) soap is an antiseptic cleanser which kills germs and bonds with the skin to continue killing germs even after washing.  Please do not use if you have an allergy to CHG or antibacterial soaps. If your skin becomes reddened/irritated stop using the CHG.  1. Shower the NIGHT BEFORE SURGERY and the MORNING OF SURGERY with CHG soap.  2. If you choose to wash your hair, wash your hair first as usual with your normal shampoo.  3. After shampooing, rinse your hair and body thoroughly to remove the shampoo.  4. Use CHG as you would any other liquid soap. You can apply CHG directly to the skin and wash gently with a scrungie or a clean washcloth.  5. Apply the CHG soap to your body only from the neck down. Do not use on open wounds or open sores. Avoid contact with your eyes, ears, mouth, and genitals (private parts). Wash face and genitals (private  parts) with your normal soap.  6. Wash thoroughly, paying special attention to the area where your surgery will be performed.  7. Thoroughly rinse your body with warm water .  8. Do not shower/wash with your normal soap after using and rinsing off the CHG soap.  9. Pat yourself dry with a clean towel.  10. Wear clean pajamas to bed the night before surgery.  12. Place clean sheets on your bed the night of your first shower and do not sleep with pets.  13. Shower again with the CHG soap on the day of surgery prior to arriving at the hospital.  14. Do not apply any deodorants/lotions/powders.  15. Please wear clean clothes to the hospital.

## 2023-06-13 ENCOUNTER — Encounter
Admission: RE | Admit: 2023-06-13 | Discharge: 2023-06-13 | Disposition: A | Discharge status: Home / Self Care | Payer: PPO | Source: Ambulatory Visit | Attending: Surgery | Admitting: Surgery

## 2023-06-13 ENCOUNTER — Encounter: Payer: Self-pay | Admitting: Urgent Care

## 2023-06-13 ENCOUNTER — Encounter: Payer: Self-pay | Admitting: Surgery

## 2023-06-13 DIAGNOSIS — E782 Mixed hyperlipidemia: Secondary | ICD-10-CM | POA: Insufficient documentation

## 2023-06-13 DIAGNOSIS — E7800 Pure hypercholesterolemia, unspecified: Secondary | ICD-10-CM | POA: Insufficient documentation

## 2023-06-13 DIAGNOSIS — I10 Essential (primary) hypertension: Secondary | ICD-10-CM | POA: Insufficient documentation

## 2023-06-13 DIAGNOSIS — Z01810 Encounter for preprocedural cardiovascular examination: Secondary | ICD-10-CM | POA: Insufficient documentation

## 2023-06-13 DIAGNOSIS — I2510 Atherosclerotic heart disease of native coronary artery without angina pectoris: Secondary | ICD-10-CM | POA: Insufficient documentation

## 2023-06-13 DIAGNOSIS — Z951 Presence of aortocoronary bypass graft: Secondary | ICD-10-CM | POA: Diagnosis not present

## 2023-06-14 ENCOUNTER — Encounter: Payer: Self-pay | Admitting: Surgery

## 2023-06-18 ENCOUNTER — Encounter: Payer: Self-pay | Admitting: Surgery

## 2023-06-18 MED ORDER — CEFAZOLIN SODIUM-DEXTROSE 2-4 GM/100ML-% IV SOLN
2.0000 g | INTRAVENOUS | Status: AC
  Administered 2023-06-19: 2 g via INTRAVENOUS

## 2023-06-18 MED ORDER — CHLORHEXIDINE GLUCONATE 0.12 % MT SOLN
15.0000 mL | Freq: Once | OROMUCOSAL | Status: AC
  Administered 2023-06-19: 15 mL via OROMUCOSAL

## 2023-06-18 MED ORDER — ORAL CARE MOUTH RINSE: 15.0000 mL | Freq: Once | OROMUCOSAL | Status: AC

## 2023-06-18 NOTE — Progress Notes (Signed)
 Perioperative / Anesthesia Services  Pre-Admission Testing Clinical Review / Pre-Operative Anesthesia Consult  Date: 06/18/23  Patient Demographics:  Name: Warren Ingram DOB: [birth year] MRN:   [id number]  Planned Surgical Procedure(s):    Case: [id number] Date/Time: 06/19/23 1404   Procedure: RIGHT SHOULDER ARTHROSCOPY WITH DEBRIDEMENT, DECOMPRESSION, ROTATOR CUFF REPAIR, AND BICEPS TENODESIS. (Right: Shoulder)   Anesthesia type: Choice   Pre-op diagnosis:      Rotator cuff tendinitis, right M75.81     Nontraumatic complete tear of right rotator cuff M75.121   Location: [facility] OR ROOM 02 / [facility]   Surgeons: Edie Norleen PARAS, MD      NOTE: Available PAT nursing documentation and vital signs have been reviewed. Clinical nursing staff has updated patient's PMH/PSHx, current medication list, and drug allergies/intolerances to ensure comprehensive history available to assist in medical decision making as it pertains to the aforementioned surgical procedure and anticipated anesthetic course. Extensive review of available clinical information personally performed. [place] [facility] and P[facility] updated with any diagnoses/procedures that  may have been inadvertently omitted during his intake with the pre-admission testing department's nursing staff.  Clinical Discussion:  Warren Ingram is a 83 y.o. male who is submitted for pre-surgical anesthesia review and clearance prior to him undergoing the above procedure. Patient is a Former Smoker (15 pack years; quit 06/1971). Pertinent PMH includes: CAD (s/p CABG), aortic stenosis, atrial fibrillation/flutter, diastolic dysfunction, LEFT pontine infarct, LEFT thalamic infarct, multiple lacunar infarcts, TIA, HTN, HLD, T2DM, hypothyroidism, GERD (on daily PPI), Barrett's esophagus, hiatal hernia, OA, RIGHT rotator cuff tear.  Patient is followed by cardiology Philippe, MD). He was last seen in the cardiology clinic on 04/25/2023;  notes reviewed. At the time of his clinic visit, patient doing well overall from a cardiovascular perspective. Patient denied any chest pain, shortness of breath, PND, orthopnea, palpitations, significant peripheral edema, weakness, fatigue, vertiginous symptoms, or presyncope/syncope. Patient with a past medical history significant for cardiovascular diagnoses. Documented physical exam was grossly benign, providing no evidence of acute exacerbation and/or decompensation of the patient's known cardiovascular conditions.  Patient underwent diagnostic LEFT heart catheterization on 03/30/2009 revealing obstructive coronary artery disease.  There was a 75% lesion in the mid LCx and a 95% lesion in the OM1.  PTCA was performed to the mid LCx lesion and a 2.75 x 15 mm Xience V DES x 1 was placed to the OM1 lesion.  Procedure yielded excellent angiographic result and TIMI-3 flow.  Patient with recurrent anginal symptoms prompting repeat diagnostic LEFT heart catheterization on 07/25/2017.  Study revealed multivessel CAD; 100% proximal RCA, 95% proximal-mid LAD, 10% ostial OM2, 90% lateral OM 2, and 90% mid to distal LCx.  Given the degree and complexity of patient's coronary artery disease, patient was referred to CVTS for consultation regarding revascularization.  Patient underwent four-vessel revascularization procedure on 08/06/2017.  LIMA-LAD, SVG-PDA D1, and SVG-OM1 bypass grafts were placed.  Procedure was complicated on postop day 1 as patient developed ST elevation and leads V2-V3.  Patient was treated with colchicine for pericarditis.  Patient with a history of known BILATERAL carotid artery disease.  Imaging performed on 10/24/2021 revealed development of a radiographic string sign and the LICA.  Patient subsequently underwent PTA with stenting placing a 10 x 8 x 40 Exact stent.  Subsequent Dopplers performed on 12/16/2021, 03/13/2022, and 09/11/2022 revealed 40-59% RICA stenosis with a 1-39%  contralateral stenosis of the LICA.   Patient with a history of multiple CVAs in the  past.   MRI imaging of the brain performed on 07/25/2019 revealed an acute lateral pontine infarct measuring 10 mm.  There was no mass effect causing any degree of midline shift noted.  Additionally, there were remote lacunar infarcts of the RIGHT lentiform nucleus and BILATERAL thalami observed.  MRI imaging performed on 04/05/2022 revealed and a small acute LEFT thalamic infarct  Most recent myocardial perfusion imaging study was performed on 02/01/2022 revealing a normal left ventricular systolic function with an EF of 58%.  Left ventricular cavity noted to enlarge somewhat with stress.  There was no evidence of stress-induced myocardial arrhythmia or ischemia; no scintigraphic evidence of scar.  Patient submaximal stress secondary to failure to reach heart rate.  Baseline ECG suggested atrial flutter.  Most recent TTE was performed on 01/09/2023 revealing a normal left ventricular systolic function with an EF of >55%.  The mild concentric LVH.  There was mild left atrial and right ventricular enlargement.  Additionally, there was severe right atrial enlargement noted.  Trivial aortic and pulmonary, mild mitral, and severe tricuspid valve regurgitation was noted.  RVSP 39 mmHg.  There was mild aortic valve stenosis with a mean pressure gradient of 18 mmHg; AVA (VTI) 0.9 cm).  Patient with an atrial fibrillation diagnosis; CHA2DS2-VASc Score = 5 (age x 2, HTN, CVA x 2). His rate and rhythm are currently being maintained on oral metoprolol  tartrate. He is chronically anticoagulated using apixaban ; reported to be compliant with therapy with no evidence or reports of GI/GU bleeding.   Blood pressure well controlled at 118/60 mmHg on currently prescribed CCB (amlodipine ), ACEi (lisinopril ), and beta-blocker (metoprolol  succinate) therapies.  Patient is on atorvastatin  + fenofibrate  + omega-3 fatty acid for his HLD  diagnosis and ASCVD prevention. T2DM reasonably controlled with diet lifestyle modification alone. Last HgbA1c was 6.6% when checked on 05/25/2023. He does not have an OSAH diagnosis. Patient is able to complete all of his  ADL/IADLs without cardiovascular limitation.  Per the DASI, patient is able to achieve at least 4 METS of physical activity without experiencing any significant degree of angina/anginal equivalent symptoms. No changes were made to his medication regimen during his visit with cardiology.  Patient scheduled to follow-up with outpatient cardiology in 6 months or sooner if needed.  Warren Ingram is scheduled for an elective RIGHT SHOULDER ARTHROSCOPY WITH DEBRIDEMENT, DECOMPRESSION, ROTATOR CUFF REPAIR, AND BICEPS TENODESIS. (Right: Shoulder) on 06/19/2023 with Dr. Norleen Maltos, MD.  Given patient's past medical history significant for cardiovascular diagnoses, presurgical cardiac clearance was sought by the PAT team. Per cardiology, this patient is optimized for surgery and may proceed with the planned procedural course with a LOW/ACCEPTABLE risk of significant perioperative cardiovascular complications.  Again, this patient is on daily oral anticoagulation and antithrombotic therapies.  He has been instructed on recommendations from his cardiologist for holding his apixaban  for 3 days (last dose 06/15/2023) and his clopidogrel  for 5 days (last dose 06/13/2023) prior to his procedure with plans to restart postoperatively once bleeding risk is felt to be minimized by his primary attending surgeon.  Patient denies previous perioperative complications with anesthesia in the past. In review his EMR, it is noted that patient underwent a neuraxial anesthetic course here at [facility] in 06/2019 without documented complications.      06/12/2023    2:53 PM 09/11/2022   10:45 AM 09/11/2022   10:39 AM  Vitals with BMI  Height 5' 5  5' 4  Weight  175  lbs  179 lbs 10 oz  BMI 29.12  30.81  Systolic  148 140  Diastolic  66 82  Pulse  46 60   Providers/Specialists:  NOTE: Primary physician provider listed below. Patient may have been seen by APP or partner within same practice.   PROVIDER ROLE / SPECIALTY LAST OV  Poggi, Norleen PARAS, MD Orthopedics (Surgeon) 06/12/2023  Auston Reyes BIRCH, MD Primary Care Provider 05/25/2023  Florencio Shine, MD Cardiology 04/25/2023   Allergies:  No Known Allergies Current Home Medications:   No current facility-administered medications for this encounter.    acetaminophen  (TYLENOL ) 500 MG tablet   amLODipine  (NORVASC ) 5 MG tablet   apixaban  (ELIQUIS ) 5 MG TABS tablet   atorvastatin  (LIPITOR ) 80 MG tablet   clopidogrel  (PLAVIX ) 75 MG tablet   fenofibrate  (TRICOR ) 145 MG tablet   levothyroxine  (SYNTHROID ) 150 MCG tablet   lisinopril  (ZESTRIL ) 10 MG tablet   metoprolol  tartrate (LOPRESSOR ) 50 MG tablet   Omega-3 Fatty Acids (FISH OIL) 1000 MG CAPS   omeprazole  (PRILOSEC) 20 MG capsule   tamsulosin  (FLOMAX ) 0.4 MG CAPS capsule   triamcinolone  cream (KENALOG ) 0.5 %   History:   Past Medical History:  Diagnosis Date   Aortic stenosis 07/26/2019   a.) TTE Bubble study 07/26/2019: mild AS (MPG 13.1); b.) TTE 05/16/2022: mod AS (MPG 12.5; AVA 0.89); c.) TTE 01/09/2023: mild AS (MPG 18; AVA 0.90)   Arthritis    Atrial fibrillation and flutter (HCC)    a.) CHA2DS2-VASc = 5 (age x2, HTN, CVA x2) as of 06/14/2023; b.) cardiac rate/rhythm maintained on oral metoprolol  tartrate; chronically anticoagulated using apixaban    Barrett esophagus    Benign neoplasm of abdomen    Bilateral carotid artery disease (HCC)    a.) doppler 07/21/2013: <50 % BICA; b.) 10/24/2021 --> radiographic string sign LICA --> PTA with stenting (10 x 8 x 40 Exact stent; c.) doppler 12/16/2021, 03/13/2022, 09/11/2022: 40-59% RICA, 1-39% LICA   Coronary artery disease 03/30/2009   a.) LHC/PCI 03/30/2009: 75% mLCX (PTCA), 95% OM1  (2.75 x 15 mm Xience V DES); b.) LHC 07/25/2017: 100% pRCA, 95% p-mLAD, 10% oOM2, 90% lat OM2, 90% m-dLCx --> CVTS consult; c.) s/p 4v CABG   Diastolic dysfunction 07/26/2019   a.) bubble study 07/26/2019: EF 55-60%, no RWMAs, G1DD, triv TR; b.) TTE 07/23/2016: EF >55%, LVH, mild LAE, AoV sclerosis, mildly elevated PASP, G1DD; c.) TTE 05/16/2022: EF >55%, mod LVH, mod BAE, mod RVE, mod AS), triv MR, mod TR, mild PR; d.) TTE 01/09/2023: EF >55%, LVH, mild RVE, sev RAE, triv AR, mild AS, mild MR, sev TR (RVSP 39), triv PR   Diverticulitis    GERD (gastroesophageal reflux disease)    Headache    Heart murmur    History of hiatal hernia    Hypercholesterolemia    Hypertension    Hypothyroidism    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   a.) MRI brain 07/25/2019: acute lateral pontine infarct measuring 10 mm   Long term current use of clopidogrel     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   a.) MRI brain 07/25/2019: remote lacunar infact of the RIGHT lentiform nucleus and BILATERAL thalami   Nontraumatic complete tear of rotator cuff, right    On apixaban  therapy    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   a.) LIMA-LAD, SVG-PDA, SVG-D1, SVG-OM1; b.) POD1 patient with STE in V2-V3 --> Tx'd with colchicine for pericarditis   Skin cancer    T2DM (type 2 diabetes  mellitus) (HCC)    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   a.) MRI brain 04/05/2022: small acute LEFT thalamic infarct   TIA (transient ischemic attack) 2015   Tubular adenoma    Past Surgical History:  Procedure Laterality Date   CAROTID PTA/STENT INTERVENTION Left 11/08/2021   Procedure: CAROTID PTA/STENT INTERVENTION;  Surgeon: Jama Cordella MATSU, MD;  Location: [facility] INVASIVE CV LAB;  Service: Cardiovascular;  Laterality: Left;   COLONOSCOPY WITH PROPOFOL  N/A 01/01/2018   Procedure: COLONOSCOPY WITH PROPOFOL ;  Surgeon: Therisa Bi, MD;  Location: [facility] ENDOSCOPY;  Service: Gastroenterology;  Laterality: N/A;   CORONARY ANGIOPLASTY WITH STENT  PLACEMENT Left 03/30/2009   Procedure: CORONARY ANGIOPLASTY WITH STENT PLACEMENT; Location: [facility]; Surgeons: Vinie Jude, MD (diagnostic) and Marsa Dooms, MD (interventional)   CORONARY ARTERY BYPASS GRAFT  08/06/2017   4 vessels   ESOPHAGOGASTRODUODENOSCOPY (EGD) WITH PROPOFOL  N/A 10/28/2015   Procedure: ESOPHAGOGASTRODUODENOSCOPY (EGD) WITH PROPOFOL ;  Surgeon: Deward CINDERELLA Piedmont, MD;  Location: [facility] ENDOSCOPY;  Service: Gastroenterology;  Laterality: N/A;   KNEE ARTHROSCOPY     LEFT HEART CATH AND CORONARY ANGIOGRAPHY N/A 07/25/2017   Procedure: LEFT HEART CATH AND CORONARY ANGIOGRAPHY;  Surgeon: Jude Vinie LABOR, MD;  Location: [facility] INVASIVE CV LAB;  Service: Cardiovascular;  Laterality: N/A;   NASAL SINUS SURGERY     PARTIAL KNEE ARTHROPLASTY Right 06/27/2019   Procedure: UNICOMPARTMENTAL KNEE;  Surgeon: Edie Norleen PARAS, MD;  Location: [facility] ORS;  Service: Orthopedics;  Laterality: Right;   TOTAL KNEE ARTHROPLASTY Left 12/19/2018   Procedure: TOTAL KNEE ARTHROPLASTY;  Surgeon: Edie Norleen PARAS, MD;  Location: [facility] ORS;  Service: Orthopedics;  Laterality: Left;   VASECTOMY     Family History  Problem Relation Age of Onset   Heart attack Mother    Lung disease Father    Social History   Tobacco Use   Smoking status: Former    Current packs/day: 0.00    Average packs/day: 1 pack/day for 15.0 years (15.0 ttl pk-yrs)    Types: Cigarettes    Start date: 06/05/1956    Quit date: 06/06/1971    Years since quitting: 52.0   Smokeless tobacco: Former  Substance Use Topics   Alcohol use: Yes    Alcohol/week: 2.0 standard drinks of alcohol    Types: 1 Cans of beer, 1 Shots of liquor per week    Comment: 4 to 5 times per week   Pertinent Clinical Results:  LABS:  No visits with results within 30 Day(s) from this visit.  Latest known visit with results is:  Admission on 04/05/2022, Discharged on 04/05/2022  Component Date Value Ref Range Status   Sodium 04/05/2022 139  135 - 145 mmol/L Final    Potassium 04/05/2022 4.3  3.5 - 5.1 mmol/L Final   Chloride 04/05/2022 105  98 - 111 mmol/L Final   CO2 04/05/2022 29  22 - 32 mmol/L Final   Glucose, Bld 04/05/2022 135 (H)  70 - 99 mg/dL Final   Glucose reference range applies only to samples taken after fasting for at least 8 hours.   BUN 04/05/2022 21  8 - 23 mg/dL Final   Creatinine, Ser 04/05/2022 1.22  0.61 - 1.24 mg/dL Final   Calcium  04/05/2022 9.2  8.9 - 10.3 mg/dL Final   GFR, Estimated 04/05/2022 60 (L)  >60 mL/min Final   Comment: (NOTE) Calculated using the CKD-EPI Creatinine Equation (2021)    Anion gap 04/05/2022 5  5 - 15 Final   Performed at [facility], [address] [place], [place] [address]   WBC 04/05/2022 7.1  4.0 - 10.5 K/uL Final   RBC 04/05/2022 5.10  4.22 - 5.81 MIL/uL Final   Hemoglobin 04/05/2022 16.1  13.0 - 17.0 g/dL Final   HCT 88/98/7976 47.5  39.0 - 52.0 % Final   MCV 04/05/2022 93.1  80.0 - 100.0 fL Final   MCH 04/05/2022 31.6  26.0 - 34.0 pg Final   MCHC 04/05/2022 33.9  30.0 - 36.0 g/dL Final   RDW 88/98/7976 13.9  11.5 - 15.5 % Final   Platelets 04/05/2022 210  150 - 400 K/uL Final   nRBC 04/05/2022 0.0  0.0 - 0.2 % Final   Neutrophils Relative % 04/05/2022 68  % Final   Neutro Abs 04/05/2022 4.9  1.7 - 7.7 K/uL Final   Lymphocytes Relative 04/05/2022 22  % Final   Lymphs Abs 04/05/2022 1.6  0.7 - 4.0 K/uL Final   Monocytes Relative 04/05/2022 7  % Final   Monocytes Absolute 04/05/2022 0.5  0.1 - 1.0 K/uL Final   Eosinophils Relative 04/05/2022 3  % Final   Eosinophils Absolute 04/05/2022 0.2  0.0 - 0.5 K/uL Final   Basophils Relative 04/05/2022 0  % Final   Basophils Absolute 04/05/2022 0.0  0.0 - 0.1 K/uL Final   Immature Granulocytes 04/05/2022 0  % Final   Abs Immature Granulocytes 04/05/2022 0.02  0.00 - 0.07 K/uL Final   Performed at [facility], [address] [place], [place] [address]   Magnesium  04/05/2022 2.0  1.7 - 2.4 mg/dL Final   Performed at [facility], [address] [place], [place] [address]    ECG: Date: 06/13/2023 Time ECG obtained: 1317 PM Rate: 58 bpm Rhythm: atrial fibrillation; RBBB Axis (leads I and aVF): normal Intervals: QRS 260 ms. QTc 496 ms. ST segment and T wave changes: Lateral T wave abnormality Comparison: Similar to previous tracing obtained on 04/05/2022   IMAGING / PROCEDURES: MR SHOULDER RIGHT WO CONTRAST performed on 05/14/2023 Full-thickness tear of the anterior supraspinatus tendon insertion with up to 13 mm tendon retraction. Just posterior to this, there is moderate partial-thickness articular sided tearing of the proximal tendon footprint fibers with up to 18 mm tendon retraction. Mild-to-moderate anterior infraspinatus tendinosis. The intra-articular long head of the biceps tendon is not well visualized proximal to the bicipital groove. This may be due to a full-thickness proximal tendon tear with distal tendon retraction. Moderate degenerative changes of the acromioclavicular joint. Mild-to-moderate glenohumeral cartilage degenerative changes.  VAS US  CAROTID performed on 09/11/2022 Velocities in the right ICA are consistent with a 40-59% stenosis. Hemodynamically significant plaque >50% visualized in the CCA. The ECA appears <50% stenosed.  Velocities in the left ICA are consistent with a 1-39% stenosis. The ECA appears <50% stenosed. Widely patent CCA/ICA stent with no evidence of re-stenosis.  Bilateral vertebral arteries demonstrate antegrade flow.  Normal flow hemodynamics were seen in bilateral subclavian arteries.   MR BRAIN WO CONTRAST performed on 04/05/2022 Small acute left thalamic infarct.  Mild edema without mass effect.  No evidence of acute hemorrhage, mass lesion, midline shift or hydrocephalus.  Mild for age scattered T2/FLAIR hyperintensities in the white matter, nonspecific but compatible with chronic microvascular ischemic disease.  ECHOCARDIOGRAM LIMITED BUBBLE  STUDY performed on 07/26/2019 Left ventricular ejection fraction, by estimation, is 55 to 60%. Left ventricular ejection fraction by PLAX is 57 %. The left ventricle has normal function. The left ventricle has no regional wall motion abnormalities.  Left ventricular diastolic parameters are consistent with Grade I diastolic dysfunction (impaired relaxation).  Right ventricular systolic function was not well visualized. The right ventricular size is normal. There is normal pulmonary artery systolic pressure.  The mitral valve is normal in structure and function. Trivial mitral valve regurgitation.  The aortic valve is normal in structure and function.  Conclusion(s)/Recommendation(s): Normal biventricular function without evidence of hemodynamically significant valvular heart disease. Cannot  exclude ASD/PFO. Consider transesophageal echocardiogram, if clinically indicated. No evidence of valvular vegetations on this transthoracic echocardiogram. Would recommend a transesophageal echocardiogram to exclude infective endocarditis if clinically indicated. Unable to exclude left ventricular thrombus, would recommend a repeat transthoracic echocardiogram with contrast. No intracardiac source of embolism detected  on this transthoracic study. A transesophageal echocardiogram is recommended to exclude cardiac source of embolism if clinically indicated.   CORONARY ARTERY BYPASS GRAFTING performed on  08/06/2017 Four-vessel revascularization LIMA-LAD SVG-PDA SVG-D1 SVG-OM1  LEFT HEART CATHETERIZATION AND CORONARY ANGIOGRAPHY performed on 07/25/2017 Normal left ventricular systolic function and EF Multivessel CAD Prox RCA lesion is 100% stenosed. Prox LAD to Mid LAD lesion is 95% stenosed. Ost 2nd Mrg lesion is 10% stenosed Lat 2nd Mrg lesion is 90% stenosed. Mid Cx to Dist Cx lesion is 90% stenosed. Recommendations: refer to CVTS for consideration of revascularization   Impression and Plan:  Warren Ingram has been referred for pre-anesthesia review and clearance prior to him undergoing the planned anesthetic and procedural courses. Available labs, pertinent testing, and imaging results were personally reviewed by me in preparation for upcoming operative/procedural course. [facility] medical record has been updated following extensive record review and patient interview with PAT staff.   This patient has been appropriately cleared by cardiology with an overall LOW/ACCEPTABLE risk of experiencing significant perioperative cardiovascular complications. Based on clinical review performed today (06/18/23), barring any significant acute changes in the patient's overall condition, it is anticipated that he will be able to proceed with the planned surgical intervention. Any acute changes in clinical condition may necessitate his procedure being postponed and/or cancelled. Patient will meet with anesthesia team (MD and/or CRNA) on the day of his procedure for preoperative evaluation/assessment. Questions regarding anesthetic course will be fielded at that time.   Pre-surgical instructions were reviewed with the patient during his PAT appointment, and questions were fielded to satisfaction by PAT clinical staff. He has been instructed on which medications that he will need to hold prior to surgery, as well as the ones that have been deemed safe/appropriate to take on the day of his procedure. As part of the general education provided by PAT, patient made aware both verbally and in writing, that he would need to abstain from the use of any illegal substances during his perioperative course. He was advised that failure to follow the provided instructions could necessitate case cancellation or result in serious perioperative complications up to and including death. Patient encouraged to contact PAT and/or his surgeon's office to discuss any questions or concerns that may arise prior to surgery; verbalized  understanding.   Dorise Pereyra, MSN, APRN, FNP-C, CEN [facility]  Perioperative Services Nurse Practitioner Phone: [phone] Fax: [phone] 06/18/23 12:14 PM  NOTE: This note has been prepared using Dragon dictation software. Despite my best ability to proofread, there is always the potential that unintentional transcriptional errors may still occur from this process.

## 2023-06-19 ENCOUNTER — Ambulatory Visit: Payer: PPO | Admitting: Urgent Care

## 2023-06-19 ENCOUNTER — Encounter
Admission: RE | Disposition: A | Discharge status: Home / Self Care | Payer: Self-pay | Source: Home / Self Care | Attending: Surgery

## 2023-06-19 ENCOUNTER — Ambulatory Visit
Admission: RE | Admit: 2023-06-19 | Discharge: 2023-06-19 | Disposition: A | Discharge status: Home / Self Care | Payer: PPO | Attending: Surgery | Admitting: Surgery

## 2023-06-19 ENCOUNTER — Ambulatory Visit: Payer: PPO

## 2023-06-19 ENCOUNTER — Encounter: Payer: Self-pay | Admitting: Surgery

## 2023-06-19 ENCOUNTER — Other Ambulatory Visit: Payer: Self-pay

## 2023-06-19 DIAGNOSIS — Z87891 Personal history of nicotine dependence: Secondary | ICD-10-CM | POA: Diagnosis not present

## 2023-06-19 DIAGNOSIS — M24111 Other articular cartilage disorders, right shoulder: Secondary | ICD-10-CM | POA: Diagnosis not present

## 2023-06-19 DIAGNOSIS — M19011 Primary osteoarthritis, right shoulder: Secondary | ICD-10-CM | POA: Insufficient documentation

## 2023-06-19 DIAGNOSIS — M25811 Other specified joint disorders, right shoulder: Secondary | ICD-10-CM | POA: Insufficient documentation

## 2023-06-19 DIAGNOSIS — Z8673 Personal history of transient ischemic attack (TIA), and cerebral infarction without residual deficits: Secondary | ICD-10-CM | POA: Diagnosis not present

## 2023-06-19 DIAGNOSIS — I2510 Atherosclerotic heart disease of native coronary artery without angina pectoris: Secondary | ICD-10-CM | POA: Insufficient documentation

## 2023-06-19 DIAGNOSIS — M7521 Bicipital tendinitis, right shoulder: Secondary | ICD-10-CM | POA: Diagnosis not present

## 2023-06-19 DIAGNOSIS — I350 Nonrheumatic aortic (valve) stenosis: Secondary | ICD-10-CM | POA: Insufficient documentation

## 2023-06-19 DIAGNOSIS — M75121 Complete rotator cuff tear or rupture of right shoulder, not specified as traumatic: Secondary | ICD-10-CM | POA: Diagnosis not present

## 2023-06-19 DIAGNOSIS — K219 Gastro-esophageal reflux disease without esophagitis: Secondary | ICD-10-CM | POA: Insufficient documentation

## 2023-06-19 DIAGNOSIS — Z951 Presence of aortocoronary bypass graft: Secondary | ICD-10-CM | POA: Insufficient documentation

## 2023-06-19 DIAGNOSIS — I4892 Unspecified atrial flutter: Secondary | ICD-10-CM | POA: Diagnosis not present

## 2023-06-19 DIAGNOSIS — G8918 Other acute postprocedural pain: Secondary | ICD-10-CM | POA: Diagnosis not present

## 2023-06-19 DIAGNOSIS — Z955 Presence of coronary angioplasty implant and graft: Secondary | ICD-10-CM | POA: Insufficient documentation

## 2023-06-19 DIAGNOSIS — E119 Type 2 diabetes mellitus without complications: Secondary | ICD-10-CM | POA: Insufficient documentation

## 2023-06-19 DIAGNOSIS — I4891 Unspecified atrial fibrillation: Secondary | ICD-10-CM | POA: Diagnosis not present

## 2023-06-19 DIAGNOSIS — M7581 Other shoulder lesions, right shoulder: Secondary | ICD-10-CM | POA: Diagnosis not present

## 2023-06-19 DIAGNOSIS — I10 Essential (primary) hypertension: Secondary | ICD-10-CM | POA: Diagnosis not present

## 2023-06-19 DIAGNOSIS — M7541 Impingement syndrome of right shoulder: Secondary | ICD-10-CM | POA: Diagnosis not present

## 2023-06-19 HISTORY — DX: Long term (current) use of anticoagulants: Z79.01

## 2023-06-19 HISTORY — DX: Benign neoplasm, unspecified site: D36.9

## 2023-06-19 HISTORY — PX: SHOULDER ARTHROSCOPY WITH SUBACROMIAL DECOMPRESSION, ROTATOR CUFF REPAIR AND BICEP TENDON REPAIR: SHX5687

## 2023-06-19 HISTORY — DX: Complete rotator cuff tear or rupture of right shoulder, not specified as traumatic: M75.121

## 2023-06-19 HISTORY — DX: Unspecified malignant neoplasm of skin, unspecified: C44.90

## 2023-06-19 HISTORY — DX: Unspecified atrial fibrillation: I48.91

## 2023-06-19 HISTORY — DX: Diverticulitis of intestine, part unspecified, without perforation or abscess without bleeding: K57.92

## 2023-06-19 HISTORY — DX: Disorder of arteries and arterioles, unspecified: I77.9

## 2023-06-19 HISTORY — DX: Type 2 diabetes mellitus without complications: E11.9

## 2023-06-19 HISTORY — DX: Long term (current) use of antithrombotics/antiplatelets: Z79.02

## 2023-06-19 LAB — GLUCOSE, CAPILLARY: Glucose-Capillary: 157 mg/dL — ABNORMAL HIGH (ref 70–99)

## 2023-06-19 SURGERY — SHOULDER ARTHROSCOPY WITH SUBACROMIAL DECOMPRESSION, ROTATOR CUFF REPAIR AND BICEP TENDON REPAIR
Anesthesia: General | Site: Shoulder | Laterality: Right

## 2023-06-19 MED ORDER — LACTATED RINGERS IR SOLN
Status: DC | PRN
  Administered 2023-06-19: 3001 mL

## 2023-06-19 MED ORDER — SUGAMMADEX SODIUM 200 MG/2ML IV SOLN
INTRAVENOUS | Status: DC | PRN
Start: 2023-06-19 — End: 2023-06-19
  Administered 2023-06-19: 200 mg via INTRAVENOUS

## 2023-06-19 MED ORDER — BUPIVACAINE HCL (PF) 0.5 % IJ SOLN
INTRAMUSCULAR | Status: DC | PRN
  Administered 2023-06-19: 7 mL via PERINEURAL
  Administered 2023-06-19: 3 mL via PERINEURAL

## 2023-06-19 MED ORDER — CHLORHEXIDINE GLUCONATE 0.12 % MT SOLN
OROMUCOSAL | Status: AC
  Filled 2023-06-19: qty 15

## 2023-06-19 MED ORDER — LIDOCAINE HCL (PF) 1 % IJ SOLN
INTRAMUSCULAR | Status: AC
  Filled 2023-06-19: qty 5

## 2023-06-19 MED ORDER — EPINEPHRINE PF 1 MG/ML IJ SOLN
INTRAMUSCULAR | Status: AC
  Filled 2023-06-19: qty 2

## 2023-06-19 MED ORDER — FENTANYL CITRATE (PF) 100 MCG/2ML IJ SOLN
INTRAMUSCULAR | Status: DC | PRN
  Administered 2023-06-19: 25 ug via INTRAVENOUS
  Administered 2023-06-19: 50 ug via INTRAVENOUS
  Administered 2023-06-19: 25 ug via INTRAVENOUS

## 2023-06-19 MED ORDER — FENTANYL CITRATE PF 50 MCG/ML IJ SOSY
PREFILLED_SYRINGE | INTRAMUSCULAR | Status: AC
  Filled 2023-06-19: qty 1

## 2023-06-19 MED ORDER — BUPIVACAINE LIPOSOME 1.3 % IJ SUSP
INTRAMUSCULAR | Status: AC
  Filled 2023-06-19: qty 20

## 2023-06-19 MED ORDER — OXYCODONE HCL 5 MG/5ML PO SOLN: 5.0000 mg | Freq: Once | ORAL | Status: DC | PRN

## 2023-06-19 MED ORDER — EPHEDRINE SULFATE-NACL 50-0.9 MG/10ML-% IV SOSY
PREFILLED_SYRINGE | INTRAVENOUS | Status: DC | PRN
  Administered 2023-06-19 (×3): 5 mg via INTRAVENOUS

## 2023-06-19 MED ORDER — LIDOCAINE HCL (CARDIAC) PF 100 MG/5ML IV SOSY
PREFILLED_SYRINGE | INTRAVENOUS | Status: DC | PRN
  Administered 2023-06-19: 40 mg via INTRAVENOUS

## 2023-06-19 MED ORDER — OXYCODONE HCL 5 MG PO TABS: 5.0000 mg | ORAL_TABLET | Freq: Once | ORAL | Status: DC | PRN

## 2023-06-19 MED ORDER — PROPOFOL 10 MG/ML IV BOLUS
INTRAVENOUS | Status: DC | PRN
  Administered 2023-06-19: 140 mg via INTRAVENOUS

## 2023-06-19 MED ORDER — BUPIVACAINE LIPOSOME 1.3 % IJ SUSP
INTRAMUSCULAR | Status: DC | PRN
  Administered 2023-06-19: 13 mL via PERINEURAL
  Administered 2023-06-19: 7 mL via PERINEURAL

## 2023-06-19 MED ORDER — FENTANYL CITRATE (PF) 100 MCG/2ML IJ SOLN: 25.0000 ug | INTRAMUSCULAR | Status: DC | PRN

## 2023-06-19 MED ORDER — FENTANYL CITRATE PF 50 MCG/ML IJ SOSY
50.0000 ug | PREFILLED_SYRINGE | Freq: Once | INTRAMUSCULAR | Status: AC
  Administered 2023-06-19: 50 ug via INTRAVENOUS

## 2023-06-19 MED ORDER — PHENYLEPHRINE HCL-NACL 20-0.9 MG/250ML-% IV SOLN
INTRAVENOUS | Status: DC | PRN
  Administered 2023-06-19: 25 ug/min via INTRAVENOUS

## 2023-06-19 MED ORDER — ONDANSETRON HCL 4 MG/2ML IJ SOLN
INTRAMUSCULAR | Status: DC | PRN
  Administered 2023-06-19: 4 mg via INTRAVENOUS

## 2023-06-19 MED ORDER — LACTATED RINGERS IV SOLN: INTRAVENOUS | Status: DC

## 2023-06-19 MED ORDER — FENTANYL CITRATE (PF) 100 MCG/2ML IJ SOLN
INTRAMUSCULAR | Status: AC
  Filled 2023-06-19: qty 2

## 2023-06-19 MED ORDER — PHENYLEPHRINE HCL-NACL 20-0.9 MG/250ML-% IV SOLN
INTRAVENOUS | Status: AC
  Filled 2023-06-19: qty 250

## 2023-06-19 MED ORDER — OXYCODONE HCL 5 MG PO TABS: 5.0000 mg | ORAL_TABLET | ORAL | 0 refills | Status: DC | PRN

## 2023-06-19 MED ORDER — CEFAZOLIN SODIUM-DEXTROSE 2-4 GM/100ML-% IV SOLN
INTRAVENOUS | Status: AC
  Filled 2023-06-19: qty 100

## 2023-06-19 MED ORDER — BUPIVACAINE-EPINEPHRINE (PF) 0.5% -1:200000 IJ SOLN
INTRAMUSCULAR | Status: AC
  Filled 2023-06-19: qty 10

## 2023-06-19 MED ORDER — DEXAMETHASONE SODIUM PHOSPHATE 10 MG/ML IJ SOLN
INTRAMUSCULAR | Status: DC | PRN
  Administered 2023-06-19: 5 mg via INTRAVENOUS

## 2023-06-19 MED ORDER — BUPIVACAINE HCL (PF) 0.5 % IJ SOLN
INTRAMUSCULAR | Status: AC
  Filled 2023-06-19: qty 10

## 2023-06-19 MED ORDER — PROPOFOL 10 MG/ML IV BOLUS
INTRAVENOUS | Status: AC
  Filled 2023-06-19: qty 20

## 2023-06-19 MED ORDER — BUPIVACAINE-EPINEPHRINE 0.5% -1:200000 IJ SOLN
INTRAMUSCULAR | Status: DC | PRN
  Administered 2023-06-19: 30 mL

## 2023-06-19 MED ORDER — ROCURONIUM BROMIDE 100 MG/10ML IV SOLN
INTRAVENOUS | Status: DC | PRN
  Administered 2023-06-19: 50 mg via INTRAVENOUS

## 2023-06-19 SURGICAL SUPPLY — 49 items
ANCHOR BONE REGENETEN (Anchor) IMPLANT
ANCHOR HEALICOIL REGEN 5.5 (Anchor) IMPLANT
ANCHOR QFIX 2.8 SUT MINI TAPE (Anchor) IMPLANT
ANCHOR SUT W/ ORTHOCORD (Anchor) IMPLANT
ANCHOR TENDON REGENETEN (Staple) IMPLANT
BIT DRILL JUGRKNT W/NDL BIT2.9 (DRILL) IMPLANT
BLADE FULL RADIUS 3.5 (BLADE) ×1 IMPLANT
BUR ACROMIONIZER 4.0 (BURR) ×1 IMPLANT
CHLORAPREP W/TINT 26 (MISCELLANEOUS) ×1 IMPLANT
COVER MAYO STAND STRL (DRAPES) ×1 IMPLANT
DILATOR 5.5 THREADED HEALICOIL (MISCELLANEOUS) IMPLANT
DRILL JUGGERKNOT W/NDL BIT 2.9 (DRILL)
ELECT CAUTERY BLADE 6.4 (BLADE) ×1 IMPLANT
ELECT REM PT RETURN 9FT ADLT (ELECTROSURGICAL) ×1
ELECTRODE REM PT RTRN 9FT ADLT (ELECTROSURGICAL) ×1 IMPLANT
GAUZE SPONGE 4X4 12PLY STRL (GAUZE/BANDAGES/DRESSINGS) ×1 IMPLANT
GAUZE XEROFORM 1X8 LF (GAUZE/BANDAGES/DRESSINGS) ×1 IMPLANT
GLOVE BIO SURGEON STRL SZ7.5 (GLOVE) ×2 IMPLANT
GLOVE BIO SURGEON STRL SZ8 (GLOVE) ×2 IMPLANT
GLOVE BIOGEL PI IND STRL 8 (GLOVE) ×1 IMPLANT
GLOVE INDICATOR 8.0 STRL GRN (GLOVE) ×1 IMPLANT
GOWN STRL REUS W/ TWL LRG LVL3 (GOWN DISPOSABLE) ×1 IMPLANT
GOWN STRL REUS W/ TWL XL LVL3 (GOWN DISPOSABLE) ×1 IMPLANT
GRASPER SUT 15 45D LOW PRO (SUTURE) IMPLANT
IMPL REGENETEN MEDIUM (Shoulder) IMPLANT
IMPLANT REGENETEN MEDIUM (Shoulder) ×1 IMPLANT
IV LR IRRIG 3000ML ARTHROMATIC (IV SOLUTION) ×2 IMPLANT
KIT CANNULA 8X76-LX IN CANNULA (CANNULA) ×1 IMPLANT
KIT SUTURE 2.8 Q-FIX DISP (MISCELLANEOUS) IMPLANT
MANIFOLD NEPTUNE II (INSTRUMENTS) ×1 IMPLANT
MASK FACE SPIDER DISP (MASK) ×1 IMPLANT
MAT ABSORB FLUID 56X50 GRAY (MISCELLANEOUS) ×1 IMPLANT
PACK ARTHROSCOPY SHOULDER (MISCELLANEOUS) ×1 IMPLANT
PAD ABD DERMACEA PRESS 5X9 (GAUZE/BANDAGES/DRESSINGS) ×2 IMPLANT
PASSER SUT FIRSTPASS SELF (INSTRUMENTS) IMPLANT
SLING ARM LRG DEEP (SOFTGOODS) ×1 IMPLANT
SLING ULTRA II LG (MISCELLANEOUS) ×1 IMPLANT
SPONGE T-LAP 18X18 ~~LOC~~+RFID (SPONGE) ×1 IMPLANT
STAPLER SKIN PROX 35W (STAPLE) ×1 IMPLANT
STRAP SAFETY 5IN WIDE (MISCELLANEOUS) ×1 IMPLANT
SUT ETHIBOND 0 MO6 C/R (SUTURE) ×1 IMPLANT
SUT ULTRABRAID 2 COBRAID 38 (SUTURE) IMPLANT
SUT VIC AB 2-0 CT1 TAPERPNT 27 (SUTURE) ×2 IMPLANT
TAPE MICROFOAM 4IN (TAPE) ×1 IMPLANT
TRAP FLUID SMOKE EVACUATOR (MISCELLANEOUS) ×1 IMPLANT
TUBE SET DOUBLEFLO INFLOW (TUBING) ×1 IMPLANT
TUBING CONNECTING 10 (TUBING) ×1 IMPLANT
WAND WEREWOLF FLOW 90D (MISCELLANEOUS) ×1 IMPLANT
WATER STERILE IRR 500ML POUR (IV SOLUTION) ×1 IMPLANT

## 2023-06-19 NOTE — Discharge Instructions (Addendum)
 Information for Discharge Teaching: EXPAREL  (bupivacaine  liposome injectable suspension)   Pain relief is important to your recovery. The goal is to control your pain so you can move easier and return to your normal activities as soon as possible after your procedure. Your physician may use several types of medicines to manage pain, swelling, and more.  Your surgeon or anesthesiologist gave you EXPAREL (bupivacaine ) to help control your pain after surgery.  EXPAREL  is a local anesthetic designed to release slowly over an extended period of time to provide pain relief by numbing the tissue around the surgical site. EXPAREL  is designed to release pain medication over time and can control pain for up to 72 hours. Depending on how you respond to EXPAREL , you may require less pain medication during your recovery. EXPAREL  can help reduce or eliminate the need for opioids during the first few days after surgery when pain relief is needed the most. EXPAREL  is not an opioid and is not addictive. It does not cause sleepiness or sedation.   Important! A teal colored band has been placed on your arm with the date, time and amount of EXPAREL  you have received. Please leave this armband in place for the full 96 hours following administration, and then you may remove the band. If you return to the hospital for any reason within 96 hours following the administration of EXPAREL , the armband provides important information that your health care providers to know, and alerts them that you have received this anesthetic.    Possible side effects of EXPAREL : Temporary loss of sensation or ability to move in the area where medication was injected. Nausea, vomiting, constipation Rarely, numbness and tingling in your mouth or lips, lightheadedness, or anxiety may occur. Call your doctor right away if you think you may be experiencing any of these sensations, or if you have other questions regarding possible side  effects.  Follow all other discharge instructions given to you by your surgeon or nurse. Eat a healthy diet and drink plenty of water  or other fluids.Information for Discharge Teaching: EXPAREL  (bupivacaine  liposome injectable suspension)   Pain relief is important to your recovery. The goal is to control your pain so you can move easier and return to your normal activities as soon as possible after your procedure. Your physician may use several types of medicines to manage pain, swelling, and more.  Your surgeon or anesthesiologist gave you EXPAREL (bupivacaine ) to help control your pain after surgery.  EXPAREL  is a local anesthetic designed to release slowly over an extended period of time to provide pain relief by numbing the tissue around the surgical site. EXPAREL  is designed to release pain medication over time and can control pain for up to 72 hours. Depending on how you respond to EXPAREL , you may require less pain medication during your recovery. EXPAREL  can help reduce or eliminate the need for opioids during the first few days after surgery when pain relief is needed the most. EXPAREL  is not an opioid and is not addictive. It does not cause sleepiness or sedation.   Important! A teal colored band has been placed on your arm with the date, time and amount of EXPAREL  you have received. Please leave this armband in place for the full 96 hours following administration, and then you may remove the band. If you return to the hospital for any reason within 96 hours following the administration of EXPAREL , the armband provides important information that your health care providers to know, and alerts them that you  have received this anesthetic.    Possible side effects of EXPAREL : Temporary loss of sensation or ability to move in the area where medication was injected. Nausea, vomiting, constipation Rarely, numbness and tingling in your mouth or lips, lightheadedness, or anxiety may occur. Call  your doctor right away if you think you may be experiencing any of these sensations, or if you have other questions regarding possible side effects.  Follow all other discharge instructions given to you by your surgeon or nurse. Eat a healthy diet and drink plenty of water  or other fluids.  SHOULDER SLING IMMOBILIZER   VIDEO Slingshot 2 Shoulder Brace Application - YouTube ---[email or web address].[email or web address]  INSTRUCTIONS While supporting the injured arm, slide the forearm into the sling. Wrap the adjustable shoulder strap around the neck and shoulders and attach the strap end to the sling using  the "alligator strap tab."  Adjust the shoulder strap to the required length. Position the shoulder pad behind the neck. To secure the shoulder pad location (optional), pull the shoulder strap away from the shoulder pad, unfold the hook material on the top of the pad, then press the shoulder strap back onto the hook material to secure the pad in place. Attach the closure strap across the open top of the sling. Position the strap so that it holds the arm securely in the sling. Next, attach the thumb strap to the open end of the sling between the thumb and fingers. After sling has been fit, it may be easily removed and reapplied using the quick release buckle on shoulder strap. If a neutral pillow or 15 abduction pillow is included, place the pillow at the waistline. Attach the sling to the pillow, lining up hook material on the pillow with the loop on sling. Adjust the waist strap to fit.  If waist strap is too long, cut it to fit. Use the small piece of double sided hook material (located on top of the pillow) to secure the strap end. Place the double sided hook material on the inside of the cut strap end and secure it to the waist strap.     If no pillow is included, attach the waist strap to the sling and adjust to fit.    Washing Instructions: Straps and sling must be removed and cleaned  regularly depending on your activity level and perspiration. Hand wash straps and sling in cold water  with mild detergent, rinse, air dry      Orthopedic discharge instructions: Keep dressing dry and intact.  May shower after dressing changed on post-op day #4 (Saturday).  Cover staples/sutures with Band-Aids after drying off. Apply ice frequently to shoulder. Take oxycodone  as prescribed when needed.  May supplement with ES Tylenol  if necessary. Keep shoulder immobilizer on at all times except may remove for bathing purposes. Follow-up in 10-14 days or as scheduled.

## 2023-06-19 NOTE — Anesthesia Procedure Notes (Signed)
 Anesthesia Regional Block: Interscalene brachial plexus block   Pre-Anesthetic Checklist: , timeout performed,  Correct Patient, Correct Site, Correct Laterality,  Correct Procedure, Correct Position, site marked,  Risks and benefits discussed,  Surgical consent,  Pre-op evaluation,  At surgeon's request and post-op pain management  Laterality: Upper and Right  Prep: chloraprep       Needles:  Injection technique: Single-shot  Needle Type: Stimiplex     Needle Length: 9cm  Needle Gauge: 22     Additional Needles:   Procedures:,,,, ultrasound used (permanent image in chart),,    Narrative:  Start time: 06/19/2023 10:10 AM End time: 06/19/2023 10:13 AM Injection made incrementally with aspirations every 5 mL.  Performed by: Personally   Additional Notes: Patient reports baseline deficits in his right arm and hand from a previous stroke  Patient consented for risk and benefits of nerve block including but not limited to nerve damage, Horner's syndrome, failed block, bleeding and infection.  Patient voiced understanding.  Functioning IV was confirmed and monitors were applied.  Timeout done prior to procedure and prior to any sedation being given to the patient.  Patient confirmed procedure site prior to any sedation given to the patient.  A 50mm 22ga Stimuplex needle was used. Sterile prep,hand hygiene and sterile gloves were used.  Minimal sedation used for procedure.  No paresthesia endorsed by patient during the procedure.  Negative aspiration and negative test dose prior to incremental administration of local anesthetic. The patient tolerated the procedure well with no immediate complications.

## 2023-06-19 NOTE — Anesthesia Postprocedure Evaluation (Signed)
 Anesthesia Post Note  Patient: WESSLEY EMERT  Procedure(s) Performed: RIGHT SHOULDER ARTHROSCOPY WITH DEBRIDEMENT, DECOMPRESSION, ROTATOR CUFF REPAIR (Right: Shoulder)  Patient location during evaluation: PACU Anesthesia Type: General Level of consciousness: awake and alert Pain management: pain level controlled Vital Signs Assessment: post-procedure vital signs reviewed and stable Respiratory status: spontaneous breathing, nonlabored ventilation, respiratory function stable and patient connected to nasal cannula oxygen Cardiovascular status: blood pressure returned to baseline and stable Postop Assessment: no apparent nausea or vomiting Anesthetic complications: no   No notable events documented.   Last Vitals:  Vitals:   06/19/23 1345 06/19/23 1402  BP: (!) 141/76 133/89  Pulse: 63 69  Resp: 17 15  Temp: (!) 36.3 C 36.7 C  SpO2: 94% 96%    Last Pain:  Vitals:   06/19/23 1402  TempSrc: Temporal  PainSc: 0-No pain                 Fairy POUR Chaun Uemura

## 2023-06-19 NOTE — Op Note (Signed)
 06/19/2023  12:34 PM  Patient:   Warren Ingram  Pre-Op Diagnosis:   Impingement/tendinopathy with rotator cuff tear, right shoulder.  Post-Op Diagnosis:   Impingement/tendinopathy with full-thickness rotator cuff tear, degenerative labral fraying, early degenerative joint disease, and biceps tendinopathy, right shoulder.  Procedure:   Extensive arthroscopic debridement, arthroscopic repair of subscapularis tendon tear arthroscopic subacromial decompression, and mini-open repair of supraspinatus tendon tear with Regeneten patch, right shoulder.  Anesthesia:   General endotracheal with interscalene block using Exparel  placed preoperatively by the anesthesiologist.  Surgeon:   Warren Reyes Maltos, MD  Assistant:   Gustavo Level, PA-C; Windell Britain, PA-S  Findings:   As above. There was a full-thickness tear involving the anterior and middle portions of the supraspinatus insertional fibers, as well as an articular sided partial-thickness tear of the upper portion of the subscapularis tendon involving approximately 50% of the footprint. The remainder of the rotator cuff was in satisfactory condition. The biceps tendon was noted to be chronically ruptured and retracted distally. There were grade 2-3 chondromalacial changes involving the central portion of the glenoid, and grade 2 chondromalacial changes involving the humeral head. There was moderate labral fraying involving the anterior, superior, posterior superior portions of the labrum without frank detachment from the glenoid rim.  Complications:   None  Fluids:   10 cc  Estimated blood loss:   800 cc  Tourniquet time:   None  Drains:   None  Closure:   Staples      Brief clinical note:   The patient is an 83 year old male with a history of progressively worsening right shoulder pain and weakness.  The patient's symptoms have progressed despite medications, activity modification, etc. The patient's history and examination are  consistent with impingement/tendinopathy with a rotator cuff tear. These findings were confirmed by MRI scan. The patient presents at this time for definitive management of these shoulder symptoms.  Procedure:   The patient underwent placement of an interscalene block using Exparel  by the anesthesiologist in the preoperative holding area before being brought into the operating room and lain in the supine position. The patient then underwent general endotracheal intubation and anesthesia before being repositioned in the beach chair position using the beach chair positioner. The right shoulder and upper extremity were prepped with ChloraPrep solution before being draped sterilely. Preoperative antibiotics were administered. A timeout was performed to confirm the proper surgical site before the expected portal sites and incision site were injected with 0.5% Sensorcaine  with epinephrine .   A posterior portal was created and the glenohumeral joint thoroughly inspected with the findings as described above. An anterior portal was created using an outside-in technique. The labrum and rotator cuff were further probed, again confirming the above-noted findings. The areas of labral fraying were debrided back to stable margins using the full-radius resector, as were the torn portions of the rotator cuff tears. In addition, the areas of unstable articular cartilage on both the glenoid and humerus were debrided back to stable margins using the full-radius resector. Finally, areas of synovitis were debrided back to stable margins using the full-radius resector. The ArthroCare wand was inserted and used to obtain hemostasis as well as to anneal the labrum superiorly and anteriorly.   A separate superolateral portal was created using an outside in technique to serve as a working portal.  The full-radius resector was used to abrade the exposed portion of the lesser tuberosity to optimize healing before the partial-thickness  subscapularis tendon tear was repaired using a single  Mitek BioKnotless anchor. The adequacy of repair was verified both by probing as well as with passive external rotation. The instruments were removed from the joint after suctioning the excess fluid.  The camera was repositioned through the posterior portal into the subacromial space. A separate lateral portal was created using an outside-in technique. The 3.5 mm full-radius resector was introduced and used to perform a subtotal bursectomy. The ArthroCare wand was then inserted and used to remove the periosteal tissue off the undersurface of the anterior third of the acromion as well as to recess the coracoacromial ligament from its attachment along the anterior and lateral margins of the acromion. The 4.0 mm acromionizing bur was introduced and used to complete the decompression by removing the undersurface of the anterior third of the acromion. The full radius resector was reintroduced to remove any residual bony debris before the ArthroCare wand was reintroduced to obtain hemostasis. The instruments were then removed from the subacromial space after suctioning the excess fluid.  An approximately 4-5 cm incision was made over the anterolateral aspect of the shoulder beginning at the anterolateral corner of the acromion and extending distally in line with the bicipital groove. This incision was carried down through the subcutaneous tissues to expose the deltoid fascia. The raphae between the anterior and middle thirds was identified and this plane developed to provide access into the subacromial space. Additional bursal tissues were debrided sharply using Metzenbaum scissors. The rotator cuff tear was readily identified.   The margins of the rotator cuff tear were debrided sharply with a #15 blade and the exposed greater tuberosity roughened with a rongeur. The tear was repaired using two Smith [employer] Nephew 2.8 mm Q-Fix anchors. These sutures were then brought  back laterally and secured using two Smith [employer] Nephew Healicoil knotless RegeneSorb anchors to create a two-layer closure. An apparent watertight closure was obtained.    Given the age of the patient, the quality of the tissue, and the fact that the entire greater tuberosity footprint was unable to be covered with the rotator cuff tendon tissue, as felt best to augment the repair with a Smith [employer] Nephew Regeneten patch. Therefore, a medium sized patch was selected and applied over the repair site before being secured using appropriate bone and soft tissue staples. This procedure added a measure of complexity and added an extra 20 to 25 minutes to the case.  The wound was copiously irrigated with sterile saline solution before the deltoid raphae was reapproximated using 2-0 Vicryl interrupted sutures. The subcutaneous tissues were closed in two layers using 2-0 Vicryl interrupted sutures before the skin was closed using staples. The portal sites also were closed using staples. A sterile bulky dressing was applied to the shoulder before the arm was placed into a shoulder immobilizer. The patient was then awakened, extubated, and returned to the recovery room in satisfactory condition after tolerating the procedure well.

## 2023-06-19 NOTE — Anesthesia Preprocedure Evaluation (Addendum)
 Anesthesia Evaluation  Patient identified by MRN, date of birth, ID band Patient awake    Reviewed: Allergy & Precautions, NPO status , Patient's Chart, lab work & pertinent test results  History of Anesthesia Complications Negative for: history of anesthetic complications  Airway Mallampati: III  TM Distance: <3 FB Neck ROM: full    Dental  (+) Poor Dentition, Missing, Chipped   Pulmonary neg COPD, former smoker   Pulmonary exam normal        Cardiovascular Exercise Tolerance: Good hypertension, (-) angina + CAD, + Cardiac Stents and + CABG  + dysrhythmias Atrial Fibrillation + Valvular Problems/Murmurs  Rhythm:irregular Rate:Normal     Neuro/Psych  Headaches TIACVA (right hand and arm), Residual Symptoms  negative psych ROS   GI/Hepatic Neg liver ROS, hiatal hernia,GERD  Controlled,,  Endo/Other  diabetes, Type 2Hypothyroidism    Renal/GU      Musculoskeletal   Abdominal   Peds  Hematology negative hematology ROS (+)   Anesthesia Other Findings Patient has cardiac clearance for this procedure.   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 No date: Benign neoplasm of abdomen No date: Bilateral carotid artery disease (HCC)     Comment:  a.) doppler 07/21/2013: <50 % BICA; b.) 10/24/2021 -->               radiographic string sign LICA --> PTA with stenting (10 x              8 x 40 Exact stent; c.)  doppler 12/16/2021, 03/13/2022,               09/11/2022: 40-59% RICA, 1-39% LICA 03/30/2009: Coronary artery disease     Comment:  a.) LHC/PCI 03/30/2009: 75% mLCX (PTCA), 95% OM1 (2.75 x              15 mm Xience V DES); b.) LHC 07/25/2017: 100% pRCA, 95%               p-mLAD, 10% oOM2, 90% lat OM2, 90% m-dLCx --> CVTS               consult; c.) s/p 4v CABG 07/26/2019: Diastolic dysfunction     Comment:  a.) bubble study 07/26/2019: EF 55-60%, no RWMAs, G1DD,               triv TR; b.) TTE 07/23/2016: EF >55%, LVH, mild LAE, AoV               sclerosis, mildly elevated PASP, G1DD; c.) TTE               05/16/2022: EF >55%, mod LVH, mod BAE, mod RVE,  mod AS),               triv MR, mod TR, mild PR; d.) TTE 01/09/2023: EF >55%,               LVH, mild RVE, sev RAE, triv AR, mild AS, mild MR, sev TR              (RVSP 39), triv PR No date: Diverticulitis No date: GERD (gastroesophageal reflux disease) No date: Headache No date: Heart murmur No date: History of hiatal hernia No date: Hypercholesterolemia No date: Hypertension No date: Hypothyroidism 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)     Comment:  a.) MRI brain 07/25/2019: acute lateral pontine infarct               measuring 10 mm No date: Long term current use of clopidogrel  07/25/2019: Multiple lacunar infarcts [facility])     Comment:  a.) MRI brain 07/25/2019: remote lacunar infact of the               RIGHT lentiform nucleus and BILATERAL thalami No date: Nontraumatic complete tear of rotator cuff, right No date: On apixaban  therapy 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     Comment:  a.) LIMA-LAD, SVG-PDA, SVG-D1, SVG-OM1; b.) POD1 patient              with STE in V2-V3 --> Tx'd with colchicine for               pericarditis No date: Skin cancer No date: T2DM (type 2 diabetes mellitus) (HCC) 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)     Comment:  a.) MRI brain 04/05/2022: small acute LEFT thalamic               infarct 2015: TIA (transient  ischemic attack) No date: Tubular adenoma  Past Surgical History: 11/08/2021: CAROTID PTA/STENT INTERVENTION; Left     Comment:  Procedure: CAROTID PTA/STENT INTERVENTION;  Surgeon:               Jama Cordella MATSU, MD;  Location: [facility] INVASIVE CV LAB;               Service: Cardiovascular;  Laterality: Left; 01/01/2018: COLONOSCOPY WITH PROPOFOL ; N/A     Comment:  Procedure: COLONOSCOPY WITH PROPOFOL ;  Surgeon: Therisa Bi, MD;  Location: [facility] ENDOSCOPY;  Service:               Gastroenterology;  Laterality: N/A; 03/30/2009: CORONARY ANGIOPLASTY WITH STENT PLACEMENT; Left     Comment:  Procedure: CORONARY ANGIOPLASTY WITH STENT PLACEMENT;               Location: [facility]; Surgeons: Vinie Jude, MD (diagnostic)               and Marsa Dooms, MD (interventional) 08/06/2017: CORONARY ARTERY BYPASS GRAFT     Comment:  4 vessels 10/28/2015: ESOPHAGOGASTRODUODENOSCOPY (EGD) WITH PROPOFOL ; N/A     Comment:  Procedure: ESOPHAGOGASTRODUODENOSCOPY (EGD) WITH               PROPOFOL ;  Surgeon: Deward CINDERELLA Piedmont, MD;  Location: [facility]               ENDOSCOPY;  Service: Gastroenterology;  Laterality: N/A; No date: KNEE ARTHROSCOPY 07/25/2017: LEFT HEART CATH AND CORONARY ANGIOGRAPHY; N/A     Comment:  Procedure: LEFT HEART CATH AND CORONARY ANGIOGRAPHY;  Surgeon: Bosie Vinie LABOR, MD;  Location: [facility] INVASIVE CV              LAB;  Service: Cardiovascular;  Laterality: N/A; No date: NASAL SINUS SURGERY 06/27/2019: PARTIAL KNEE ARTHROPLASTY; Right     Comment:  Procedure: UNICOMPARTMENTAL KNEE;  Surgeon: Edie Norleen PARAS, MD;  Location: [facility] ORS;  Service: Orthopedics;                Laterality: Right; 12/19/2018: TOTAL KNEE ARTHROPLASTY; Left     Comment:  Procedure: TOTAL KNEE ARTHROPLASTY;  Surgeon: Edie Norleen PARAS, MD;  Location: [facility] ORS;  Service: Orthopedics;                Laterality: Left; No date: VASECTOMY  BMI    Body Mass Index: 29.13  kg/m      Reproductive/Obstetrics negative OB ROS                             Anesthesia Physical Anesthesia Plan  ASA: 3  Anesthesia Plan: General ETT   Post-op Pain Management: Regional block*   Induction: Intravenous  PONV Risk Score and Plan: Ondansetron , Dexamethasone , Midazolam  and Treatment may vary due to age or medical condition  Airway Management Planned: Oral ETT  Additional Equipment:   Intra-op Plan:   Post-operative Plan: Extubation in OR  Informed Consent: I have reviewed the patients History and Physical, chart, labs and discussed the procedure including the risks, benefits and alternatives for the proposed anesthesia with the patient or authorized representative who has indicated his/her understanding and acceptance.     Dental Advisory Given  Plan Discussed with: Anesthesiologist, CRNA and Surgeon  Anesthesia Plan Comments: (Patient consented for risks of anesthesia including but not limited to:  - adverse reactions to medications - damage to eyes, teeth, lips or other oral mucosa - nerve damage due to positioning  - sore throat or hoarseness - Damage to heart, brain, nerves, lungs, other parts of body or loss of life  Patient voiced understanding and assent.)       Anesthesia Quick Evaluation

## 2023-06-19 NOTE — H&P (Signed)
 History of Present Illness:  Warren Ingram is a 83 y.o. male who has severe right shoulder pain. He has been following up with Dr. Edie regularly for right shoulder pain and discomfort. 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 He had very little improvement with any conservative interventions. He reports that he still uses Tylenol  and topical Biofreeze with little benefit. An MRI was ordered, which Dr. Edie found to have a full-thickness tear of his supraspinatus tendon with potential 2 cm of retraction. Given his little relief in symptoms with conservative treatments, patient has agreed to proceed with surgical intervention to have this rotator cuff repaired. He states that he has a rather significant cardiac history, states that he had a quadruple bypass done in 2019 as well as 2 stents placed. Also had a recent stent placed into his left carotid artery 4 months ago. Patient denies having any known pulmonary issues. 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 He denies any history of DVTs. Patient is noted to be right-hand dominant  Social Hx: Patient lives at home with his wife. He states that he has a daughter that lives close by. He admits to having a few standard glasses of alcohol every couple of days. Denies any illicit drug use, tobacco or nicotine use.  Allergies: No Known Allergies  Current Outpatient Medications:acetaminophen  (TYLENOL ) 325 MG tablet Take 500 mg by mouth as needed for Pain  amLODIPine  (NORVASC ) 5 MG tablet TAKE 1 TABLET EVERY NIGHT 90 tablet 3  apixaban  (ELIQUIS ) 5 mg tablet Take 1 tablet (5 mg total) by mouth every 12 (twelve) hours 180 tablet 3  atorvastatin  (LIPITOR ) 80 MG tablet take 1 tablet every day 90 tablet 3  clobetasoL (TEMOVATE) 0.05 % ointment Apply 0.05 % topically once daily as needed  clopidogreL  (PLAVIX ) 75 mg tablet Take 1 tablet (75 mg total) by mouth once daily 90 tablet 3  fenofibrate   nanocrystallized (TRICOR ) 145 MG tablet TAKE 1 TABLET ONE TIME DAILY 90 tablet 3  levothyroxine  (SYNTHROID ) 150 MCG tablet Take 1 tablet (150 mcg total) by mouth every morning before breakfast (0630) ON AN EMPTY STOMACH WITH A GLASS OF WATER  AT LEAST 30 TO 60 MINUTES BEFORE BREAKFAST 90 tablet 3  lisinopriL  (ZESTRIL ) 10 MG tablet TAKE 1 TABLET EVERY EVENING 90 tablet 3  metoprolol  tartrate (LOPRESSOR ) 50 MG tablet TAKE 1 TABLET TWICE DAILY 180 tablet 3  omega-3 fatty acids/fish oil 340-1,000 mg capsule Take 2 capsules by mouth 2 (two) times daily 1200 mg 2x daily  omeprazole  (PRILOSEC) 20 MG DR capsule TAKE 1 CAPSULE EVERY DAY 90 capsule 3  tamsulosin  (FLOMAX ) 0.4 mg capsule TAKE 1 CAPSULE EVERY DAY 90 capsule 3  triamcinolone  0.5 % cream Apply topically 2 (two) times daily 454 g 3   Past Medical History:  Barrett's esophagus 03/10/2010  06/25/2014 long segment  Benign neoplasm of colon 08/21/2013  Coronary atherosclerosis of native coronary artery 08/21/2013  Diverticulitis of colon (without mention of hemorrhage)(562.11) 08/21/2013  Esophageal reflux 08/21/2013  Essential hypertension, benign 08/21/2013  Heart murmur  Hemorrhage of rectum and anus 08/21/2013  History of stroke  Osteoarthrosis, unspecified whether generalized or localized, lower leg 08/21/2013  Osteoarthrosis, unspecified whether generalized or localized, pelvic region and thigh 08/21/2013  Personal history of colonic polyps 2007, 2011 (+TA)  Pure hypercholesterolemia 08/21/2013  Unspecified hypothyroidism 08/21/2013   Past Surgical History:  Right unicondylar knee arthroplasty. Right 28-Aug-1940 (Dr. Edie)  FLEXIBLE SIGMOIDOSCOPY 04/30/1996 (Diverticulosis)  COLONOSCOPY 06/25/2014 (  Tubular Adenoma/Repeat 62yrs/PYO)  EGD 06/25/2014 (Long Segment Barrett's/PYO)  EGD with BARRX 10/28/2015 (Barrett's esophagus treated with BARRX/Repeat BARRX in 2 to 3 months/RTE)  CORONARY ARTERY BYPASS GRAFT 08/06/2017  JOINT REPLACEMENT 11/19/2018  (Left TKA using all cemented biomet [employer] system with a 70 mm PCR femur, a 75 mm tibial tray with a 10 mm E-poly insert and a 34x 8.5 mm all-poly 3 pegged domed patella Left 12/19/2018 (Dr. Edie))  ARTHROSCOPY KNEE Left  COLONOSCOPY 12/04/2005,03/10/2010 (+TA)  KNEE ARTHROSCOPY  STENT PLACEMENT INTRACRANIAL PERCUTANEOUS  upper endoscopy 12/04/2005, 03/10/2010 (+ Barrett's)  VASECTOMY   Physical Exam:  Ht:162.6 cm (5' 4) Wt:82.6 kg (182 lb) BMI: Body mass index is 31.24 kg/m.  General/Constitutional: No apparent distress: well-nourished and well developed. Eyes: Pupils equal, round with synchronous movement. Lymphatic: No palpable adenopathy. Respiratory: Patient has good chest rise and fall with inspiration and expiration. All lung fields are clear to auscultation bilaterally. There is no Rales, rhonchi or wheezes appreciated. Cardiovascular: Upon auscultation there is a regular rate and rhythm without any murmurs, rubs, gallops or heaves appreciated. There does not appear to be any swelling down the lower extremities. Posterior tibial pulses appreciated bilaterally, 2+. Integumentary: No impressive skin lesions present, except as noted in detailed exam. Neuro/Psych: Normal mood and affect, oriented to person, place and time. Musculoskeletal: see exam below  Imaging:  A recent MRI scan of the right shoulder is available for review and has been reviewed by myself. Overall the formal report is not yet available, by my review, the patient has a full-thickness tear of the supraspinatus tendon with approximately 2 cm of retraction. There may be some involvement of the anterior fibers of the infraspinatus tendon, as well as tendinopathic changes of the long head of the biceps. No significant degenerative changes of the glenohumeral joint are noted. This information was discussed with the patient and his wife.   Assessment:  1. Full-thickness supraspinatus tear of right shoulder.  Plan:  The  treatment options were discussed with the patient and his wife. In addition, patient educational materials were provided regarding the diagnosis and treatment options. The patient is quite frustrated by his symptoms and functional limitations, and is ready to consider more aggressive treatment options. Therefore, I have recommended a surgical procedure, specifically a right shoulder arthroscopy with debridement, decompression, rotator cuff repair, and biceps tenodesis. The procedure was discussed with the patient, as were the potential risks (including bleeding, infection, nerve and/or blood vessel injury, persistent or recurrent pain, failure of the repair, progression of arthritis, need for further surgery, blood clots, strokes, heart attacks and/or arhythmias, pneumonia, etc.) and benefits. The patient states his understanding and wishes to proceed. All of the patient's questions and concerns were answered. He can call any time with further concerns. He will follow up post-surgery, routine.    H&P reviewed and patient re-examined. No changes.

## 2023-06-19 NOTE — Anesthesia Procedure Notes (Signed)
 Procedure Name: Intubation Date/Time: 06/19/2023 10:56 AM  Performed by: Lennie Lamarr HERO, CRNAPre-anesthesia Checklist: Patient identified, Emergency Drugs available, Suction available and Patient being monitored Patient Re-evaluated:Patient Re-evaluated prior to induction Oxygen Delivery Method: Circle System Utilized Preoxygenation: Pre-oxygenation with 100% oxygen Induction Type: IV induction Ventilation: Mask ventilation without difficulty Laryngoscope Size: McGrath and 4 Grade View: Grade I Tube type: Oral Tube size: 7.5 mm Number of attempts: 1 Airway Equipment and Method: Stylet and Oral airway Placement Confirmation: ETT inserted through vocal cords under direct vision, positive ETCO2 and breath sounds checked- equal and bilateral Secured at: 21 cm Tube secured with: Tape Dental Injury: Teeth and Oropharynx as per pre-operative assessment

## 2023-06-19 NOTE — Transfer of Care (Signed)
 Immediate Anesthesia Transfer of Care Note  Patient: Warren Ingram  Procedure(s) Performed: RIGHT SHOULDER ARTHROSCOPY WITH DEBRIDEMENT, DECOMPRESSION, ROTATOR CUFF REPAIR, AND BICEPS TENODESIS. (Right: Shoulder)  Patient Location: PACU  Anesthesia Type:General  Level of Consciousness: drowsy and patient cooperative  Airway & Oxygen Therapy: Patient Spontanous Breathing and Patient connected to face mask oxygen  Post-op Assessment: Report given to RN, Post -op Vital signs reviewed and stable, and Patient moving all extremities X 4  Post vital signs: Reviewed and stable  Last Vitals:  Vitals Value Taken Time  BP 127/81 06/19/23 1251  Temp    Pulse 74 06/19/23 1253  Resp 18 06/19/23 1253  SpO2 98 % 06/19/23 1253  Vitals shown include unfiled device data.  Last Pain:  Vitals:   06/19/23 0921  TempSrc: Temporal  PainSc: 0-No pain      Patients Stated Pain Goal: 0 (06/19/23 0921)  Complications: No notable events documented.

## 2023-06-20 ENCOUNTER — Encounter: Payer: Self-pay | Admitting: Surgery

## 2023-06-26 DIAGNOSIS — M25611 Stiffness of right shoulder, not elsewhere classified: Secondary | ICD-10-CM | POA: Diagnosis not present

## 2023-06-26 DIAGNOSIS — G8929 Other chronic pain: Secondary | ICD-10-CM | POA: Diagnosis not present

## 2023-06-26 DIAGNOSIS — M6281 Muscle weakness (generalized): Secondary | ICD-10-CM | POA: Diagnosis not present

## 2023-06-26 DIAGNOSIS — M75121 Complete rotator cuff tear or rupture of right shoulder, not specified as traumatic: Secondary | ICD-10-CM | POA: Diagnosis not present

## 2023-06-26 DIAGNOSIS — M25511 Pain in right shoulder: Secondary | ICD-10-CM | POA: Diagnosis not present

## 2023-06-26 DIAGNOSIS — M7581 Other shoulder lesions, right shoulder: Secondary | ICD-10-CM | POA: Diagnosis not present

## 2023-06-30 ENCOUNTER — Other Ambulatory Visit (HOSPITAL_COMMUNITY): Payer: Self-pay

## 2023-07-02 ENCOUNTER — Other Ambulatory Visit: Payer: Self-pay

## 2023-07-03 ENCOUNTER — Other Ambulatory Visit: Payer: Self-pay

## 2023-07-03 ENCOUNTER — Other Ambulatory Visit (HOSPITAL_COMMUNITY): Payer: Self-pay

## 2023-07-11 DIAGNOSIS — G8929 Other chronic pain: Secondary | ICD-10-CM | POA: Diagnosis not present

## 2023-07-11 DIAGNOSIS — M7581 Other shoulder lesions, right shoulder: Secondary | ICD-10-CM | POA: Diagnosis not present

## 2023-07-11 DIAGNOSIS — M75121 Complete rotator cuff tear or rupture of right shoulder, not specified as traumatic: Secondary | ICD-10-CM | POA: Diagnosis not present

## 2023-07-11 DIAGNOSIS — M25511 Pain in right shoulder: Secondary | ICD-10-CM | POA: Diagnosis not present

## 2023-07-11 DIAGNOSIS — M25611 Stiffness of right shoulder, not elsewhere classified: Secondary | ICD-10-CM | POA: Diagnosis not present

## 2023-07-16 ENCOUNTER — Other Ambulatory Visit: Payer: Self-pay

## 2023-07-17 ENCOUNTER — Other Ambulatory Visit: Payer: Self-pay

## 2023-07-18 ENCOUNTER — Other Ambulatory Visit (HOSPITAL_COMMUNITY): Payer: Self-pay

## 2023-07-19 DIAGNOSIS — M7581 Other shoulder lesions, right shoulder: Secondary | ICD-10-CM | POA: Diagnosis not present

## 2023-07-19 DIAGNOSIS — M25611 Stiffness of right shoulder, not elsewhere classified: Secondary | ICD-10-CM | POA: Diagnosis not present

## 2023-07-19 DIAGNOSIS — M75121 Complete rotator cuff tear or rupture of right shoulder, not specified as traumatic: Secondary | ICD-10-CM | POA: Diagnosis not present

## 2023-07-19 DIAGNOSIS — G8929 Other chronic pain: Secondary | ICD-10-CM | POA: Diagnosis not present

## 2023-07-19 DIAGNOSIS — M25511 Pain in right shoulder: Secondary | ICD-10-CM | POA: Diagnosis not present

## 2023-07-23 ENCOUNTER — Other Ambulatory Visit (HOSPITAL_BASED_OUTPATIENT_CLINIC_OR_DEPARTMENT_OTHER): Payer: Self-pay

## 2023-08-01 DIAGNOSIS — E7800 Pure hypercholesterolemia, unspecified: Secondary | ICD-10-CM | POA: Diagnosis not present

## 2023-08-01 DIAGNOSIS — M7581 Other shoulder lesions, right shoulder: Secondary | ICD-10-CM | POA: Diagnosis not present

## 2023-08-01 DIAGNOSIS — E669 Obesity, unspecified: Secondary | ICD-10-CM | POA: Diagnosis not present

## 2023-08-01 DIAGNOSIS — K2100 Gastro-esophageal reflux disease with esophagitis, without bleeding: Secondary | ICD-10-CM | POA: Diagnosis not present

## 2023-08-01 DIAGNOSIS — I480 Paroxysmal atrial fibrillation: Secondary | ICD-10-CM | POA: Diagnosis not present

## 2023-08-01 DIAGNOSIS — Z8673 Personal history of transient ischemic attack (TIA), and cerebral infarction without residual deficits: Secondary | ICD-10-CM | POA: Diagnosis not present

## 2023-08-01 DIAGNOSIS — R600 Localized edema: Secondary | ICD-10-CM | POA: Diagnosis not present

## 2023-08-01 DIAGNOSIS — M25511 Pain in right shoulder: Secondary | ICD-10-CM | POA: Diagnosis not present

## 2023-08-01 DIAGNOSIS — M75121 Complete rotator cuff tear or rupture of right shoulder, not specified as traumatic: Secondary | ICD-10-CM | POA: Diagnosis not present

## 2023-08-01 DIAGNOSIS — M25611 Stiffness of right shoulder, not elsewhere classified: Secondary | ICD-10-CM | POA: Diagnosis not present

## 2023-08-01 DIAGNOSIS — I10 Essential (primary) hypertension: Secondary | ICD-10-CM | POA: Diagnosis not present

## 2023-08-01 DIAGNOSIS — G4730 Sleep apnea, unspecified: Secondary | ICD-10-CM | POA: Diagnosis not present

## 2023-08-01 DIAGNOSIS — G8929 Other chronic pain: Secondary | ICD-10-CM | POA: Diagnosis not present

## 2023-08-08 DIAGNOSIS — R21 Rash and other nonspecific skin eruption: Secondary | ICD-10-CM | POA: Diagnosis not present

## 2023-08-08 DIAGNOSIS — Z8673 Personal history of transient ischemic attack (TIA), and cerebral infarction without residual deficits: Secondary | ICD-10-CM | POA: Diagnosis not present

## 2023-08-08 DIAGNOSIS — R0602 Shortness of breath: Secondary | ICD-10-CM | POA: Diagnosis not present

## 2023-08-08 DIAGNOSIS — E669 Obesity, unspecified: Secondary | ICD-10-CM | POA: Diagnosis not present

## 2023-08-08 DIAGNOSIS — K2100 Gastro-esophageal reflux disease with esophagitis, without bleeding: Secondary | ICD-10-CM | POA: Diagnosis not present

## 2023-08-08 DIAGNOSIS — I480 Paroxysmal atrial fibrillation: Secondary | ICD-10-CM | POA: Diagnosis not present

## 2023-08-08 DIAGNOSIS — R600 Localized edema: Secondary | ICD-10-CM | POA: Diagnosis not present

## 2023-08-08 DIAGNOSIS — I10 Essential (primary) hypertension: Secondary | ICD-10-CM | POA: Diagnosis not present

## 2023-08-08 DIAGNOSIS — E7800 Pure hypercholesterolemia, unspecified: Secondary | ICD-10-CM | POA: Diagnosis not present

## 2023-08-13 DIAGNOSIS — E039 Hypothyroidism, unspecified: Secondary | ICD-10-CM | POA: Diagnosis not present

## 2023-08-13 DIAGNOSIS — I10 Essential (primary) hypertension: Secondary | ICD-10-CM | POA: Diagnosis not present

## 2023-08-13 DIAGNOSIS — R7303 Prediabetes: Secondary | ICD-10-CM | POA: Diagnosis not present

## 2023-08-13 DIAGNOSIS — I480 Paroxysmal atrial fibrillation: Secondary | ICD-10-CM | POA: Diagnosis not present

## 2023-08-13 DIAGNOSIS — I2510 Atherosclerotic heart disease of native coronary artery without angina pectoris: Secondary | ICD-10-CM | POA: Diagnosis not present

## 2023-08-13 DIAGNOSIS — E7800 Pure hypercholesterolemia, unspecified: Secondary | ICD-10-CM | POA: Diagnosis not present

## 2023-08-14 DIAGNOSIS — M7581 Other shoulder lesions, right shoulder: Secondary | ICD-10-CM | POA: Diagnosis not present

## 2023-08-14 DIAGNOSIS — M25611 Stiffness of right shoulder, not elsewhere classified: Secondary | ICD-10-CM | POA: Diagnosis not present

## 2023-08-14 DIAGNOSIS — M25511 Pain in right shoulder: Secondary | ICD-10-CM | POA: Diagnosis not present

## 2023-08-14 DIAGNOSIS — M6281 Muscle weakness (generalized): Secondary | ICD-10-CM | POA: Diagnosis not present

## 2023-08-14 DIAGNOSIS — M75121 Complete rotator cuff tear or rupture of right shoulder, not specified as traumatic: Secondary | ICD-10-CM | POA: Diagnosis not present

## 2023-08-14 DIAGNOSIS — G8929 Other chronic pain: Secondary | ICD-10-CM | POA: Diagnosis not present

## 2023-08-17 ENCOUNTER — Other Ambulatory Visit (HOSPITAL_COMMUNITY): Payer: Self-pay

## 2023-08-18 ENCOUNTER — Other Ambulatory Visit (HOSPITAL_BASED_OUTPATIENT_CLINIC_OR_DEPARTMENT_OTHER): Payer: Self-pay

## 2023-08-18 ENCOUNTER — Other Ambulatory Visit (HOSPITAL_COMMUNITY): Payer: Self-pay

## 2023-08-21 DIAGNOSIS — M25511 Pain in right shoulder: Secondary | ICD-10-CM | POA: Diagnosis not present

## 2023-08-21 DIAGNOSIS — G8929 Other chronic pain: Secondary | ICD-10-CM | POA: Diagnosis not present

## 2023-08-21 DIAGNOSIS — M6281 Muscle weakness (generalized): Secondary | ICD-10-CM | POA: Diagnosis not present

## 2023-08-21 DIAGNOSIS — M25611 Stiffness of right shoulder, not elsewhere classified: Secondary | ICD-10-CM | POA: Diagnosis not present

## 2023-08-21 DIAGNOSIS — M75121 Complete rotator cuff tear or rupture of right shoulder, not specified as traumatic: Secondary | ICD-10-CM | POA: Diagnosis not present

## 2023-08-21 DIAGNOSIS — M7581 Other shoulder lesions, right shoulder: Secondary | ICD-10-CM | POA: Diagnosis not present

## 2023-08-22 DIAGNOSIS — I480 Paroxysmal atrial fibrillation: Secondary | ICD-10-CM | POA: Diagnosis not present

## 2023-08-22 DIAGNOSIS — I10 Essential (primary) hypertension: Secondary | ICD-10-CM | POA: Diagnosis not present

## 2023-08-22 DIAGNOSIS — R21 Rash and other nonspecific skin eruption: Secondary | ICD-10-CM | POA: Diagnosis not present

## 2023-08-23 DIAGNOSIS — M7581 Other shoulder lesions, right shoulder: Secondary | ICD-10-CM | POA: Diagnosis not present

## 2023-08-23 DIAGNOSIS — M25511 Pain in right shoulder: Secondary | ICD-10-CM | POA: Diagnosis not present

## 2023-08-23 DIAGNOSIS — M25611 Stiffness of right shoulder, not elsewhere classified: Secondary | ICD-10-CM | POA: Diagnosis not present

## 2023-08-23 DIAGNOSIS — G8929 Other chronic pain: Secondary | ICD-10-CM | POA: Diagnosis not present

## 2023-08-23 DIAGNOSIS — M6281 Muscle weakness (generalized): Secondary | ICD-10-CM | POA: Diagnosis not present

## 2023-08-23 DIAGNOSIS — M75121 Complete rotator cuff tear or rupture of right shoulder, not specified as traumatic: Secondary | ICD-10-CM | POA: Diagnosis not present

## 2023-08-27 DIAGNOSIS — H2513 Age-related nuclear cataract, bilateral: Secondary | ICD-10-CM | POA: Diagnosis not present

## 2023-08-27 DIAGNOSIS — H40053 Ocular hypertension, bilateral: Secondary | ICD-10-CM | POA: Diagnosis not present

## 2023-08-27 DIAGNOSIS — H353131 Nonexudative age-related macular degeneration, bilateral, early dry stage: Secondary | ICD-10-CM | POA: Diagnosis not present

## 2023-08-28 DIAGNOSIS — M25511 Pain in right shoulder: Secondary | ICD-10-CM | POA: Diagnosis not present

## 2023-08-28 DIAGNOSIS — M25611 Stiffness of right shoulder, not elsewhere classified: Secondary | ICD-10-CM | POA: Diagnosis not present

## 2023-08-28 DIAGNOSIS — M6281 Muscle weakness (generalized): Secondary | ICD-10-CM | POA: Diagnosis not present

## 2023-08-28 DIAGNOSIS — M75121 Complete rotator cuff tear or rupture of right shoulder, not specified as traumatic: Secondary | ICD-10-CM | POA: Diagnosis not present

## 2023-08-28 DIAGNOSIS — G8929 Other chronic pain: Secondary | ICD-10-CM | POA: Diagnosis not present

## 2023-08-28 DIAGNOSIS — M7581 Other shoulder lesions, right shoulder: Secondary | ICD-10-CM | POA: Diagnosis not present

## 2023-08-29 DIAGNOSIS — G4719 Other hypersomnia: Secondary | ICD-10-CM | POA: Diagnosis not present

## 2023-08-29 DIAGNOSIS — R238 Other skin changes: Secondary | ICD-10-CM | POA: Diagnosis not present

## 2023-08-29 DIAGNOSIS — K219 Gastro-esophageal reflux disease without esophagitis: Secondary | ICD-10-CM | POA: Diagnosis not present

## 2023-08-30 DIAGNOSIS — M6281 Muscle weakness (generalized): Secondary | ICD-10-CM | POA: Diagnosis not present

## 2023-08-30 DIAGNOSIS — M7581 Other shoulder lesions, right shoulder: Secondary | ICD-10-CM | POA: Diagnosis not present

## 2023-08-30 DIAGNOSIS — L820 Inflamed seborrheic keratosis: Secondary | ICD-10-CM | POA: Diagnosis not present

## 2023-08-30 DIAGNOSIS — Z85828 Personal history of other malignant neoplasm of skin: Secondary | ICD-10-CM | POA: Diagnosis not present

## 2023-08-30 DIAGNOSIS — D485 Neoplasm of uncertain behavior of skin: Secondary | ICD-10-CM | POA: Diagnosis not present

## 2023-08-30 DIAGNOSIS — M25611 Stiffness of right shoulder, not elsewhere classified: Secondary | ICD-10-CM | POA: Diagnosis not present

## 2023-08-30 DIAGNOSIS — G8929 Other chronic pain: Secondary | ICD-10-CM | POA: Diagnosis not present

## 2023-08-30 DIAGNOSIS — L538 Other specified erythematous conditions: Secondary | ICD-10-CM | POA: Diagnosis not present

## 2023-08-30 DIAGNOSIS — M25511 Pain in right shoulder: Secondary | ICD-10-CM | POA: Diagnosis not present

## 2023-08-30 DIAGNOSIS — D2272 Melanocytic nevi of left lower limb, including hip: Secondary | ICD-10-CM | POA: Diagnosis not present

## 2023-08-30 DIAGNOSIS — L570 Actinic keratosis: Secondary | ICD-10-CM | POA: Diagnosis not present

## 2023-08-30 DIAGNOSIS — D2261 Melanocytic nevi of right upper limb, including shoulder: Secondary | ICD-10-CM | POA: Diagnosis not present

## 2023-08-30 DIAGNOSIS — M75121 Complete rotator cuff tear or rupture of right shoulder, not specified as traumatic: Secondary | ICD-10-CM | POA: Diagnosis not present

## 2023-08-30 DIAGNOSIS — D2262 Melanocytic nevi of left upper limb, including shoulder: Secondary | ICD-10-CM | POA: Diagnosis not present

## 2023-08-30 DIAGNOSIS — D225 Melanocytic nevi of trunk: Secondary | ICD-10-CM | POA: Diagnosis not present

## 2023-08-30 DIAGNOSIS — L988 Other specified disorders of the skin and subcutaneous tissue: Secondary | ICD-10-CM | POA: Diagnosis not present

## 2023-09-04 DIAGNOSIS — M25611 Stiffness of right shoulder, not elsewhere classified: Secondary | ICD-10-CM | POA: Diagnosis not present

## 2023-09-04 DIAGNOSIS — M75121 Complete rotator cuff tear or rupture of right shoulder, not specified as traumatic: Secondary | ICD-10-CM | POA: Diagnosis not present

## 2023-09-04 DIAGNOSIS — M25511 Pain in right shoulder: Secondary | ICD-10-CM | POA: Diagnosis not present

## 2023-09-04 DIAGNOSIS — M7581 Other shoulder lesions, right shoulder: Secondary | ICD-10-CM | POA: Diagnosis not present

## 2023-09-04 DIAGNOSIS — G8929 Other chronic pain: Secondary | ICD-10-CM | POA: Diagnosis not present

## 2023-09-04 DIAGNOSIS — M6281 Muscle weakness (generalized): Secondary | ICD-10-CM | POA: Diagnosis not present

## 2023-09-06 DIAGNOSIS — M25611 Stiffness of right shoulder, not elsewhere classified: Secondary | ICD-10-CM | POA: Diagnosis not present

## 2023-09-06 DIAGNOSIS — M25511 Pain in right shoulder: Secondary | ICD-10-CM | POA: Diagnosis not present

## 2023-09-06 DIAGNOSIS — M75121 Complete rotator cuff tear or rupture of right shoulder, not specified as traumatic: Secondary | ICD-10-CM | POA: Diagnosis not present

## 2023-09-06 DIAGNOSIS — M6281 Muscle weakness (generalized): Secondary | ICD-10-CM | POA: Diagnosis not present

## 2023-09-06 DIAGNOSIS — G8929 Other chronic pain: Secondary | ICD-10-CM | POA: Diagnosis not present

## 2023-09-06 DIAGNOSIS — M7581 Other shoulder lesions, right shoulder: Secondary | ICD-10-CM | POA: Diagnosis not present

## 2023-09-07 ENCOUNTER — Other Ambulatory Visit: Payer: Self-pay

## 2023-09-07 ENCOUNTER — Other Ambulatory Visit (HOSPITAL_COMMUNITY): Payer: Self-pay

## 2023-09-09 NOTE — Progress Notes (Unsigned)
 MRN : [id number]  Warren Ingram is a 83 y.o. (08-04-1940) male who presents with chief complaint of check carotid arteries.  History of Present Illness:   The patient is seen for follow up evaluation of carotid stenosis status post left carotid stent on 11/08/2021.  There were no post operative problems or complications related to the surgery.  The patient denies neck or incisional pain.   The patient denies interval amaurosis fugax. There is no recent history of TIA symptoms or focal motor deficits. There is no prior documented CVA.   The patient denies headache.   The patient is taking enteric-coated aspirin 81 mg daily.   No recent shortening of the patient's walking distance or new symptoms consistent with claudication.  No history of rest pain symptoms. No new ulcers or wounds of the lower extremities have occurred.   There is no history of DVT, PE or superficial thrombophlebitis. No recent episodes of angina or shortness of breath documented.    Today noninvasive studies show 1 to 39% stenosis of the left ICA.  The stent is patent.  40 to 59% stenosis of the right ICA.  The bilateral vertebral arteries have antegrade flow with normal flow hemodynamics in the bilateral subclavian arteries.  No outpatient medications have been marked as taking for the 09/10/23 encounter (Appointment) with Gilda Crease, Latina Craver, MD.    Past Medical History:  Diagnosis Date   Aortic stenosis 07/26/2019   a.) TTE Bubble study 07/26/2019: mild AS (MPG 13.1); b.) TTE 05/16/2022: mod AS (MPG 12.5; AVA 0.89); c.) TTE 01/09/2023: mild AS (MPG 18; AVA 0.90)   Arthritis    Atrial fibrillation and flutter (HCC)    a.) CHA2DS2-VASc = 5 (age x2, HTN, CVA x2) as of 06/14/2023; b.) cardiac rate/rhythm maintained on oral metoprolol tartrate; chronically anticoagulated using apixaban   Barrett esophagus    Benign neoplasm of abdomen    Bilateral carotid artery disease (HCC)    a.)  doppler 07/21/2013: <50 % BICA; b.) 10/24/2021 --> radiographic string sign LICA --> PTA with stenting (10 x 8 x 40 Exact stent; c.) doppler 12/16/2021, 03/13/2022, 09/11/2022: 40-59% RICA, 1-39% LICA   Coronary artery disease 03/30/2009   a.) LHC/PCI 03/30/2009: 75% mLCX (PTCA), 95% OM1 (2.75 x 15 mm Xience V DES); b.) LHC 07/25/2017: 100% pRCA, 95% p-mLAD, 10% oOM2, 90% lat OM2, 90% m-dLCx --> CVTS consult; c.) s/p 4v CABG   Diastolic dysfunction 07/26/2019   a.) bubble study 07/26/2019: EF 55-60%, no RWMAs, G1DD, triv TR; b.) TTE 07/23/2016: EF >55%, LVH, mild LAE, AoV sclerosis, mildly elevated PASP, G1DD; c.) TTE 05/16/2022: EF >55%, mod LVH, mod BAE, mod RVE, mod AS), triv MR, mod TR, mild PR; d.) TTE 01/09/2023: EF >55%, LVH, mild RVE, sev RAE, triv AR, mild AS, mild MR, sev TR (RVSP 39), triv PR   Diverticulitis    GERD (gastroesophageal reflux disease)    Headache    Heart murmur    History of hiatal hernia    Hypercholesterolemia    Hypertension    Hypothyroidism    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   a.) MRI brain 07/25/2019: acute lateral pontine infarct measuring 10 mm   Long term current use of clopidogrel    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   a.) MRI brain 07/25/2019: remote lacunar infact of the RIGHT lentiform nucleus and  BILATERAL thalami   Nontraumatic complete tear of rotator cuff, right    On apixaban therapy    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   a.) LIMA-LAD, SVG-PDA, SVG-D1, SVG-OM1; b.) POD1 patient with STE in V2-V3 --> Tx'd with colchicine for pericarditis   Skin cancer    T2DM (type 2 diabetes mellitus) (HCC)    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   a.) MRI brain 04/05/2022: small acute LEFT thalamic infarct   TIA (transient ischemic attack) 2015   Tubular adenoma     Past Surgical History:  Procedure Laterality Date   CAROTID PTA/STENT INTERVENTION Left 11/08/2021   Procedure: CAROTID PTA/STENT INTERVENTION;  Surgeon: Renford Dills, MD;  Location:  [facility] INVASIVE CV [facility];  Service: Cardiovascular;  Laterality: Left;   COLONOSCOPY WITH PROPOFOL N/A 01/01/2018   Procedure: COLONOSCOPY WITH PROPOFOL;  Surgeon: Wyline Mood, MD;  Location: [facility] ENDOSCOPY;  Service: Gastroenterology;  Laterality: N/A;   CORONARY ANGIOPLASTY WITH STENT PLACEMENT Left 03/30/2009   Procedure: CORONARY ANGIOPLASTY WITH STENT PLACEMENT; Location: [facility]; Surgeons: Harold Hedge, MD (diagnostic) and Marcina Millard, MD (interventional)   CORONARY ARTERY BYPASS GRAFT  08/06/2017   4 vessels   ESOPHAGOGASTRODUODENOSCOPY (EGD) WITH PROPOFOL N/A 10/28/2015   Procedure: ESOPHAGOGASTRODUODENOSCOPY (EGD) WITH PROPOFOL;  Surgeon: Wallace Cullens, MD;  Location: [facility] ENDOSCOPY;  Service: Gastroenterology;  Laterality: N/A;   KNEE ARTHROSCOPY     LEFT HEART CATH AND CORONARY ANGIOGRAPHY N/A 07/25/2017   Procedure: LEFT HEART CATH AND CORONARY ANGIOGRAPHY;  Surgeon: Dalia Heading, MD;  Location: [facility] INVASIVE CV LAB;  Service: Cardiovascular;  Laterality: N/A;   NASAL SINUS SURGERY     PARTIAL KNEE ARTHROPLASTY Right 06/27/2019   Procedure: UNICOMPARTMENTAL KNEE;  Surgeon: Christena Flake, MD;  Location: [facility] ORS;  Service: Orthopedics;  Laterality: Right;   SHOULDER ARTHROSCOPY WITH SUBACROMIAL DECOMPRESSION, ROTATOR CUFF REPAIR AND BICEP TENDON REPAIR Right 06/19/2023   Procedure: RIGHT SHOULDER ARTHROSCOPY WITH DEBRIDEMENT, DECOMPRESSION, ROTATOR CUFF REPAIR;  Surgeon: Christena Flake, MD;  Location: [facility] ORS;  Service: Orthopedics;  Laterality: Right;   TOTAL KNEE ARTHROPLASTY Left 12/19/2018   Procedure: TOTAL KNEE ARTHROPLASTY;  Surgeon: Christena Flake, MD;  Location: [facility] ORS;  Service: Orthopedics;  Laterality: Left;   VASECTOMY      Social History Social History   Tobacco Use   Smoking status: Former    Current packs/day: 0.00    Average packs/day: 1 pack/day for 15.0 years (15.0 ttl pk-yrs)    Types: Cigarettes    Start date: 06/05/1956    Quit date: 06/06/1971    Years  since quitting: 52.2   Smokeless tobacco: Former  Building services engineer status: Never Used  Substance Use Topics   Alcohol use: Yes    Alcohol/week: 2.0 standard drinks of alcohol    Types: 1 Cans of beer, 1 Shots of liquor per week    Comment: 4 to 5 times per week   Drug use: No    Family History Family History  Problem Relation Age of Onset   Heart attack Mother    Lung disease Father     No Known Allergies   REVIEW OF SYSTEMS (Negative unless checked)  Constitutional: [] Weight loss  [] Fever  [] Chills Cardiac: [] Chest pain   [] Chest pressure   [] Palpitations   [] Shortness of breath when laying flat   [] Shortness of breath with exertion. Vascular:  [x] Pain in legs with walking   [] Pain in legs at rest  [] History of DVT   [] Phlebitis   []   Swelling in legs   [] Varicose veins   [] Non-healing ulcers Pulmonary:   [] Uses home oxygen   [] Productive cough   [] Hemoptysis   [] Wheeze  [] COPD   [] Asthma Neurologic:  [] Dizziness   [] Seizures   [] History of stroke   [] History of TIA  [] Aphasia   [] Vissual changes   [] Weakness or numbness in arm   [] Weakness or numbness in leg Musculoskeletal:   [] Joint swelling   [x] Joint pain   [] Low back pain Hematologic:  [] Easy bruising  [] Easy bleeding   [] Hypercoagulable state   [] Anemic Gastrointestinal:  [] Diarrhea   [] Vomiting  [x] Gastroesophageal reflux/heartburn   [] Difficulty swallowing. Genitourinary:  [] Chronic kidney disease   [] Difficult urination  [] Frequent urination   [] Blood in urine Skin:  [] Rashes   [] Ulcers  Psychological:  [] History of anxiety   []  History of major depression.  Physical Examination  There were no vitals filed for this visit. There is no height or weight on file to calculate BMI. Gen: WD/WN, NAD Head: [place]/AT, No temporalis wasting.  Ear/Nose/Throat: Hearing grossly intact, nares w/o erythema or drainage Eyes: PER, EOMI, sclera nonicteric.  Neck: Supple, no masses.  No bruit or JVD.  Pulmonary:  Good air movement,  no audible wheezing, no use of accessory muscles.  Cardiac: RRR, normal S1, S2, no Murmurs. Vascular:  carotid bruit noted Vessel Right Left  Radial Palpable Palpable  Carotid  Palpable  Palpable  Subclav  Palpable Palpable  Gastrointestinal: soft, non-distended. No guarding/no peritoneal signs.  Musculoskeletal: M/S 5/5 throughout.  No visible deformity.  Neurologic: CN 2-12 intact. Pain and light touch intact in extremities.  Symmetrical.  Speech is fluent. Motor exam as listed above. Psychiatric: Judgment intact, Mood & affect appropriate for pt's clinical situation. Dermatologic: No rashes or ulcers noted.  No changes consistent with cellulitis.   CBC Lab Results  Component Value Date   WBC 7.1 04/05/2022   HGB 16.1 04/05/2022   HCT 47.5 04/05/2022   MCV 93.1 04/05/2022   PLT 210 04/05/2022    BMET    Component Value Date/Time   NA 139 04/05/2022 0925   K 4.3 04/05/2022 0925   CL 105 04/05/2022 0925   CO2 29 04/05/2022 0925   GLUCOSE 135 (H) 04/05/2022 0925   BUN 21 04/05/2022 0925   CREATININE 1.22 04/05/2022 0925   CALCIUM 9.2 04/05/2022 0925   GFRNONAA 60 (L) 04/05/2022 0925   GFRAA >60 07/25/2019 1117   CrCl cannot be calculated (Patient's most recent lab result is older than the maximum 21 days allowed.).  COAG Lab Results  Component Value Date   INR 1.0 07/25/2019   INR 0.97 12/30/2017    Radiology No results found.   Assessment/Plan There are no diagnoses linked to this encounter.   Levora Dredge, MD  09/09/2023 3:25 PM

## 2023-09-10 ENCOUNTER — Encounter (INDEPENDENT_AMBULATORY_CARE_PROVIDER_SITE_OTHER): Payer: Self-pay | Admitting: Vascular Surgery

## 2023-09-10 ENCOUNTER — Ambulatory Visit (INDEPENDENT_AMBULATORY_CARE_PROVIDER_SITE_OTHER): Payer: Medicare HMO | Admitting: Vascular Surgery

## 2023-09-10 ENCOUNTER — Ambulatory Visit (INDEPENDENT_AMBULATORY_CARE_PROVIDER_SITE_OTHER): Payer: Medicare HMO

## 2023-09-10 VITALS — BP 134/83 | HR 79 | Resp 18 | Ht 64.0 in | Wt 179.8 lb

## 2023-09-10 DIAGNOSIS — E7800 Pure hypercholesterolemia, unspecified: Secondary | ICD-10-CM | POA: Diagnosis not present

## 2023-09-10 DIAGNOSIS — I2510 Atherosclerotic heart disease of native coronary artery without angina pectoris: Secondary | ICD-10-CM

## 2023-09-10 DIAGNOSIS — K219 Gastro-esophageal reflux disease without esophagitis: Secondary | ICD-10-CM | POA: Diagnosis not present

## 2023-09-10 DIAGNOSIS — I6523 Occlusion and stenosis of bilateral carotid arteries: Secondary | ICD-10-CM

## 2023-09-10 DIAGNOSIS — I6522 Occlusion and stenosis of left carotid artery: Secondary | ICD-10-CM

## 2023-09-10 DIAGNOSIS — I10 Essential (primary) hypertension: Secondary | ICD-10-CM

## 2023-09-11 DIAGNOSIS — M25511 Pain in right shoulder: Secondary | ICD-10-CM | POA: Diagnosis not present

## 2023-09-11 DIAGNOSIS — M7581 Other shoulder lesions, right shoulder: Secondary | ICD-10-CM | POA: Diagnosis not present

## 2023-09-11 DIAGNOSIS — M75121 Complete rotator cuff tear or rupture of right shoulder, not specified as traumatic: Secondary | ICD-10-CM | POA: Diagnosis not present

## 2023-09-11 DIAGNOSIS — M25611 Stiffness of right shoulder, not elsewhere classified: Secondary | ICD-10-CM | POA: Diagnosis not present

## 2023-09-11 DIAGNOSIS — M6281 Muscle weakness (generalized): Secondary | ICD-10-CM | POA: Diagnosis not present

## 2023-09-11 DIAGNOSIS — G8929 Other chronic pain: Secondary | ICD-10-CM | POA: Diagnosis not present

## 2023-09-12 DIAGNOSIS — M25562 Pain in left knee: Secondary | ICD-10-CM | POA: Diagnosis not present

## 2023-09-12 DIAGNOSIS — S8002XA Contusion of left knee, initial encounter: Secondary | ICD-10-CM | POA: Diagnosis not present

## 2023-09-12 DIAGNOSIS — Z96652 Presence of left artificial knee joint: Secondary | ICD-10-CM | POA: Diagnosis not present

## 2023-09-13 DIAGNOSIS — G8929 Other chronic pain: Secondary | ICD-10-CM | POA: Diagnosis not present

## 2023-09-13 DIAGNOSIS — M25511 Pain in right shoulder: Secondary | ICD-10-CM | POA: Diagnosis not present

## 2023-09-13 DIAGNOSIS — M25611 Stiffness of right shoulder, not elsewhere classified: Secondary | ICD-10-CM | POA: Diagnosis not present

## 2023-09-13 DIAGNOSIS — M7581 Other shoulder lesions, right shoulder: Secondary | ICD-10-CM | POA: Diagnosis not present

## 2023-09-13 DIAGNOSIS — M6281 Muscle weakness (generalized): Secondary | ICD-10-CM | POA: Diagnosis not present

## 2023-09-13 DIAGNOSIS — M75121 Complete rotator cuff tear or rupture of right shoulder, not specified as traumatic: Secondary | ICD-10-CM | POA: Diagnosis not present

## 2023-09-25 DIAGNOSIS — M75121 Complete rotator cuff tear or rupture of right shoulder, not specified as traumatic: Secondary | ICD-10-CM | POA: Diagnosis not present

## 2023-09-25 DIAGNOSIS — M6281 Muscle weakness (generalized): Secondary | ICD-10-CM | POA: Diagnosis not present

## 2023-09-25 DIAGNOSIS — M25511 Pain in right shoulder: Secondary | ICD-10-CM | POA: Diagnosis not present

## 2023-09-25 DIAGNOSIS — M7581 Other shoulder lesions, right shoulder: Secondary | ICD-10-CM | POA: Diagnosis not present

## 2023-09-25 DIAGNOSIS — G8929 Other chronic pain: Secondary | ICD-10-CM | POA: Diagnosis not present

## 2023-09-25 DIAGNOSIS — M25611 Stiffness of right shoulder, not elsewhere classified: Secondary | ICD-10-CM | POA: Diagnosis not present

## 2023-09-26 DIAGNOSIS — E7800 Pure hypercholesterolemia, unspecified: Secondary | ICD-10-CM | POA: Diagnosis not present

## 2023-09-26 DIAGNOSIS — R7303 Prediabetes: Secondary | ICD-10-CM | POA: Diagnosis not present

## 2023-09-26 DIAGNOSIS — I10 Essential (primary) hypertension: Secondary | ICD-10-CM | POA: Diagnosis not present

## 2023-09-26 DIAGNOSIS — Z125 Encounter for screening for malignant neoplasm of prostate: Secondary | ICD-10-CM | POA: Diagnosis not present

## 2023-09-26 DIAGNOSIS — Z8673 Personal history of transient ischemic attack (TIA), and cerebral infarction without residual deficits: Secondary | ICD-10-CM | POA: Diagnosis not present

## 2023-09-26 DIAGNOSIS — I2510 Atherosclerotic heart disease of native coronary artery without angina pectoris: Secondary | ICD-10-CM | POA: Diagnosis not present

## 2023-09-26 DIAGNOSIS — Z0000 Encounter for general adult medical examination without abnormal findings: Secondary | ICD-10-CM | POA: Diagnosis not present

## 2023-09-26 DIAGNOSIS — R413 Other amnesia: Secondary | ICD-10-CM | POA: Diagnosis not present

## 2023-09-26 DIAGNOSIS — E039 Hypothyroidism, unspecified: Secondary | ICD-10-CM | POA: Diagnosis not present

## 2023-09-26 DIAGNOSIS — E669 Obesity, unspecified: Secondary | ICD-10-CM | POA: Diagnosis not present

## 2023-09-26 DIAGNOSIS — Z79899 Other long term (current) drug therapy: Secondary | ICD-10-CM | POA: Diagnosis not present

## 2023-09-26 DIAGNOSIS — I480 Paroxysmal atrial fibrillation: Secondary | ICD-10-CM | POA: Diagnosis not present

## 2023-10-01 ENCOUNTER — Ambulatory Visit: Attending: Cardiology | Admitting: Cardiology

## 2023-10-01 ENCOUNTER — Encounter: Payer: Self-pay | Admitting: Cardiology

## 2023-10-01 VITALS — BP 118/64 | HR 92 | Ht 66.0 in | Wt 181.0 lb

## 2023-10-01 DIAGNOSIS — I10 Essential (primary) hypertension: Secondary | ICD-10-CM | POA: Diagnosis not present

## 2023-10-01 DIAGNOSIS — I4819 Other persistent atrial fibrillation: Secondary | ICD-10-CM | POA: Diagnosis not present

## 2023-10-01 DIAGNOSIS — Z951 Presence of aortocoronary bypass graft: Secondary | ICD-10-CM | POA: Diagnosis not present

## 2023-10-01 NOTE — Progress Notes (Signed)
 Cardiology Office Note:    Date:  10/01/2023   ID:  Warren Ingram, DOB 07-25-1940, MRN [id number]  PCP:  Yehuda Helms, MD   [facility] Providers Cardiologist:  None     Referring MD: Yehuda Helms, MD   Chief Complaint  Patient presents with   Establish Care    Patient is doing well on today. Patient state that he has been experiencing some shortness of breath and some swelling in his legs. Meds reviewed.     History of Present Illness:    Warren Ingram is a 83 y.o. male with a hx of CAD s/p CABG x 4 in 2019 (LIMA-LAD, SVG-PDA, SVG-D1, SVG-OM1, atrial fibrillation, carotid stenosis s/p left carotid artery stenting 11/2021, TIA who presents to establish care.  Previously seen by [facility] cardiology from a cardiac perspective.  Would like to switch care to [facility] health heart care.  Endorses daytime somnolence, followed up with PCP, sleep study has been scheduled.  Denies palpitations.  Compliant with Eliquis , Plavix  as prescribed.  Denies any bleeding issues.  Did not tolerate Lasix in the past, started on Bumex with good effect.  Endorses adequate diuresing.  Outside echo 8/24 EF>55%, mild aortic valve stenosis  Past Medical History:  Diagnosis Date   Aortic stenosis 07/26/2019   a.) TTE Bubble study 07/26/2019: mild AS (MPG 13.1); b.) TTE 05/16/2022: mod AS (MPG 12.5; AVA 0.89); c.) TTE 01/09/2023: mild AS (MPG 18; AVA 0.90)   Arthritis    Atrial fibrillation and flutter (HCC)    a.) CHA2DS2-VASc = 5 (age x2, HTN, CVA x2) as of 06/14/2023; b.) cardiac rate/rhythm maintained on oral metoprolol  tartrate; chronically anticoagulated using apixaban    Barrett esophagus    Benign neoplasm of abdomen    Bilateral carotid artery disease (HCC)    a.) doppler 07/21/2013: <50 % BICA; b.) 10/24/2021 --> radiographic string sign LICA --> PTA with stenting (10 x 8 x 40 Exact stent; c.) doppler 12/16/2021, 03/13/2022, 09/11/2022: 40-59% RICA, 1-39% LICA   Coronary  artery disease 03/30/2009   a.) LHC/PCI 03/30/2009: 75% mLCX (PTCA), 95% OM1 (2.75 x 15 mm Xience V DES); b.) LHC 07/25/2017: 100% pRCA, 95% p-mLAD, 10% oOM2, 90% lat OM2, 90% m-dLCx --> CVTS consult; c.) s/p 4v CABG   Diastolic dysfunction 07/26/2019   a.) bubble study 07/26/2019: EF 55-60%, no RWMAs, G1DD, triv TR; b.) TTE 07/23/2016: EF >55%, LVH, mild LAE, AoV sclerosis, mildly elevated PASP, G1DD; c.) TTE 05/16/2022: EF >55%, mod LVH, mod BAE, mod RVE, mod AS), triv MR, mod TR, mild PR; d.) TTE 01/09/2023: EF >55%, LVH, mild RVE, sev RAE, triv AR, mild AS, mild MR, sev TR (RVSP 39), triv PR   Diverticulitis    GERD (gastroesophageal reflux disease)    Headache    Heart murmur    History of hiatal hernia    Hypercholesterolemia    Hypertension    Hypothyroidism    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   a.) MRI brain 07/25/2019: acute lateral pontine infarct measuring 10 mm   Long term current use of clopidogrel     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   a.) MRI brain 07/25/2019: remote lacunar infact of the RIGHT lentiform nucleus and BILATERAL thalami   Nontraumatic complete tear of rotator cuff, right    On apixaban  therapy    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   a.) LIMA-LAD, SVG-PDA, SVG-D1, SVG-OM1; b.) POD1 patient with STE in V2-V3 --> Tx'd with colchicine for pericarditis  Skin cancer    T2DM (type 2 diabetes mellitus) (HCC)    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   a.) MRI brain 04/05/2022: small acute LEFT thalamic infarct   TIA (transient ischemic attack) 2015   Tubular adenoma     Past Surgical History:  Procedure Laterality Date   CAROTID PTA/STENT INTERVENTION Left 11/08/2021   Procedure: CAROTID PTA/STENT INTERVENTION;  Surgeon: Jackquelyn Mass, MD;  Location: [facility] INVASIVE CV LAB;  Service: Cardiovascular;  Laterality: Left;   COLONOSCOPY WITH PROPOFOL  N/A 01/01/2018   Procedure: COLONOSCOPY WITH PROPOFOL ;  Surgeon: Luke Salaam, MD;  Location: [facility] ENDOSCOPY;   Service: Gastroenterology;  Laterality: N/A;   CORONARY ANGIOPLASTY WITH STENT PLACEMENT Left 03/30/2009   Procedure: CORONARY ANGIOPLASTY WITH STENT PLACEMENT; Location: [facility]; Surgeons: Starlette Ebbs, MD (diagnostic) and Percival Brace, MD (interventional)   CORONARY ARTERY BYPASS GRAFT  08/06/2017   4 vessels   ESOPHAGOGASTRODUODENOSCOPY (EGD) WITH PROPOFOL  N/A 10/28/2015   Procedure: ESOPHAGOGASTRODUODENOSCOPY (EGD) WITH PROPOFOL ;  Surgeon: Stephens Eis, MD;  Location: [facility] ENDOSCOPY;  Service: Gastroenterology;  Laterality: N/A;   KNEE ARTHROSCOPY     LEFT HEART CATH AND CORONARY ANGIOGRAPHY N/A 07/25/2017   Procedure: LEFT HEART CATH AND CORONARY ANGIOGRAPHY;  Surgeon: Ronney Cola, MD;  Location: [facility] INVASIVE CV LAB;  Service: Cardiovascular;  Laterality: N/A;   NASAL SINUS SURGERY     PARTIAL KNEE ARTHROPLASTY Right 06/27/2019   Procedure: UNICOMPARTMENTAL KNEE;  Surgeon: Elner Hahn, MD;  Location: [facility] ORS;  Service: Orthopedics;  Laterality: Right;   SHOULDER ARTHROSCOPY WITH SUBACROMIAL DECOMPRESSION, ROTATOR CUFF REPAIR AND BICEP TENDON REPAIR Right 06/19/2023   Procedure: RIGHT SHOULDER ARTHROSCOPY WITH DEBRIDEMENT, DECOMPRESSION, ROTATOR CUFF REPAIR;  Surgeon: Elner Hahn, MD;  Location: [facility] ORS;  Service: Orthopedics;  Laterality: Right;   TOTAL KNEE ARTHROPLASTY Left 12/19/2018   Procedure: TOTAL KNEE ARTHROPLASTY;  Surgeon: Elner Hahn, MD;  Location: [facility] ORS;  Service: Orthopedics;  Laterality: Left;   VASECTOMY      Current Medications: Current Meds  Medication Sig   acetaminophen  (TYLENOL ) 500 MG tablet Take 1,000 mg by mouth every 6 (six) hours as needed for moderate pain (pain score 4-6).   apixaban  (ELIQUIS ) 5 MG TABS tablet Take 1 tablet (5 mg total) by mouth every 12 (twelve) hours.   atorvastatin  (LIPITOR ) 80 MG tablet Take 1 tablet (80 mg total) by mouth daily.   bumetanide (BUMEX) 2 MG tablet Take by mouth.   clopidogrel  (PLAVIX ) 75 MG tablet Take 1  tablet (75 mg total) by mouth daily.   fenofibrate  (TRICOR ) 145 MG tablet Take 1 tablet (145 mg total) by mouth daily.   levothyroxine  (SYNTHROID ) 150 MCG tablet Take 1 tablet (150 mcg total) by mouth daily on an empty stomach with a glass of water  at least 30-60 minutes before breakfast.   lisinopril  (ZESTRIL ) 10 MG tablet Take 1 tablet (10 mg total) by mouth every evening. (Patient taking differently: Take 20 mg by mouth every evening.)   metoprolol  succinate (TOPROL -XL) 100 MG 24 hr tablet Take by mouth.   Omega-3 Fatty Acids (FISH OIL) 1000 MG CAPS Take 1,000 mg by mouth 2 (two) times daily.   omeprazole  (PRILOSEC) 20 MG capsule Take 1 capsule (20 mg total) by mouth daily.   tamsulosin  (FLOMAX ) 0.4 MG CAPS capsule Take 1 capsule (0.4 mg total) by mouth daily.   triamcinolone  cream (KENALOG ) 0.5 % Apply to affected area twice daily as directed.   [DISCONTINUED] amLODipine  (NORVASC ) 5 MG tablet Take 1  tablet (5 mg total) by mouth at night.   [DISCONTINUED] metoprolol  tartrate (LOPRESSOR ) 50 MG tablet Take 1 tablet (50 mg total) by mouth 2 (two) times daily.     Allergies:   Patient has no known allergies.   Social History   Socioeconomic History   Marital status: Married    Spouse name: Not on file   Number of children: Not on file   Years of education: Not on file   Highest education level: Not on file  Occupational History   Not on file  Tobacco Use   Smoking status: Former    Current packs/day: 0.00    Average packs/day: 1 pack/day for 15.0 years (15.0 ttl pk-yrs)    Types: Cigarettes    Start date: 06/05/1956    Quit date: 06/06/1971    Years since quitting: 52.3   Smokeless tobacco: Former  Building services engineer status: Never Used  Substance and Sexual Activity   Alcohol use: Yes    Alcohol/week: 2.0 standard drinks of alcohol    Types: 1 Cans of beer, 1 Shots of liquor per week    Comment: 4 to 5 times per week   Drug use: No   Sexual activity: Not on file  Other Topics  Concern   Not on file  Social History Narrative   Not on file   Social Drivers of Health   Financial Resource Strain: Low Risk  (09/12/2023)   Received from [facility]   Overall Financial Resource Strain (CARDIA)    Difficulty of Paying Living Expenses: Not hard at all  Food Insecurity: No Food Insecurity (09/12/2023)   Received from [facility]   Hunger Vital Sign    Worried About Running Out of Food in the Last Year: Never true    Ran Out of Food in the Last Year: Never true  Transportation Needs: No Transportation Needs (09/12/2023)   Received from [facility] - Transportation    In the past 12 months, has lack of transportation kept you from medical appointments or from getting medications?: No    Lack of Transportation (Non-Medical): No  Physical Activity: Not on file  Stress: Not on file  Social Connections: Not on file     Family History: The patient's family history includes Heart attack in his mother; Lung disease in his father.  ROS:   Please see the history of present illness.     All other systems reviewed and are negative.  EKGs/Labs/Other Studies Reviewed:    The following studies were reviewed today:  EKG Interpretation Date/Time:  Monday October 01 2023 13:55:21 EDT Ventricular Rate:  92 PR Interval:    QRS Duration:  142 QT Interval:  428 QTC Calculation: 529 R Axis:   53  Text Interpretation: Atrial fibrillation Right bundle branch block T wave abnormality, consider inferolateral ischemia Confirmed by Constancia Delton ([id number]) on 10/01/2023 2:00:18 PM    Recent Labs: No results found for requested labs within last 365 days.  Recent Lipid Panel    Component Value Date/Time   CHOL 207 (H) 07/25/2019 1117   TRIG 333 (H) 07/25/2019 1117   HDL 32 (L) 07/25/2019 1117   CHOLHDL 6.5 07/25/2019 1117   VLDL 67 (H) 07/25/2019 1117   LDLCALC 108 (H) 07/25/2019 1117     Risk  Assessment/Calculations:             Physical Exam:    VS:  BP 118/64  Pulse 92   Ht 5\' 6"  (1.676 m)   Wt 181 lb (82.1 kg)   SpO2 98%   BMI 29.21 kg/m     Wt Readings from Last 3 Encounters:  10/01/23 181 lb (82.1 kg)  09/10/23 179 lb 12.8 oz (81.6 kg)  06/19/23 175 lb 0.7 oz (79.4 kg)     GEN:  Well nourished, well developed in no acute distress HEENT: Normal NECK: No JVD; No carotid bruits LYMPHATICS: No lymphadenopathy CARDIAC: Irregular irregular RESPIRATORY: Diminished breath sounds, no wheezing ABDOMEN: Soft, non-tender, non-distended MUSCULOSKELETAL:  trace edema; No deformity  SKIN: Warm and dry NEUROLOGIC:  Alert and oriented x 3 PSYCHIATRIC:  Normal affect   ASSESSMENT:    1. Persistent atrial fibrillation (HCC)   2. S/P CABG x 4   3. Primary hypertension    PLAN:    In order of problems listed above:  Persistent atrial fibrillation, heart rate controlled.  Obtain echo.  Plan DC cardioversion.  Has not missed Eliquis  dose over the past 4 weeks.  Refer to A-fib clinic.  Continue Toprol -XL 100 mg daily, Eliquis  5 mg twice daily.  Also on Plavix  due to TIA while on Eliquis  CAD s/p CABG x 4 in 2019.  Denies chest pain.  Continue Eliquis , Toprol -XL, Plavix . Hypertension, BP controlled.  Continue Toprol -XL 100 mg daily, lisinopril  20 mg daily.  Follow-up in 6 weeks      Medication Adjustments/Labs and Tests Ordered: Current medicines are reviewed at length with the patient today.  Concerns regarding medicines are outlined above.  Orders Placed This Encounter  Procedures   CBC   Basic Metabolic Panel (BMET)   Ambulatory referral to Cardiac Electrophysiology   EKG 12-Lead   ECHOCARDIOGRAM COMPLETE   No orders of the defined types were placed in this encounter.   Patient Instructions  Medication Instructions:  Your physician recommends the following medication changes.  STOP TAKING: Amiodarone Amlodipine  Metoprolol   *If you need a refill on  your cardiac medications before your next appointment, please call your pharmacy*  Lab Work: Your provider would like for you to return in 1 week to have the following labs drawn: CBC, BMP.  Please go to [facility] [address] ([address]) [address], [place] [address] You do not need an appointment.  They are open from 8 am- 4:30 pm.  Lunch from 1:00 pm- 2:00 pm You do not need to be fasting.   Testing/Procedures: Your physician has requested that you have an echocardiogram. Echocardiography is a painless test that uses sound waves to create images of your heart. It provides your doctor with information about the size and shape of your heart and how well your heart's chambers and valves are working.   You may receive an ultrasound enhancing agent through an IV if needed to better visualize your heart during the echo. This procedure takes approximately one hour.  There are no restrictions for this procedure.  This will take place at [address] [facility]) [address], [place] [address]  Please note: We ask at that you not bring children with you during ultrasound (echo/ vascular) testing. Due to room size and safety concerns, children are not allowed in the ultrasound rooms during exams. Our front office staff cannot provide observation of children in our lobby area while testing is being conducted. An adult accompanying a patient to their appointment will only be allowed in the ultrasound room at the discretion of the ultrasound technician under special circumstances. We apologize  for any inconvenience.    Follow-Up: At [facility], you and your health needs are our priority.  As part of our continuing mission to provide you with exceptional heart care, our providers are all part of one team.  This team includes your primary Cardiologist (physician) and Advanced Practice Providers or APPs (Physician Assistants and Nurse Practitioners) who all  work together to provide you with the care you need, when you need it.  Your next appointment:   2 month(s)  Provider:   You may see Dr. Junnie Olives or one of the following Advanced Practice Providers on your designated Care Team:   Laneta Pintos, NP Gildardo Labrador, PA-C Varney Gentleman, PA-C Cadence [place], PA-C Ronald Cockayne, NP Morey Ar, NP    We recommend signing up for the patient portal called "MyChart".  Sign up information is provided on this After Visit Summary.  MyChart is used to connect with patients for Virtual Visits (Telemedicine).  Patients are able to view lab/test results, encounter notes, upcoming appointments, etc.  Non-urgent messages can be sent to your provider as well.   To learn more about what you can do with MyChart, go to [email or web address].         Dear Asuncion Blanc  You are scheduled for a Cardioversion on Friday, May 8 with Dr. Junnie Olives.  Please arrive at the [facility] Entrance of [facility], [address], [place] [address] at 6:30 AM (This is 1 hour(s) prior to your procedure time).  Proceed to the Check-In Desk directly inside the entrance.  Procedure Parking: Use the entrance off of the [address] side of the hospital. Turn right upon entering and follow the driveway to parking that is directly in front of the [facility]ascular Center. There is no valet parking available at this entrance, however there is an awning directly in front of the Heart & Vascular Center for drop off/ pick up for patients.   DIET:  Nothing to eat or drink after midnight except a sip of water  with medications (see medication instructions below)  MEDICATION INSTRUCTIONS: Continue taking your anticoagulant (blood thinner): Apixaban  (Eliquis ) and Clopidogrel  (Plavix )  You will need to continue this after your procedure until you are told by your provider that it is safe to stop.    FYI:  For your safety, and to allow us  to monitor your  vital signs accurately during the surgery/procedure we request: If you have artificial nails, gel coating, SNS etc, please have those removed prior to your surgery/procedure. Not having the nail coverings /polish removed may result in cancellation or delay of your surgery/procedure.  Your support person will be asked to wait in the waiting room during your procedure.  It is OK to have someone drop you off and come back when you are ready to be discharged.  You cannot drive after the procedure and will need someone to drive you home.  Bring your insurance cards.  *Special Note: Every effort is made to have your procedure done on time. Occasionally there are emergencies that occur at the hospital that may cause delays. Please be patient if a delay does occur.             Signed, Constancia Delton, MD  10/01/2023 3:29 PM    [facility]

## 2023-10-01 NOTE — Patient Instructions (Signed)
 Medication Instructions:  Your physician recommends the following medication changes.  STOP TAKING: Amiodarone Amlodipine  Metoprolol   *If you need a refill on your cardiac medications before your next appointment, please call your pharmacy*  Lab Work: Your provider would like for you to return in 1 week to have the following labs drawn: CBC, BMP.  Please go to [facility] [address] ([facility] [address]) [address], [place] [address] You do not need an appointment.  They are open from 8 am- 4:30 pm.  Lunch from 1:00 pm- 2:00 pm You do not need to be fasting.   Testing/Procedures: Your physician has requested that you have an echocardiogram. Echocardiography is a painless test that uses sound waves to create images of your heart. It provides your doctor with information about the size and shape of your heart and how well your heart's chambers and valves are working.   You may receive an ultrasound enhancing agent through an IV if needed to better visualize your heart during the echo. This procedure takes approximately one hour.  There are no restrictions for this procedure.  This will take place at [address] [facility] [address]) [address], [place] [address]  Please note: We ask at that you not bring children with you during ultrasound (echo/ vascular) testing. Due to room size and safety concerns, children are not allowed in the ultrasound rooms during exams. Our front office staff cannot provide observation of children in our lobby area while testing is being conducted. An adult accompanying a patient to their appointment will only be allowed in the ultrasound room at the discretion of the ultrasound technician under special circumstances. We apologize for any inconvenience.    Follow-Up: At [facility], you and your health needs are our priority.  As part of our continuing mission to provide you with exceptional heart care, our providers are all part  of one team.  This team includes your primary Cardiologist (physician) and Advanced Practice Providers or APPs (Physician Assistants and Nurse Practitioners) who all work together to provide you with the care you need, when you need it.  Your next appointment:   2 month(s)  Provider:   You may see Dr. Junnie Olives or one of the following Advanced Practice Providers on your designated Care Team:   Laneta Pintos, NP Gildardo Labrador, PA-C Varney Gentleman, PA-C Cadence [place], PA-C Ronald Cockayne, NP Morey Ar, NP    We recommend signing up for the patient portal called "MyChart".  Sign up information is provided on this After Visit Summary.  MyChart is used to connect with patients for Virtual Visits (Telemedicine).  Patients are able to view lab/test results, encounter notes, upcoming appointments, etc.  Non-urgent messages can be sent to your provider as well.   To learn more about what you can do with MyChart, go to [email or web address].         Dear Warren Ingram  You are scheduled for a Cardioversion on Friday, May 8 with Dr. Junnie Olives.  Please arrive at the [facility] Entrance of [facility], [address] [place], [place] [address] at 6:30 AM (This is 1 hour(s) prior to your procedure time).  Proceed to the Check-In Desk directly inside the entrance.  Procedure Parking: Use the entrance off of the [address] side of the hospital. Turn right upon entering and follow the driveway to parking that is directly in front of the Heart & Vascular Center. There is no valet parking available at this entrance, however  there is an awning directly in front of the Heart [facility] Vascular Center for drop off/ pick up for patients.   DIET:  Nothing to eat or drink after midnight except a sip of water  with medications (see medication instructions below)  MEDICATION INSTRUCTIONS: Continue taking your anticoagulant (blood thinner): Apixaban  (Eliquis ) and Clopidogrel  (Plavix )  You will  need to continue this after your procedure until you are told by your provider that it is safe to stop.    FYI:  For your safety, and to allow us  to monitor your vital signs accurately during the surgery/procedure we request: If you have artificial nails, gel coating, SNS etc, please have those removed prior to your surgery/procedure. Not having the nail coverings /polish removed may result in cancellation or delay of your surgery/procedure.  Your support person will be asked to wait in the waiting room during your procedure.  It is OK to have someone drop you off and come back when you are ready to be discharged.  You cannot drive after the procedure and will need someone to drive you home.  Bring your insurance cards.  *Special Note: Every effort is made to have your procedure done on time. Occasionally there are emergencies that occur at the hospital that may cause delays. Please be patient if a delay does occur.

## 2023-10-01 NOTE — H&P (View-Only) (Signed)
 Cardiology Office Note:    Date:  10/01/2023   ID:  Warren Ingram, DOB 07-25-1940, MRN [id number]  PCP:  Yehuda Helms, MD   [facility] Providers Cardiologist:  None     Referring MD: Yehuda Helms, MD   Chief Complaint  Patient presents with   Establish Care    Patient is doing well on today. Patient state that he has been experiencing some shortness of breath and some swelling in his legs. Meds reviewed.     History of Present Illness:    Warren Ingram is a 83 y.o. male with a hx of CAD s/p CABG x 4 in 2019 (LIMA-LAD, SVG-PDA, SVG-D1, SVG-OM1, atrial fibrillation, carotid stenosis s/p left carotid artery stenting 11/2021, TIA who presents to establish care.  Previously seen by [facility] cardiology from a cardiac perspective.  Would like to switch care to [facility] health heart care.  Endorses daytime somnolence, followed up with PCP, sleep study has been scheduled.  Denies palpitations.  Compliant with Eliquis , Plavix  as prescribed.  Denies any bleeding issues.  Did not tolerate Lasix in the past, started on Bumex with good effect.  Endorses adequate diuresing.  Outside echo 8/24 EF>55%, mild aortic valve stenosis  Past Medical History:  Diagnosis Date   Aortic stenosis 07/26/2019   a.) TTE Bubble study 07/26/2019: mild AS (MPG 13.1); b.) TTE 05/16/2022: mod AS (MPG 12.5; AVA 0.89); c.) TTE 01/09/2023: mild AS (MPG 18; AVA 0.90)   Arthritis    Atrial fibrillation and flutter (HCC)    a.) CHA2DS2-VASc = 5 (age x2, HTN, CVA x2) as of 06/14/2023; b.) cardiac rate/rhythm maintained on oral metoprolol  tartrate; chronically anticoagulated using apixaban    Barrett esophagus    Benign neoplasm of abdomen    Bilateral carotid artery disease (HCC)    a.) doppler 07/21/2013: <50 % BICA; b.) 10/24/2021 --> radiographic string sign LICA --> PTA with stenting (10 x 8 x 40 Exact stent; c.) doppler 12/16/2021, 03/13/2022, 09/11/2022: 40-59% RICA, 1-39% LICA   Coronary  artery disease 03/30/2009   a.) LHC/PCI 03/30/2009: 75% mLCX (PTCA), 95% OM1 (2.75 x 15 mm Xience V DES); b.) LHC 07/25/2017: 100% pRCA, 95% p-mLAD, 10% oOM2, 90% lat OM2, 90% m-dLCx --> CVTS consult; c.) s/p 4v CABG   Diastolic dysfunction 07/26/2019   a.) bubble study 07/26/2019: EF 55-60%, no RWMAs, G1DD, triv TR; b.) TTE 07/23/2016: EF >55%, LVH, mild LAE, AoV sclerosis, mildly elevated PASP, G1DD; c.) TTE 05/16/2022: EF >55%, mod LVH, mod BAE, mod RVE, mod AS), triv MR, mod TR, mild PR; d.) TTE 01/09/2023: EF >55%, LVH, mild RVE, sev RAE, triv AR, mild AS, mild MR, sev TR (RVSP 39), triv PR   Diverticulitis    GERD (gastroesophageal reflux disease)    Headache    Heart murmur    History of hiatal hernia    Hypercholesterolemia    Hypertension    Hypothyroidism    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   a.) MRI brain 07/25/2019: acute lateral pontine infarct measuring 10 mm   Long term current use of clopidogrel     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   a.) MRI brain 07/25/2019: remote lacunar infact of the RIGHT lentiform nucleus and BILATERAL thalami   Nontraumatic complete tear of rotator cuff, right    On apixaban  therapy    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   a.) LIMA-LAD, SVG-PDA, SVG-D1, SVG-OM1; b.) POD1 patient with STE in V2-V3 --> Tx'd with colchicine for pericarditis  Skin cancer    T2DM (type 2 diabetes mellitus) (HCC)    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   a.) MRI brain 04/05/2022: small acute LEFT thalamic infarct   TIA (transient ischemic attack) 2015   Tubular adenoma     Past Surgical History:  Procedure Laterality Date   CAROTID PTA/STENT INTERVENTION Left 11/08/2021   Procedure: CAROTID PTA/STENT INTERVENTION;  Surgeon: Jackquelyn Mass, MD;  Location: [facility] INVASIVE CV LAB;  Service: Cardiovascular;  Laterality: Left;   COLONOSCOPY WITH PROPOFOL  N/A 01/01/2018   Procedure: COLONOSCOPY WITH PROPOFOL ;  Surgeon: Luke Salaam, MD;  Location: [facility] ENDOSCOPY;   Service: Gastroenterology;  Laterality: N/A;   CORONARY ANGIOPLASTY WITH STENT PLACEMENT Left 03/30/2009   Procedure: CORONARY ANGIOPLASTY WITH STENT PLACEMENT; Location: [facility]; Surgeons: Starlette Ebbs, MD (diagnostic) and Percival Brace, MD (interventional)   CORONARY ARTERY BYPASS GRAFT  08/06/2017   4 vessels   ESOPHAGOGASTRODUODENOSCOPY (EGD) WITH PROPOFOL  N/A 10/28/2015   Procedure: ESOPHAGOGASTRODUODENOSCOPY (EGD) WITH PROPOFOL ;  Surgeon: Stephens Eis, MD;  Location: [facility] ENDOSCOPY;  Service: Gastroenterology;  Laterality: N/A;   KNEE ARTHROSCOPY     LEFT HEART CATH AND CORONARY ANGIOGRAPHY N/A 07/25/2017   Procedure: LEFT HEART CATH AND CORONARY ANGIOGRAPHY;  Surgeon: Ronney Cola, MD;  Location: [facility] INVASIVE CV LAB;  Service: Cardiovascular;  Laterality: N/A;   NASAL SINUS SURGERY     PARTIAL KNEE ARTHROPLASTY Right 06/27/2019   Procedure: UNICOMPARTMENTAL KNEE;  Surgeon: Elner Hahn, MD;  Location: [facility] ORS;  Service: Orthopedics;  Laterality: Right;   SHOULDER ARTHROSCOPY WITH SUBACROMIAL DECOMPRESSION, ROTATOR CUFF REPAIR AND BICEP TENDON REPAIR Right 06/19/2023   Procedure: RIGHT SHOULDER ARTHROSCOPY WITH DEBRIDEMENT, DECOMPRESSION, ROTATOR CUFF REPAIR;  Surgeon: Elner Hahn, MD;  Location: [facility] ORS;  Service: Orthopedics;  Laterality: Right;   TOTAL KNEE ARTHROPLASTY Left 12/19/2018   Procedure: TOTAL KNEE ARTHROPLASTY;  Surgeon: Elner Hahn, MD;  Location: [facility] ORS;  Service: Orthopedics;  Laterality: Left;   VASECTOMY      Current Medications: Current Meds  Medication Sig   acetaminophen  (TYLENOL ) 500 MG tablet Take 1,000 mg by mouth every 6 (six) hours as needed for moderate pain (pain score 4-6).   apixaban  (ELIQUIS ) 5 MG TABS tablet Take 1 tablet (5 mg total) by mouth every 12 (twelve) hours.   atorvastatin  (LIPITOR ) 80 MG tablet Take 1 tablet (80 mg total) by mouth daily.   bumetanide (BUMEX) 2 MG tablet Take by mouth.   clopidogrel  (PLAVIX ) 75 MG tablet Take 1  tablet (75 mg total) by mouth daily.   fenofibrate  (TRICOR ) 145 MG tablet Take 1 tablet (145 mg total) by mouth daily.   levothyroxine  (SYNTHROID ) 150 MCG tablet Take 1 tablet (150 mcg total) by mouth daily on an empty stomach with a glass of water  at least 30-60 minutes before breakfast.   lisinopril  (ZESTRIL ) 10 MG tablet Take 1 tablet (10 mg total) by mouth every evening. (Patient taking differently: Take 20 mg by mouth every evening.)   metoprolol  succinate (TOPROL -XL) 100 MG 24 hr tablet Take by mouth.   Omega-3 Fatty Acids (FISH OIL) 1000 MG CAPS Take 1,000 mg by mouth 2 (two) times daily.   omeprazole  (PRILOSEC) 20 MG capsule Take 1 capsule (20 mg total) by mouth daily.   tamsulosin  (FLOMAX ) 0.4 MG CAPS capsule Take 1 capsule (0.4 mg total) by mouth daily.   triamcinolone  cream (KENALOG ) 0.5 % Apply to affected area twice daily as directed.   [DISCONTINUED] amLODipine  (NORVASC ) 5 MG tablet Take 1  tablet (5 mg total) by mouth at night.   [DISCONTINUED] metoprolol  tartrate (LOPRESSOR ) 50 MG tablet Take 1 tablet (50 mg total) by mouth 2 (two) times daily.     Allergies:   Patient has no known allergies.   Social History   Socioeconomic History   Marital status: Married    Spouse name: Not on file   Number of children: Not on file   Years of education: Not on file   Highest education level: Not on file  Occupational History   Not on file  Tobacco Use   Smoking status: Former    Current packs/day: 0.00    Average packs/day: 1 pack/day for 15.0 years (15.0 ttl pk-yrs)    Types: Cigarettes    Start date: 06/05/1956    Quit date: 06/06/1971    Years since quitting: 52.3   Smokeless tobacco: Former  Building services engineer status: Never Used  Substance and Sexual Activity   Alcohol use: Yes    Alcohol/week: 2.0 standard drinks of alcohol    Types: 1 Cans of beer, 1 Shots of liquor per week    Comment: 4 to 5 times per week   Drug use: No   Sexual activity: Not on file  Other Topics  Concern   Not on file  Social History Narrative   Not on file   Social Drivers of Health   Financial Resource Strain: Low Risk  (09/12/2023)   Received from [facility]   Overall Financial Resource Strain (CARDIA)    Difficulty of Paying Living Expenses: Not hard at all  Food Insecurity: No Food Insecurity (09/12/2023)   Received from [facility]   Hunger Vital Sign    Worried About Running Out of Food in the Last Year: Never true    Ran Out of Food in the Last Year: Never true  Transportation Needs: No Transportation Needs (09/12/2023)   Received from [facility] - Transportation    In the past 12 months, has lack of transportation kept you from medical appointments or from getting medications?: No    Lack of Transportation (Non-Medical): No  Physical Activity: Not on file  Stress: Not on file  Social Connections: Not on file     Family History: The patient's family history includes Heart attack in his mother; Lung disease in his father.  ROS:   Please see the history of present illness.     All other systems reviewed and are negative.  EKGs/Labs/Other Studies Reviewed:    The following studies were reviewed today:  EKG Interpretation Date/Time:  Monday October 01 2023 13:55:21 EDT Ventricular Rate:  92 PR Interval:    QRS Duration:  142 QT Interval:  428 QTC Calculation: 529 R Axis:   53  Text Interpretation: Atrial fibrillation Right bundle branch block T wave abnormality, consider inferolateral ischemia Confirmed by Constancia Delton ([id number]) on 10/01/2023 2:00:18 PM    Recent Labs: No results found for requested labs within last 365 days.  Recent Lipid Panel    Component Value Date/Time   CHOL 207 (H) 07/25/2019 1117   TRIG 333 (H) 07/25/2019 1117   HDL 32 (L) 07/25/2019 1117   CHOLHDL 6.5 07/25/2019 1117   VLDL 67 (H) 07/25/2019 1117   LDLCALC 108 (H) 07/25/2019 1117     Risk  Assessment/Calculations:             Physical Exam:    VS:  BP 118/64  Pulse 92   Ht 5\' 6"  (1.676 m)   Wt 181 lb (82.1 kg)   SpO2 98%   BMI 29.21 kg/m     Wt Readings from Last 3 Encounters:  10/01/23 181 lb (82.1 kg)  09/10/23 179 lb 12.8 oz (81.6 kg)  06/19/23 175 lb 0.7 oz (79.4 kg)     GEN:  Well nourished, well developed in no acute distress HEENT: Normal NECK: No JVD; No carotid bruits LYMPHATICS: No lymphadenopathy CARDIAC: Irregular irregular RESPIRATORY: Diminished breath sounds, no wheezing ABDOMEN: Soft, non-tender, non-distended MUSCULOSKELETAL:  trace edema; No deformity  SKIN: Warm and dry NEUROLOGIC:  Alert and oriented x 3 PSYCHIATRIC:  Normal affect   ASSESSMENT:    1. Persistent atrial fibrillation (HCC)   2. S/P CABG x 4   3. Primary hypertension    PLAN:    In order of problems listed above:  Persistent atrial fibrillation, heart rate controlled.  Obtain echo.  Plan DC cardioversion.  Has not missed Eliquis  dose over the past 4 weeks.  Refer to A-fib clinic.  Continue Toprol -XL 100 mg daily, Eliquis  5 mg twice daily.  Also on Plavix  due to TIA while on Eliquis  CAD s/p CABG x 4 in 2019.  Denies chest pain.  Continue Eliquis , Toprol -XL, Plavix . Hypertension, BP controlled.  Continue Toprol -XL 100 mg daily, lisinopril  20 mg daily.  Follow-up in 6 weeks      Medication Adjustments/Labs and Tests Ordered: Current medicines are reviewed at length with the patient today.  Concerns regarding medicines are outlined above.  Orders Placed This Encounter  Procedures   CBC   Basic Metabolic Panel (BMET)   Ambulatory referral to Cardiac Electrophysiology   EKG 12-Lead   ECHOCARDIOGRAM COMPLETE   No orders of the defined types were placed in this encounter.   Patient Instructions  Medication Instructions:  Your physician recommends the following medication changes.  STOP TAKING: Amiodarone Amlodipine  Metoprolol   *If you need a refill on  your cardiac medications before your next appointment, please call your pharmacy*  Lab Work: Your provider would like for you to return in 1 week to have the following labs drawn: CBC, BMP.  Please go to [facility] [address], [place] [address] You do not need an appointment.  They are open from 8 am- 4:30 pm.  Lunch from 1:00 pm- 2:00 pm You do not need to be fasting.   Testing/Procedures: Your physician has requested that you have an echocardiogram. Echocardiography is a painless test that uses sound waves to create images of your heart. It provides your doctor with information about the size and shape of your heart and how well your heart's chambers and valves are working.   You may receive an ultrasound enhancing agent through an IV if needed to better visualize your heart during the echo. This procedure takes approximately one hour.  There are no restrictions for this procedure.  This will take place at [address] [facility]) [address], [place] [address]  Please note: We ask at that you not bring children with you during ultrasound (echo/ vascular) testing. Due to room size and safety concerns, children are not allowed in the ultrasound rooms during exams. Our front office staff cannot provide observation of children in our lobby area while testing is being conducted. An adult accompanying a patient to their appointment will only be allowed in the ultrasound room at the discretion of the ultrasound technician under special circumstances. We apologize  for any inconvenience.    Follow-Up: At [facility], you and your health needs are our priority.  As part of our continuing mission to provide you with exceptional heart care, our providers are all part of one team.  This team includes your primary Cardiologist (physician) and Advanced Practice Providers or APPs (Physician Assistants and Nurse Practitioners) who all  work together to provide you with the care you need, when you need it.  Your next appointment:   2 month(s)  Provider:   You may see Dr. Junnie Olives or one of the following Advanced Practice Providers on your designated Care Team:   Laneta Pintos, NP Gildardo Labrador, PA-C Varney Gentleman, PA-C Cadence [place], PA-C Ronald Cockayne, NP Morey Ar, NP    We recommend signing up for the patient portal called "MyChart".  Sign up information is provided on this After Visit Summary.  MyChart is used to connect with patients for Virtual Visits (Telemedicine).  Patients are able to view lab/test results, encounter notes, upcoming appointments, etc.  Non-urgent messages can be sent to your provider as well.   To learn more about what you can do with MyChart, go to [email or web address].         Dear Asuncion Blanc  You are scheduled for a Cardioversion on Friday, May 8 with Dr. Junnie Olives.  Please arrive at the [facility] Entrance of [facility], [address], [place] [address] at 6:30 AM (This is 1 hour(s) prior to your procedure time).  Proceed to the Check-In Desk directly inside the entrance.  Procedure Parking: Use the entrance off of the [address] side of the hospital. Turn right upon entering and follow the driveway to parking that is directly in front of the [facility] Center. There is no valet parking available at this entrance, however there is an awning directly in front of the [facility] Center for drop off/ pick up for patients.   DIET:  Nothing to eat or drink after midnight except a sip of water  with medications (see medication instructions below)  MEDICATION INSTRUCTIONS: Continue taking your anticoagulant (blood thinner): Apixaban  (Eliquis ) and Clopidogrel  (Plavix )  You will need to continue this after your procedure until you are told by your provider that it is safe to stop.    FYI:  For your safety, and to allow us  to monitor your  vital signs accurately during the surgery/procedure we request: If you have artificial nails, gel coating, SNS etc, please have those removed prior to your surgery/procedure. Not having the nail coverings /polish removed may result in cancellation or delay of your surgery/procedure.  Your support person will be asked to wait in the waiting room during your procedure.  It is OK to have someone drop you off and come back when you are ready to be discharged.  You cannot drive after the procedure and will need someone to drive you home.  Bring your insurance cards.  *Special Note: Every effort is made to have your procedure done on time. Occasionally there are emergencies that occur at the hospital that may cause delays. Please be patient if a delay does occur.             Signed, Constancia Delton, MD  10/01/2023 3:29 PM    [facility]

## 2023-10-02 ENCOUNTER — Ambulatory Visit: Admitting: Cardiology

## 2023-10-02 LAB — BASIC METABOLIC PANEL WITH GFR
BUN/Creatinine Ratio: 18 (ref 10–24)
BUN: 16 mg/dL (ref 8–27)
CO2: 25 mmol/L (ref 20–29)
Calcium: 9.4 mg/dL (ref 8.6–10.2)
Chloride: 103 mmol/L (ref 96–106)
Creatinine, Ser: 0.87 mg/dL (ref 0.76–1.27)
Glucose: 108 mg/dL — ABNORMAL HIGH (ref 70–99)
Potassium: 4.1 mmol/L (ref 3.5–5.2)
Sodium: 144 mmol/L (ref 134–144)
eGFR: 87 mL/min/{1.73_m2} (ref >59)

## 2023-10-02 LAB — CBC
Hematocrit: 44.3 % (ref 37.5–51.0)
Hemoglobin: 13.9 g/dL (ref 13.0–17.7)
MCH: 29.8 pg (ref 26.6–33.0)
MCHC: 31.4 g/dL — ABNORMAL LOW (ref 31.5–35.7)
MCV: 95 fL (ref 79–97)
Platelets: 220 10*3/uL (ref 150–450)
RBC: 4.67 x10E6/uL (ref 4.14–5.80)
RDW: 13.9 % (ref 11.6–15.4)
WBC: 6.6 10*3/uL (ref 3.4–10.8)

## 2023-10-03 DIAGNOSIS — M6281 Muscle weakness (generalized): Secondary | ICD-10-CM | POA: Diagnosis not present

## 2023-10-03 DIAGNOSIS — G8929 Other chronic pain: Secondary | ICD-10-CM | POA: Diagnosis not present

## 2023-10-03 DIAGNOSIS — Z7901 Long term (current) use of anticoagulants: Secondary | ICD-10-CM | POA: Diagnosis not present

## 2023-10-03 DIAGNOSIS — M75121 Complete rotator cuff tear or rupture of right shoulder, not specified as traumatic: Secondary | ICD-10-CM | POA: Diagnosis not present

## 2023-10-03 DIAGNOSIS — M2011 Hallux valgus (acquired), right foot: Secondary | ICD-10-CM | POA: Diagnosis not present

## 2023-10-03 DIAGNOSIS — M25611 Stiffness of right shoulder, not elsewhere classified: Secondary | ICD-10-CM | POA: Diagnosis not present

## 2023-10-03 DIAGNOSIS — M7581 Other shoulder lesions, right shoulder: Secondary | ICD-10-CM | POA: Diagnosis not present

## 2023-10-03 DIAGNOSIS — M25511 Pain in right shoulder: Secondary | ICD-10-CM | POA: Diagnosis not present

## 2023-10-03 DIAGNOSIS — M2041 Other hammer toe(s) (acquired), right foot: Secondary | ICD-10-CM | POA: Diagnosis not present

## 2023-10-03 DIAGNOSIS — Z8673 Personal history of transient ischemic attack (TIA), and cerebral infarction without residual deficits: Secondary | ICD-10-CM | POA: Diagnosis not present

## 2023-10-04 DIAGNOSIS — I480 Paroxysmal atrial fibrillation: Secondary | ICD-10-CM | POA: Diagnosis not present

## 2023-10-04 DIAGNOSIS — I350 Nonrheumatic aortic (valve) stenosis: Secondary | ICD-10-CM | POA: Diagnosis not present

## 2023-10-04 DIAGNOSIS — I361 Nonrheumatic tricuspid (valve) insufficiency: Secondary | ICD-10-CM | POA: Diagnosis not present

## 2023-10-04 DIAGNOSIS — I10 Essential (primary) hypertension: Secondary | ICD-10-CM | POA: Diagnosis not present

## 2023-10-05 DIAGNOSIS — G8929 Other chronic pain: Secondary | ICD-10-CM | POA: Diagnosis not present

## 2023-10-05 DIAGNOSIS — M25511 Pain in right shoulder: Secondary | ICD-10-CM | POA: Diagnosis not present

## 2023-10-05 DIAGNOSIS — M6281 Muscle weakness (generalized): Secondary | ICD-10-CM | POA: Diagnosis not present

## 2023-10-05 DIAGNOSIS — M75121 Complete rotator cuff tear or rupture of right shoulder, not specified as traumatic: Secondary | ICD-10-CM | POA: Diagnosis not present

## 2023-10-05 DIAGNOSIS — M25611 Stiffness of right shoulder, not elsewhere classified: Secondary | ICD-10-CM | POA: Diagnosis not present

## 2023-10-05 DIAGNOSIS — M7581 Other shoulder lesions, right shoulder: Secondary | ICD-10-CM | POA: Diagnosis not present

## 2023-10-08 ENCOUNTER — Telehealth (INDEPENDENT_AMBULATORY_CARE_PROVIDER_SITE_OTHER): Payer: Self-pay

## 2023-10-08 NOTE — Telephone Encounter (Signed)
 We can see if Dr. Prescilla Brod has available time on Thursday, unfortunately he will be in surgery this afternoon

## 2023-10-10 DIAGNOSIS — Z125 Encounter for screening for malignant neoplasm of prostate: Secondary | ICD-10-CM | POA: Diagnosis not present

## 2023-10-10 DIAGNOSIS — M75121 Complete rotator cuff tear or rupture of right shoulder, not specified as traumatic: Secondary | ICD-10-CM | POA: Diagnosis not present

## 2023-10-10 DIAGNOSIS — M25511 Pain in right shoulder: Secondary | ICD-10-CM | POA: Diagnosis not present

## 2023-10-10 DIAGNOSIS — R7303 Prediabetes: Secondary | ICD-10-CM | POA: Diagnosis not present

## 2023-10-10 DIAGNOSIS — I10 Essential (primary) hypertension: Secondary | ICD-10-CM | POA: Diagnosis not present

## 2023-10-10 DIAGNOSIS — G8929 Other chronic pain: Secondary | ICD-10-CM | POA: Diagnosis not present

## 2023-10-10 DIAGNOSIS — E039 Hypothyroidism, unspecified: Secondary | ICD-10-CM | POA: Diagnosis not present

## 2023-10-10 DIAGNOSIS — E7800 Pure hypercholesterolemia, unspecified: Secondary | ICD-10-CM | POA: Diagnosis not present

## 2023-10-10 DIAGNOSIS — M25611 Stiffness of right shoulder, not elsewhere classified: Secondary | ICD-10-CM | POA: Diagnosis not present

## 2023-10-10 DIAGNOSIS — M6281 Muscle weakness (generalized): Secondary | ICD-10-CM | POA: Diagnosis not present

## 2023-10-10 DIAGNOSIS — Z79899 Other long term (current) drug therapy: Secondary | ICD-10-CM | POA: Diagnosis not present

## 2023-10-10 DIAGNOSIS — M7581 Other shoulder lesions, right shoulder: Secondary | ICD-10-CM | POA: Diagnosis not present

## 2023-10-11 ENCOUNTER — Ambulatory Visit (INDEPENDENT_AMBULATORY_CARE_PROVIDER_SITE_OTHER): Admitting: Vascular Surgery

## 2023-10-11 ENCOUNTER — Encounter (INDEPENDENT_AMBULATORY_CARE_PROVIDER_SITE_OTHER): Payer: Self-pay | Admitting: Vascular Surgery

## 2023-10-11 VITALS — BP 147/90 | HR 73 | Resp 16 | Wt 181.2 lb

## 2023-10-11 DIAGNOSIS — M7989 Other specified soft tissue disorders: Secondary | ICD-10-CM | POA: Diagnosis not present

## 2023-10-11 DIAGNOSIS — I10 Essential (primary) hypertension: Secondary | ICD-10-CM | POA: Diagnosis not present

## 2023-10-11 DIAGNOSIS — E782 Mixed hyperlipidemia: Secondary | ICD-10-CM

## 2023-10-11 DIAGNOSIS — M79669 Pain in unspecified lower leg: Secondary | ICD-10-CM | POA: Diagnosis not present

## 2023-10-11 MED ORDER — SODIUM CHLORIDE 0.9 % IV SOLN: INTRAVENOUS | Status: DC

## 2023-10-11 NOTE — Progress Notes (Signed)
 Subjective:    Patient ID: Warren Ingram, male    DOB: 1940/11/21, 83 y.o.   MRN: [id number] Chief Complaint  Patient presents with   Follow-up    Carotid ultrasound results    Warren Ingram is an 83 year old male who presents to clinic today with complaints of bilateral lower extremity swelling with pain right greater than left.  He is an established patient with us  for carotid stenosis and was last seen in April by Dr. Devon Fogo.  According to the patient and his wife he has had bilateral lower extremity swelling for a while now but was never officially worked up or seen for this problem.  He endorses pain while at rest as well as on ambulation.  And he now endorses that it is more difficult to walk with his right leg being as swollen as it is.  He endorses he was told at some prior time a couple years ago to wear compression stockings or socks for his swelling but he did not comply.    Review of Systems  Constitutional: Negative.   HENT: Negative.    Eyes: Negative.   Respiratory: Negative.    Cardiovascular: Negative.   Gastrointestinal: Negative.   Genitourinary: Negative.   Skin:  Positive for color change.       Bilateral lower extremity edema Lymphedema and vascular insufficiency   Neurological: Negative.   Psychiatric/Behavioral: Negative.    All other systems reviewed and are negative.      Objective:    Physical Exam Vitals reviewed.  Constitutional:      Appearance: Normal appearance. He is normal weight.  HENT:     Head: Normocephalic.  Eyes:     Pupils: Pupils are equal, round, and reactive to light.  Cardiovascular:     Rate and Rhythm: Normal rate. Rhythm irregular.     Pulses: Normal pulses.     Heart sounds: Normal heart sounds.  Pulmonary:     Effort: Pulmonary effort is normal.     Breath sounds: Normal breath sounds.  Abdominal:     General: Abdomen is flat. Bowel sounds are normal.     Palpations: Abdomen is soft.   Musculoskeletal:        General: Swelling present.     Right lower leg: Edema present.     Left lower leg: Edema present.  Skin:    General: Skin is warm and dry.     Capillary Refill: Capillary refill takes 2 to 3 seconds.  Neurological:     General: No focal deficit present.     Mental Status: He is alert and oriented to person, place, and time. Mental status is at baseline.  Psychiatric:        Mood and Affect: Mood normal.        Behavior: Behavior normal.        Thought Content: Thought content normal.        Judgment: Judgment normal.     BP (!) 147/90   Pulse 73   Resp 16   Wt 181 lb 3.2 oz (82.2 kg)   BMI 29.25 kg/m   Past Medical History:  Diagnosis Date   Aortic stenosis 07/26/2019   a.) TTE Bubble study 07/26/2019: mild AS (MPG 13.1); b.) TTE 05/16/2022: mod AS (MPG 12.5; AVA 0.89); c.) TTE 01/09/2023: mild AS (MPG 18; AVA 0.90)   Arthritis    Atrial fibrillation and flutter (HCC)    a.) CHA2DS2-VASc = 5 (age x2, HTN, CVA x2)  as of 06/14/2023; b.) cardiac rate/rhythm maintained on oral metoprolol  tartrate; chronically anticoagulated using apixaban    Barrett esophagus    Benign neoplasm of abdomen    Bilateral carotid artery disease (HCC)    a.) doppler 07/21/2013: <50 % BICA; b.) 10/24/2021 --> radiographic string sign LICA --> PTA with stenting (10 x 8 x 40 Exact stent; c.) doppler 12/16/2021, 03/13/2022, 09/11/2022: 40-59% RICA, 1-39% LICA   Coronary artery disease 03/30/2009   a.) LHC/PCI 03/30/2009: 75% mLCX (PTCA), 95% OM1 (2.75 x 15 mm Xience V DES); b.) LHC 07/25/2017: 100% pRCA, 95% p-mLAD, 10% oOM2, 90% lat OM2, 90% m-dLCx --> CVTS consult; c.) s/p 4v CABG   Diastolic dysfunction 07/26/2019   a.) bubble study 07/26/2019: EF 55-60%, no RWMAs, G1DD, triv TR; b.) TTE 07/23/2016: EF >55%, LVH, mild LAE, AoV sclerosis, mildly elevated PASP, G1DD; c.) TTE 05/16/2022: EF >55%, mod LVH, mod BAE, mod RVE, mod AS), triv MR, mod TR, mild PR; d.) TTE 01/09/2023: EF  >55%, LVH, mild RVE, sev RAE, triv AR, mild AS, mild MR, sev TR (RVSP 39), triv PR   Diverticulitis    GERD (gastroesophageal reflux disease)    Headache    Heart murmur    History of hiatal hernia    Hypercholesterolemia    Hypertension    Hypothyroidism    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   a.) MRI brain 07/25/2019: acute lateral pontine infarct measuring 10 mm   Long term current use of clopidogrel     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   a.) MRI brain 07/25/2019: remote lacunar infact of the RIGHT lentiform nucleus and BILATERAL thalami   Nontraumatic complete tear of rotator cuff, right    On apixaban  therapy    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   a.) LIMA-LAD, SVG-PDA, SVG-D1, SVG-OM1; b.) POD1 patient with STE in V2-V3 --> Tx'd with colchicine for pericarditis   Skin cancer    T2DM (type 2 diabetes mellitus) (HCC)    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   a.) MRI brain 04/05/2022: small acute LEFT thalamic infarct   TIA (transient ischemic attack) 2015   Tubular adenoma     Social History   Socioeconomic History   Marital status: Married    Spouse name: Not on file   Number of children: Not on file   Years of education: Not on file   Highest education level: Not on file  Occupational History   Not on file  Tobacco Use   Smoking status: Former    Current packs/day: 0.00    Average packs/day: 1 pack/day for 15.0 years (15.0 ttl pk-yrs)    Types: Cigarettes    Start date: 06/05/1956    Quit date: 06/06/1971    Years since quitting: 52.3   Smokeless tobacco: Former  Building services engineer status: Never Used  Substance and Sexual Activity   Alcohol use: Yes    Alcohol/week: 2.0 standard drinks of alcohol    Types: 1 Cans of beer, 1 Shots of liquor per week    Comment: 4 to 5 times per week   Drug use: No   Sexual activity: Not on file  Other Topics Concern   Not on file  Social History Narrative   Not on file   Social Drivers of Health   Financial Resource  Strain: Low Risk  (09/12/2023)   Received from [facility]   Overall Financial Resource Strain (CARDIA)    Difficulty of Paying Living Expenses: Not hard at  all  Food Insecurity: No Food Insecurity (09/12/2023)   Received from [facility]   Hunger Vital Sign    Worried About Running Out of Food in the Last Year: Never true    Ran Out of Food in the Last Year: Never true  Transportation Needs: No Transportation Needs (09/12/2023)   Received from [facility] - Transportation    In the past 12 months, has lack of transportation kept you from medical appointments or from getting medications?: No    Lack of Transportation (Non-Medical): No  Physical Activity: Not on file  Stress: Not on file  Social Connections: Not on file  Intimate Partner Violence: Not on file    Past Surgical History:  Procedure Laterality Date   CAROTID PTA/STENT INTERVENTION Left 11/08/2021   Procedure: CAROTID PTA/STENT INTERVENTION;  Surgeon: Jackquelyn Mass, MD;  Location: [facility] INVASIVE CV LAB;  Service: Cardiovascular;  Laterality: Left;   COLONOSCOPY WITH PROPOFOL  N/A 01/01/2018   Procedure: COLONOSCOPY WITH PROPOFOL ;  Surgeon: Luke Salaam, MD;  Location: [facility] ENDOSCOPY;  Service: Gastroenterology;  Laterality: N/A;   CORONARY ANGIOPLASTY WITH STENT PLACEMENT Left 03/30/2009   Procedure: CORONARY ANGIOPLASTY WITH STENT PLACEMENT; Location: [facility]; Surgeons: Starlette Ebbs, MD (diagnostic) and Percival Brace, MD (interventional)   CORONARY ARTERY BYPASS GRAFT  08/06/2017   4 vessels   ESOPHAGOGASTRODUODENOSCOPY (EGD) WITH PROPOFOL  N/A 10/28/2015   Procedure: ESOPHAGOGASTRODUODENOSCOPY (EGD) WITH PROPOFOL ;  Surgeon: Stephens Eis, MD;  Location: [facility] ENDOSCOPY;  Service: Gastroenterology;  Laterality: N/A;   KNEE ARTHROSCOPY     LEFT HEART CATH AND CORONARY ANGIOGRAPHY N/A 07/25/2017   Procedure: LEFT HEART CATH AND CORONARY ANGIOGRAPHY;  Surgeon: Ronney Cola, MD;  Location: [facility] INVASIVE CV LAB;  Service: Cardiovascular;  Laterality: N/A;   NASAL SINUS SURGERY     PARTIAL KNEE ARTHROPLASTY Right 06/27/2019   Procedure: UNICOMPARTMENTAL KNEE;  Surgeon: Elner Hahn, MD;  Location: [facility] ORS;  Service: Orthopedics;  Laterality: Right;   SHOULDER ARTHROSCOPY WITH SUBACROMIAL DECOMPRESSION, ROTATOR CUFF REPAIR AND BICEP TENDON REPAIR Right 06/19/2023   Procedure: RIGHT SHOULDER ARTHROSCOPY WITH DEBRIDEMENT, DECOMPRESSION, ROTATOR CUFF REPAIR;  Surgeon: Elner Hahn, MD;  Location: [facility] ORS;  Service: Orthopedics;  Laterality: Right;   TOTAL KNEE ARTHROPLASTY Left 12/19/2018   Procedure: TOTAL KNEE ARTHROPLASTY;  Surgeon: Elner Hahn, MD;  Location: [facility] ORS;  Service: Orthopedics;  Laterality: Left;   VASECTOMY      Family History  Problem Relation Age of Onset   Heart attack Mother    Lung disease Father     No Known Allergies     Latest Ref Rng & Units 10/01/2023    3:13 PM 04/05/2022    9:25 AM 11/09/2021    4:17 AM  CBC  WBC 3.4 - 10.8 x10E3/uL 6.6  7.1  6.1   Hemoglobin 13.0 - 17.7 g/dL 47.8  29.5  62.1   Hematocrit 37.5 - 51.0 % 44.3  47.5  39.3   Platelets 150 - 450 x10E3/uL 220  210  185        CMP     Component Value Date/Time   NA 144 10/01/2023 1513   K 4.1 10/01/2023 1513   CL 103 10/01/2023 1513   CO2 25 10/01/2023 1513   GLUCOSE 108 (H) 10/01/2023 1513   GLUCOSE 135 (H) 04/05/2022 0925   BUN 16 10/01/2023 1513   CREATININE 0.87 10/01/2023 1513   CALCIUM  9.4 10/01/2023 1513   PROT  8.1 07/25/2019 1117   ALBUMIN 4.2 07/25/2019 1117   AST 24 07/25/2019 1117   ALT 30 07/25/2019 1117   ALKPHOS 140 (H) 07/25/2019 1117   BILITOT 0.6 07/25/2019 1117   EGFR 87 10/01/2023 1513   GFRNONAA 60 (L) 04/05/2022 0925     No results found.     Assessment & Plan:   1. Pain and swelling of lower leg, unspecified laterality (Primary) Recommend:  I have had a long discussion with the patient regarding swelling  and why it  causes symptoms.  Patient will begin wearing graduated compression on a daily basis a prescription was given. The patient will  wear the stockings first thing in the morning and removing them in the evening. The patient is instructed specifically not to sleep in the stockings.   In addition, behavioral modification will be initiated.  This will include frequent elevation, use of over the counter pain medications and exercise such as walking.  Consideration for a lymph pump will also be made based upon the effectiveness of conservative therapy.  This would help to improve the edema control and prevent sequela such as ulcers and infections   Patient should undergo duplex ultrasound of the venous system to ensure that DVT or reflux is not present.  The patient will follow-up with me after the ultrasound.   2. Benign essential HTN Continue antihypertensive medications as already ordered, these medications have been reviewed and there are no changes at this time.  3. Mixed hyperlipidemia Continue statin as ordered and reviewed, no changes at this time   Current Outpatient Medications on File Prior to Visit  Medication Sig Dispense Refill   acetaminophen  (TYLENOL ) 500 MG tablet Take 1,000 mg by mouth every 6 (six) hours as needed for moderate pain (pain score 4-6).     apixaban  (ELIQUIS ) 5 MG TABS tablet Take 1 tablet (5 mg total) by mouth every 12 (twelve) hours. 180 tablet 3   atorvastatin  (LIPITOR ) 80 MG tablet Take 1 tablet (80 mg total) by mouth daily. 90 tablet 3   bumetanide (BUMEX) 2 MG tablet Take 2 mg by mouth daily.     clopidogrel  (PLAVIX ) 75 MG tablet Take 1 tablet (75 mg total) by mouth daily. 90 tablet 3   fenofibrate  (TRICOR ) 145 MG tablet Take 1 tablet (145 mg total) by mouth daily. 90 tablet 3   GLUCOSAMINE-CHONDROITIN PO Take 1 capsule by mouth daily.     levocetirizine (XYZAL) 5 MG tablet Take 1 tablet by mouth every evening.     levothyroxine  (SYNTHROID ) 150 MCG  tablet Take 1 tablet (150 mcg total) by mouth daily on an empty stomach with a glass of water  at least 30-60 minutes before breakfast. 90 tablet 3   metoprolol  succinate (TOPROL -XL) 100 MG 24 hr tablet Take 100 mg by mouth daily.     Omega-3 Fatty Acids (FISH OIL) 1000 MG CAPS Take 1,000 mg by mouth 2 (two) times daily.     omeprazole  (PRILOSEC) 20 MG capsule Take 1 capsule (20 mg total) by mouth daily. 90 capsule 3   silver sulfADIAZINE (SILVADENE) 1 % cream Apply 1 Application topically daily as needed (Buttocks).     tamsulosin  (FLOMAX ) 0.4 MG CAPS capsule Take 1 capsule (0.4 mg total) by mouth daily. 90 capsule 3   triamcinolone  (NASACORT  ALLERGY 24HR) 55 MCG/ACT AERO nasal inhaler Place 2 sprays into the nose daily as needed (Congestion).     triamcinolone  cream (KENALOG ) 0.5 % Apply to affected area twice daily as  directed. (Patient taking differently: Apply 1 Application topically daily as needed (Rash).) 454 g 3   lisinopril  (ZESTRIL ) 10 MG tablet Take 1 tablet (10 mg total) by mouth every evening. (Patient taking differently: Take 20 mg by mouth every evening.) 90 tablet 3   No current facility-administered medications on file prior to visit.    There are no Patient Instructions on file for this visit. No follow-ups on file.   Annamaria Barrette, NP

## 2023-10-12 ENCOUNTER — Encounter: Payer: Self-pay | Admitting: Cardiology

## 2023-10-12 ENCOUNTER — Encounter
Admission: RE | Disposition: A | Discharge status: Home / Self Care | Payer: Self-pay | Source: Home / Self Care | Attending: Cardiology

## 2023-10-12 ENCOUNTER — Ambulatory Visit: Admitting: Anesthesiology

## 2023-10-12 ENCOUNTER — Ambulatory Visit
Admission: RE | Admit: 2023-10-12 | Discharge: 2023-10-12 | Disposition: A | Discharge status: Home / Self Care | Attending: Cardiology | Admitting: Cardiology

## 2023-10-12 DIAGNOSIS — I4891 Unspecified atrial fibrillation: Secondary | ICD-10-CM | POA: Insufficient documentation

## 2023-10-12 DIAGNOSIS — I10 Essential (primary) hypertension: Secondary | ICD-10-CM | POA: Diagnosis not present

## 2023-10-12 DIAGNOSIS — Z87891 Personal history of nicotine dependence: Secondary | ICD-10-CM | POA: Diagnosis not present

## 2023-10-12 DIAGNOSIS — I4892 Unspecified atrial flutter: Secondary | ICD-10-CM | POA: Insufficient documentation

## 2023-10-12 DIAGNOSIS — E119 Type 2 diabetes mellitus without complications: Secondary | ICD-10-CM | POA: Diagnosis not present

## 2023-10-12 DIAGNOSIS — Z7902 Long term (current) use of antithrombotics/antiplatelets: Secondary | ICD-10-CM | POA: Diagnosis not present

## 2023-10-12 DIAGNOSIS — E785 Hyperlipidemia, unspecified: Secondary | ICD-10-CM | POA: Diagnosis not present

## 2023-10-12 DIAGNOSIS — I350 Nonrheumatic aortic (valve) stenosis: Secondary | ICD-10-CM | POA: Insufficient documentation

## 2023-10-12 DIAGNOSIS — Z951 Presence of aortocoronary bypass graft: Secondary | ICD-10-CM | POA: Diagnosis not present

## 2023-10-12 DIAGNOSIS — Z7901 Long term (current) use of anticoagulants: Secondary | ICD-10-CM | POA: Insufficient documentation

## 2023-10-12 DIAGNOSIS — K219 Gastro-esophageal reflux disease without esophagitis: Secondary | ICD-10-CM | POA: Diagnosis not present

## 2023-10-12 DIAGNOSIS — I2510 Atherosclerotic heart disease of native coronary artery without angina pectoris: Secondary | ICD-10-CM | POA: Insufficient documentation

## 2023-10-12 DIAGNOSIS — I4819 Other persistent atrial fibrillation: Secondary | ICD-10-CM | POA: Diagnosis not present

## 2023-10-12 HISTORY — PX: CARDIOVERSION: SHX1299

## 2023-10-12 SURGERY — CARDIOVERSION
Anesthesia: General

## 2023-10-12 MED ORDER — EPHEDRINE 5 MG/ML INJ
INTRAVENOUS | Status: AC
  Filled 2023-10-12: qty 5

## 2023-10-12 MED ORDER — PROPOFOL 10 MG/ML IV BOLUS
INTRAVENOUS | Status: DC | PRN
  Administered 2023-10-12: 50 mg via INTRAVENOUS
  Administered 2023-10-12: 20 mg via INTRAVENOUS

## 2023-10-12 MED ORDER — GLYCOPYRROLATE 0.2 MG/ML IJ SOLN
INTRAMUSCULAR | Status: AC
  Filled 2023-10-12: qty 1

## 2023-10-12 MED ORDER — PROPOFOL 10 MG/ML IV BOLUS
INTRAVENOUS | Status: AC
  Filled 2023-10-12: qty 40

## 2023-10-12 MED ORDER — PHENYLEPHRINE 80 MCG/ML (10ML) SYRINGE FOR IV PUSH (FOR BLOOD PRESSURE SUPPORT)
PREFILLED_SYRINGE | INTRAVENOUS | Status: AC
  Filled 2023-10-12: qty 10

## 2023-10-12 NOTE — Procedures (Signed)
 Cardioversion procedure note For atrial fibrillation.  Procedure Details:  Consent: Risks of procedure as well as the alternatives and risks of each were explained to the (patient/caregiver).  Consent for procedure obtained.  Time Out: Verified patient identification, verified procedure, site/side was marked, verified correct patient position, special equipment/implants available, medications/allergies/relevent history reviewed, required imaging and test results available.  Performed  Patient placed on cardiac monitor, pulse oximetry, supplemental oxygen as necessary.   Sedation given: propofol  IV per anesthesia team Pacer pads placed anterior and posterior chest.   Cardioverted 1 time(s).   Cardioverted at  200J. Synchronized biphasic Converted to NSR   Evaluation: Findings: Post procedure EKG shows: NSR Complications: None Patient did tolerate procedure well. Continue toprol  xl, eliquis . Keep appointment with EP as outpatient.  Time Spent Directly with the Patient:  25 minutes   Constancia Delton, M.D.

## 2023-10-12 NOTE — Transfer of Care (Signed)
 Immediate Anesthesia Transfer of Care Note  Patient: Warren Ingram  Procedure(s) Performed: CARDIOVERSION  Patient Location: [facility]  Anesthesia Type:General  Level of Consciousness: drowsy and patient cooperative  Airway & Oxygen Therapy: Patient Spontanous Breathing and Patient connected to nasal cannula oxygen  Post-op Assessment: Report given to RN and Post -op Vital signs reviewed and stable  Post vital signs: Reviewed and stable  Last Vitals:  Vitals Value Taken Time  BP 91/69   Temp    Pulse 59   Resp 16   SpO2 97%     Last Pain:  Vitals:   10/12/23 0701  TempSrc: Oral  PainSc: 0-No pain         Complications: No notable events documented.

## 2023-10-12 NOTE — Anesthesia Preprocedure Evaluation (Addendum)
 Anesthesia Evaluation  Patient identified by MRN, date of birth, ID band Patient awake    Reviewed: Allergy & Precautions, NPO status , Patient's Chart, lab work & pertinent test results  History of Anesthesia Complications Negative for: history of anesthetic complications  Airway Mallampati: III  TM Distance: >3 FB Neck ROM: full    Dental no notable dental hx.    Pulmonary former smoker   Pulmonary exam normal        Cardiovascular hypertension, + CAD, + CABG and + Peripheral Vascular Disease  + dysrhythmias Atrial Fibrillation + Valvular Problems/Murmurs AS   Echo NORMAL LEFT VENTRICULAR SYSTOLIC FUNCTION WITH MILD LVH    NORMAL RIGHT VENTRICULAR SYSTOLIC FUNCTION    VALVULAR REGURGITATION: TRIVIAL AR, MILD MR, TRIVIAL PR, SEVERE TR    VALVULAR STENOSIS: MILD AS    SEVERE TR WITH AN ESTIMATED RVSP OF 39 mmHg (EROA = 0.7 cm^2)    3D acquisition and reconstructions were performed as part of this    examination to more accurately quantify the effects of identified    structural abnormalities as part of the exam. (post-processing on an    Independent workstation).     Compared with prior Echo study on 05/16/2022: [facility]: THE    AORTIC VALVE LOOKS SIMILAR IN APPEARANCE AND THE GRADIENTS ARE SIMILAR.    HOWEVER THESE GRADIENTS WOULD CLASSIFY AS MILD AORTIC STENOSIS.    SEVERE TR    NO SIGIFICANT CHANGE IN BIVENTRICULAR FUNCTION     Neuro/Psych  Headaches TIACVA  negative psych ROS   GI/Hepatic Neg liver ROS, hiatal hernia,GERD  Medicated,,  Endo/Other  diabetes, Type 2Hypothyroidism    Renal/GU negative Renal ROS  negative genitourinary   Musculoskeletal   Abdominal   Peds  Hematology negative hematology ROS (+)   Anesthesia Other Findings Past Medical History: 07/26/2019: Aortic stenosis     Comment:  a.) TTE Bubble study 07/26/2019: mild AS (MPG 13.1); b.)              TTE 05/16/2022: mod AS  (MPG 12.5; AVA 0.89); c.) TTE               01/09/2023: mild AS (MPG 18; AVA 0.90) No date: Arthritis No date: Atrial fibrillation and flutter (HCC)     Comment:  a.) CHA2DS2-VASc = 5 (age x2, HTN, CVA x2) as of               06/14/2023; b.) cardiac rate/rhythm maintained on oral               metoprolol  tartrate; chronically anticoagulated using               apixaban  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 No date: Benign neoplasm of abdomen No date: Bilateral carotid artery disease (HCC)     Comment:  a.) doppler 07/21/2013: <50 % BICA; b.) 10/24/2021 -->               radiographic string sign LICA --> PTA with stenting (10 x              8 x 40 Exact stent; c.) doppler 12/16/2021, 03/13/2022,               09/11/2022: 40-59% RICA, 1-39% LICA 03/30/2009: Coronary artery disease     Comment:  a.) LHC/PCI 03/30/2009: 75% mLCX (PTCA), 95% OM1 (2.75 x              15 mm Xience V DES); b.) LHC 07/25/2017: 100%  pRCA, 95%               p-mLAD, 10% oOM2, 90% lat OM2, 90% m-dLCx --> CVTS               consult; c.) s/p 4v CABG 07/26/2019: Diastolic dysfunction     Comment:  a.) bubble study 07/26/2019: EF 55-60%, no RWMAs, G1DD,               triv TR; b.) TTE 07/23/2016: EF >55%, LVH, mild LAE, AoV               sclerosis, mildly elevated PASP, G1DD; c.) TTE               05/16/2022: EF >55%, mod LVH, mod BAE, mod RVE, mod AS),               triv MR, mod TR, mild PR; d.) TTE 01/09/2023: EF >55%,               LVH, mild RVE, sev RAE, triv AR, mild AS, mild MR, sev TR              (RVSP 39), triv PR No date: Diverticulitis No date: GERD (gastroesophageal reflux disease) No date: Headache No date: Heart murmur No date: History of hiatal hernia No date: Hypercholesterolemia No date: Hypertension No date: Hypothyroidism 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)     Comment:  a.) MRI brain 07/25/2019: acute lateral pontine infarct               measuring 10 mm No date: Long term current use of  clopidogrel  07/25/2019: Multiple lacunar infarcts [facility])     Comment:  a.) MRI brain 07/25/2019: remote lacunar infact of the               RIGHT lentiform nucleus and BILATERAL thalami No date: Nontraumatic complete tear of rotator cuff, right No date: On apixaban  therapy 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     Comment:  a.) LIMA-LAD, SVG-PDA, SVG-D1, SVG-OM1; b.) POD1 patient              with STE in V2-V3 --> Tx'd with colchicine for               pericarditis No date: Skin cancer No date: T2DM (type 2 diabetes mellitus) (HCC) 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)     Comment:  a.) MRI brain 04/05/2022: small acute LEFT thalamic               infarct 2015: TIA (transient ischemic attack) No date: Tubular adenoma  Past Surgical History: 11/08/2021: CAROTID PTA/STENT INTERVENTION; Left     Comment:  Procedure: CAROTID PTA/STENT INTERVENTION;  Surgeon:               Jackquelyn Mass, MD;  Location: [facility] INVASIVE CV LAB;               Service: Cardiovascular;  Laterality: Left; 01/01/2018: COLONOSCOPY WITH PROPOFOL ; N/A     Comment:  Procedure: COLONOSCOPY WITH PROPOFOL ;  Surgeon: Luke Salaam, MD;  Location: [facility] ENDOSCOPY;  Service:               Gastroenterology;  Laterality: N/A; 03/30/2009: CORONARY ANGIOPLASTY WITH STENT PLACEMENT; Left     Comment:  Procedure: CORONARY ANGIOPLASTY WITH STENT PLACEMENT;               Location: [facility];  Surgeons: Starlette Ebbs, MD (diagnostic)               and Percival Brace, MD (interventional) 08/06/2017: CORONARY ARTERY BYPASS GRAFT     Comment:  4 vessels 10/28/2015: ESOPHAGOGASTRODUODENOSCOPY (EGD) WITH PROPOFOL ; N/A     Comment:  Procedure: ESOPHAGOGASTRODUODENOSCOPY (EGD) WITH               PROPOFOL ;  Surgeon: Stephens Eis, MD;  Location: [facility]               ENDOSCOPY;  Service: Gastroenterology;  Laterality: N/A; No date: KNEE ARTHROSCOPY 07/25/2017: LEFT HEART CATH AND CORONARY ANGIOGRAPHY; N/A     Comment:  Procedure: LEFT HEART  CATH AND CORONARY ANGIOGRAPHY;                Surgeon: Ronney Cola, MD;  Location: [facility] INVASIVE CV              LAB;  Service: Cardiovascular;  Laterality: N/A; No date: NASAL SINUS SURGERY 06/27/2019: PARTIAL KNEE ARTHROPLASTY; Right     Comment:  Procedure: UNICOMPARTMENTAL KNEE;  Surgeon: Elner Hahn, MD;  Location: [facility] ORS;  Service: Orthopedics;                Laterality: Right; 06/19/2023: SHOULDER ARTHROSCOPY WITH SUBACROMIAL DECOMPRESSION,  ROTATOR CUFF REPAIR AND BICEP TENDON REPAIR; Right     Comment:  Procedure: RIGHT SHOULDER ARTHROSCOPY WITH DEBRIDEMENT,               DECOMPRESSION, ROTATOR CUFF REPAIR;  Surgeon: Elner Hahn, MD;  Location: [facility] ORS;  Service: Orthopedics;                Laterality: Right; 12/19/2018: TOTAL KNEE ARTHROPLASTY; Left     Comment:  Procedure: TOTAL KNEE ARTHROPLASTY;  Surgeon: Elner Hahn, MD;  Location: [facility] ORS;  Service: Orthopedics;                Laterality: Left; No date: VASECTOMY  BMI    Body Mass Index: 28.25 kg/m      Reproductive/Obstetrics negative OB ROS                              Anesthesia Physical Anesthesia Plan  ASA: 3  Anesthesia Plan: General   Post-op Pain Management: Minimal or no pain anticipated   Induction: Intravenous  PONV Risk Score and Plan: 1 and Propofol  infusion and TIVA  Airway Management Planned: Natural Airway and Nasal Cannula  Additional Equipment:   Intra-op Plan:   Post-operative Plan:   Informed Consent: I have reviewed the patients History and Physical, chart, labs and discussed the procedure including the risks, benefits and alternatives for the proposed anesthesia with the patient or authorized representative who has indicated his/her understanding and acceptance.     Dental Advisory Given  Plan Discussed with: Anesthesiologist, CRNA and Surgeon  Anesthesia Plan Comments: (Patient consented for  risks of anesthesia including but not limited to:  - adverse reactions to medications - risk of airway placement if required - damage to eyes, teeth, lips or other oral mucosa - nerve damage due to positioning  - sore throat or hoarseness - Damage to heart, brain, nerves,  lungs, other parts of body or loss of life  Patient voiced understanding and assent.)         Anesthesia Quick Evaluation

## 2023-10-12 NOTE — Interval H&P Note (Signed)
 History and Physical Interval Note:  10/12/2023 7:45 AM  Warren Ingram  has presented today for surgery, with the diagnosis of persistent  Afib.  The various methods of treatment have been discussed with the patient and family. After consideration of risks, benefits and other options for treatment, the patient has consented to  Procedure(s): CARDIOVERSION (N/A) as a surgical intervention.  The patient's history has been reviewed, patient examined, no change in status, stable for surgery.  I have reviewed the patient's chart and labs.  Questions were answered to the patient's satisfaction.     Polly Brink Agbor-Etang

## 2023-10-14 NOTE — Anesthesia Postprocedure Evaluation (Signed)
 Anesthesia Post Note  Patient: Warren Ingram  Procedure(s) Performed: CARDIOVERSION  Patient location during evaluation: Specials Recovery Anesthesia Type: General Level of consciousness: awake and alert Pain management: pain level controlled Vital Signs Assessment: post-procedure vital signs reviewed and stable Respiratory status: spontaneous breathing, nonlabored ventilation, respiratory function stable and patient connected to nasal cannula oxygen Cardiovascular status: blood pressure returned to baseline and stable Postop Assessment: no apparent nausea or vomiting Anesthetic complications: no   No notable events documented.   Last Vitals:  Vitals:   10/12/23 0815 10/12/23 0830  BP: (!) 85/48 98/64  Pulse: 67 73  Resp: 19 (!) 22  Temp:    SpO2: 96% 97%    Last Pain:  Vitals:   10/12/23 0830  TempSrc:   PainSc: 0-No pain                 Nancey Awkward

## 2023-10-15 ENCOUNTER — Encounter: Payer: Self-pay | Admitting: Cardiology

## 2023-10-15 ENCOUNTER — Ambulatory Visit (INDEPENDENT_AMBULATORY_CARE_PROVIDER_SITE_OTHER): Admitting: Vascular Surgery

## 2023-10-15 DIAGNOSIS — Z8673 Personal history of transient ischemic attack (TIA), and cerebral infarction without residual deficits: Secondary | ICD-10-CM | POA: Diagnosis not present

## 2023-10-15 DIAGNOSIS — G3184 Mild cognitive impairment, so stated: Secondary | ICD-10-CM | POA: Diagnosis not present

## 2023-10-15 DIAGNOSIS — R519 Headache, unspecified: Secondary | ICD-10-CM | POA: Diagnosis not present

## 2023-10-15 DIAGNOSIS — M542 Cervicalgia: Secondary | ICD-10-CM | POA: Diagnosis not present

## 2023-10-15 DIAGNOSIS — F1010 Alcohol abuse, uncomplicated: Secondary | ICD-10-CM | POA: Diagnosis not present

## 2023-10-18 DIAGNOSIS — M7581 Other shoulder lesions, right shoulder: Secondary | ICD-10-CM | POA: Diagnosis not present

## 2023-10-18 DIAGNOSIS — G8929 Other chronic pain: Secondary | ICD-10-CM | POA: Diagnosis not present

## 2023-10-18 DIAGNOSIS — M6281 Muscle weakness (generalized): Secondary | ICD-10-CM | POA: Diagnosis not present

## 2023-10-18 DIAGNOSIS — M25611 Stiffness of right shoulder, not elsewhere classified: Secondary | ICD-10-CM | POA: Diagnosis not present

## 2023-10-18 DIAGNOSIS — M25511 Pain in right shoulder: Secondary | ICD-10-CM | POA: Diagnosis not present

## 2023-10-18 DIAGNOSIS — M75121 Complete rotator cuff tear or rupture of right shoulder, not specified as traumatic: Secondary | ICD-10-CM | POA: Diagnosis not present

## 2023-10-23 ENCOUNTER — Encounter (INDEPENDENT_AMBULATORY_CARE_PROVIDER_SITE_OTHER): Payer: Self-pay

## 2023-10-25 DIAGNOSIS — M25611 Stiffness of right shoulder, not elsewhere classified: Secondary | ICD-10-CM | POA: Diagnosis not present

## 2023-10-25 DIAGNOSIS — M7581 Other shoulder lesions, right shoulder: Secondary | ICD-10-CM | POA: Diagnosis not present

## 2023-10-25 DIAGNOSIS — M25511 Pain in right shoulder: Secondary | ICD-10-CM | POA: Diagnosis not present

## 2023-10-25 DIAGNOSIS — M75121 Complete rotator cuff tear or rupture of right shoulder, not specified as traumatic: Secondary | ICD-10-CM | POA: Diagnosis not present

## 2023-10-25 DIAGNOSIS — M6281 Muscle weakness (generalized): Secondary | ICD-10-CM | POA: Diagnosis not present

## 2023-10-25 DIAGNOSIS — G8929 Other chronic pain: Secondary | ICD-10-CM | POA: Diagnosis not present

## 2023-10-29 ENCOUNTER — Other Ambulatory Visit (HOSPITAL_COMMUNITY): Payer: Self-pay

## 2023-10-30 ENCOUNTER — Other Ambulatory Visit (HOSPITAL_COMMUNITY): Payer: Self-pay

## 2023-10-31 ENCOUNTER — Other Ambulatory Visit (INDEPENDENT_AMBULATORY_CARE_PROVIDER_SITE_OTHER): Payer: Self-pay | Admitting: Vascular Surgery

## 2023-10-31 DIAGNOSIS — M79669 Pain in unspecified lower leg: Secondary | ICD-10-CM

## 2023-11-02 ENCOUNTER — Ambulatory Visit (INDEPENDENT_AMBULATORY_CARE_PROVIDER_SITE_OTHER)

## 2023-11-02 ENCOUNTER — Encounter (INDEPENDENT_AMBULATORY_CARE_PROVIDER_SITE_OTHER)

## 2023-11-02 ENCOUNTER — Ambulatory Visit (INDEPENDENT_AMBULATORY_CARE_PROVIDER_SITE_OTHER): Admitting: Vascular Surgery

## 2023-11-02 ENCOUNTER — Encounter (INDEPENDENT_AMBULATORY_CARE_PROVIDER_SITE_OTHER): Payer: Self-pay

## 2023-11-02 ENCOUNTER — Encounter (INDEPENDENT_AMBULATORY_CARE_PROVIDER_SITE_OTHER): Admitting: Vascular Surgery

## 2023-11-02 DIAGNOSIS — M7989 Other specified soft tissue disorders: Secondary | ICD-10-CM

## 2023-11-02 DIAGNOSIS — M79669 Pain in unspecified lower leg: Secondary | ICD-10-CM | POA: Diagnosis not present

## 2023-11-10 DIAGNOSIS — G4733 Obstructive sleep apnea (adult) (pediatric): Secondary | ICD-10-CM | POA: Diagnosis not present

## 2023-11-12 DIAGNOSIS — K219 Gastro-esophageal reflux disease without esophagitis: Secondary | ICD-10-CM | POA: Diagnosis not present

## 2023-11-12 DIAGNOSIS — G4709 Other insomnia: Secondary | ICD-10-CM | POA: Diagnosis not present

## 2023-11-17 ENCOUNTER — Other Ambulatory Visit (HOSPITAL_COMMUNITY): Payer: Self-pay

## 2023-11-19 ENCOUNTER — Other Ambulatory Visit (HOSPITAL_COMMUNITY): Payer: Self-pay

## 2023-11-19 ENCOUNTER — Ambulatory Visit: Attending: Cardiology

## 2023-11-19 ENCOUNTER — Other Ambulatory Visit: Payer: Self-pay

## 2023-11-19 DIAGNOSIS — I4819 Other persistent atrial fibrillation: Secondary | ICD-10-CM

## 2023-11-19 LAB — ECHOCARDIOGRAM COMPLETE
AR max vel: 1.4 cm2
AV Area VTI: 1.65 cm2
AV Area mean vel: 1.47 cm2
AV Mean grad: 9 mmHg
AV Peak grad: 17.3 mmHg
Ao pk vel: 2.08 m/s
S' Lateral: 2.24 cm

## 2023-11-19 MED ORDER — LEVOTHYROXINE SODIUM 150 MCG PO TABS
150.0000 ug | ORAL_TABLET | Freq: Every day | ORAL | 3 refills | Status: AC
  Filled 2023-11-19: qty 90, 90d supply, fill #0
  Filled 2024-03-12: qty 90, 90d supply, fill #1
  Filled 2024-05-19: qty 90, 90d supply, fill #2

## 2023-11-19 MED ORDER — APIXABAN 5 MG PO TABS
5.0000 mg | ORAL_TABLET | Freq: Two times a day (BID) | ORAL | 1 refills | Status: DC
  Filled 2023-11-19: qty 180, 90d supply, fill #0
  Filled 2024-02-17: qty 180, 90d supply, fill #1

## 2023-11-20 ENCOUNTER — Ambulatory Visit: Payer: Self-pay | Admitting: Cardiology

## 2023-11-21 ENCOUNTER — Ambulatory Visit: Admitting: Cardiology

## 2023-11-22 ENCOUNTER — Encounter (INDEPENDENT_AMBULATORY_CARE_PROVIDER_SITE_OTHER): Payer: Self-pay | Admitting: Vascular Surgery

## 2023-11-22 NOTE — Progress Notes (Signed)
 Patient left without being seen.

## 2023-11-27 NOTE — Progress Notes (Unsigned)
 Electrophysiology Office Note:    Date:  11/28/2023   ID:  Warren Ingram, DOB Oct 17, 1940, MRN [id number]  [facility] Cardiologist:  None  [facility] Electrophysiologist:  OLE ONEIDA HOLTS, MD   Referring MD: Darliss Rogue, MD   Chief Complaint: Atrial fibrillation  History of Present Illness:    Warren Ingram is an 83 year old man who I am seeing today for evaluation of atrial fibrillation at the request of Dr. Darliss.  The patient last saw Dr. Darliss in clinic October 01, 2023.  He has a history of coronary artery disease with a prior CABG in 2019, atrial fibrillation, carotid stenosis with prior carotid stent in 2023 and TIA.  Uses Eliquis  for stroke prophylaxis.  He also has hypertension, hypothyroidism and diabetes.  At the last appointment with Dr. Darliss, a cardioversion was planned.  He underwent a cardioversion on Oct 12, 2023.  The patient and his wife confirm that he felt better after the cardioversion for 1 to 2 weeks.  His thinking was clear and he was moving faster with better breathing.  He then returned to his prior state of health and has had trouble with his thinking and mobility.      Their past medical, social and family history was reviewed.   ROS:   Please see the history of present illness.    All other systems reviewed and are negative.  EKGs/Labs/Other Studies Reviewed:    The following studies were reviewed today:  November 19, 2023 echo EF 55-60 RV normal Mild to moderately dilated left atrium Severely dilated right atrium Mild MR Mild to moderate TR  mild aortic stenosis  Oct 12, 2023 EKG shows sinus rhythm, right bundle branch block, PVC  June 13, 2023 EKG shows atrial fibrillation/atrial flutter, right bundle branch block  April 05, 2022 EKG showed atrial fibrillation  EKG Interpretation Date/Time:  Wednesday November 28 2023 09:42:54 EDT Ventricular Rate:  72 PR Interval:    QRS Duration:  138 QT  Interval:  422 QTC Calculation: 462 R Axis:   265  Text Interpretation: Atrial fibrillation Right bundle branch block Confirmed by HOLTS OLE [phone]) on 11/28/2023 9:50:06 AM    Physical Exam:    VS:  BP 115/67   Pulse 72   Ht 5' 6 (1.676 m)   Wt 182 lb 6.4 oz (82.7 kg)   SpO2 95%   BMI 29.44 kg/m     Wt Readings from Last 3 Encounters:  11/28/23 182 lb 6.4 oz (82.7 kg)  10/12/23 175 lb (79.4 kg)  10/11/23 181 lb 3.2 oz (82.2 kg)     GEN: no distress.  Elderly CARD: Irregularly irregular, No MRG RESP: No IWOB. CTAB.        ASSESSMENT AND PLAN:    1. Persistent atrial fibrillation (HCC)   2. S/P CABG x 4     #Atrial fibrillation and flutter On Eliquis  for stroke prophylaxis.  Is a longstanding issue, present since at least 2023.  Recent cardioversion May 9 with ERAF.  He is well rate controlled.  He is symptomatic.  I have recommended rhythm control.  We discussed amiodarone and Tikosyn.  He is not interested in the hospitalization required for Tikosyn loading.  I discussed amiodarone including its risks and he wishes to proceed.  We will start with 200 mg by mouth twice daily for 14 days followed by 200 mg by mouth daily thereafter.  Plan for cardioversion in 6 to 8 weeks and follow-up with an EP APP in  3 months.  If he maintains normal rhythm on amiodarone and has not improved exercise tolerance can consider catheter ablation in the future.  I discussed the cardioversion procedure in detail today including the risks and he wishes to proceed.  He has not missed any doses of anticoagulation.  #Coronary artery disease No ischemic symptoms today Continue Plavix  and statin  #Sleep apnea Currently not treated.  I encouraged him to follow-up with the sleep specialist about starting CPAP.      Signed, Ole DASEN. Cindie, MD, [facility] 11/28/2023 10:04 AM    Electrophysiology [facility]

## 2023-11-28 ENCOUNTER — Ambulatory Visit (INDEPENDENT_AMBULATORY_CARE_PROVIDER_SITE_OTHER): Admitting: Vascular Surgery

## 2023-11-28 ENCOUNTER — Ambulatory Visit: Attending: Cardiology | Admitting: Cardiology

## 2023-11-28 ENCOUNTER — Encounter (INDEPENDENT_AMBULATORY_CARE_PROVIDER_SITE_OTHER)

## 2023-11-28 ENCOUNTER — Other Ambulatory Visit: Payer: Self-pay

## 2023-11-28 VITALS — BP 115/67 | HR 72 | Ht 66.0 in | Wt 182.4 lb

## 2023-11-28 DIAGNOSIS — I4819 Other persistent atrial fibrillation: Secondary | ICD-10-CM | POA: Diagnosis not present

## 2023-11-28 DIAGNOSIS — Z951 Presence of aortocoronary bypass graft: Secondary | ICD-10-CM

## 2023-11-28 MED ORDER — AMIODARONE HCL 200 MG PO TABS: ORAL_TABLET | ORAL | 3 refills | Status: DC

## 2023-11-28 NOTE — Patient Instructions (Addendum)
 Medication Instructions:  Your physician has recommended you make the following change in your medication:  1) START Amiodarone  - take 1 tablet (200 mg total) TWICE a day for 2 weeks, then  - take 1 tablet (200 mg total) ONCE a day  *If you need a refill on your cardiac medications before your next appointment, please call your pharmacy*  Lab Work: Your provider would like for you to return in 6 weeks to have the following labs drawn: CMET, TSH, T4.   Please go to [facility] [address] ([facility] [address], [place] [address] You do not need an appointment.  They are open from 8 am- 4:30 pm.  Lunch from 1:00 pm- 2:00 pm You do NOT need to be fasting.  Testing/Procedures: Cardioversion  Your physician has recommended that you have a Cardioversion (DCCV). Electrical Cardioversion uses a jolt of electricity to your heart either through paddles or wired patches attached to your chest. This is a controlled, usually prescheduled, procedure. Defibrillation is done under light anesthesia in the hospital, and you usually go home the day of the procedure. This is done to get your heart back into a normal rhythm. You are not awake for the procedure. Please see the instruction sheet given to you today.   Follow-Up: At [facility], you and your health needs are our priority.  As part of our continuing mission to provide you with exceptional heart care, our providers are all part of one team.  This team includes your primary Cardiologist (physician) and Advanced Practice Providers or APPs (Physician Assistants and Nurse Practitioners) who all work together to provide you with the care you need, when you need it.  Your next appointment:   3 months  Provider:   Suzann Riddle, NP

## 2023-12-04 ENCOUNTER — Other Ambulatory Visit: Payer: Self-pay

## 2023-12-04 ENCOUNTER — Ambulatory Visit: Admitting: Cardiology

## 2023-12-05 DIAGNOSIS — G4733 Obstructive sleep apnea (adult) (pediatric): Secondary | ICD-10-CM | POA: Diagnosis not present

## 2023-12-05 DIAGNOSIS — N5089 Other specified disorders of the male genital organs: Secondary | ICD-10-CM | POA: Diagnosis not present

## 2023-12-05 DIAGNOSIS — I480 Paroxysmal atrial fibrillation: Secondary | ICD-10-CM | POA: Diagnosis not present

## 2023-12-05 DIAGNOSIS — I2609 Other pulmonary embolism with acute cor pulmonale: Secondary | ICD-10-CM | POA: Diagnosis not present

## 2023-12-05 NOTE — Progress Notes (Signed)
 No chief complaint on file.   HPI  Warren Ingram is a 83 y.o. here for an acute issue.  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  He does have shortness of breath.  Weight is about the same in the last month.  He has chronic lower extremity swelling as well.  Followed by cardiology, pulmonology, vascular.  Echo 2 weeks ago showing normal EF with moderate TR and severely dilated right atrium.  Currently taking 20 mg of torsemide daily.  He does get good results..    ROS  Pertinent items are noted in HPI.  Outpatient Encounter Medications as of 12/05/2023  Medication Sig Dispense Refill  . acetaminophen  (TYLENOL ) 325 MG tablet Take 500 mg by mouth as needed for Pain    . apixaban  (ELIQUIS ) 5 mg tablet Take 1 tablet (5 mg total) by mouth every 12 (twelve) hours. 180 tablet 1  . atorvastatin  (LIPITOR ) 80 MG tablet take 1 tablet every day 90 tablet 3  . bumetanide (BUMEX) 2 MG tablet Take 1 tablet (2 mg total) by mouth once daily 30 tablet 11  . clobetasoL (TEMOVATE) 0.05 % ointment Apply 0.05 % topically once daily as needed    . clopidogreL  (PLAVIX ) 75 mg tablet Take 1 tablet (75 mg total) by mouth once daily 90 tablet 3  . fenofibrate  nanocrystallized (TRICOR ) 145 MG tablet TAKE 1 TABLET ONE TIME DAILY 90 tablet 3  . gabapentin (NEURONTIN) 300 MG capsule Take 1 capsule (300 mg total) by mouth at bedtime for 90 days 90 capsule 0  . levocetirizine (XYZAL) 5 MG tablet Take 1 tablet (5 mg total) by mouth every evening 30 tablet 11  . levothyroxine  (SYNTHROID ) 150 MCG tablet Take 1 tablet (150 mcg total) by mouth daily on an empty stomach with a glass of water  at least 30-60 minutes before breakfast. 90 tablet 3  . lisinopriL  (ZESTRIL ) 20 MG tablet Take 1 tablet (20 mg total) by mouth once daily 30 tablet 11  . metoprolol  SUCCinate  (TOPROL -XL) 100 MG XL tablet Take 1 tablet (100 mg total) by mouth once daily 30 tablet 11  . omega-3 fatty acids/fish oil 340-1,000 mg capsule Take 2 capsules by mouth 2 (two) times daily 1200 mg 2x daily    . omeprazole  (PRILOSEC) 20 MG DR capsule TAKE 1 CAPSULE EVERY DAY 90 capsule 3  . silver sulfADIAZINE (SSD) 1 % cream     . tamsulosin  (FLOMAX ) 0.4 mg capsule TAKE 1 CAPSULE EVERY DAY 90 capsule 3  . TORsemide (DEMADEX) 20 MG tablet Take 1 tablet (20 mg total) by mouth once daily 30 tablet 11  . triamcinolone  0.5 % cream Apply topically 2 (two) times daily 454 g 3   No facility-administered encounter medications on file as of 12/05/2023.    Allergies as of 12/05/2023  . (No Known Allergies)    Past Medical History:  Diagnosis Date  . Barrett's esophagus 03/10/2010   06/25/2014 long segment  . Benign neoplasm of colon 08/21/2013  . Coronary atherosclerosis of native coronary artery 08/21/2013  . Diverticulitis of colon (without mention of hemorrhage)(562.11) 08/21/2013  . Esophageal reflux 08/21/2013  . Essential hypertension, benign 08/21/2013  . Heart murmur   . Hemorrhage of rectum and anus 08/21/2013  . History of stroke   . Osteoarthrosis, unspecified whether generalized or localized, lower leg 08/21/2013  .  Osteoarthrosis, unspecified whether generalized or localized, pelvic region and thigh 08/21/2013  . Personal history of colonic polyps 2007, 2011   +TA  . Pure hypercholesterolemia 08/21/2013  . Unspecified hypothyroidism 08/21/2013    Past Surgical History:  Procedure Laterality Date  . Right unicondylar knee arthroplasty. Right [birth year]1941   Dr. Edie  . FLEXIBLE SIGMOIDOSCOPY  04/30/1996   Diverticulosis  . COLONOSCOPY  06/25/2014   Tubular Adenoma/Repeat 32yrs/PYO  . EGD  06/25/2014   Long Segment Barrett's/PYO  . EGD with BARRX  10/28/2015   Barrett's esophagus treated with BARRX/Repeat BARRX in 2 to 3 months/RTE  . CORONARY ARTERY BYPASS GRAFT  08/06/2017  . JOINT  REPLACEMENT  11/19/2018  . Left TKA using all cemented biomet V[employer]ard system with a 70 mm PCR femur, a 75 mm tibial tray with a 10 mm E-poly insert and a 34x 8.5 mm all-poly 3 pegged domed patella Left 12/19/2018   Dr.Poggi   . ARTHROSCOPY KNEE Left   . COLONOSCOPY  12/04/2005,03/10/2010   +TA  . KNEE ARTHROSCOPY    . STENT PLACEMENT INTRACRANIAL PERCUTANEOUS    . upper endoscopy   12/04/2005, 03/10/2010   + Barrett's  . VASECTOMY    . VASECTOMY      Vitals:   12/05/23 1022  BP: 118/80  Pulse: 64    Physical Exam  General. Well appearing; NAD; VS reviewed     HEENT: Sclera and conjunctiva clear; EOMI,  Lungs. Respirations unlabored; clear to auscultation bilaterally. Cardiovascular. Heart regular rate and rhythm with systolic murmur. Abdomen:  Soft, non tender.  Normoactive bowel sounds.   Extremities:  1+ edema. Skin. Normal color and turgor Neurologic. Alert and oriented x3   Assessment and Plan 1. Scrotal swelling Plan to increase torsemide to 40 mg a day.  Close follow-up early next week.  Try to elevate scrotum.  2. Paroxysmal atrial fibrillation (CMS/HHS-HCC) Recent cardioversion.  Currently in regular rhythm.  3. Cor pulmonale, acute (CMS/HHS-HCC) Encouraged compression hose.  4. OSA (obstructive sleep apnea) Severe.  Plans to try CPAP.    I have personally performed this service.  [address] [place], [place]

## 2023-12-10 NOTE — Progress Notes (Unsigned)
   Cardiology Clinic Note   Date: 12/10/2023 Warren Ingram, DOB September 18, 1940, MRN [id number]  Primary Cardiologist:  None  Chief Complaint   Warren Ingram is a 83 y.o. male who presents to the clinic today for ***  Patient Profile   Warren Ingram is followed by *** for the history outlined below.      Past medical history significant for: ***  In summary, ***  Patient was last seen in the office by ***     History of Present Illness    Today, patient ***  ***  ROS: All other systems reviewed and are otherwise negative except as noted in History of Present Illness.  EKGs/Labs Reviewed        10/01/2023: BUN 16; Creatinine, Ser 0.87; Potassium 4.1; Sodium 144   10/01/2023: Hemoglobin 13.9; WBC 6.6   No results found for requested labs within last 365 days.   No results found for requested labs within last 365 days.  ***  Risk Assessment/Calculations    {Does this patient have ATRIAL FIBRILLATION?:[phone]} No BP recorded.  {Refresh Note OR Click here to enter BP  :1}***        Physical Exam    VS:  There were no vitals taken for this visit. , BMI There is no height or weight on file to calculate BMI.  GEN: Well nourished, well developed, in no acute distress. Neck: No JVD or carotid bruits. Cardiac: *** RRR. *** No murmur. No rubs or gallops.   Respiratory:  Respirations regular and unlabored. Clear to auscultation without rales, wheezing or rhonchi. GI: Soft, nontender, nondistended. Extremities: Radials/DP/PT 2+ and equal bilaterally. No clubbing or cyanosis. No edema ***  Skin: Warm and dry, no rash. Neuro: Strength intact.  Assessment & Plan   ***  Disposition: ***     {Are you ordering a CV Procedure (e.g. stress test, cath, DCCV, TEE, etc)?   Press F2        :[id number]}   Signed, Barnie HERO. Natilie Krabbenhoft, DNP, NP-C

## 2023-12-13 ENCOUNTER — Ambulatory Visit: Attending: Student | Admitting: Student

## 2023-12-13 ENCOUNTER — Encounter: Payer: Self-pay | Admitting: Student

## 2023-12-13 VITALS — BP 136/78 | HR 44 | Ht 65.0 in | Wt 188.2 lb

## 2023-12-13 DIAGNOSIS — I272 Pulmonary hypertension, unspecified: Secondary | ICD-10-CM

## 2023-12-13 DIAGNOSIS — Z79899 Other long term (current) drug therapy: Secondary | ICD-10-CM

## 2023-12-13 DIAGNOSIS — I10 Essential (primary) hypertension: Secondary | ICD-10-CM

## 2023-12-13 DIAGNOSIS — R600 Localized edema: Secondary | ICD-10-CM

## 2023-12-13 DIAGNOSIS — I480 Paroxysmal atrial fibrillation: Secondary | ICD-10-CM

## 2023-12-13 DIAGNOSIS — I5032 Chronic diastolic (congestive) heart failure: Secondary | ICD-10-CM

## 2023-12-13 DIAGNOSIS — I350 Nonrheumatic aortic (valve) stenosis: Secondary | ICD-10-CM | POA: Diagnosis not present

## 2023-12-13 DIAGNOSIS — E785 Hyperlipidemia, unspecified: Secondary | ICD-10-CM

## 2023-12-13 DIAGNOSIS — I6521 Occlusion and stenosis of right carotid artery: Secondary | ICD-10-CM

## 2023-12-13 MED ORDER — METOPROLOL SUCCINATE ER 50 MG PO TB24: 50.0000 mg | ORAL_TABLET | Freq: Every day | ORAL | 3 refills | Status: AC

## 2023-12-13 NOTE — Patient Instructions (Addendum)
 Medication Instructions:   Your physician recommends the following medication changes.  DECREASE: Metoprolol  Succinate (Toprol -XL) from 100 mg to 50 mg by mouth daily  Take all other medications as prescribed.  Call your Insurance Provider to determine which is more cost effective between Jardiance and Farxiga. Call our office or my chart message to let us  know which one Insurance covers and an order will be placed.  *If you need a refill on your cardiac medications before your next appointment, please call your pharmacy*  Lab Work:  Your provider would like for you to have following labs drawn today BMET.    If you have labs (blood work) drawn today and your tests are completely normal, you will receive your results only by: MyChart Message (if you have MyChart) OR A paper copy in the mail If you have any lab test that is abnormal or we need to change your treatment, we will call you to review the results.  Testing/Procedures:  No test ordered today   Follow-Up: At [facility], you and your health needs are our priority.  As part of our continuing mission to provide you with exceptional heart care, our providers are all part of one team.  This team includes your primary Cardiologist (physician) and Advanced Practice Providers or APPs (Physician Assistants and Nurse Practitioners) who all work together to provide you with the care you need, when you need it.  Your next appointment:    1 month(s)  Provider:   You may see Redell Cave, MD or one of the following Advanced Practice Providers on your designated Care Team:    Barnie Hila, NP

## 2023-12-14 ENCOUNTER — Ambulatory Visit: Payer: Self-pay | Admitting: Student

## 2023-12-14 LAB — BASIC METABOLIC PANEL WITH GFR
BUN/Creatinine Ratio: 24 (ref 10–24)
BUN: 44 mg/dL — ABNORMAL HIGH (ref 8–27)
CO2: 23 mmol/L (ref 20–29)
Calcium: 9.1 mg/dL (ref 8.6–10.2)
Chloride: 100 mmol/L (ref 96–106)
Creatinine, Ser: 1.82 mg/dL — ABNORMAL HIGH (ref 0.76–1.27)
Glucose: 105 mg/dL — ABNORMAL HIGH (ref 70–99)
Potassium: 3.9 mmol/L (ref 3.5–5.2)
Sodium: 142 mmol/L (ref 134–144)
eGFR: 37 mL/min/1.73 — ABNORMAL LOW (ref >59)

## 2023-12-14 NOTE — Telephone Encounter (Signed)
 Wife Warren Ingram) returned call to D. Wittenborn, NP and reports her pharmacy - [facility], phone# [phone] will need a prescription for Farxiga  or Jardiance before they will give her a price to determine the least expensive.

## 2023-12-17 ENCOUNTER — Telehealth: Payer: Self-pay | Admitting: Student

## 2023-12-17 DIAGNOSIS — Z79899 Other long term (current) drug therapy: Secondary | ICD-10-CM

## 2023-12-17 NOTE — Progress Notes (Signed)
Called and left voicemail for the patient to call back.

## 2023-12-17 NOTE — Telephone Encounter (Signed)
 Pt wife called and stated she checked with ins and both med are the same price Jardiance 30 days , 10 mg or 25 mg.  It is $47 Farxiga  30 Day,  10 mg, 5 mg  at $47 also

## 2023-12-17 NOTE — Telephone Encounter (Signed)
 Pt's wife returning call regarding test results. Please advise

## 2023-12-17 NOTE — Telephone Encounter (Signed)
 Patient is hard of hearing and wife is out at this moment. Caregiver on phone requested that I call back later.      Barnie Hila, NP 12/14/2023  7:33 AM EDT Back to Top    Please let patient know kidney function is low. He will need to decrease torsemide to 20 mg daily and recheck BMP in 1 week. Will you please ask patient if he inquired with insurance if Farxiga  or Jardiance was better covered?    Thank you!   DW

## 2023-12-17 NOTE — Telephone Encounter (Signed)
 Called and spoke with the patient's wife, per DPR.  Informed them of the most recent lab results as interpreted by Barnie Hila, NP and the recommendation to reduce Torsemide to 20 mg once daily and come in for a BMP lab draw in about a week. Also asked to speak to their insurance company to find out which would be more cost effective for them, Farxiga  or Jardiance.  Wife stated that she called the pharmacy, and they were not able to help, and she will call the insurance company with the query and will let us  know what they say.  Order for BMP placed.

## 2023-12-18 ENCOUNTER — Other Ambulatory Visit (HOSPITAL_COMMUNITY): Payer: Self-pay

## 2023-12-18 DIAGNOSIS — G4733 Obstructive sleep apnea (adult) (pediatric): Secondary | ICD-10-CM | POA: Diagnosis not present

## 2023-12-18 MED ORDER — DAPAGLIFLOZIN PROPANEDIOL 10 MG PO TABS
10.0000 mg | ORAL_TABLET | Freq: Every day | ORAL | 11 refills | Status: DC
  Filled 2023-12-18 – 2023-12-19 (×2): qty 30, 30d supply, fill #0
  Filled 2024-01-16: qty 30, 30d supply, fill #1
  Filled 2024-02-17: qty 30, 30d supply, fill #2
  Filled 2024-03-12: qty 30, 30d supply, fill #3
  Filled 2024-04-14: qty 30, 30d supply, fill #4
  Filled 2024-05-19: qty 30, 30d supply, fill #5

## 2023-12-18 NOTE — Addendum Note (Signed)
 Addended by: TOBIE HOUSTON A on: 12/18/2023 10:19 AM   Modules accepted: Orders

## 2023-12-18 NOTE — Telephone Encounter (Signed)
 Called and spoke with the patient's wife (Per DPR).  Informed her that Barnie Hila, NP has recommended Farxiga  10 mg once daily.  Prescription sent to [facility].  Rock, patient's wife, verbalized understanding and expressed gratitude for the call.  All questions and concerns addressed.

## 2023-12-19 ENCOUNTER — Other Ambulatory Visit: Payer: Self-pay

## 2023-12-19 ENCOUNTER — Other Ambulatory Visit (HOSPITAL_COMMUNITY): Payer: Self-pay

## 2023-12-26 ENCOUNTER — Ambulatory Visit: Admitting: Urology

## 2023-12-26 ENCOUNTER — Other Ambulatory Visit
Admission: RE | Admit: 2023-12-26 | Discharge: 2023-12-26 | Disposition: A | Discharge status: Home / Self Care | Attending: Urology | Admitting: Urology

## 2023-12-26 VITALS — BP 154/81 | HR 71 | Ht 65.0 in | Wt 180.0 lb

## 2023-12-26 DIAGNOSIS — N5089 Other specified disorders of the male genital organs: Secondary | ICD-10-CM | POA: Diagnosis not present

## 2023-12-26 DIAGNOSIS — R39198 Other difficulties with micturition: Secondary | ICD-10-CM

## 2023-12-26 LAB — URINALYSIS, COMPLETE (UACMP) WITH MICROSCOPIC
Bilirubin Urine: NEGATIVE
Glucose, UA: >=500 mg/dL — AB
Hgb urine dipstick: NEGATIVE
Ketones, ur: NEGATIVE mg/dL
Leukocytes,Ua: NEGATIVE
Nitrite: NEGATIVE
Specific Gravity, Urine: 1.01 (ref 1.005–1.030)
pH: 6 (ref 5.0–8.0)

## 2023-12-26 LAB — BLADDER SCAN AMB NON-IMAGING: Scan Result: 165

## 2023-12-26 NOTE — Progress Notes (Signed)
 12/26/23 10:35 AM   Warren Ingram Sep 29, 1940 [id number]  CC: Scrotal and penile swelling  HPI: 83 year old male with a number of comorbidities including history of stroke, cardiac disease, lower extremity edema, here with his wife today who provides most of the history.  Since March 2025 he reports scrotal and penile swelling that has been bulky and uncomfortable, and cause difficulty with urination.  He has been on Flomax  long-term for urinary symptoms.  Prior history of urinary retention after surgery.  He denies any dysuria.  He has been on high-dose diuretics which has been improved his lower extremity swelling but has persistent scrotal swelling.  He is also on amiodarone .   PMH: Past Medical History:  Diagnosis Date   Aortic stenosis 07/26/2019   a.) TTE Bubble study 07/26/2019: mild AS (MPG 13.1); b.) TTE 05/16/2022: mod AS (MPG 12.5; AVA 0.89); c.) TTE 01/09/2023: mild AS (MPG 18; AVA 0.90)   Arthritis    Atrial fibrillation and flutter (HCC)    a.) CHA2DS2-VASc = 5 (age x2, HTN, CVA x2) as of 06/14/2023; b.) cardiac rate/rhythm maintained on oral metoprolol  tartrate; chronically anticoagulated using apixaban    Barrett esophagus    Benign neoplasm of abdomen    Bilateral carotid artery disease (HCC)    a.) doppler 07/21/2013: <50 % BICA; b.) 10/24/2021 --> radiographic string sign LICA --> PTA with stenting (10 x 8 x 40 Exact stent; c.) doppler 12/16/2021, 03/13/2022, 09/11/2022: 40-59% RICA, 1-39% LICA   Coronary artery disease 03/30/2009   a.) LHC/PCI 03/30/2009: 75% mLCX (PTCA), 95% OM1 (2.75 x 15 mm Xience V DES); b.) LHC 07/25/2017: 100% pRCA, 95% p-mLAD, 10% oOM2, 90% lat OM2, 90% m-dLCx --> CVTS consult; c.) s/p 4v CABG   Diastolic dysfunction 07/26/2019   a.) bubble study 07/26/2019: EF 55-60%, no RWMAs, G1DD, triv TR; b.) TTE 07/23/2016: EF >55%, LVH, mild LAE, AoV sclerosis, mildly elevated PASP, G1DD; c.) TTE 05/16/2022: EF >55%, mod LVH, mod BAE, mod RVE, mod  AS), triv MR, mod TR, mild PR; d.) TTE 01/09/2023: EF >55%, LVH, mild RVE, sev RAE, triv AR, mild AS, mild MR, sev TR (RVSP 39), triv PR   Diverticulitis    GERD (gastroesophageal reflux disease)    Headache    Heart murmur    History of hiatal hernia    Hypercholesterolemia    Hypertension    Hypothyroidism    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   a.) MRI brain 07/25/2019: acute lateral pontine infarct measuring 10 mm   Long term current use of clopidogrel     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   a.) MRI brain 07/25/2019: remote lacunar infact of the RIGHT lentiform nucleus and BILATERAL thalami   Nontraumatic complete tear of rotator cuff, right    On apixaban  therapy    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   a.) LIMA-LAD, SVG-PDA, SVG-D1, SVG-OM1; b.) POD1 patient with STE in V2-V3 --> Tx'd with colchicine for pericarditis   Skin cancer    T2DM (type 2 diabetes mellitus) (HCC)    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   a.) MRI brain 04/05/2022: small acute LEFT thalamic infarct   TIA (transient ischemic attack) 2015   Tubular adenoma     Surgical History: Past Surgical History:  Procedure Laterality Date   CARDIOVERSION N/A 10/12/2023   Procedure: CARDIOVERSION;  Surgeon: Darliss Rogue, MD;  Location: [facility] ORS;  Service: Cardiovascular;  Laterality: N/A;   CAROTID PTA/STENT INTERVENTION Left 11/08/2021   Procedure: CAROTID PTA/STENT INTERVENTION;  Surgeon: Jama,  Cordella MATSU, MD;  Location: [facility];  Service: Cardiovascular;  Laterality: Left;   COLONOSCOPY WITH PROPOFOL  N/A 01/01/2018   Procedure: COLONOSCOPY WITH PROPOFOL ;  Surgeon: Therisa Bi, MD;  Location: [facility];  Service: Gastroenterology;  Laterality: N/A;   CORONARY ANGIOPLASTY WITH STENT PLACEMENT Left 03/30/2009   Procedure: CORONARY ANGIOPLASTY WITH STENT PLACEMENT; Location: [facility]; Surgeons: Vinie Jude, MD (diagnostic) and Marsa Dooms, MD (interventional)   CORONARY ARTERY BYPASS GRAFT   08/06/2017   4 vessels   ESOPHAGOGASTRODUODENOSCOPY (EGD) WITH PROPOFOL  N/A 10/28/2015   Procedure: ESOPHAGOGASTRODUODENOSCOPY (EGD) WITH PROPOFOL ;  Surgeon: Deward CINDERELLA Piedmont, MD;  Location: [facility] ENDOSCOPY;  Service: Gastroenterology;  Laterality: N/A;   KNEE ARTHROSCOPY     LEFT HEART CATH AND CORONARY ANGIOGRAPHY N/A 07/25/2017   Procedure: LEFT HEART CATH AND CORONARY ANGIOGRAPHY;  Surgeon: Jude Vinie LABOR, MD;  Location: [facility];  Service: Cardiovascular;  Laterality: N/A;   NASAL SINUS SURGERY     PARTIAL KNEE ARTHROPLASTY Right 06/27/2019   Procedure: UNICOMPARTMENTAL KNEE;  Surgeon: Edie Norleen PARAS, MD;  Location: [facility];  Service: Orthopedics;  Laterality: Right;   SHOULDER ARTHROSCOPY WITH SUBACROMIAL DECOMPRESSION, ROTATOR CUFF REPAIR AND BICEP TENDON REPAIR Right 06/19/2023   Procedure: RIGHT SHOULDER ARTHROSCOPY WITH DEBRIDEMENT, DECOMPRESSION, ROTATOR CUFF REPAIR;  Surgeon: Edie Norleen PARAS, MD;  Location: [facility];  Service: Orthopedics;  Laterality: Right;   TOTAL KNEE ARTHROPLASTY Left 12/19/2018   Procedure: TOTAL KNEE ARTHROPLASTY;  Surgeon: Edie Norleen PARAS, MD;  Location: [facility];  Service: Orthopedics;  Laterality: Left;   VASECTOMY      Family History: Family History  Problem Relation Age of Onset   Heart attack Mother    Lung disease Father     Social History:  reports that he quit smoking about 52 years ago. His smoking use included cigarettes. He started smoking about 67 years ago. He has a 15 pack-year smoking history. He has quit using smokeless tobacco. He reports current alcohol use of about 2.0 standard drinks of alcohol per week. He reports that he does not use drugs.  Physical Exam: BP (!) 154/81   Pulse 71   Ht 5' 5 (1.651 m)   Wt 180 lb (81.6 kg)   BMI 29.95 kg/m    Constitutional:  Alert and oriented, No acute distress. Cardiovascular: No clubbing, cyanosis, or edema. Respiratory: Normal respiratory effort, no increased work of breathing. GI:  Abdomen is soft, nontender, nondistended, no abdominal masses GU: Diffuse scrotal and penile edema, difficult to visualize glans, nontender, thickened scrotal skin, no tenderness or erythema   Assessment & Plan:   83 year old male with scrotal swelling and skin thickening likely secondary to fluid overload.  Agree with diuretics, recommend trying to find alternative to amiodarone , as this could be contributing to his scrotal swelling.  Ultrasound ordered for confirmation, but low suspicion for hydrocele or other urologic pathology.  Behavioral strategies discussed, scrotal ultrasound ordered and will call with results Recommend trying to find alternative to amiodarone     Redell Burnet, MD 12/26/2023  [facility] [address], [address] [place], [place] [address] [phone]

## 2023-12-31 ENCOUNTER — Ambulatory Visit
Admission: RE | Admit: 2023-12-31 | Discharge: 2023-12-31 | Disposition: A | Discharge status: Home / Self Care | Source: Ambulatory Visit | Attending: Urology | Admitting: Urology

## 2023-12-31 DIAGNOSIS — I861 Scrotal varices: Secondary | ICD-10-CM | POA: Diagnosis not present

## 2023-12-31 DIAGNOSIS — N5089 Other specified disorders of the male genital organs: Secondary | ICD-10-CM | POA: Insufficient documentation

## 2024-01-02 DIAGNOSIS — Z8673 Personal history of transient ischemic attack (TIA), and cerebral infarction without residual deficits: Secondary | ICD-10-CM | POA: Diagnosis not present

## 2024-01-02 DIAGNOSIS — E7800 Pure hypercholesterolemia, unspecified: Secondary | ICD-10-CM | POA: Diagnosis not present

## 2024-01-02 DIAGNOSIS — I480 Paroxysmal atrial fibrillation: Secondary | ICD-10-CM | POA: Diagnosis not present

## 2024-01-02 DIAGNOSIS — I2510 Atherosclerotic heart disease of native coronary artery without angina pectoris: Secondary | ICD-10-CM | POA: Diagnosis not present

## 2024-01-02 DIAGNOSIS — I10 Essential (primary) hypertension: Secondary | ICD-10-CM | POA: Diagnosis not present

## 2024-01-02 DIAGNOSIS — E039 Hypothyroidism, unspecified: Secondary | ICD-10-CM | POA: Diagnosis not present

## 2024-01-02 DIAGNOSIS — R7303 Prediabetes: Secondary | ICD-10-CM | POA: Diagnosis not present

## 2024-01-02 DIAGNOSIS — Z79899 Other long term (current) drug therapy: Secondary | ICD-10-CM | POA: Diagnosis not present

## 2024-01-02 NOTE — Progress Notes (Signed)
 Warren Ingram is a  83 y.o. male who presents for  CHIEF COMPLAINT Chief Complaint  Patient presents with  . Follow-up  . Hypertension  . Hyperlipidemia  . Hypothyroidism  . prediabetes    Subjective: History of Present Illness  Pt in NAD. CAD/PAF followed by Cardiology. HTN stable on meds. Has HLD on statin, prediabetes not on meds and thyroid  dz on Synthroid . Also with hx of CVA. Weight stable. Scrotal swelling has improved. Sleeping OK. No fever. Denies CP or SOB. No palpitations. Some LE edema. No change in bowels or bladder.    Past Medical History:  Diagnosis Date  . Barrett's esophagus 03/10/2010   06/25/2014 long segment  . Benign neoplasm of colon 08/21/2013  . Coronary atherosclerosis of native coronary artery 08/21/2013  . Diverticulitis of colon (without mention of hemorrhage)(562.11) 08/21/2013  . Esophageal reflux 08/21/2013  . Essential hypertension, benign 08/21/2013  . Heart murmur   . Hemorrhage of rectum and anus 08/21/2013  . History of stroke   . Osteoarthrosis, unspecified whether generalized or localized, lower leg 08/21/2013  . Osteoarthrosis, unspecified whether generalized or localized, pelvic region and thigh 08/21/2013  . Personal history of colonic polyps 2007, 2011   +TA  . Pure hypercholesterolemia 08/21/2013  . Unspecified hypothyroidism 08/21/2013   Patient Active Problem List  Diagnosis  . Coronary atherosclerosis of native coronary artery  . Degenerative arthritis of right knee  . Benign neoplasm of colon  . Essential hypertension, benign  . Hemorrhage of rectum and anus  . Esophageal reflux  . Pure hypercholesterolemia  . Osteoarthrosis, unspecified whether generalized or localized, pelvic region and thigh  . Acquired hypothyroidism  . Diverticulitis of colon (without mention of hemorrhage)(562.11)  . Adenomatous colon polyp  . 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 Obesity (BMI  30-39.9), unspecified  . Barrett's esophagus  . Hematochezia  . Status post total knee replacement using cement, left  . Status post right partial knee replacement  . Acute CVA (cerebrovascular accident) (CMS/HHS-HCC)  . Lacunar stroke (CMS/HHS-HCC)  . Nontraumatic complete tear of left rotator cuff  . Rotator cuff tendinitis, left  . Injury of tendon of long head of left biceps  . Paroxysmal atrial fibrillation (CMS/HHS-HCC)  . Paresthesia  . Nonrheumatic aortic valve stenosis  . Nonrheumatic tricuspid valve regurgitation  . Rotator cuff tendinitis, right  . Nontraumatic complete tear of right rotator cuff  . Primary osteoarthritis of right shoulder  . Prediabetes  . Contusion of left knee  . History of CVA (cerebrovascular accident)    Past Surgical History:  Procedure Laterality Date  . Right unicondylar knee arthroplasty. Right 10[birth year]-1941   Dr. Edie  . FLEXIBLE SIGMOIDOSCOPY  04/30/1996   Diverticulosis  . COLONOSCOPY  06/25/2014   Tubular Adenoma/Repeat 41yrs/PYO  . EGD  06/25/2014   Long Segment Barrett's/PYO  . EGD with BARRX  10/28/2015   Barrett's esophagus treated with BARRX/Repeat BARRX in 2 to 3 months/RTE  . CORONARY ARTERY BYPASS GRAFT  08/06/2017  . JOINT REPLACEMENT  11/19/2018  . Left TKA using all cemented biomet [employer] system with a 70 mm PCR femur, a 75 mm tibial tray with a 10 mm E-poly insert and a 34x 8.5 mm all-poly 3 pegged domed patella Left 12/19/2018   Dr.Poggi   . ARTHROSCOPY KNEE Left   . COLONOSCOPY  12/04/2005,03/10/2010   +TA  . KNEE ARTHROSCOPY    . STENT PLACEMENT INTRACRANIAL PERCUTANEOUS    . upper  endoscopy   12/04/2005, 03/10/2010   + Barrett's  . VASECTOMY    . VASECTOMY       Current Outpatient Medications:  .  acetaminophen  (TYLENOL ) 325 MG tablet, Take 500 mg by mouth as needed for Pain, Disp: , Rfl:  .  apixaban  (ELIQUIS ) 5 mg tablet, Take 1 tablet (5 mg total) by mouth every 12 (twelve) hours., Disp: 180 tablet, Rfl: 1 .   atorvastatin  (LIPITOR ) 80 MG tablet, take 1 tablet every day, Disp: 90 tablet, Rfl: 3 .  clobetasoL (TEMOVATE) 0.05 % ointment, Apply 0.05 % topically once daily as needed, Disp: , Rfl:  .  clopidogreL  (PLAVIX ) 75 mg tablet, Take 1 tablet (75 mg total) by mouth once daily, Disp: 90 tablet, Rfl: 3 .  fenofibrate  nanocrystallized (TRICOR ) 145 MG tablet, TAKE 1 TABLET ONE TIME DAILY, Disp: 90 tablet, Rfl: 3 .  gabapentin (NEURONTIN) 300 MG capsule, TAKE 1 CAPSULE (300 MG TOTAL) BY MOUTH AT BEDTIME FOR 90 DAYS, Disp: 90 capsule, Rfl: 0 .  levocetirizine (XYZAL) 5 MG tablet, Take 1 tablet (5 mg total) by mouth every evening, Disp: 30 tablet, Rfl: 11 .  levothyroxine  (SYNTHROID ) 150 MCG tablet, Take 1 tablet (150 mcg total) by mouth daily on an empty stomach with a glass of water  at least 30-60 minutes before breakfast., Disp: 90 tablet, Rfl: 3 .  lisinopriL  (ZESTRIL ) 20 MG tablet, Take 1 tablet (20 mg total) by mouth once daily, Disp: 30 tablet, Rfl: 11 .  metoprolol  SUCCinate (TOPROL -XL) 100 MG XL tablet, Take 1 tablet (100 mg total) by mouth once daily, Disp: 30 tablet, Rfl: 11 .  omega-3 fatty acids/fish oil 340-1,000 mg capsule, Take 2 capsules by mouth 2 (two) times daily 1200 mg 2x daily, Disp: , Rfl:  .  omeprazole  (PRILOSEC) 20 MG DR capsule, TAKE 1 CAPSULE EVERY DAY, Disp: 90 capsule, Rfl: 3 .  silver sulfADIAZINE (SSD) 1 % cream, , Disp: , Rfl:  .  tamsulosin  (FLOMAX ) 0.4 mg capsule, TAKE 1 CAPSULE EVERY DAY, Disp: 90 capsule, Rfl: 3 .  TORsemide (DEMADEX) 20 MG tablet, Take 1 tablet (20 mg total) by mouth once daily, Disp: 30 tablet, Rfl: 11 .  triamcinolone  0.5 % cream, Apply topically 2 (two) times daily, Disp: 454 g, Rfl: 3  Patient has no known allergies.  Social History   Socioeconomic History  . Marital status: Married    Spouse name: Rock  . Number of children: 2  . Years of education: 12  Occupational History  . Occupation: Retired- Theatre stage manager  Tobacco Use  . Smoking  status: Former    Current packs/day: 1.00    Average packs/day: 1 pack/day for 20.0 years (20.0 ttl pk-yrs)    Types: Cigarettes  . Smokeless tobacco: Former  . Tobacco comments:    Cigarette smoker pre 1980  Vaping Use  . Vaping status: Never Used  Substance and Sexual Activity  . Alcohol use: Yes    Alcohol/week: 4.0 standard drinks of alcohol    Types: 4 Glasses of wine per week    Comment: 4x weekly  . Drug use: No  . Sexual activity: Yes    Partners: Female    Birth control/protection: None   Social Drivers of Health   Financial Resource Strain: Low Risk  (11/12/2023)   Overall Financial Resource Strain (CARDIA)   . Difficulty of Paying Living Expenses: Not hard at all  Food Insecurity: No Food Insecurity (11/12/2023)   Hunger Vital Sign   . Worried  About Running Out of Food in the Last Year: Never true   . Ran Out of Food in the Last Year: Never true  Transportation Needs: No Transportation Needs (11/12/2023)   PRAPARE - Transportation   . Lack of Transportation (Medical): No   . Lack of Transportation (Non-Medical): No  Housing Stability: Low Risk  (11/12/2023)   Housing Stability Vital Sign   . Unable to Pay for Housing in the Last Year: No   . Number of Times Moved in the Last Year: 0   . Homeless in the Last Year: No    Family History  Problem Relation Name Age of Onset  . Myocardial Infarction (Heart attack) Mother Mother   . Alzheimer's disease Mother Mother   . Dementia Mother Mother   . Stroke Mother Mother   . Lung disease Father    . Stroke Father    . Diabetes type II Daughter Burnard Don     A comprehensive ROS was negative except for HPI  PE: BP 112/78   Pulse (!) 49   Ht 162.6 cm (5' 4)   Wt 82.6 kg (182 lb)   SpO2 97%   BMI 31.24 kg/m  General. Alert oriented x3    Eyes. Sclera and conjunctiva clear; pupils equal round and reactive to light and accommodation; extraocular movements intact Nose. Mucosa healthy without drainage or  ulceration Oropharynx. No suspicious lesions Neck. No swelling, masses, stiffness, pain, limited movement, carotid pulses normal bilaterally, thyroid  normal size, no masses palpated.  No bruits Lungs. Respirations unlabored; clear to auscultation bilaterally Back. No spinal deformity Cardiovascular. Heart regular rate and rhythm with 2/6 systolic murmur noted. No gallops, or rubs Abdomen. Soft; non tender; non distended; normoactive bowel sounds; no masses or organomegaly Lymph Nodes. No significant cervical, supraclavicular, axillary or inguinal lymphadenopathy noted Musculoskeletal. No deformities; no active joint inflammation Extremities. Normal, no edema Pulses. Dorsalis pedis palpable and symmetric bilaterally Neurologic. Alert and oriented; speech intact; face symmetrical; moves all extremities well  Appointment on 10/10/2023  Component Date Value Ref Range Status  . Cholesterol, Total 10/10/2023 118  100 - 200 mg/dL Final  . Triglyceride 94/92/7974 77  35 - 199 mg/dL Final  . HDL (High Density Lipoprotein) Cho* 10/10/2023 24.7 (L)  29.0 - 71.0 mg/dL Final  . LDL Calculated 10/10/2023 78  0 - 130 mg/dL Final  . VLDL Cholesterol 10/10/2023 15  mg/dL Final  . Cholesterol/HDL Ratio 10/10/2023 4.8   Final  . WBC (White Blood Cell Count) 10/10/2023 6.1  4.1 - 10.2 10^3/uL Final  . RBC (Red Blood Cell Count) 10/10/2023 4.76  4.69 - 6.13 10^6/uL Final  . Hemoglobin 10/10/2023 14.4  14.1 - 18.1 gm/dL Final  . Hematocrit 94/92/7974 43.8  40.0 - 52.0 % Final  . MCV (Mean Corpuscular Volume) 10/10/2023 92.0  80.0 - 100.0 fl Final  . MCH (Mean Corpuscular Hemoglobin) 10/10/2023 30.3  27.0 - 31.2 pg Final  . MCHC (Mean Corpuscular Hemoglobin * 10/10/2023 32.9  32.0 - 36.0 gm/dL Final  . Platelet Count 10/10/2023 221  150 - 450 10^3/uL Final  . RDW-CV (Red Cell Distribution Widt* 10/10/2023 15.4 (H)  11.6 - 14.8 % Final  . MPV (Mean Platelet Volume) 10/10/2023 12.0  9.4 - 12.4 fl Final  .  Neutrophils 10/10/2023 3.98  1.50 - 7.80 10^3/uL Final  . Lymphocytes 10/10/2023 1.32  1.00 - 3.60 10^3/uL Final  . Monocytes 10/10/2023 0.63  0.00 - 1.50 10^3/uL Final  . Eosinophils 10/10/2023 0.17  0.00 -  0.55 10^3/uL Final  . Basophils 10/10/2023 0.03  0.00 - 0.09 10^3/uL Final  . Neutrophil % 10/10/2023 64.7  32.0 - 70.0 % Final  . Lymphocyte % 10/10/2023 21.5  10.0 - 50.0 % Final  . Monocyte % 10/10/2023 10.3  4.0 - 13.0 % Final  . Eosinophil % 10/10/2023 2.8  1.0 - 5.0 % Final  . Basophil% 10/10/2023 0.5  0.0 - 2.0 % Final  . Immature Granulocyte % 10/10/2023 0.2  <=0.7 % Final  . Immature Granulocyte Count 10/10/2023 0.01  <=0.06 10^3/L Final  . Glucose 10/10/2023 121 (H)  70 - 110 mg/dL Final  . Sodium 94/92/7974 140  136 - 145 mmol/L Final  . Potassium 10/10/2023 3.6  3.6 - 5.1 mmol/L Final  . Chloride 10/10/2023 101  97 - 109 mmol/L Final  . Carbon Dioxide (CO2) 10/10/2023 28.6  22.0 - 32.0 mmol/L Final  . Urea Nitrogen (BUN) 10/10/2023 16  7 - 25 mg/dL Final  . Creatinine 94/92/7974 1.0  0.7 - 1.3 mg/dL Final  . Glomerular Filtration Rate (eGFR) 10/10/2023 76  >60 mL/min/1.73sq m Final  . Calcium  10/10/2023 9.0  8.7 - 10.3 mg/dL Final  . AST  94/92/7974 21  8 - 39 U/L Final  . ALT  10/10/2023 17  6 - 57 U/L Final  . Alk Phos (alkaline Phosphatase) 10/10/2023 146 (H)  34 - 104 U/L Final  . Albumin 10/10/2023 4.0  3.5 - 4.8 g/dL Final  . Bilirubin, Total 10/10/2023 1.0  0.3 - 1.2 mg/dL Final  . Protein, Total 10/10/2023 6.9  6.1 - 7.9 g/dL Final  . A/G Ratio 94/92/7974 1.4  1.0 - 5.0 gm/dL Final  . PSA (Prostate Specific Antigen), T* 10/10/2023 1.69  0.10 - 4.00 ng/mL Final  . Thyroid  Stimulating Hormone (TSH) 10/10/2023 7.313 (H)  0.450-5.330 uIU/ml uIU/mL Final  . Color 10/10/2023 Yellow  Colorless, Straw, Light Yellow, Yellow, Dark Yellow Final  . Clarity 10/10/2023 Clear  Clear Final  . Specific Gravity 10/10/2023 1.015  1.000 - 1.030 Final  . pH, Urine 10/10/2023 5.5   5.0 - 8.0 Final  . Protein, Urinalysis 10/10/2023 30 (!)  Negative, Trace mg/dL Final  . Glucose, Urinalysis 10/10/2023 Negative  Negative mg/dL Final  . Ketones, Urinalysis 10/10/2023 Negative  Negative mg/dL Final  . Blood, Urinalysis 10/10/2023 Negative  Negative Final  . Nitrite, Urinalysis 10/10/2023 Negative  Negative Final  . Leukocyte Esterase, Urinalysis 10/10/2023 Negative  Negative Final  . White Blood Cells, Urinalysis 10/10/2023 None Seen  None Seen, 0-3 /hpf Final  . Red Blood Cells, Urinalysis 10/10/2023 None Seen  None Seen, 0-3 /hpf Final  . Bacteria, Urinalysis 10/10/2023 None Seen  None Seen /hpf Final  . Squamous Epithelial Cells, Urinaly* 10/10/2023 None Seen  Rare, Few, None Seen /hpf Final  . Hemoglobin A1C 10/10/2023 6.8 (H)  4.2 - 5.6 % Final  . Average Blood Glucose (Calc) 10/10/2023 148  mg/dL Final  Office Visit on 08/01/2023  Component Date Value Ref Range Status  . Vent Rate (bpm) 08/01/2023 75   Final  . QRS Interval (msec) 08/01/2023 146   Final  . QT Interval (msec) 08/01/2023 468   Final  . QTc (msec) 08/01/2023 522   Final  Office Visit on 05/25/2023  Component Date Value Ref Range Status  . WBC (White Blood Cell Count) 05/25/2023 6.1  4.1 - 10.2 10^3/uL Final  . RBC (Red Blood Cell Count) 05/25/2023 4.52 (L)  4.69 - 6.13 10^6/uL Final  . Hemoglobin  05/25/2023 15.0  14.1 - 18.1 gm/dL Final  . Hematocrit 87/79/7975 43.5  40.0 - 52.0 % Final  . MCV (Mean Corpuscular Volume) 05/25/2023 96.2  80.0 - 100.0 fl Final  . MCH (Mean Corpuscular Hemoglobin) 05/25/2023 33.2 (H)  27.0 - 31.2 pg Final  . MCHC (Mean Corpuscular Hemoglobin * 05/25/2023 34.5  32.0 - 36.0 gm/dL Final  . Platelet Count 05/25/2023 174  150 - 450 10^3/uL Final  . RDW-CV (Red Cell Distribution Widt* 05/25/2023 14.0  11.6 - 14.8 % Final  . MPV (Mean Platelet Volume) 05/25/2023 11.6  9.4 - 12.4 fl Final  . Neutrophils 05/25/2023 4.07  1.50 - 7.80 10^3/uL Final  . Lymphocytes 05/25/2023 1.45   1.00 - 3.60 10^3/uL Final  . Monocytes 05/25/2023 0.47  0.00 - 1.50 10^3/uL Final  . Eosinophils 05/25/2023 0.09  0.00 - 0.55 10^3/uL Final  . Basophils 05/25/2023 0.01  0.00 - 0.09 10^3/uL Final  . Neutrophil % 05/25/2023 66.6  32.0 - 70.0 % Final  . Lymphocyte % 05/25/2023 23.7  10.0 - 50.0 % Final  . Monocyte % 05/25/2023 7.7  4.0 - 13.0 % Final  . Eosinophil % 05/25/2023 1.5  1.0 - 5.0 % Final  . Basophil% 05/25/2023 0.2  0.0 - 2.0 % Final  . Immature Granulocyte % 05/25/2023 0.3  <=0.7 % Final  . Immature Granulocyte Count 05/25/2023 0.02  <=0.06 10^3/L Final  . Glucose 05/25/2023 112 (H)  70 - 110 mg/dL Final  . Sodium 87/79/7975 142  136 - 145 mmol/L Final  . Potassium 05/25/2023 3.8  3.6 - 5.1 mmol/L Final  . Chloride 05/25/2023 103  97 - 109 mmol/L Final  . Carbon Dioxide (CO2) 05/25/2023 29.6  22.0 - 32.0 mmol/L Final  . Urea Nitrogen (BUN) 05/25/2023 13  7 - 25 mg/dL Final  . Creatinine 87/79/7975 0.9  0.7 - 1.3 mg/dL Final  . Glomerular Filtration Rate (eGFR) 05/25/2023 86  >60 mL/min/1.73sq m Final  . Calcium  05/25/2023 9.4  8.7 - 10.3 mg/dL Final  . AST  87/79/7975 28  8 - 39 U/L Final  . ALT  05/25/2023 30  6 - 57 U/L Final  . Alk Phos (alkaline Phosphatase) 05/25/2023 97  34 - 104 U/L Final  . Albumin 05/25/2023 4.5  3.5 - 4.8 g/dL Final  . Bilirubin, Total 05/25/2023 0.7  0.3 - 1.2 mg/dL Final  . Protein, Total 05/25/2023 6.7  6.1 - 7.9 g/dL Final  . A/G Ratio 87/79/7975 2.0  1.0 - 5.0 gm/dL Final  . Thyroid  Stimulating Hormone (TSH) 05/25/2023 3.956  0.450-5.330 uIU/ml uIU/mL Final  . Color 05/25/2023 Yellow  Colorless, Straw, Light Yellow, Yellow, Dark Yellow Final  . Clarity 05/25/2023 Clear  Clear Final  . Specific Gravity 05/25/2023 1.022  1.005 - 1.030 Final  . pH, Urine 05/25/2023 6.5  5.0 - 8.0 Final  . Protein, Urinalysis 05/25/2023 2+ (!)  Negative mg/dL Final  . Glucose, Urinalysis 05/25/2023 Negative  Negative mg/dL Final  . Ketones, Urinalysis  05/25/2023 Negative  Negative mg/dL Final  . Blood, Urinalysis 05/25/2023 Negative  Negative Final  . Nitrite, Urinalysis 05/25/2023 Negative  Negative Final  . Leukocyte Esterase, Urinalysis 05/25/2023 Negative  Negative Final  . Bilirubin, Urinalysis 05/25/2023 Negative  Negative Final  . Urobilinogen, Urinalysis 05/25/2023 2.0 (H)  0.2 - 1.0 mg/dL Final  . WBC, UA 87/79/7975 1  <=5 /hpf Final  . Red Blood Cells, Urinalysis 05/25/2023 2  <=3 /hpf Final  . Bacteria, Urinalysis 05/25/2023 0-5  0 - 5 /hpf Final  . Squamous Epithelial Cells, Urinaly* 05/25/2023 0  /hpf Final  . Hemoglobin A1C 05/25/2023 6.6 (H)  4.2 - 5.6 % Final  . Average Blood Glucose (Calc) 05/25/2023 143  mg/dL Final  Appointment on 90/95/7975  Component Date Value Ref Range Status  . Cholesterol, Total 02/07/2023 158  100 - 200 mg/dL Final  . Triglyceride 90/95/7975 137  35 - 199 mg/dL Final  . HDL (High Density Lipoprotein) Cho* 02/07/2023 26.3 (L)  29.0 - 71.0 mg/dL Final  . LDL Calculated 02/07/2023 895  0 - 130 mg/dL Final  . VLDL Cholesterol 02/07/2023 27  mg/dL Final  . Cholesterol/HDL Ratio 02/07/2023 6.0   Final  . WBC (White Blood Cell Count) 02/07/2023 6.2  4.1 - 10.2 10^3/uL Final  . RBC (Red Blood Cell Count) 02/07/2023 4.64 (L)  4.69 - 6.13 10^6/uL Final  . Hemoglobin 02/07/2023 15.1  14.1 - 18.1 gm/dL Final  . Hematocrit 90/95/7975 43.7  40.0 - 52.0 % Final  . MCV (Mean Corpuscular Volume) 02/07/2023 94.2  80.0 - 100.0 fl Final  . MCH (Mean Corpuscular Hemoglobin) 02/07/2023 32.5 (H)  27.0 - 31.2 pg Final  . MCHC (Mean Corpuscular Hemoglobin * 02/07/2023 34.6  32.0 - 36.0 gm/dL Final  . Platelet Count 02/07/2023 169  150 - 450 10^3/uL Final  . RDW-CV (Red Cell Distribution Widt* 02/07/2023 14.5  11.6 - 14.8 % Final  . MPV (Mean Platelet Volume) 02/07/2023 12.0  9.4 - 12.4 fl Final  . Neutrophils 02/07/2023 3.47  1.50 - 7.80 10^3/uL Final  . Lymphocytes 02/07/2023 1.91  1.00 - 3.60 10^3/uL Final  .  Monocytes 02/07/2023 0.52  0.00 - 1.50 10^3/uL Final  . Eosinophils 02/07/2023 0.23  0.00 - 0.55 10^3/uL Final  . Basophils 02/07/2023 0.02  0.00 - 0.09 10^3/uL Final  . Neutrophil % 02/07/2023 56.3  32.0 - 70.0 % Final  . Lymphocyte % 02/07/2023 31.0  10.0 - 50.0 % Final  . Monocyte % 02/07/2023 8.4  4.0 - 13.0 % Final  . Eosinophil % 02/07/2023 3.7  1.0 - 5.0 % Final  . Basophil% 02/07/2023 0.3  0.0 - 2.0 % Final  . Immature Granulocyte % 02/07/2023 0.3  <=0.7 % Final  . Immature Granulocyte Count 02/07/2023 0.02  <=0.06 10^3/L Final  . Glucose 02/07/2023 122 (H)  70 - 110 mg/dL Final  . Sodium 90/95/7975 142  136 - 145 mmol/L Final  . Potassium 02/07/2023 3.8  3.6 - 5.1 mmol/L Final  . Chloride 02/07/2023 104  97 - 109 mmol/L Final  . Carbon Dioxide (CO2) 02/07/2023 30.9  22.0 - 32.0 mmol/L Final  . Urea Nitrogen (BUN) 02/07/2023 16  7 - 25 mg/dL Final  . Creatinine 90/95/7975 0.9  0.7 - 1.3 mg/dL Final  . Glomerular Filtration Rate (eGFR) 02/07/2023 86  >60 mL/min/1.73sq m Final  . Calcium  02/07/2023 9.4  8.7 - 10.3 mg/dL Final  . AST  90/95/7975 25  8 - 39 U/L Final  . ALT  02/07/2023 28  6 - 57 U/L Final  . Alk Phos (alkaline Phosphatase) 02/07/2023 99  34 - 104 U/L Final  . Albumin 02/07/2023 4.3  3.5 - 4.8 g/dL Final  . Bilirubin, Total 02/07/2023 0.9  0.3 - 1.2 mg/dL Final  . Protein, Total 02/07/2023 6.9  6.1 - 7.9 g/dL Final  . A/G Ratio 90/95/7975 1.7  1.0 - 5.0 gm/dL Final  . PSA (Prostate Specific Antigen), T* 02/07/2023 1.47  0.10 - 4.00 ng/mL  Final  . Thyroid  Stimulating Hormone (TSH) 02/07/2023 2.767  0.450-5.330 uIU/ml uIU/mL Final  . Color 02/07/2023 Yellow  Colorless, Straw, Light Yellow, Yellow, Dark Yellow Final  . Clarity 02/07/2023 Clear  Clear Final  . Specific Gravity 02/07/2023 1.015  1.000 - 1.030 Final  . pH, Urine 02/07/2023 5.5  5.0 - 8.0 Final  . Protein, Urinalysis 02/07/2023 Negative  Negative, Trace mg/dL Final  . Glucose, Urinalysis 02/07/2023  Negative  Negative mg/dL Final  . Ketones, Urinalysis 02/07/2023 Negative  Negative mg/dL Final  . Blood, Urinalysis 02/07/2023 Negative  Negative Final  . Nitrite, Urinalysis 02/07/2023 Negative  Negative Final  . Leukocyte Esterase, Urinalysis 02/07/2023 Negative  Negative Final  . White Blood Cells, Urinalysis 02/07/2023 None Seen  None Seen, 0-3 /hpf Final  . Red Blood Cells, Urinalysis 02/07/2023 None Seen  None Seen, 0-3 /hpf Final  . Bacteria, Urinalysis 02/07/2023 None Seen  None Seen /hpf Final  . Squamous Epithelial Cells, Urinaly* 02/07/2023 None Seen  Rare, Few, None Seen /hpf Final  . Hemoglobin A1C 02/07/2023 6.8 (H)  4.2 - 5.6 % Final  . Average Blood Glucose (Calc) 02/07/2023 148  mg/dL Final  Ancillary Procedure on 01/09/2023  Component Date Value Ref Range Status  . LV Ejection Fraction (%) 01/09/2023 55  % Final  . Right Ventricle Systolic Pressure * 01/09/2023 39  mmHg Final  . Left Atrium Diameter (cm) 01/09/2023 4.1  cm Final  . LV End Diastolic Diameter (cm) 01/09/2023 3.8  cm Final  . LV End Systolic Diameter (cm) 01/09/2023 2.1  cm Final  . LV Septum Wall Thickness (cm) 01/09/2023 1.1  cm Final  . LV Posterior Wall Thickness (cm) 01/09/2023 1.2  cm Final  . Tricuspid Valve Regurgitation Grade 01/09/2023 severe   Final  . Tricuspid Valve Regurgitation Max * 01/09/2023 2.8  m/s Final  . Mitral Valve Regurgitation Grade 01/09/2023 mild   Final  . Mitral Valve Stenosis Grade 01/09/2023 none   Final  . Aortic Valve Regurgitation Grade 01/09/2023 trivial   Final  . Aortic Valve Stenosis Grade 01/09/2023 mild   Final  . Aortic Valve Stenosis Mean Gradien* 01/09/2023 18  mmHg Final  . Aortic Valve Max Velocity (m/s) 01/09/2023 2.8  m/s Final   DIAGNOSIS: Essential hypertension, benign  (primary encounter diagnosis)  Paroxysmal atrial fibrillation (CMS/HHS-HCC)  Atherosclerosis of native coronary artery of native heart without angina  pectoris  Prediabetes  Acquired hypothyroidism  Pure hypercholesterolemia  History of CVA (cerebrovascular accident)   PLAN: HTN- stable, same meds Thyroid  dz- same meds. Labs today HLD- diet/exercise/statin Prediabetes- diet/exercise/water  RTC 3 mo, sooner if needed     Attestation Statement:   I personally performed the service. (TP)  Reyes JONETTA Costa, MD, MD

## 2024-01-08 ENCOUNTER — Other Ambulatory Visit: Payer: Self-pay

## 2024-01-08 ENCOUNTER — Other Ambulatory Visit (HOSPITAL_COMMUNITY): Payer: Self-pay

## 2024-01-10 ENCOUNTER — Ambulatory Visit: Payer: Self-pay | Admitting: Urology

## 2024-01-12 NOTE — Progress Notes (Signed)
 Cardiology Clinic Note   Date: 01/12/2024 ID: MERRIL NAGY, DOB Jul 25, 1940, MRN [id number]  Primary Cardiologist:  Redell Cave, MD  Chief Complaint   MARCIANO MUNDT is a 83 y.o. male who presents to the clinic today for ***  Patient Profile   USTIN CRUICKSHANK is followed by Dr. Cave for the history outlined below.       Past medical history significant for: CAD. PCI 2010 LHC 07/25/2017 (chest pain): Proximal RCA 100% with collaterals from LCx.  Proximal to mid LAD 95%.  OM2 10%.  Lat OM 2 90% in-stent restenosis.  Mid to distal LCx 90%.  Patent stent to mid LCx into large OM 2.  CT surgery referral. CABG x 4 08/06/2017: LIMA to LAD, SVG to PDA, SVG to D1, SVG to OM1. PAF/a-flutter. Onset postop CABG March 2019. Recurrence 2023. DCCV 10/12/2023. Chronic diastolic heart failure/pulmonary hypertension/aortic valve stenosis. Echo 11/19/2023: EF 55 to 60%.  No RWMA.  Indeterminate diastolic parameters.  Interventricular septum is flattened in diastole consistent with right ventricular volume overload.  Normal RV function.  Mild RVH.  Moderately elevated PA pressure, RVSP 42 mmHg.  Mild to moderate LAE.  Severe RAE.  Mild MR.  Mild to moderate TR.  Mild aortic valve stenosis mean gradient 11 mmHg.  Dilated IVC, RA pressure 15 mmHg. Carotid artery stenosis. Carotid stent placement 11/08/2021: Left ICA. Carotid duplex 09/10/2023: Right ICA 40 to 59%, hemodynamically significant plaque > 50% right CCA.  Widely patent stent left ICA, nonhemodynamically significant plaque <50% left CCA. Hypertension. Hyperlipidemia. Lipid panel 10/10/2023: LDL 78, HDL 25, TG 77, total 118. TIA. GERD. Hypothyroidism. Migraines.  In summary, patient was previously followed by Dr. Bosie and Dr. Florencio at [facility] cardiology.  He has a history of CAD with PCI in 2010.  In February 2019 he underwent LHC for chest pain showing three-vessel CAD.  He underwent CABG x 4 in March 2019.  Postop course  complicated by A-fib.  Patient had a TIA in February 2021.  Echo at that time demonstrated EF 55 to 60%, no RWMA, Grade I DD, normal RV size, normal PA pressure, aortic valve normal in structure and function.  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  Holter monitor October 2023 revealed A-fib/flutter average rate 59 bpm.  He was started on Eliquis  for stroke prophylaxis.  He has carotid artery disease followed by vascular surgery.  He had left ICA stent placement in June 2023.  Patient was first evaluated by Dr. Cave on 10/01/2023 to establish care.  EKG revealed A-fib 92 bpm and he was scheduled for DCCV.  He was referred to EP.  Patient was last seen in the office by Dr. Cindie on 11/28/2023 for evaluation of A-fib.  He reported doing well 1 to 2 weeks post cardioversion with clear thinking, getting around faster, and improved breathing.  At the time of his visit with Dr. Cindie he complained of recurrence of symptoms with trouble thinking and poor mobility.  EKG showed A-fib 72 bpm.  Decision was made to pursue rhythm control.  He was started on amiodarone  with plan for cardioversion 6 to 8 weeks following.   Patient was last seen in the office by me on 12/13/2023 for evaluation of edema.  Patient noted mild lower extremity edema after shoulder surgery that progressed up his leg to his abdomen including his scrotal area.  He denies shortness  of breath, orthopnea, PND.  He had been sleeping in a recliner secondary to shoulder surgery but was able to lay all the way back without dyspnea.  He was started on torsemide  by his PCP and noted brisk diuresis at first.  He reported edema below his knees improved but he continued to have edema above his knees to his abdomen.  He was weighing daily.  Normal weight 175 and weight that morning 184.  Patient was bradycardic at 44 bpm at  the time of his visit without lightheadedness, dizziness, presyncope, syncope.  Toprol  was decreased to 50 mg daily.  He was started on Farxiga .  Labs were drawn at the time of his visit and kidney function had worsened.  He was instructed to decrease torsemide  to 20 mg daily and return for recheck of BMP.     History of Present Illness    Today, patient ***  CAD S/p PCI 2010, CABG x 06 August 2017.  Patient denies chest pain, pressure or tightness.  - Continue Toprol , atorvastatin , fenofibrate .  Patient on Plavix  for history of TIA while on Eliquis .   Chronic diastolic heart failure/pulmonary hypertension/aortic valve stenosis Echo June 2025 demonstrated EF 55 to 60%, interventricular septum is flattened in diastole consistent with right ventricular volume overload, normal RV function, mild RVH, moderately elevated PA pressure, mild to moderate LAE, severe RAE, mild MR, mild to moderate TR, mild aortic valve stenosis mean gradient 11 mmHg.  Patient***  -Continue Toprol , lisinopril , torsemide , Farxiga . -BMP today.   PAF Initial onset postop CABG March 2019, recurrence 2023.  S/p DCCV May 2025.  Denies spontaneous bleeding concerns.  Patient denies palpitations. EKG shows sinus bradycardia 44 bpm. Patient denies lightheadedness, dizziness, presyncope or syncope.*** - Continue Toprol , amiodarone , Eliquis . Appropriate Eliquis  dose.   Carotid artery disease S/p left ICA stent placement June 2023.  Carotid duplex April 2025 showed right ICA 40 to 59%, widely patent stent left ICA.  Patient denies dizziness, lightheadedness, presyncope or syncope.*** - Continue atorvastatin , fenofibrate , Plavix . - Continue to follow with vascular surgery.   Hypertension BP today***. No headaches or dizziness reported.  - Continue lisinopril , Toprol .   Hyperlipidemia LDL 78 May 2025, not at goal. *** - Continue atorvastatin  and fenofibrate .  ROS: All other systems reviewed and are otherwise negative except as  noted in History of Present Illness.  EKGs/Labs Reviewed        12/13/2023: BUN 44; Creatinine, Ser 1.82; Potassium 3.9; Sodium 142   10/01/2023: Hemoglobin 13.9; WBC 6.6   No results found for requested labs within last 365 days.   No results found for requested labs within last 365 days.  ***  Risk Assessment/Calculations    {Does this patient have ATRIAL FIBRILLATION?:[id number]} No BP recorded.  {Refresh Note OR Click here to enter BP  :1}***        Physical Exam    VS:  There were no vitals taken for this visit. , BMI There is no height or weight on file to calculate BMI.  GEN: Well nourished, well developed, in no acute distress. Neck: No JVD or carotid bruits. Cardiac: *** RRR. *** No murmur. No rubs or gallops.   Respiratory:  Respirations regular and unlabored. Clear to auscultation without rales, wheezing or rhonchi. GI: Soft, nontender, nondistended. Extremities: Radials/DP/PT 2+ and equal bilaterally. No clubbing or cyanosis. No edema ***  Skin: Warm and dry, no rash. Neuro: Strength intact.  Assessment & Plan   ***  Disposition: ***     {Are  you ordering a CV Procedure (e.g. stress test, cath, DCCV, TEE, etc)?   Press F2        :[id number]}   Signed, Barnie HERO. Zackrey Dyar, DNP, NP-C

## 2024-01-15 ENCOUNTER — Ambulatory Visit: Attending: Student | Admitting: Student

## 2024-01-15 ENCOUNTER — Ambulatory Visit: Admit: 2024-01-15 | Admitting: Cardiovascular Disease

## 2024-01-15 ENCOUNTER — Encounter: Payer: Self-pay | Admitting: Student

## 2024-01-15 VITALS — BP 122/64 | HR 50 | Ht 65.0 in | Wt 193.2 lb

## 2024-01-15 DIAGNOSIS — I6521 Occlusion and stenosis of right carotid artery: Secondary | ICD-10-CM | POA: Diagnosis not present

## 2024-01-15 DIAGNOSIS — E785 Hyperlipidemia, unspecified: Secondary | ICD-10-CM | POA: Diagnosis not present

## 2024-01-15 DIAGNOSIS — I480 Paroxysmal atrial fibrillation: Secondary | ICD-10-CM

## 2024-01-15 DIAGNOSIS — Z79899 Other long term (current) drug therapy: Secondary | ICD-10-CM

## 2024-01-15 DIAGNOSIS — I25810 Atherosclerosis of coronary artery bypass graft(s) without angina pectoris: Secondary | ICD-10-CM

## 2024-01-15 DIAGNOSIS — I10 Essential (primary) hypertension: Secondary | ICD-10-CM | POA: Diagnosis not present

## 2024-01-15 DIAGNOSIS — Z01818 Encounter for other preprocedural examination: Secondary | ICD-10-CM

## 2024-01-15 SURGERY — CARDIOVERSION
Anesthesia: General

## 2024-01-15 MED ORDER — TORSEMIDE 20 MG PO TABS: 20.0000 mg | ORAL_TABLET | Freq: Every day | ORAL | 3 refills | Status: DC

## 2024-01-15 NOTE — Patient Instructions (Signed)
 Medication Instructions:  Your physician recommends the following medication changes.  START TAKING: Torsemide  20 mg once daily  *If you need a refill on your cardiac medications before your next appointment, please call your pharmacy*  Lab Work: Your provider would like for you to have following labs drawn today BMet, BNP.  Your provider would like for you to return in 2 Weeks to have the following labs drawn: BMet.   Please go to [facility] [address] ([facility] [address], [place] [address] You do not need an appointment.  They are open from 8 am- 4:30 pm.  Lunch from 1:00 pm- 2:00 pm You do not need to be fasting.   You may also go to one of the following [facility]:  [address] [place], [place] [address] Phone: [phone] Lab hours: Mon-Fri 8 am- 5 pm    Lunch 12 pm- 1 pm  [address] [place],  [place]  [address]  [place]  Phone: [phone] Lab hours: 7 am- 4 pm Lunch 12 pm-1 pm   [address] [place],  [place]  [address]  [place]  Phone: [phone] Lab hours: Mon-Fri 8 am- 5 pm    Lunch 12 pm- 1 pm    If you have labs (blood work) drawn today and your tests are completely normal, you will receive your results only by: MyChart Message (if you have MyChart) OR A paper copy in the mail If you have any lab test that is abnormal or we need to change your treatment, we will call you to review the results.  Testing/Procedures: None ordered at this time   Follow-Up: At [facility], you and your health needs are our priority.  As part of our continuing mission to provide you with exceptional heart care, our providers are all part of one team.  This team includes your primary Cardiologist (physician) and Advanced Practice Providers or APPs (Physician Assistants and Nurse Practitioners) who all work together to provide you with the care you need, when you need it.  Your next appointment:   1 month(s)  Provider:   Barnie Hila, NP     We recommend signing up for the patient portal called MyChart.  Sign up information is provided on this After Visit Summary.  MyChart is used to connect with patients for Virtual Visits (Telemedicine).  Patients are able to view lab/test results, encounter notes, upcoming appointments, etc.  Non-urgent messages can be sent to your provider as well.   To learn more about what you can do with MyChart, go to [email or web address].   Other Instructions  Please use the provided Weight log to monitor your weight daily.  Make copies and bring with you to your next appointment.    Weigh yourself each morning after you wake up and use the bathroom but before you eat or drink anything.

## 2024-01-16 ENCOUNTER — Other Ambulatory Visit (HOSPITAL_COMMUNITY): Payer: Self-pay

## 2024-01-16 LAB — BASIC METABOLIC PANEL WITH GFR
BUN/Creatinine Ratio: 14 (ref 10–24)
BUN: 23 mg/dL (ref 8–27)
CO2: 21 mmol/L (ref 20–29)
Calcium: 9.9 mg/dL (ref 8.6–10.2)
Chloride: 102 mmol/L (ref 96–106)
Creatinine, Ser: 1.63 mg/dL — ABNORMAL HIGH (ref 0.76–1.27)
Glucose: 116 mg/dL — ABNORMAL HIGH (ref 70–99)
Potassium: 4.6 mmol/L (ref 3.5–5.2)
Sodium: 138 mmol/L (ref 134–144)
eGFR: 42 mL/min/1.73 — ABNORMAL LOW (ref >59)

## 2024-01-16 LAB — BRAIN NATRIURETIC PEPTIDE: BNP: 462.6 pg/mL — ABNORMAL HIGH (ref 0.0–100.0)

## 2024-01-18 DIAGNOSIS — G4733 Obstructive sleep apnea (adult) (pediatric): Secondary | ICD-10-CM | POA: Diagnosis not present

## 2024-01-30 ENCOUNTER — Ambulatory Visit (INDEPENDENT_AMBULATORY_CARE_PROVIDER_SITE_OTHER)

## 2024-01-30 ENCOUNTER — Other Ambulatory Visit
Admission: RE | Admit: 2024-01-30 | Discharge: 2024-01-30 | Disposition: A | Discharge status: Home / Self Care | Attending: Student | Admitting: Student

## 2024-01-30 ENCOUNTER — Ambulatory Visit: Payer: Self-pay | Admitting: Family Medicine

## 2024-01-30 ENCOUNTER — Ambulatory Visit: Payer: Self-pay | Admitting: Student

## 2024-01-30 ENCOUNTER — Ambulatory Visit
Admission: EM | Admit: 2024-01-30 | Discharge: 2024-01-30 | Disposition: A | Discharge status: Home / Self Care | Attending: Family Medicine | Admitting: Family Medicine

## 2024-01-30 ENCOUNTER — Encounter: Payer: Self-pay | Admitting: Emergency Medicine

## 2024-01-30 DIAGNOSIS — S300XXA Contusion of lower back and pelvis, initial encounter: Secondary | ICD-10-CM | POA: Diagnosis not present

## 2024-01-30 DIAGNOSIS — M160 Bilateral primary osteoarthritis of hip: Secondary | ICD-10-CM | POA: Diagnosis not present

## 2024-01-30 DIAGNOSIS — W19XXXA Unspecified fall, initial encounter: Secondary | ICD-10-CM

## 2024-01-30 DIAGNOSIS — Z79899 Other long term (current) drug therapy: Secondary | ICD-10-CM | POA: Diagnosis not present

## 2024-01-30 DIAGNOSIS — M7918 Myalgia, other site: Secondary | ICD-10-CM

## 2024-01-30 DIAGNOSIS — M25552 Pain in left hip: Secondary | ICD-10-CM

## 2024-01-30 DIAGNOSIS — M8580 Other specified disorders of bone density and structure, unspecified site: Secondary | ICD-10-CM | POA: Diagnosis not present

## 2024-01-30 LAB — BASIC METABOLIC PANEL WITH GFR
Anion gap: 12 (ref 5–15)
BUN: 31 mg/dL — ABNORMAL HIGH (ref 8–23)
CO2: 25 mmol/L (ref 22–32)
Calcium: 9.2 mg/dL (ref 8.9–10.3)
Chloride: 100 mmol/L (ref 98–111)
Creatinine, Ser: 1.55 mg/dL — ABNORMAL HIGH (ref 0.61–1.24)
GFR, Estimated: 45 mL/min — ABNORMAL LOW (ref >60)
Glucose, Bld: 110 mg/dL — ABNORMAL HIGH (ref 70–99)
Potassium: 4 mmol/L (ref 3.5–5.1)
Sodium: 137 mmol/L (ref 135–145)

## 2024-01-30 MED ORDER — METHOCARBAMOL 500 MG PO TABS: 500.0000 mg | ORAL_TABLET | Freq: Every evening | ORAL | 0 refills | Status: DC | PRN

## 2024-01-30 NOTE — ED Triage Notes (Signed)
 Pt states he feel in the bathroom 1 week ago and landed on his tailbone. He c/o left hip and buttock pain.

## 2024-01-30 NOTE — Discharge Instructions (Addendum)
 On my review of your xray images, you did not have any fractures or dislocated bones. The radiologist has not yet read your xray. If it is significantly abnormal or urgent, someone will contact you.  You should see your results in MyChart.   Take the muscle relaxer (methocarbamol /Robaxin ) at bedtime, as needed. Apply warm compresses to help your body break down the blood trapped in the skin.

## 2024-01-30 NOTE — ED Provider Notes (Signed)
 [facility]    CSN: [id number] Arrival date & time: 01/30/24  1334      History   Chief Complaint Chief Complaint  Patient presents with   Hip Pain    HPI  HPI Warren Ingram is a 83 y.o. male.   Warren Ingram presents for left hip and buttock pain. About 10 days ago, he fell in the bathroom.  Has some bruising. Has had a hard time walking and sitting.  He has trouble sitting on the commade.  He sleeps in the recliner after the rotator cuff surgery. Taking Tylenol  for pain.  She wont left me drink my vodka.  Takes Eliquis  for AFIB.        Past Medical History:  Diagnosis Date   Aortic stenosis 07/26/2019   a.) TTE Bubble study 07/26/2019: mild AS (MPG 13.1); b.) TTE 05/16/2022: mod AS (MPG 12.5; AVA 0.89); c.) TTE 01/09/2023: mild AS (MPG 18; AVA 0.90)   Arthritis    Atrial fibrillation and flutter (HCC)    a.) CHA2DS2-VASc = 5 (age x2, HTN, CVA x2) as of 06/14/2023; b.) cardiac rate/rhythm maintained on oral metoprolol  tartrate; chronically anticoagulated using apixaban    Barrett esophagus    Benign neoplasm of abdomen    Bilateral carotid artery disease (HCC)    a.) doppler 07/21/2013: <50 % BICA; b.) 10/24/2021 --> radiographic string sign LICA --> PTA with stenting (10 x 8 x 40 Exact stent; c.) doppler 12/16/2021, 03/13/2022, 09/11/2022: 40-59% RICA, 1-39% LICA   Coronary artery disease 03/30/2009   a.) LHC/PCI 03/30/2009: 75% mLCX (PTCA), 95% OM1 (2.75 x 15 mm Xience V DES); b.) LHC 07/25/2017: 100% pRCA, 95% p-mLAD, 10% oOM2, 90% lat OM2, 90% m-dLCx --> CVTS consult; c.) s/p 4v CABG   Diastolic dysfunction 07/26/2019   a.) bubble study 07/26/2019: EF 55-60%, no RWMAs, G1DD, triv TR; b.) TTE 07/23/2016: EF >55%, LVH, mild LAE, AoV sclerosis, mildly elevated PASP, G1DD; c.) TTE 05/16/2022: EF >55%, mod LVH, mod BAE, mod RVE, mod AS), triv MR, mod TR, mild PR; d.) TTE 01/09/2023: EF >55%, LVH, mild RVE, sev RAE, triv AR, mild AS, mild MR, sev TR (RVSP 39),  triv PR   Diverticulitis    GERD (gastroesophageal reflux disease)    Headache    Heart murmur    History of hiatal hernia    Hypercholesterolemia    Hypertension    Hypothyroidism    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   a.) MRI brain 07/25/2019: acute lateral pontine infarct measuring 10 mm   Long term current use of clopidogrel     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   a.) MRI brain 07/25/2019: remote lacunar infact of the RIGHT lentiform nucleus and BILATERAL thalami   Nontraumatic complete tear of rotator cuff, right    On apixaban  therapy    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   a.) LIMA-LAD, SVG-PDA, SVG-D1, SVG-OM1; b.) POD1 patient with STE in V2-V3 --> Tx'd with colchicine for pericarditis   Skin cancer    T2DM (type 2 diabetes mellitus) (HCC)    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   a.) MRI brain 04/05/2022: small acute LEFT thalamic infarct   TIA (transient ischemic attack) 2015   Tubular adenoma     Patient Active Problem List   Diagnosis Date Noted   Persistent atrial fibrillation (HCC) 10/12/2023   Paresthesia 04/05/2022   Symptomatic carotid artery stenosis 10/09/2021   CVA (cerebral vascular accident) (HCC) 07/25/2019   Acute CVA (cerebrovascular accident) (HCC)  Hyperlipidemia    Status post total knee replacement using cement, 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   Hematochezia 12/30/2017   Hypothyroidism 12/30/2017   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   Benign neoplasm of colon 08/21/2013   CAD in native artery 08/21/2013   Diverticulitis of colon 08/21/2013   GERD (gastroesophageal reflux disease) 08/21/2013   Benign essential HTN 08/21/2013   Anal bleeding 08/21/2013   Arthritis, degenerative 08/21/2013   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   Pure hypercholesterolemia 08/21/2013   Adenomatous colon polyp 08/21/2013   Osteoarthrosis, unspecified whether generalized or localized, pelvic region and  thigh 08/21/2013    Past Surgical History:  Procedure Laterality Date   CARDIOVERSION N/A 10/12/2023   Procedure: CARDIOVERSION;  Surgeon: Darliss Rogue, MD;  Location: [facility] ORS;  Service: Cardiovascular;  Laterality: N/A;   CAROTID PTA/STENT INTERVENTION Left 11/08/2021   Procedure: CAROTID PTA/STENT INTERVENTION;  Surgeon: Jama Cordella MATSU, MD;  Location: [facility] IN[facility];  Service: Cardiovascular;  Laterality: Left;   COLONOSCOPY WITH PROPOFOL  N/A 01/01/2018   Procedure: COLONOSCOPY WITH PROPOFOL ;  Surgeon: Therisa Bi, MD;  Location: [facility] ENDOSCOPY;  Service: Gastroenterology;  Laterality: N/A;   CORONARY ANGIOPLASTY WITH STENT PLACEMENT Left 03/30/2009   Procedure: CORONARY ANGIOPLASTY WITH STENT PLACEMENT; Location: [facility]; Surgeons: Vinie Jude, MD (diagnostic) and Marsa Dooms, MD (interventional)   CORONARY ARTERY BYPASS GRAFT  08/06/2017   4 vessels   ESOPHAGOGASTRODUODENOSCOPY (EGD) WITH PROPOFOL  N/A 10/28/2015   Procedure: ESOPHAGOGASTRODUODENOSCOPY (EGD) WITH PROPOFOL ;  Surgeon: Deward CINDERELLA Piedmont, MD;  Location: [facility] ENDOSCOPY;  Service: Gastroenterology;  Laterality: N/A;   KNEE ARTHROSCOPY     LEFT HEART CATH AND CORONARY ANGIOGRAPHY N/A 07/25/2017   Procedure: LEFT HEART CATH AND CORONARY ANGIOGRAPHY;  Surgeon: Jude Vinie LABOR, MD;  Location: [facility];  Service: Cardiovascular;  Laterality: N/A;   NASAL SINUS SURGERY     PARTIAL KNEE ARTHROPLASTY Right 06/27/2019   Procedure: UNICOMPARTMENTAL KNEE;  Surgeon: Edie Norleen PARAS, MD;  Location: [facility];  Service: Orthopedics;  Laterality: Right;   SHOULDER ARTHROSCOPY WITH SUBACROMIAL DECOMPRESSION, ROTATOR CUFF REPAIR AND BICEP TENDON REPAIR Right 06/19/2023   Procedure: RIGHT SHOULDER ARTHROSCOPY WITH DEBRIDEMENT, DECOMPRESSION, ROTATOR CUFF REPAIR;  Surgeon: Edie Norleen PARAS, MD;  Location: [facility];  Service: Orthopedics;  Laterality: Right;   TOTAL KNEE ARTHROPLASTY Left 12/19/2018   Procedure: TOTAL KNEE  ARTHROPLASTY;  Surgeon: Edie Norleen PARAS, MD;  Location: [facility];  Service: Orthopedics;  Laterality: Left;   VASECTOMY         Home Medications    Prior to Admission medications   Medication Sig Start Date End Date Taking? Authorizing Provider  gabapentin (NEURONTIN) 300 MG capsule Take 300 mg by mouth. 12/10/23 03/09/24 Yes [provider]  methocarbamol  (ROBAXIN ) 500 MG tablet Take 1 tablet (500 mg total) by mouth at bedtime as needed for muscle spasms. 01/30/24  Yes Jocie Meroney, DO  acetaminophen  (TYLENOL ) 500 MG tablet Take 1,000 mg by mouth every 6 (six) hours as needed for moderate pain (pain score 4-6).    [provider]  amiodarone  (PACERONE ) 200 MG tablet Take 1 tablet (200 mg total) by mouth 2 (two) times daily for 14 days, THEN 1 tablet (200 mg total) daily. 11/28/23 12/06/24  Cindie Ole DASEN, MD  apixaban  (ELIQUIS ) 5 MG TABS tablet Take 1 tablet (5 mg total) by mouth every 12 (twelve) hours. 11/19/23     atorvastatin  (LIPITOR ) 80 MG tablet  Take 1 tablet (80 mg total) by mouth daily. 03/28/23     bumetanide (BUMEX) 2 MG tablet Take 2 mg by mouth daily. Patient not taking: Reported on 01/15/2024 08/08/23 08/07/24  [provider]  clopidogrel  (PLAVIX ) 75 MG tablet Take 1 tablet (75 mg total) by mouth daily. 03/15/23     dapagliflozin  propanediol (FARXIGA ) 10 MG TABS tablet Take 1 tablet (10 mg total) by mouth daily before breakfast. 12/18/23   Loistine Sober, NP  fenofibrate  (TRICOR ) 145 MG tablet Take 1 tablet (145 mg total) by mouth daily. 01/31/23     GLUCOSAMINE-CHONDROITIN PO Take 1 capsule by mouth daily.    [provider]  levocetirizine (XYZAL) 5 MG tablet Take 1 tablet by mouth every evening. Patient not taking: Reported on 01/15/2024 09/26/23 09/25/24  [provider]  levothyroxine  (SYNTHROID ) 150 MCG tablet Take 1 tablet (150 mcg total) by mouth daily on an empty stomach with a glass of water  at least 30-60 minutes before  breakfast. 11/19/23     lisinopril  (ZESTRIL ) 10 MG tablet Take 1 tablet (10 mg total) by mouth every evening. Patient taking differently: Take 20 mg by mouth every evening. 04/08/23     metoprolol  succinate (TOPROL -XL) 50 MG 24 hr tablet Take 1 tablet (50 mg total) by mouth daily. 12/13/23 12/12/24  Loistine Sober, NP  Omega-3 Fatty Acids (FISH OIL) 1000 MG CAPS Take 1,000 mg by mouth 2 (two) times daily.    [provider]  omeprazole  (PRILOSEC) 20 MG capsule Take 1 capsule (20 mg total) by mouth daily. 03/28/23     silver sulfADIAZINE (SILVADENE) 1 % cream Apply 1 Application topically daily as needed (Buttocks). 08/30/23   [provider]  tamsulosin  (FLOMAX ) 0.4 MG CAPS capsule Take 1 capsule (0.4 mg total) by mouth daily. 03/28/23     torsemide  (DEMADEX ) 20 MG tablet Take 1 tablet (20 mg total) by mouth daily. 01/15/24   Loistine Sober, NP  triamcinolone  (NASACORT  ALLERGY 24HR) 55 MCG/ACT AERO nasal inhaler Place 2 sprays into the nose daily as needed (Congestion).    [provider]  triamcinolone  cream (KENALOG ) 0.5 % Apply to affected area twice daily as directed. Patient taking differently: Apply 1 Application topically daily as needed (Rash). 01/24/23       Family History Family History  Problem Relation Age of Onset   Heart attack Mother    Lung disease Father     Social History Social History   Tobacco Use   Smoking status: Former    Current packs/day: 0.00    Average packs/day: 1 pack/day for 15.0 years (15.0 ttl pk-yrs)    Types: Cigarettes    Start date: 06/05/1956    Quit date: 06/06/1971    Years since quitting: 52.6   Smokeless tobacco: Former  Building services engineer status: Never Used  Substance Use Topics   Alcohol use: Yes    Alcohol/week: 2.0 standard drinks of alcohol    Types: 1 Cans of beer, 1 Shots of liquor per week    Comment: 4 to 5 times per week   Drug use: No     Allergies   Patient has no known allergies.   Review  of Systems Review of Systems: :negative unless otherwise stated in HPI.      Physical Exam Triage Vital Signs ED Triage Vitals  Encounter Vitals Group     BP 01/30/24 1436 122/74     Girls Systolic BP Percentile --  Girls Diastolic BP Percentile --      Boys Systolic BP Percentile --      Boys Diastolic BP Percentile --      Pulse Rate 01/30/24 1436 (!) 45     Resp 01/30/24 1436 16     Temp 01/30/24 1436 97.7 F (36.5 C)     Temp Source 01/30/24 1436 Oral     SpO2 01/30/24 1436 96 %     Weight 01/30/24 1435 178 lb (80.7 kg)     Height --      Head Circumference --      Peak Flow --      Pain Score 01/30/24 1435 5     Pain Loc --      Pain Education --      Exclude from Growth Chart --    No data found.  Updated Vital Signs BP 122/74 (BP Location: Left Arm)   Pulse (!) 45   Temp 97.7 F (36.5 C) (Oral)   Resp 16   Wt 80.7 kg   SpO2 96%   BMI 29.62 kg/m   Visual Acuity Right Eye Distance:   Left Eye Distance:   Bilateral Distance:    Right Eye Near:   Left Eye Near:    Bilateral Near:     Physical Exam GEN: well appearing male in no acute distress  CVS: well perfused  RESP: speaking in full sentences without pause, no respiratory distress  MSK:  No midline lumbar TTP  +lower sacral TTP Hematoma left buttock and ecchymosis of left buttocks extending to posterior thigh  +decreased ROM of left hip though may be decreased at baseline  + walker present in exam room     UC Treatments / Results  Labs (all labs ordered are listed, but only abnormal results are displayed) Labs Reviewed - No data to display  EKG   Radiology DG Sacrum/Coccyx Result Date: 01/30/2024 CLINICAL DATA:  Fall and left hip pain. EXAM: SACRUM AND COCCYX - 2+ VIEW; DG HIP (WITH OR WITHOUT PELVIS) 2-3V LEFT COMPARISON:  None Available. FINDINGS: No acute fracture or dislocation. The bones are osteopenic. Mild bilateral hip arthritic changes. The soft tissues are unremarkable.  IMPRESSION: 1. No acute fracture or dislocation. 2. Mild bilateral hip arthritic changes. Electronically Signed   By: Vanetta Chou M.D.   On: 01/30/2024 16:22   DG Hip Unilat With Pelvis 2-3 Views Left Result Date: 01/30/2024 CLINICAL DATA:  Fall and left hip pain. EXAM: SACRUM AND COCCYX - 2+ VIEW; DG HIP (WITH OR WITHOUT PELVIS) 2-3V LEFT COMPARISON:  None Available. FINDINGS: No acute fracture or dislocation. The bones are osteopenic. Mild bilateral hip arthritic changes. The soft tissues are unremarkable. IMPRESSION: 1. No acute fracture or dislocation. 2. Mild bilateral hip arthritic changes. Electronically Signed   By: Vanetta Chou M.D.   On: 01/30/2024 16:22     Procedures Procedures (including critical care time)  Medications Ordered in UC Medications - No data to display  Initial Impression / Assessment and Plan / UC Course  I have reviewed the triage vital signs and the nursing notes.  Pertinent labs & imaging results that were available during my care of the patient were reviewed by me and considered in my medical decision making (see chart for details).      Pt is a 83 y.o.  male with  left hip and buttock pain after fall about a week ago.  Patient is bradycardic but this is not new.  He  is normotensive, satting well on room air and is afebrile.  On exam, pt has tenderness at left buttock, decreased ROM of left hip and sacral TTP concerning for possible fracture.   Obtained left hip with pelvis and sacral-coccyx  plain films.  Personally interpreted by me were unremarkable for fracture or dislocation.  Age-related changes present.  Radiologist report reviewed and additionally notes mild bilateral hip arthritic changes. Declined pain control here.   Patient to gradually return to normal activities, as tolerated and continue ordinary activities within the limits permitted by pain. Prescribed Robaxin  at bedtime. Tylenol  PRN. Advised patient to avoid OTC NSAIDs while taking  prescription NSAID. Counseled patient on red flag symptoms and when to seek immediate care.    Patient to follow up with orthopedic provider, if symptoms do not improve with conservative treatment.  Return and ED precautions given. Understanding voiced. Discussed MDM, treatment plan and plan for follow-up with patient who agrees with plan.   Final Clinical Impressions(s) / UC Diagnoses   Final diagnoses:  Fall, initial encounter  Left hip pain  Acute buttock pain  Traumatic hematoma of buttock, initial encounter     Discharge Instructions      On my review of your xray images, you did not have any fractures or dislocated bones. The radiologist has not yet read your xray. If it is significantly abnormal or urgent, someone will contact you.  You should see your results in MyChart.   Take the muscle relaxer (methocarbamol /Robaxin ) at bedtime, as needed. Apply warm compresses to help your body break down the blood trapped in the skin.        ED Prescriptions     Medication Sig Dispense Auth. Provider   methocarbamol  (ROBAXIN ) 500 MG tablet Take 1 tablet (500 mg total) by mouth at bedtime as needed for muscle spasms. 20 tablet Wayburn Shaler, DO      PDMP not reviewed this encounter.   Antario Yasuda, DO 01/31/24 1205

## 2024-01-31 NOTE — Progress Notes (Signed)
 Last read by Rodgers MALVA Marina at 8:27AM on 01/31/2024.

## 2024-02-07 ENCOUNTER — Other Ambulatory Visit: Payer: Self-pay

## 2024-02-13 NOTE — Progress Notes (Unsigned)
 Cardiology Clinic Note   Date: 02/15/2024 ID: [name], DOB Nov 03, 1940, MRN [id number]  Primary Cardiologist:  Redell Cave, MD  Chief Complaint   Warren Ingram is a 83 y.o. male who presents to the clinic today for routine follow up.   Patient Profile   Warren Ingram is followed by Dr. Cave for the history outlined below.      Past medical history significant for: CAD. PCI 2010 LHC 07/25/2017 (chest pain): Proximal RCA 100% with collaterals from LCx.  Proximal to mid LAD 95%.  OM2 10%.  Lat OM 2 90% in-stent restenosis.  Mid to distal LCx 90%.  Patent stent to mid LCx into large OM 2.  CT surgery referral. CABG x 4 08/06/2017: LIMA to LAD, SVG to PDA, SVG to D1, SVG to OM1. PAF/a-flutter. Onset postop CABG March 2019. Recurrence 2023. DCCV 10/12/2023. Chronic diastolic heart failure/pulmonary hypertension/aortic valve stenosis. Echo 11/19/2023: EF 55 to 60%.  No RWMA.  Indeterminate diastolic parameters.  Interventricular septum is flattened in diastole consistent with right ventricular volume overload.  Normal RV function.  Mild RVH.  Moderately elevated PA pressure, RVSP 42 mmHg.  Mild to moderate LAE.  Severe RAE.  Mild MR.  Mild to moderate TR.  Mild aortic valve stenosis mean gradient 11 mmHg.  Dilated IVC, RA pressure 15 mmHg. Carotid artery stenosis. Carotid stent placement 11/08/2021: Left ICA. Carotid duplex 09/10/2023: Right ICA 40 to 59%, hemodynamically significant plaque > 50% right CCA.  Widely patent stent left ICA, nonhemodynamically significant plaque <50% left CCA. Hypertension. Hyperlipidemia. Lipid panel 10/10/2023: LDL 78, HDL 25, TG 77, total 118. TIA. GERD. Hypothyroidism. Migraines.  In summary, patient was previously followed by Dr. Bosie and Dr. Florencio at [facility] cardiology.  He has a history of CAD with PCI in 2010.  In February 2019 he underwent LHC for chest pain showing three-vessel CAD.  He underwent CABG x 4 in March 2019.   Postop course complicated by A-fib.  Patient had a TIA in February 2021.  Echo at that time demonstrated EF 55 to 60%, no RWMA, Grade I DD, normal RV size, normal PA pressure, aortic valve normal in structure and function.  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  Holter monitor October 2023 revealed A-fib/flutter average rate 59 bpm.  He was started on Eliquis  for stroke prophylaxis.  He has carotid artery disease followed by vascular surgery.  He had left ICA stent placement in June 2023.  Patient was first evaluated by Dr. Cave on 10/01/2023 to establish care.  EKG revealed A-fib 92 bpm and he was scheduled for DCCV.  He was referred to EP.  Patient was last seen in the office by Dr. Cindie on 11/28/2023 for evaluation of A-fib.  He reported doing well 1 to 2 weeks post cardioversion with clear thinking, getting around faster, and improved breathing.  At the time of his visit with Dr. Cindie he complained of recurrence of symptoms with trouble thinking and poor mobility.  EKG showed A-fib 72 bpm.  Decision was made to pursue rhythm control.  He was started on amiodarone  with plan for cardioversion 6 to 8 weeks following.   Patient was seen in the office on 12/13/2023 for evaluation of edema.  Patient noted mild lower extremity edema after shoulder surgery that progressed up his leg to his abdomen including his scrotal area.  He denies shortness of  breath, orthopnea, PND.  He had been sleeping in a recliner secondary to shoulder surgery but was able to lay all the way back without dyspnea.  He was started on torsemide  by his PCP and noted brisk diuresis at first.  He reported edema below his knees improved but he continued to have edema above his knees to his abdomen.  He was weighing daily.  Normal weight 175 and weight that morning 184.  Patient was bradycardic at 44 bpm  at the time of his visit without lightheadedness, dizziness, presyncope, syncope.  Toprol  was decreased to 50 mg daily.  He was started on Farxiga .  Labs were drawn at the time of his visit and kidney function had worsened.  He was instructed to decrease torsemide  to 20 mg daily and return for recheck of BMP.   Patient was last seen in the office by me on 01/15/2024 for follow-up after medication changes.  He reported slight improvement in edema after starting Farxiga .  He was evaluated by urology for scrotal edema and was told it was caused by amiodarone  and he should stop it.  Patient states that the edema started before starting amiodarone  and he would like to continue it.  Patient stopped torsemide  on 7/30 at the advice of PCP secondary to kidney function.  He reported lower extremity edema below the knee remained well-managed with compression socks however he continued to have edema above his knee and into his scrotum and abdomen.  He reported weight gain, DOE and fatigue but no orthopnea or PND.  He was instructed to restart torsemide  at 20 mg daily.  BNP was slightly elevated.  Kidney function was somewhat improved on repeat labs.     History of Present Illness    Today, patient is accompanied by his wife. He reports edema has improved greatly. He still has scrotal edema that is very slowly improving. Daily weight has been stable. Patient denies shortness of breath, dyspnea on exertion, orthopnea or PND. No chest pain, pressure, or tightness. No palpitations. Patient had 2 falls recently. One time in the bathroom when he thinks he turned too quickly became dizzy and fell. The second time he bent over to pick up something from the floor and fell forward. He was elevated in the ED for hip and buttock pain at the end of August. He reports dizziness particularly if he bends his forward or changes positions quickly. He has started to ambulate most of the time with a rolling walker. Had a long discussion  about obtaining a rollator with a seat so if he becomes dizzy he will always have someplace to sit. His wife agrees with this although patient is not too happy about changing to the rollator.     ROS: All other systems reviewed and are otherwise negative except as noted in History of Present Illness.  EKGs/Labs Reviewed       EKG is not ordered today.   01/30/2024: BUN 31; Creatinine, Ser 1.55; Potassium 4.0; Sodium 137   10/01/2023: Hemoglobin 13.9; WBC 6.6   01/15/2024: BNP 462.6   Risk Assessment/Calculations     CHA2DS2-VASc Score = 7   This indicates a 11.2% annual risk of stroke. The patient's score is based upon: CHF History: 1 HTN History: 1 Diabetes History: 0 Stroke History: 2 Vascular Disease History: 1 Age Score: 2 Gender Score: 0     Physical Exam    VS:  BP (!) 147/70   Pulse (!) 48   Ht 5' 5 (1.651  m)   Wt 187 lb 6.4 oz (85 kg)   SpO2 94%   BMI 31.18 kg/m  , BMI Body mass index is 31.18 kg/m.  GEN: Well nourished, well developed, in no acute distress. Neck: No JVD or carotid bruits. Cardiac:  RRR. 2/6 murmur. No rubs or gallops.   Respiratory:  Respirations regular and unlabored. Clear to auscultation without rales, wheezing or rhonchi. GI: Soft, nontender, nondistended. Extremities: Radials/DP/PT 2+ and equal bilaterally. No clubbing or cyanosis. Mild nonpitting edema bilateral lower extremities.  Skin: Warm and dry, no rash. Neuro: Strength intact.  Assessment & Plan   CAD S/p PCI 2010, CABG x 06 August 2017.  Patient denies chest pain, pressure or tightness.  - Continue Toprol , atorvastatin , fenofibrate .  Patient on Plavix  for history of TIA while on Eliquis .   Chronic diastolic heart failure/pulmonary hypertension/aortic valve stenosis Echo June 2025 demonstrated EF 55 to 60%, interventricular septum is flattened in diastole consistent with right ventricular volume overload, normal RV function, mild RVH, moderately elevated PA pressure, mild  to moderate LAE, severe RAE, mild MR, mild to moderate TR, mild aortic valve stenosis mean gradient 11 mmHg.  Patient reports edema has improved since starting Farxiga . He still has scrotal edema which is very slowly improving. Dyspnea is also improved.  2/6 systolic murmur. Mild nonpitting lower extremity edema otherwise euvolemic and well compensated on exam.  - Continue Toprol , lisinopril , Farxiga , torsemide . - Weigh daily.  - BMP today.   PAF/positional dizziness Initial onset postop CABG March 2019, recurrence 2023.  S/p DCCV May 2025.  Denies spontaneous bleeding concerns.  Patient denies palpitations. Metoprolol  was previously decreased for bradycardia. His heart rate is 48 today. RRR on exam. He has had 2 falls since last visit - once when he bent over to pick up something from the floor and once when turned too quickly.  - Continue Toprol , Eliquis . Appropriate Eliquis  dose. - Decrease amiodarone  to 100 mg daily. MyChart HR readings for 1-2 weeks. May need to decrease Toprol  further.    Carotid artery disease S/p left ICA stent placement June 2023.  Carotid duplex April 2025 showed right ICA 40 to 59%, widely patent stent left ICA.  Patient reports occasional positional lightheadedness particularly if he drops his head down. - Continue atorvastatin , fenofibrate , Plavix . - Continue to follow with vascular surgery.   Hypertension BP today 147/70. He reports positional dizziness if he drops his head down below his knees or turns too quickly. He has fallen twice since last visit.  He was seen in the ED for a fall at the end of August.  - Discussed slow position changes and not dropping head down.  - Continue lisinopril , Toprol .   Hyperlipidemia LDL 78 May 2025, not at goal.  - Continue atorvastatin  and fenofibrate .  Disposition: Decrease amiodarone  to 100 mg daily. MyChart 1-2 weeks of HR readings. BMP today. Return in 3 months or sooner as needed.          Signed, Barnie HERO.  Joell Usman, DNP, NP-C

## 2024-02-14 DIAGNOSIS — G4733 Obstructive sleep apnea (adult) (pediatric): Secondary | ICD-10-CM | POA: Diagnosis not present

## 2024-02-15 ENCOUNTER — Ambulatory Visit: Attending: Student | Admitting: Student

## 2024-02-15 ENCOUNTER — Encounter: Payer: Self-pay | Admitting: Student

## 2024-02-15 VITALS — BP 147/70 | HR 48 | Ht 65.0 in | Wt 187.4 lb

## 2024-02-15 DIAGNOSIS — E785 Hyperlipidemia, unspecified: Secondary | ICD-10-CM

## 2024-02-15 DIAGNOSIS — I10 Essential (primary) hypertension: Secondary | ICD-10-CM | POA: Diagnosis not present

## 2024-02-15 DIAGNOSIS — I5032 Chronic diastolic (congestive) heart failure: Secondary | ICD-10-CM

## 2024-02-15 DIAGNOSIS — I272 Pulmonary hypertension, unspecified: Secondary | ICD-10-CM | POA: Diagnosis not present

## 2024-02-15 DIAGNOSIS — R42 Dizziness and giddiness: Secondary | ICD-10-CM

## 2024-02-15 DIAGNOSIS — I350 Nonrheumatic aortic (valve) stenosis: Secondary | ICD-10-CM | POA: Diagnosis not present

## 2024-02-15 DIAGNOSIS — Z79899 Other long term (current) drug therapy: Secondary | ICD-10-CM | POA: Diagnosis not present

## 2024-02-15 DIAGNOSIS — I6521 Occlusion and stenosis of right carotid artery: Secondary | ICD-10-CM

## 2024-02-15 DIAGNOSIS — R296 Repeated falls: Secondary | ICD-10-CM | POA: Diagnosis not present

## 2024-02-15 DIAGNOSIS — I480 Paroxysmal atrial fibrillation: Secondary | ICD-10-CM | POA: Diagnosis not present

## 2024-02-15 DIAGNOSIS — I25810 Atherosclerosis of coronary artery bypass graft(s) without angina pectoris: Secondary | ICD-10-CM | POA: Diagnosis not present

## 2024-02-15 MED ORDER — AMIODARONE HCL 100 MG PO TABS: 100.0000 mg | ORAL_TABLET | Freq: Every day | ORAL | 3 refills | Status: AC

## 2024-02-15 NOTE — Patient Instructions (Signed)
 Medication Instructions:  Your physician recommends the following medication changes.   DECREASE: Amiodarone  to 100 mg once daily  *If you need a refill on your cardiac medications before your next appointment, please call your pharmacy*  Lab Work: Your provider would like for you to have following labs drawn today [facility].   If you have labs (blood work) drawn today and your tests are completely normal, you will receive your results only by: MyChart Message (if you have MyChart) OR A paper copy in the mail If you have any lab test that is abnormal or we need to change your treatment, we will call you to review the results.  Testing/Procedures: None ordered at this time   Follow-Up: At [facility], you and your health needs are our priority.  As part of our continuing mission to provide you with exceptional heart care, our providers are all part of one team.  This team includes your primary Cardiologist (physician) and Advanced Practice Providers or APPs (Physician Assistants and Nurse Practitioners) who all work together to provide you with the care you need, when you need it.  Your next appointment:   3 month(s)  Provider:   Redell Cave, MD or Barnie Hila, NP    We recommend signing up for the patient portal called MyChart.  Sign up information is provided on this After Visit Summary.  MyChart is used to connect with patients for Virtual Visits (Telemedicine).  Patients are able to view lab/test results, encounter notes, upcoming appointments, etc.  Non-urgent messages can be sent to your provider as well.   To learn more about what you can do with MyChart, go to [email or web address].

## 2024-02-16 ENCOUNTER — Ambulatory Visit: Payer: Self-pay | Admitting: Student

## 2024-02-16 DIAGNOSIS — Z79899 Other long term (current) drug therapy: Secondary | ICD-10-CM

## 2024-02-16 LAB — BASIC METABOLIC PANEL WITH GFR
BUN/Creatinine Ratio: 16 (ref 10–24)
BUN: 23 mg/dL (ref 8–27)
CO2: 23 mmol/L (ref 20–29)
Calcium: 9.3 mg/dL (ref 8.6–10.2)
Chloride: 103 mmol/L (ref 96–106)
Creatinine, Ser: 1.48 mg/dL — ABNORMAL HIGH (ref 0.76–1.27)
Glucose: 104 mg/dL — ABNORMAL HIGH (ref 70–99)
Potassium: 4.3 mmol/L (ref 3.5–5.2)
Sodium: 142 mmol/L (ref 134–144)
eGFR: 47 mL/min/1.73 — ABNORMAL LOW (ref >59)

## 2024-02-18 ENCOUNTER — Other Ambulatory Visit (HOSPITAL_COMMUNITY): Payer: Self-pay

## 2024-02-18 ENCOUNTER — Telehealth: Payer: Self-pay | Admitting: Student

## 2024-02-18 DIAGNOSIS — G4733 Obstructive sleep apnea (adult) (pediatric): Secondary | ICD-10-CM | POA: Diagnosis not present

## 2024-02-18 MED ORDER — TORSEMIDE 20 MG PO TABS: 20.0000 mg | ORAL_TABLET | ORAL | Status: DC

## 2024-02-18 NOTE — Addendum Note (Signed)
 Addended by: TOBIE HOUSTON A on: 02/18/2024 01:38 PM   Modules accepted: Orders

## 2024-02-18 NOTE — Telephone Encounter (Signed)
Patient's wife returned call for lab results.  

## 2024-02-18 NOTE — Telephone Encounter (Signed)
 Called and spoke with the patient's wife (per DPR) to let her know of the recommendation from Barnie Hila, NP of taking the Torsemide  every other day and having the blood work done at the time of the next appointment with Suzann Riddle, NP on 03/17/24.  Adjustment made to medications listing on EPIC and orders put in for BMP for the visit on 03/17/24.  [place] verbalized understanding with all questions and concerns addressed at this time.

## 2024-02-18 NOTE — Telephone Encounter (Signed)
-----   Message from Barnie Hila sent at 02/16/2024 10:47 AM EDT ----- Please let patient know kidney function is still low but slightly better. I would like him to try to take torsemide  every other day. If he sees his weight trending up or an increase in edema he can  go back to daily dosing. I would like to repeat BMP on 10/13 when he sees Suzann, NP.   Thank you!  DW  ----- Message ----- From: Rebecka Memos Lab Results In Sent: 02/16/2024   5:36 AM EDT To: Barnie Hila, NP

## 2024-02-19 ENCOUNTER — Other Ambulatory Visit (HOSPITAL_COMMUNITY): Payer: Self-pay

## 2024-02-26 DIAGNOSIS — R5381 Other malaise: Secondary | ICD-10-CM | POA: Diagnosis not present

## 2024-02-26 DIAGNOSIS — R609 Edema, unspecified: Secondary | ICD-10-CM | POA: Diagnosis not present

## 2024-02-26 DIAGNOSIS — G4719 Other hypersomnia: Secondary | ICD-10-CM | POA: Diagnosis not present

## 2024-02-26 DIAGNOSIS — G4733 Obstructive sleep apnea (adult) (pediatric): Secondary | ICD-10-CM | POA: Diagnosis not present

## 2024-02-26 NOTE — Progress Notes (Signed)
 DIVISION OF PULMONARY AND CRITICAL CARE MEDICINE                              FOLLOW UP ENCOUNTER     Chief complaint: Dyspnea due to interstitial edema and OSA  History of Present Illness Warren Ingram is an 83 year old male with atrial fibrillation and a history of quadruple bypass who presents for pulmonary follow-up. He is accompanied by his spouse.  He has experienced improvement in mobility, transitioning from using a walker to a cane, although he still uses the walker at home for stability, especially when making quick turns, to prevent falls. He has a history of several strokes affecting his balance but can walk to the car and into the clinic without issues.  He has a history of being winded and wheezing due to fluid and congestion, which has improved significantly. He continues to have some fluid but not as much as before. He uses a CPAP machine for sleep apnea, which he finds beneficial, allowing him to sleep up to eight hours without waking. He has adjusted to the CPAP mask and notes improved sleep quality and reduced fatigue.  He takes metoprolol  in the afternoon for atrial fibrillation. He also takes gabapentin, initially prescribed one tablet but has been taking two for neuropathy and sleep. He has been taking gabapentin, initially prescribed one tablet but has been taking two for neuropathy and sleep. He has a history of neuropathy and finds gabapentin helpful for managing symptoms.  He has a history of a quadruple bypass and has participated in cardiac rehab. He has access to a [employer] for exercise.   Past Medical History:   Past Medical History:  Diagnosis Date  . Barrett's esophagus 03/10/2010   06/25/2014 long segment  . Benign neoplasm of colon 08/21/2013  . Coronary atherosclerosis of native coronary artery 08/21/2013  . Diverticulitis of colon (without mention of hemorrhage)(562.11) 08/21/2013  . Esophageal reflux 08/21/2013  .  Essential hypertension, benign 08/21/2013  . Heart murmur   . Hemorrhage of rectum and anus 08/21/2013  . History of stroke   . Osteoarthrosis, unspecified whether generalized or localized, lower leg 08/21/2013  . Osteoarthrosis, unspecified whether generalized or localized, pelvic region and thigh 08/21/2013  . Personal history of colonic polyps 2007, 2011   +TA  . Pure hypercholesterolemia 08/21/2013  . Unspecified hypothyroidism 08/21/2013    Past Surgical History:   Past Surgical History:  Procedure Laterality Date  . Right unicondylar knee arthroplasty. Right 1941/09/27   Dr. Edie  . FLEXIBLE SIGMOIDOSCOPY  04/30/1996   Diverticulosis  . COLONOSCOPY  06/25/2014   Tubular Adenoma/Repeat 64yrs/PYO  . EGD  06/25/2014   Long Segment Barrett's/PYO  . EGD with BARRX  10/28/2015   Barrett's esophagus treated with BARRX/Repeat BARRX in 2 to 3 months/RTE  . CORONARY ARTERY BYPASS GRAFT  08/06/2017  . JOINT REPLACEMENT  11/19/2018  . Left TKA using all cemented biomet Vanguard system with a 70 mm PCR femur, a 75 mm tibial tray with a 10 mm E-poly insert and a 34x 8.5 mm all-poly 3 pegged domed patella Left 12/19/2018   Dr.Poggi   . ARTHROSCOPY KNEE Left   . COLONOSCOPY  12/04/2005,03/10/2010   +TA  . KNEE ARTHROSCOPY    .  STENT PLACEMENT INTRACRANIAL PERCUTANEOUS    . upper endoscopy   12/04/2005, 03/10/2010   + Barrett's  . VASECTOMY    . VASECTOMY      Allergies:  No Known Allergies  Current Medications:   Prior to Admission medications  Medication Sig Taking? Last Dose  acetaminophen  (TYLENOL ) 325 MG tablet Take 500 mg by mouth as needed for Pain Yes Taking  amiodarone  (PACERONE ) 100 MG tablet Take 100 mg by mouth once daily Yes Taking  apixaban  (ELIQUIS ) 5 mg tablet Take 1 tablet (5 mg total) by mouth every 12 (twelve) hours. Yes Taking  atorvastatin  (LIPITOR ) 80 MG tablet take 1 tablet every day Yes Taking  clobetasoL (TEMOVATE) 0.05 % ointment Apply 0.05 % topically once daily as  needed Yes Taking  clopidogreL  (PLAVIX ) 75 mg tablet Take 1 tablet (75 mg total) by mouth once daily Yes Taking  dapagliflozin  propanediol (FARXIGA ) 10 mg tablet Take 10 mg by mouth every morning before breakfast Yes Taking  fenofibrate  nanocrystallized (TRICOR ) 145 MG tablet TAKE 1 TABLET ONE TIME DAILY Yes Taking  gabapentin (NEURONTIN) 300 MG capsule TAKE 1 CAPSULE (300 MG TOTAL) BY MOUTH AT BEDTIME FOR 90 DAYS Yes Taking  levocetirizine (XYZAL) 5 MG tablet Take 1 tablet (5 mg total) by mouth every evening Yes Taking  levothyroxine  (SYNTHROID ) 150 MCG tablet Take 1 tablet (150 mcg total) by mouth daily on an empty stomach with a glass of water  at least 30-60 minutes before breakfast. Yes Taking  lisinopriL  (ZESTRIL ) 20 MG tablet Take 1 tablet (20 mg total) by mouth once daily Yes Taking  metoprolol  SUCCinate (TOPROL -XL) 100 MG XL tablet Take 1 tablet (100 mg total) by mouth once daily Patient taking differently: Take 50 mg by mouth once daily Yes Taking  omega-3 fatty acids/fish oil 340-1,000 mg capsule Take 2 capsules by mouth 2 (two) times daily 1200 mg 2x daily Yes Taking  omeprazole  (PRILOSEC) 20 MG DR capsule TAKE 1 CAPSULE EVERY DAY Yes Taking  silver sulfADIAZINE (SSD) 1 % cream  Yes Taking  tamsulosin  (FLOMAX ) 0.4 mg capsule TAKE 1 CAPSULE EVERY DAY Yes Taking  TORsemide  (DEMADEX ) 20 MG tablet Take 1 tablet (20 mg total) by mouth once daily Yes Taking    Family History:   Family History  Problem Relation Name Age of Onset  . Myocardial Infarction (Heart attack) Mother Mother   . Alzheimer's disease Mother Mother   . Dementia Mother Mother   . Stroke Mother Mother   . Lung disease Father    . Stroke Father    . Diabetes type II Daughter Burnard Don     Social History:   Social History   Socioeconomic History  . Marital status: Married    Spouse name: Rock  . Number of children: 2  . Years of education: 12  Occupational History  . Occupation: Retired- Dietitian  Tobacco Use  . Smoking status: Former    Current packs/day: 1.00    Average packs/day: 1 pack/day for 20.0 years (20.0 ttl pk-yrs)    Types: Cigarettes  . Smokeless tobacco: Former  . Tobacco comments:    Cigarette smoker pre 1980  Vaping Use  . Vaping status: Never Used  Substance and Sexual Activity  . Alcohol use: Yes    Alcohol/week: 4.0 standard drinks of alcohol    Types: 4 Glasses of wine per week    Comment: 4x weekly  . Drug use: No  . Sexual activity: Yes    Partners: Female  Birth control/protection: None   Social Drivers of Health   Financial Resource Strain: Low Risk  (11/12/2023)   Overall Financial Resource Strain (CARDIA)   . Difficulty of Paying Living Expenses: Not hard at all  Food Insecurity: No Food Insecurity (11/12/2023)   Hunger Vital Sign   . Worried About Programme researcher, broadcasting/film/video in the Last Year: Never true   . Ran Out of Food in the Last Year: Never true  Transportation Needs: No Transportation Needs (11/12/2023)   PRAPARE - Transportation   . Lack of Transportation (Medical): No   . Lack of Transportation (Non-Medical): No  Housing Stability: Low Risk  (11/12/2023)   Housing Stability Vital Sign   . Unable to Pay for Housing in the Last Year: No   . Number of Times Moved in the Last Year: 0   . Homeless in the Last Year: No    Review of Systems:   A 10 point review of systems is negative, except for the pertinent positives and negatives detailed in the HPI.  Vitals:   Vitals:   02/26/24 1022  BP: (!) 152/69  BP Location: Right upper arm  Patient Position: Sitting  BP Cuff Size: Adult  Pulse: (!) 49  SpO2: 96%  Weight: 79.4 kg (175 lb)  Height: 162.6 cm (5' 4)     Body mass index is 30.04 kg/m.  Physical Exam:   Physical Exam Vitals and nursing note reviewed.  Constitutional:      General: in no acute distress.    Appearance: Normal appearance. Is not ill-appearing, toxic-appearing or diaphoretic.  HENT:     Head:  Normocephalic and atraumatic.     Right Ear: External ear normal.     Left Ear: External ear normal.  Eyes:     General:        Right eye: No discharge.        Left eye: No discharge.     Extraocular Movements: Extraocular movements intact.     Pupils: Pupils are equal, round, and reactive to light.  Cardiovascular:     Rate and Rhythm: Normal rate and regular rhythm.     Pulses: Normal pulses.     Heart sounds: Normal heart sounds. No murmur heard.    No friction rub. No gallop.  Abdominal:     General: Bowel sounds are normal.  Skin:    General: Skin is warm and dry.     Capillary Refill: Capillary refill takes less than 2 seconds.  Neurological:     Mental Status: Patient is alert.     Lab and Imaging Results:   Results     Assessment and Plan:   Diagnoses and all orders for this visit:  Edema, unspecified type  OSA on CPAP  Excessive daytime sleepiness  Physical deconditioning    Assessment & Plan Dyspnea on exertion with peripheral edema and hypoxemia Improvement in dyspnea and peripheral edema. Previously had fluid and congestion contributing to wheezing, which has improved. CPAP use is aiding in better oxygenation and reducing fatigue. - Encourage continued use of CPAP.  Obstructive sleep apnea Managed with CPAP. Reports improved sleep quality and duration with CPAP use, contributing to better oxygenation and reduced fatigue. - Continue CPAP therapy.  Atrial fibrillation Managed with metoprolol . Metoprolol  causes fatigue, which is exacerbated by the combination with gabapentin. - Continue metoprolol  as prescribed.  Peripheral neuropathy Previously managed with gabapentin, which contributes to fatigue and balance issues. Currently taking two 300 mg doses. - Discontinue gabapentin  if possible.  Exercise and fatigue management Discussion about the benefits of exercise to improve fatigue and overall health. - Encourage use of elliptical machine at  [facility] for exercise.     I spent a total of 41 minutes in both face-to-face and non-face-to-face activities, excluding procedures performed, for this visit on the date of this encounter.   This note has been created using dictation software tool and any typographical errors are purely unintentional.  Patient received an After Visit Summary

## 2024-02-28 ENCOUNTER — Ambulatory Visit: Admitting: Cardiology

## 2024-03-06 DIAGNOSIS — D2262 Melanocytic nevi of left upper limb, including shoulder: Secondary | ICD-10-CM | POA: Diagnosis not present

## 2024-03-06 DIAGNOSIS — D2272 Melanocytic nevi of left lower limb, including hip: Secondary | ICD-10-CM | POA: Diagnosis not present

## 2024-03-06 DIAGNOSIS — D225 Melanocytic nevi of trunk: Secondary | ICD-10-CM | POA: Diagnosis not present

## 2024-03-06 DIAGNOSIS — D2261 Melanocytic nevi of right upper limb, including shoulder: Secondary | ICD-10-CM | POA: Diagnosis not present

## 2024-03-06 DIAGNOSIS — L570 Actinic keratosis: Secondary | ICD-10-CM | POA: Diagnosis not present

## 2024-03-06 DIAGNOSIS — Z85828 Personal history of other malignant neoplasm of skin: Secondary | ICD-10-CM | POA: Diagnosis not present

## 2024-03-12 ENCOUNTER — Other Ambulatory Visit (HOSPITAL_COMMUNITY): Payer: Self-pay

## 2024-03-16 NOTE — Progress Notes (Unsigned)
 Electrophysiology Clinic Note    Date:  03/17/2024  Patient ID:  Warren, Ingram [birth year], MRN [id number] PCP:  Auston Reyes BIRCH, MD  Cardiologist:  Redell Cave, MD  Cardiology APP:  Loistine Sober, NP  Electrophysiologist:  OLE ONEIDA HOLTS, MD  Electrophysiology APP:  Sanii Kukla, NP     Discussed the use of AI scribe software for clinical note transcription with the patient, who gave verbal consent to proceed.   Patient Profile    Chief Complaint: AFib follow-up  History of Present Illness: Warren Ingram is a 83 y.o. male with PMH notable for persis AFib, CAD s/p CABG, pulmHTN, carotid stenosis s/p stenting, HTN, T2DM, hypothyroid, TIA; seen today for OLE ONEIDA HOLTS, MD for routine electrophysiology followup.   He last saw Dr. HOLTS 11/2023 for initial EP evaluation of AFib. He had DCCV 10/2023 after which his thinking was clearer and he had less SOB. He converted back to AFib 1-2 weeks after DCCV, and had returned to his usual symptoms at the appt with Dr. HOLTS. Amiodarone  was started, with plans for DCCV in 6-8 wks later. He saw NP [place] 12/2023 where he was in sinus rhythm. He has seen her regularly since that time adjusting GDMT and diuretics.   On follow-up today, his edema is significantly improved, no longer has scrotal edema. He is minimal active, but denies SOB, dizziness, LH when walking. He is not aware of any further AFib episodes.  He continues to try to use CPAP throughout the night, usually makes it about 4 hours before the face mask leaks and he removes it. He falls asleep quickly with CPAP mask on and does believe it is helping.  He has a history of falls, trips over items on the floor. He has been more careful lately and has not experienced any recent falls.   He continues to take eliquis  BID, no significant bleeding. He does have cuts to bilateral forearms.      Arrhythmia/Device History Amiodarone      ROS:   Please see the history of present illness. All other systems are reviewed and otherwise negative.    Physical Exam    VS:  BP 118/68 (BP Location: Left Arm, Patient Position: Sitting, Cuff Size: Normal)   Pulse (!) 46   Wt 171 lb (77.6 kg)   SpO2 98%   BMI 28.46 kg/m  BMI: Body mass index is 28.46 kg/m.  Orthostatic VS for the past 24 hrs (Last 3 readings):  BP- Lying Pulse- Lying BP- Sitting Pulse- Sitting BP- Standing at 0 minutes Pulse- Standing at 0 minutes BP- Standing at 3 minutes Pulse- Standing at 3 minutes  03/17/24 1018 122/72 (!) 47 142/74 54 155/75 (!) 48 161/78 (!) 48          Wt Readings from Last 3 Encounters:  03/17/24 171 lb (77.6 kg)  02/15/24 187 lb 6.4 oz (85 kg)  01/30/24 178 lb (80.7 kg)     GEN- The patient is well appearing, alert and oriented x 3 today.   Lungs- Clear to ausculation bilaterally, normal work of breathing.  Heart- Regular rate and rhythm, 3/6 murmur, rubs or gallops Extremities- Trace peripheral edema, warm, dry   Studies Reviewed   Previous EP, cardiology notes.    EKG is ordered. Personal review of EKG from today shows:    EKG Interpretation Date/Time:  Monday March 17 2024 10:15:04 EDT Ventricular Rate:  46 PR Interval:  184 QRS Duration:  154 QT  Interval:  532 QTC Calculation: 465 R Axis:   30  Text Interpretation: Sinus bradycardia Right bundle branch block T wave abnormality, consider inferolateral ischemia Confirmed by Paije Goodhart [id number] on 03/17/2024 10:24:11 AM    TTE, 11/19/2023  1. Left ventricular ejection fraction, by estimation, is 55 to 60%. The left ventricle has normal function. The left ventricle has no regional wall motion abnormalities. Left ventricular diastolic parameters are indeterminate. There is the interventricular septum is flattened in diastole ('D' shaped left ventricle), consistent with right ventricular volume overload.   2. Right ventricular systolic function is normal. The right  ventricular size is mildly enlarged. There is moderately elevated pulmonary artery systolic pressure. The estimated right ventricular systolic pressure is 42.0 mmHg.   3. Left atrial size was mild to moderately dilated.   4. Right atrial size was severely dilated.   5. The mitral valve is normal in structure. Mild mitral valve regurgitation.   6. The tricuspid valve is degenerative. Tricuspid valve regurgitation is mild to moderate.   7. Mean Aov gradient , peak gradient , . The aortic valve is calcified. Aortic valve regurgitation is not visualized. Mild aortic valve stenosis.   8. The inferior vena cava is dilated in size with <50% respiratory variability, suggesting right atrial pressure of 15 mmHg.      Assessment and Plan     #) persis AFib #) amiodarone  monitoring #) bradycardia Maintaining sinus rhythm on 100mg  amiodarone  daily Continues to be bradycardic, but is asymptomatic without dizziness, LH, SOB with activity, and overall feeling better Will continue 50mg  toprol  daily Update LFT, thyroid  labs today   #) Hypercoag d/t afib CHA2DS2-VASc Score = at least 7 [CHF History: 1, HTN History: 1, Diabetes History: 0, Stroke History: 2, Vascular Disease History: 1, Age Score: 2, Gender Score: 0].  Therefore, the patient's annual risk of stroke is 11.2 %.    Stroke ppx - 5mg  eliquis  BID, appropriately dosed. Though carefully watching Cr as it is nearing 1.5 which would require dose reduction No bleeding concerns   #) HFpEF Significant improvement in edema and functional ability Continue 10mg  farxiga , 20mg  lisinopril , 50mg  toprol , 20mg  torsemide  every other day    #) OSA on CPAP Encouraged nightly usage, consider mask adjustment if continues to have leaks throughout the night     Current medicines are reviewed at length with the patient today.   The patient does not have concerns regarding his medicines.  The following changes were made today:  none  Labs/ tests  ordered today include:  Orders Placed This Encounter  Procedures   Hepatic function panel   TSH   T4, free   EKG 12-Lead     Disposition: Follow up with Dr. Cindie or EP APP in 6 months   Signed, Chantal Needle, NP  03/17/24  11:57 AM  Electrophysiology [facility]

## 2024-03-17 ENCOUNTER — Encounter: Payer: Self-pay | Admitting: Cardiology

## 2024-03-17 ENCOUNTER — Ambulatory Visit: Attending: Cardiology | Admitting: Cardiology

## 2024-03-17 VITALS — BP 118/68 | HR 46 | Wt 171.0 lb

## 2024-03-17 DIAGNOSIS — G4733 Obstructive sleep apnea (adult) (pediatric): Secondary | ICD-10-CM

## 2024-03-17 DIAGNOSIS — I4819 Other persistent atrial fibrillation: Secondary | ICD-10-CM

## 2024-03-17 DIAGNOSIS — D6869 Other thrombophilia: Secondary | ICD-10-CM | POA: Diagnosis not present

## 2024-03-17 DIAGNOSIS — R001 Bradycardia, unspecified: Secondary | ICD-10-CM | POA: Diagnosis not present

## 2024-03-17 DIAGNOSIS — I5032 Chronic diastolic (congestive) heart failure: Secondary | ICD-10-CM | POA: Diagnosis not present

## 2024-03-17 DIAGNOSIS — Z5181 Encounter for therapeutic drug level monitoring: Secondary | ICD-10-CM

## 2024-03-17 DIAGNOSIS — Z79899 Other long term (current) drug therapy: Secondary | ICD-10-CM

## 2024-03-17 NOTE — Patient Instructions (Signed)
 Medication Instructions:  Your physician recommends that you continue on your current medications as directed. Please refer to the Current Medication list given to you today.    *If you need a refill on your cardiac medications before your next appointment, please call your pharmacy*  Lab Work: Your provider would like for you to have following labs drawn today (LFT, TSH, T4).     Testing/Procedures: No test ordered today   Follow-Up: At [facility], you and your health needs are our priority.  As part of our continuing mission to provide you with exceptional heart care, our providers are all part of one team.  This team includes your primary Cardiologist (physician) and Advanced Practice Providers or APPs (Physician Assistants and Nurse Practitioners) who all work together to provide you with the care you need, when you need it.  Your next appointment:   6 month(s)  Provider:   Suzann Riddle, NP

## 2024-03-18 ENCOUNTER — Ambulatory Visit: Payer: Self-pay | Admitting: Cardiology

## 2024-03-18 LAB — HEPATIC FUNCTION PANEL
ALT: 26 IU/L (ref 0–44)
AST: 33 IU/L (ref 0–40)
Albumin: 4.3 g/dL (ref 3.7–4.7)
Alkaline Phosphatase: 170 IU/L — ABNORMAL HIGH (ref 48–129)
Bilirubin Total: 0.8 mg/dL (ref 0.0–1.2)
Bilirubin, Direct: 0.4 mg/dL (ref 0.00–0.40)
Total Protein: 7.4 g/dL (ref 6.0–8.5)

## 2024-03-18 LAB — T4, FREE: Free T4: 1.79 ng/dL — ABNORMAL HIGH (ref 0.82–1.77)

## 2024-03-18 LAB — TSH: TSH: 4.54 u[IU]/mL — ABNORMAL HIGH (ref 0.450–4.500)

## 2024-04-03 ENCOUNTER — Other Ambulatory Visit: Payer: Self-pay

## 2024-04-03 ENCOUNTER — Other Ambulatory Visit (HOSPITAL_COMMUNITY): Payer: Self-pay

## 2024-04-03 DIAGNOSIS — N1832 Chronic kidney disease, stage 3b: Secondary | ICD-10-CM | POA: Diagnosis not present

## 2024-04-03 DIAGNOSIS — I480 Paroxysmal atrial fibrillation: Secondary | ICD-10-CM | POA: Diagnosis not present

## 2024-04-03 DIAGNOSIS — E7800 Pure hypercholesterolemia, unspecified: Secondary | ICD-10-CM | POA: Diagnosis not present

## 2024-04-03 DIAGNOSIS — E039 Hypothyroidism, unspecified: Secondary | ICD-10-CM | POA: Diagnosis not present

## 2024-04-03 DIAGNOSIS — Z23 Encounter for immunization: Secondary | ICD-10-CM | POA: Diagnosis not present

## 2024-04-03 DIAGNOSIS — I10 Essential (primary) hypertension: Secondary | ICD-10-CM | POA: Diagnosis not present

## 2024-04-03 DIAGNOSIS — R7303 Prediabetes: Secondary | ICD-10-CM | POA: Diagnosis not present

## 2024-04-03 DIAGNOSIS — Z79899 Other long term (current) drug therapy: Secondary | ICD-10-CM | POA: Diagnosis not present

## 2024-04-03 MED ORDER — TAMSULOSIN HCL 0.4 MG PO CAPS
0.4000 mg | ORAL_CAPSULE | Freq: Every day | ORAL | 3 refills | Status: AC
  Filled 2024-04-14 – 2024-05-19 (×2): qty 90, 90d supply, fill #0

## 2024-04-03 MED ORDER — FENOFIBRATE 145 MG PO TABS
145.0000 mg | ORAL_TABLET | Freq: Every day | ORAL | 3 refills | Status: AC
  Filled 2024-04-03: qty 90, 90d supply, fill #0

## 2024-04-03 MED ORDER — OMEPRAZOLE 20 MG PO CPDR
20.0000 mg | DELAYED_RELEASE_CAPSULE | Freq: Every day | ORAL | 3 refills | Status: AC
  Filled 2024-04-03: qty 90, 90d supply, fill #0

## 2024-04-03 MED ORDER — ATORVASTATIN CALCIUM 80 MG PO TABS
80.0000 mg | ORAL_TABLET | Freq: Every day | ORAL | 3 refills | Status: AC
  Filled 2024-04-14 – 2024-05-19 (×2): qty 90, 90d supply, fill #0

## 2024-04-03 MED ORDER — CLOPIDOGREL BISULFATE 75 MG PO TABS
75.0000 mg | ORAL_TABLET | Freq: Every day | ORAL | 3 refills | Status: AC
  Filled 2024-04-14 – 2024-05-19 (×2): qty 90, 90d supply, fill #0

## 2024-04-03 NOTE — Progress Notes (Signed)
 Warren Ingram is a  83 y.o. male who presents for  CHIEF COMPLAINT Chief Complaint  Patient presents with  . Follow-up  . Hypertension  . Hyperlipidemia  . Hypothyroidism  . prediabetes    Subjective: History of Present Illness  Pt in NAD. HTN stable on meds. Has HLD on statin, thyroid  dz on Synthroid  and prediabetes not on meds. Also with PAF on Eliquis  and GERD on PPI. Weight stable. Pushing fluids. Not sleeping well with CPAP, using only 4 hours per night. No fever or HA's. Denies CP or SOB. No palpitations. No change in bowels or bladder.    Past Medical History:  Diagnosis Date  . Barrett's esophagus 03/10/2010   06/25/2014 long segment  . Benign neoplasm of colon 08/21/2013  . Coronary atherosclerosis of native coronary artery 08/21/2013  . Diverticulitis of colon (without mention of hemorrhage)(562.11) 08/21/2013  . Esophageal reflux 08/21/2013  . Essential hypertension, benign 08/21/2013  . Heart murmur   . Hemorrhage of rectum and anus 08/21/2013  . History of stroke   . Osteoarthrosis, unspecified whether generalized or localized, lower leg 08/21/2013  . Osteoarthrosis, unspecified whether generalized or localized, pelvic region and thigh 08/21/2013  . Personal history of colonic polyps 2007, 2011   +TA  . Pure hypercholesterolemia 08/21/2013  . Unspecified hypothyroidism 08/21/2013   Patient Active Problem List  Diagnosis  . Coronary atherosclerosis of native coronary artery  . Degenerative arthritis of right knee  . Benign neoplasm of colon  . Essential hypertension, benign  . Hemorrhage of rectum and anus  . Esophageal reflux  . Pure hypercholesterolemia  . Osteoarthrosis, unspecified whether generalized or localized, pelvic region and thigh  . Acquired hypothyroidism  . Diverticulitis of colon (without mention of hemorrhage)(562.11)  . Adenomatous colon polyp  . 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  Obesity (BMI 30-39.9), unspecified  . Barrett's esophagus  . Hematochezia  . Status post total knee replacement using cement, left  . Status post right partial knee replacement  . Acute CVA (cerebrovascular accident) (CMS/HHS-HCC)  . Lacunar stroke (CMS/HHS-HCC)  . Nontraumatic complete tear of left rotator cuff  . Rotator cuff tendinitis, left  . Injury of tendon of long head of left biceps  . Paroxysmal atrial fibrillation (CMS/HHS-HCC)  . Paresthesia  . Nonrheumatic aortic valve stenosis  . Nonrheumatic tricuspid valve regurgitation  . Rotator cuff tendinitis, right  . Nontraumatic complete tear of right rotator cuff  . Primary osteoarthritis of right shoulder  . Prediabetes  . Contusion of left knee  . History of CVA (cerebrovascular accident)    Past Surgical History:  Procedure Laterality Date  . Right unicondylar knee arthroplasty. Right 05/13/1942   Dr. Edie  . FLEXIBLE SIGMOIDOSCOPY  04/30/1996   Diverticulosis  . COLONOSCOPY  06/25/2014   Tubular Adenoma/Repeat 58yrs/PYO  . EGD  06/25/2014   Long Segment Barrett's/PYO  . EGD with BARRX  10/28/2015   Barrett's esophagus treated with BARRX/Repeat BARRX in 2 to 3 months/RTE  . CORONARY ARTERY BYPASS GRAFT  08/06/2017  . JOINT REPLACEMENT  11/19/2018  . Left TKA using all cemented biomet [employer] system with a 70 mm PCR femur, a 75 mm tibial tray with a 10 mm E-poly insert and a 34x 8.5 mm all-poly 3 pegged domed patella Left 12/19/2018   Dr.Poggi   . ARTHROSCOPY KNEE Left   . COLONOSCOPY  12/04/2005,03/10/2010   +TA  . KNEE ARTHROSCOPY    . STENT PLACEMENT INTRACRANIAL  PERCUTANEOUS    . upper endoscopy   12/04/2005, 03/10/2010   + Barrett's  . VASECTOMY    . VASECTOMY       Current Outpatient Medications:  .  acetaminophen  (TYLENOL ) 325 MG tablet, Take 500 mg by mouth as needed for Pain, Disp: , Rfl:  .  amiodarone  (PACERONE ) 100 MG tablet, Take 100 mg by mouth once daily, Disp: , Rfl:  .  apixaban  (ELIQUIS ) 5 mg  tablet, Take 1 tablet (5 mg total) by mouth every 12 (twelve) hours., Disp: 180 tablet, Rfl: 1 .  atorvastatin  (LIPITOR ) 80 MG tablet, take 1 tablet every day, Disp: 90 tablet, Rfl: 3 .  clobetasoL (TEMOVATE) 0.05 % ointment, Apply 0.05 % topically once daily as needed, Disp: , Rfl:  .  clopidogreL  (PLAVIX ) 75 mg tablet, Take 1 tablet (75 mg total) by mouth once daily, Disp: 90 tablet, Rfl: 3 .  dapagliflozin  propanediol (FARXIGA ) 10 mg tablet, Take 10 mg by mouth every morning before breakfast, Disp: , Rfl:  .  fenofibrate  nanocrystallized (TRICOR ) 145 MG tablet, TAKE 1 TABLET ONE TIME DAILY, Disp: 90 tablet, Rfl: 3 .  levocetirizine (XYZAL) 5 MG tablet, Take 1 tablet (5 mg total) by mouth every evening, Disp: 30 tablet, Rfl: 11 .  levothyroxine  (SYNTHROID ) 150 MCG tablet, Take 1 tablet (150 mcg total) by mouth daily on an empty stomach with a glass of water  at least 30-60 minutes before breakfast., Disp: 90 tablet, Rfl: 3 .  lisinopriL  (ZESTRIL ) 20 MG tablet, Take 1 tablet (20 mg total) by mouth once daily, Disp: 30 tablet, Rfl: 11 .  metoprolol  SUCCinate (TOPROL -XL) 50 MG XL tablet, Take 50 mg by mouth once daily, Disp: , Rfl:  .  omega-3 fatty acids/fish oil 340-1,000 mg capsule, Take 2 capsules by mouth 2 (two) times daily 1200 mg 2x daily, Disp: , Rfl:  .  omeprazole  (PRILOSEC) 20 MG DR capsule, TAKE 1 CAPSULE EVERY DAY, Disp: 90 capsule, Rfl: 3 .  silver sulfADIAZINE (SSD) 1 % cream, , Disp: , Rfl:  .  tamsulosin  (FLOMAX ) 0.4 mg capsule, TAKE 1 CAPSULE EVERY DAY, Disp: 90 capsule, Rfl: 3 .  TORsemide  (DEMADEX ) 20 MG tablet, Take 1 tablet (20 mg total) by mouth once daily, Disp: 30 tablet, Rfl: 11 .  triamcinolone  0.1 % ointment, , Disp: , Rfl:   Patient has no known allergies.  Social History   Socioeconomic History  . Marital status: Married    Spouse name: Rock  . Number of children: 2  . Years of education: 12  Occupational History  . Occupation: Retired- Theatre Stage Manager   Tobacco Use  . Smoking status: Former    Current packs/day: 1.00    Average packs/day: 1 pack/day for 20.0 years (20.0 ttl pk-yrs)    Types: Cigarettes  . Smokeless tobacco: Former  . Tobacco comments:    Cigarette smoker pre 1980  Vaping Use  . Vaping status: Never Used  Substance and Sexual Activity  . Alcohol use: Yes    Alcohol/week: 4.0 standard drinks of alcohol    Types: 4 Glasses of wine per week    Comment: 4x weekly  . Drug use: No  . Sexual activity: Yes    Partners: Female    Birth control/protection: None   Social Drivers of Health   Financial Resource Strain: Low Risk  (11/12/2023)   Overall Financial Resource Strain (CARDIA)   . Difficulty of Paying Living Expenses: Not hard at all  Food Insecurity: No Food Insecurity (  11/12/2023)   Hunger Vital Sign   . Worried About Programme Researcher, Broadcasting/film/video in the Last Year: Never true   . Ran Out of Food in the Last Year: Never true  Transportation Needs: No Transportation Needs (11/12/2023)   PRAPARE - Transportation   . Lack of Transportation (Medical): No   . Lack of Transportation (Non-Medical): No  Housing Stability: Low Risk  (11/12/2023)   Housing Stability Vital Sign   . Unable to Pay for Housing in the Last Year: No   . Number of Times Moved in the Last Year: 0   . Homeless in the Last Year: No    Family History  Problem Relation Name Age of Onset  . Myocardial Infarction (Heart attack) Mother Mother   . Alzheimer's disease Mother Mother   . Dementia Mother Mother   . Stroke Mother Mother   . Lung disease Father    . Stroke Father    . Diabetes type II Daughter Burnard Don     A comprehensive ROS was negative except for HPI  PE: BP 116/78   Pulse 57   Ht 162.6 cm (5' 4)   Wt 78 kg (172 lb)   SpO2 99%   BMI 29.52 kg/m  General. Alert oriented x3  Eyes. Sclera and conjunctiva clear; pupils equal round and reactive to light and accommodation; extraocular movements intact Nose. Mucosa healthy without  drainage or ulceration Oropharynx. No suspicious lesions Neck. No swelling, masses, stiffness, pain, limited movement, carotid pulses normal bilaterally, thyroid  normal size, no masses palpated.  No bruits Lungs. Respirations unlabored; clear to auscultation bilaterally Back. No spinal deformity Cardiovascular. Heart regular rate and rhythm with 2/6 systolic murmur noted. No gallops, or rubs Abdomen. Soft; non tender; non distended; normoactive bowel sounds; no masses or organomegaly Lymph Nodes. No significant cervical, supraclavicular, axillary or inguinal lymphadenopathy noted Musculoskeletal. No deformities; no active joint inflammation Extremities. Normal, no edema Pulses. Dorsalis pedis palpable and symmetric bilaterally Neurologic. Alert and oriented; speech intact; face symmetrical; moves all extremities well  Office Visit on 01/02/2024  Component Date Value Ref Range Status  . WBC (White Blood Cell Count) 01/02/2024 6.1  4.1 - 10.2 10^3/uL Final  . RBC (Red Blood Cell Count) 01/02/2024 4.48 (L)  4.69 - 6.13 10^6/uL Final  . Hemoglobin 01/02/2024 12.4 (L)  14.1 - 18.1 gm/dL Final  . Hematocrit 92/69/7974 38.0 (L)  40.0 - 52.0 % Final  . MCV (Mean Corpuscular Volume) 01/02/2024 84.8  80.0 - 100.0 fl Final  . MCH (Mean Corpuscular Hemoglobin) 01/02/2024 27.7  27.0 - 31.2 pg Final  . MCHC (Mean Corpuscular Hemoglobin * 01/02/2024 32.6  32.0 - 36.0 gm/dL Final  . Platelet Count 01/02/2024    Final  . RDW-CV (Red Cell Distribution Widt* 01/02/2024 19.1 (H)  11.6 - 14.8 % Final  . Neutrophils 01/02/2024 4.50  1.50 - 7.80 10^3/uL Final  . Lymphocytes 01/02/2024 0.91 (L)  1.00 - 3.60 10^3/uL Final  . Monocytes 01/02/2024 0.56  0.00 - 1.50 10^3/uL Final  . Eosinophils 01/02/2024 0.12  0.00 - 0.55 10^3/uL Final  . Basophils 01/02/2024 0.02  0.00 - 0.09 10^3/uL Final  . Neutrophil % 01/02/2024 73.5 (H)  32.0 - 70.0 % Final  . Lymphocyte % 01/02/2024 14.8  10.0 - 50.0 % Final  . Monocyte %  01/02/2024 9.1  4.0 - 13.0 % Final  . Eosinophil % 01/02/2024 2.0  1.0 - 5.0 % Final  . Basophil% 01/02/2024 0.3  0.0 - 2.0 %  Final  . Immature Granulocyte % 01/02/2024 0.3  <=0.7 % Final  . Immature Granulocyte Count 01/02/2024 0.02  <=0.06 10^3/L Final  . Glucose 01/02/2024 107  70 - 110 mg/dL Final  . Sodium 92/69/7974 140  136 - 145 mmol/L Final  . Potassium 01/02/2024 4.0  3.6 - 5.1 mmol/L Final  . Chloride 01/02/2024 101  97 - 109 mmol/L Final  . Carbon Dioxide (CO2) 01/02/2024 32.5 (H)  22.0 - 32.0 mmol/L Final  . Urea Nitrogen (BUN) 01/02/2024 28 (H)  7 - 25 mg/dL Final  . Creatinine 92/69/7974 1.7 (H)  0.7 - 1.3 mg/dL Final  . Glomerular Filtration Rate (eGFR) 01/02/2024 40 (L)  >60 mL/min/1.73sq m Final  . Calcium  01/02/2024 9.4  8.7 - 10.3 mg/dL Final  . AST  92/69/7974 28  8 - 39 U/L Final  . ALT  01/02/2024 21  6 - 57 U/L Final  . Alk Phos (alkaline Phosphatase) 01/02/2024 139 (H)  34 - 104 U/L Final  . Albumin 01/02/2024 4.0  3.5 - 4.8 g/dL Final  . Bilirubin, Total 01/02/2024 1.0  0.3 - 1.2 mg/dL Final  . Protein, Total 01/02/2024 6.9  6.1 - 7.9 g/dL Final  . A/G Ratio 92/69/7974 1.4  1.0 - 5.0 gm/dL Final  . Thyroid  Stimulating Hormone (TSH) 01/02/2024 7.724 (H)  0.450-5.330 uIU/ml uIU/mL Final  Appointment on 10/10/2023  Component Date Value Ref Range Status  . Cholesterol, Total 10/10/2023 118  100 - 200 mg/dL Final  . Triglyceride 94/92/7974 77  35 - 199 mg/dL Final  . HDL (High Density Lipoprotein) Cho* 10/10/2023 24.7 (L)  29.0 - 71.0 mg/dL Final  . LDL Calculated 10/10/2023 78  0 - 130 mg/dL Final  . VLDL Cholesterol 10/10/2023 15  mg/dL Final  . Cholesterol/HDL Ratio 10/10/2023 4.8   Final  . WBC (White Blood Cell Count) 10/10/2023 6.1  4.1 - 10.2 10^3/uL Final  . RBC (Red Blood Cell Count) 10/10/2023 4.76  4.69 - 6.13 10^6/uL Final  . Hemoglobin 10/10/2023 14.4  14.1 - 18.1 gm/dL Final  . Hematocrit 94/92/7974 43.8  40.0 - 52.0 % Final  . MCV (Mean  Corpuscular Volume) 10/10/2023 92.0  80.0 - 100.0 fl Final  . MCH (Mean Corpuscular Hemoglobin) 10/10/2023 30.3  27.0 - 31.2 pg Final  . MCHC (Mean Corpuscular Hemoglobin * 10/10/2023 32.9  32.0 - 36.0 gm/dL Final  . Platelet Count 10/10/2023 221  150 - 450 10^3/uL Final  . RDW-CV (Red Cell Distribution Widt* 10/10/2023 15.4 (H)  11.6 - 14.8 % Final  . MPV (Mean Platelet Volume) 10/10/2023 12.0  9.4 - 12.4 fl Final  . Neutrophils 10/10/2023 3.98  1.50 - 7.80 10^3/uL Final  . Lymphocytes 10/10/2023 1.32  1.00 - 3.60 10^3/uL Final  . Monocytes 10/10/2023 0.63  0.00 - 1.50 10^3/uL Final  . Eosinophils 10/10/2023 0.17  0.00 - 0.55 10^3/uL Final  . Basophils 10/10/2023 0.03  0.00 - 0.09 10^3/uL Final  . Neutrophil % 10/10/2023 64.7  32.0 - 70.0 % Final  . Lymphocyte % 10/10/2023 21.5  10.0 - 50.0 % Final  . Monocyte % 10/10/2023 10.3  4.0 - 13.0 % Final  . Eosinophil % 10/10/2023 2.8  1.0 - 5.0 % Final  . Basophil% 10/10/2023 0.5  0.0 - 2.0 % Final  . Immature Granulocyte % 10/10/2023 0.2  <=0.7 % Final  . Immature Granulocyte Count 10/10/2023 0.01  <=0.06 10^3/L Final  . Glucose 10/10/2023 121 (H)  70 - 110 mg/dL Final  .  Sodium 10/10/2023 140  136 - 145 mmol/L Final  . Potassium 10/10/2023 3.6  3.6 - 5.1 mmol/L Final  . Chloride 10/10/2023 101  97 - 109 mmol/L Final  . Carbon Dioxide (CO2) 10/10/2023 28.6  22.0 - 32.0 mmol/L Final  . Urea Nitrogen (BUN) 10/10/2023 16  7 - 25 mg/dL Final  . Creatinine 94/92/7974 1.0  0.7 - 1.3 mg/dL Final  . Glomerular Filtration Rate (eGFR) 10/10/2023 76  >60 mL/min/1.73sq m Final  . Calcium  10/10/2023 9.0  8.7 - 10.3 mg/dL Final  . AST  94/92/7974 21  8 - 39 U/L Final  . ALT  10/10/2023 17  6 - 57 U/L Final  . Alk Phos (alkaline Phosphatase) 10/10/2023 146 (H)  34 - 104 U/L Final  . Albumin 10/10/2023 4.0  3.5 - 4.8 g/dL Final  . Bilirubin, Total 10/10/2023 1.0  0.3 - 1.2 mg/dL Final  . Protein, Total 10/10/2023 6.9  6.1 - 7.9 g/dL Final  . A/G Ratio  94/92/7974 1.4  1.0 - 5.0 gm/dL Final  . PSA (Prostate Specific Antigen), T* 10/10/2023 1.69  0.10 - 4.00 ng/mL Final  . Thyroid  Stimulating Hormone (TSH) 10/10/2023 7.313 (H)  0.450-5.330 uIU/ml uIU/mL Final  . Color 10/10/2023 Yellow  Colorless, Straw, Light Yellow, Yellow, Dark Yellow Final  . Clarity 10/10/2023 Clear  Clear Final  . Specific Gravity 10/10/2023 1.015  1.000 - 1.030 Final  . pH, Urine 10/10/2023 5.5  5.0 - 8.0 Final  . Protein, Urinalysis 10/10/2023 30 (!)  Negative, Trace mg/dL Final  . Glucose, Urinalysis 10/10/2023 Negative  Negative mg/dL Final  . Ketones, Urinalysis 10/10/2023 Negative  Negative mg/dL Final  . Blood, Urinalysis 10/10/2023 Negative  Negative Final  . Nitrite, Urinalysis 10/10/2023 Negative  Negative Final  . Leukocyte Esterase, Urinalysis 10/10/2023 Negative  Negative Final  . White Blood Cells, Urinalysis 10/10/2023 None Seen  None Seen, 0-3 /hpf Final  . Red Blood Cells, Urinalysis 10/10/2023 None Seen  None Seen, 0-3 /hpf Final  . Bacteria, Urinalysis 10/10/2023 None Seen  None Seen /hpf Final  . Squamous Epithelial Cells, Urinaly* 10/10/2023 None Seen  Rare, Few, None Seen /hpf Final  . Hemoglobin A1C 10/10/2023 6.8 (H)  4.2 - 5.6 % Final  . Average Blood Glucose (Calc) 10/10/2023 148  mg/dL Final  Office Visit on 08/01/2023  Component Date Value Ref Range Status  . Vent Rate (bpm) 08/01/2023 75   Final  . QRS Interval (msec) 08/01/2023 146   Final  . QT Interval (msec) 08/01/2023 468   Final  . QTc (msec) 08/01/2023 522   Final  Office Visit on 05/25/2023  Component Date Value Ref Range Status  . WBC (White Blood Cell Count) 05/25/2023 6.1  4.1 - 10.2 10^3/uL Final  . RBC (Red Blood Cell Count) 05/25/2023 4.52 (L)  4.69 - 6.13 10^6/uL Final  . Hemoglobin 05/25/2023 15.0  14.1 - 18.1 gm/dL Final  . Hematocrit 87/79/7975 43.5  40.0 - 52.0 % Final  . MCV (Mean Corpuscular Volume) 05/25/2023 96.2  80.0 - 100.0 fl Final  . MCH (Mean Corpuscular  Hemoglobin) 05/25/2023 33.2 (H)  27.0 - 31.2 pg Final  . MCHC (Mean Corpuscular Hemoglobin * 05/25/2023 34.5  32.0 - 36.0 gm/dL Final  . Platelet Count 05/25/2023 174  150 - 450 10^3/uL Final  . RDW-CV (Red Cell Distribution Widt* 05/25/2023 14.0  11.6 - 14.8 % Final  . MPV (Mean Platelet Volume) 05/25/2023 11.6  9.4 - 12.4 fl Final  . Neutrophils 05/25/2023  4.07  1.50 - 7.80 10^3/uL Final  . Lymphocytes 05/25/2023 1.45  1.00 - 3.60 10^3/uL Final  . Monocytes 05/25/2023 0.47  0.00 - 1.50 10^3/uL Final  . Eosinophils 05/25/2023 0.09  0.00 - 0.55 10^3/uL Final  . Basophils 05/25/2023 0.01  0.00 - 0.09 10^3/uL Final  . Neutrophil % 05/25/2023 66.6  32.0 - 70.0 % Final  . Lymphocyte % 05/25/2023 23.7  10.0 - 50.0 % Final  . Monocyte % 05/25/2023 7.7  4.0 - 13.0 % Final  . Eosinophil % 05/25/2023 1.5  1.0 - 5.0 % Final  . Basophil% 05/25/2023 0.2  0.0 - 2.0 % Final  . Immature Granulocyte % 05/25/2023 0.3  <=0.7 % Final  . Immature Granulocyte Count 05/25/2023 0.02  <=0.06 10^3/L Final  . Glucose 05/25/2023 112 (H)  70 - 110 mg/dL Final  . Sodium 87/79/7975 142  136 - 145 mmol/L Final  . Potassium 05/25/2023 3.8  3.6 - 5.1 mmol/L Final  . Chloride 05/25/2023 103  97 - 109 mmol/L Final  . Carbon Dioxide (CO2) 05/25/2023 29.6  22.0 - 32.0 mmol/L Final  . Urea Nitrogen (BUN) 05/25/2023 13  7 - 25 mg/dL Final  . Creatinine 87/79/7975 0.9  0.7 - 1.3 mg/dL Final  . Glomerular Filtration Rate (eGFR) 05/25/2023 86  >60 mL/min/1.73sq m Final  . Calcium  05/25/2023 9.4  8.7 - 10.3 mg/dL Final  . AST  87/79/7975 28  8 - 39 U/L Final  . ALT  05/25/2023 30  6 - 57 U/L Final  . Alk Phos (alkaline Phosphatase) 05/25/2023 97  34 - 104 U/L Final  . Albumin 05/25/2023 4.5  3.5 - 4.8 g/dL Final  . Bilirubin, Total 05/25/2023 0.7  0.3 - 1.2 mg/dL Final  . Protein, Total 05/25/2023 6.7  6.1 - 7.9 g/dL Final  . A/G Ratio 87/79/7975 2.0  1.0 - 5.0 gm/dL Final  . Thyroid  Stimulating Hormone (TSH) 05/25/2023  3.956  0.450-5.330 uIU/ml uIU/mL Final  . Color 05/25/2023 Yellow  Colorless, Straw, Light Yellow, Yellow, Dark Yellow Final  . Clarity 05/25/2023 Clear  Clear Final  . Specific Gravity 05/25/2023 1.022  1.005 - 1.030 Final  . pH, Urine 05/25/2023 6.5  5.0 - 8.0 Final  . Protein, Urinalysis 05/25/2023 2+ (!)  Negative mg/dL Final  . Glucose, Urinalysis 05/25/2023 Negative  Negative mg/dL Final  . Ketones, Urinalysis 05/25/2023 Negative  Negative mg/dL Final  . Blood, Urinalysis 05/25/2023 Negative  Negative Final  . Nitrite, Urinalysis 05/25/2023 Negative  Negative Final  . Leukocyte Esterase, Urinalysis 05/25/2023 Negative  Negative Final  . Bilirubin, Urinalysis 05/25/2023 Negative  Negative Final  . Urobilinogen, Urinalysis 05/25/2023 2.0 (H)  0.2 - 1.0 mg/dL Final  . WBC, UA 87/79/7975 1  <=5 /hpf Final  . Red Blood Cells, Urinalysis 05/25/2023 2  <=3 /hpf Final  . Bacteria, Urinalysis 05/25/2023 0-5  0 - 5 /hpf Final  . Squamous Epithelial Cells, Urinaly* 05/25/2023 0  /hpf Final  . Hemoglobin A1C 05/25/2023 6.6 (H)  4.2 - 5.6 % Final  . Average Blood Glucose (Calc) 05/25/2023 143  mg/dL Final   DIAGNOSIS: Essential hypertension, benign  (primary encounter diagnosis)  Pure hypercholesterolemia  Acquired hypothyroidism  Prediabetes  Paroxysmal atrial fibrillation (CMS/HHS-HCC)   PLAN: HTN- stable, same meds HLD- diet/exercise/statin, labs 1 mo Prediabetes- diet/exercise/water , labs 1 mo Thyroid  dz- same dose, labs 1 mo RTC 3 mo, sooner if needed     Attestation Statement:   I personally performed the service. (TP)  Reyes  JONETTA Costa, MD, MD

## 2024-04-04 ENCOUNTER — Other Ambulatory Visit (HOSPITAL_COMMUNITY): Payer: Self-pay

## 2024-04-14 ENCOUNTER — Other Ambulatory Visit (HOSPITAL_COMMUNITY): Payer: Self-pay

## 2024-04-15 ENCOUNTER — Other Ambulatory Visit: Payer: Self-pay

## 2024-05-05 DIAGNOSIS — R7303 Prediabetes: Secondary | ICD-10-CM | POA: Diagnosis not present

## 2024-05-05 DIAGNOSIS — E7800 Pure hypercholesterolemia, unspecified: Secondary | ICD-10-CM | POA: Diagnosis not present

## 2024-05-05 DIAGNOSIS — I10 Essential (primary) hypertension: Secondary | ICD-10-CM | POA: Diagnosis not present

## 2024-05-05 DIAGNOSIS — E039 Hypothyroidism, unspecified: Secondary | ICD-10-CM | POA: Diagnosis not present

## 2024-05-05 DIAGNOSIS — Z79899 Other long term (current) drug therapy: Secondary | ICD-10-CM | POA: Diagnosis not present

## 2024-05-12 NOTE — Progress Notes (Unsigned)
 Cardiology Clinic Note   Date: 05/12/2024 ID: BRENN DEZIEL, DOB 1941-05-28, MRN [id number]  Primary Cardiologist:  Redell Cave, MD  Chief Complaint   KASHIS PENLEY is a 83 y.o. male who presents to the clinic today for ***  Patient Profile   DAVINE COBA is followed by *** for the history outlined below.      Past medical history significant for: CAD. PCI 2010 LHC 07/25/2017 (chest pain): Proximal RCA 100% with collaterals from LCx.  Proximal to mid LAD 95%.  OM2 10%.  Lat OM 2 90% in-stent restenosis.  Mid to distal LCx 90%.  Patent stent to mid LCx into large OM 2.  CT surgery referral. CABG x 4 08/06/2017: LIMA to LAD, SVG to PDA, SVG to D1, SVG to OM1. PAF/a-flutter. Onset postop CABG March 2019. Recurrence 2023. DCCV 10/12/2023. Chronic diastolic heart failure/pulmonary hypertension/aortic valve stenosis. Echo 11/19/2023: EF 55 to 60%.  No RWMA.  Indeterminate diastolic parameters.  Interventricular septum is flattened in diastole consistent with right ventricular volume overload.  Normal RV function.  Mild RVH.  Moderately elevated PA pressure, RVSP 42 mmHg.  Mild to moderate LAE.  Severe RAE.  Mild MR.  Mild to moderate TR.  Mild aortic valve stenosis mean gradient 11 mmHg.  Dilated IVC, RA pressure 15 mmHg. Carotid artery stenosis. Carotid stent placement 11/08/2021: Left ICA. Carotid duplex 09/10/2023: Right ICA 40 to 59%, hemodynamically significant plaque > 50% right CCA.  Widely patent stent left ICA, nonhemodynamically significant plaque <50% left CCA. Hypertension. Hyperlipidemia. Lipid panel 10/10/2023: LDL 78, HDL 25, TG 77, total 118. TIA. GERD. Hypothyroidism. Migraines.  In summary, patient was previously followed by Dr. Bosie and Dr. Florencio at [facility] cardiology.  He has a history of CAD with PCI in 2010.  In February 2019 he underwent LHC for chest pain showing three-vessel CAD.  He underwent CABG x 4 in March 2019.  Postop course complicated by  A-fib.  Patient had a TIA in February 2021.  Echo at that time demonstrated EF 55 to 60%, no RWMA, Grade I DD, normal RV size, normal PA pressure, aortic valve normal in structure and function.  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  Holter monitor October 2023 revealed A-fib/flutter average rate 59 bpm.  He was started on Eliquis  for stroke prophylaxis.  He has carotid artery disease followed by vascular surgery.  He had left ICA stent placement in June 2023.  Patient was first evaluated by Dr. Cave on 10/01/2023 to establish care.  EKG revealed A-fib 92 bpm and he was scheduled for DCCV.  He was referred to EP.  Patient was last seen in the office by Dr. Cindie on 11/28/2023 for evaluation of A-fib.  He reported doing well 1 to 2 weeks post cardioversion with clear thinking, getting around faster, and improved breathing.  At the time of his visit with Dr. Cindie he complained of recurrence of symptoms with trouble thinking and poor mobility.  EKG showed A-fib 72 bpm.  Decision was made to pursue rhythm control.  He was started on amiodarone  with plan for cardioversion 6 to 8 weeks following.   Patient was seen in the office on 12/13/2023 for evaluation of edema.  Patient noted mild lower extremity edema after shoulder surgery that progressed up his leg to his abdomen including his scrotal area.  He denies shortness of breath, orthopnea, PND.  He had been sleeping in a recliner secondary to shoulder surgery but was able to lay all the way back without dyspnea.  He was started on torsemide  by his PCP and noted brisk diuresis at first.  He reported edema below his knees improved but he continued to have edema above his knees to his abdomen.  He was weighing daily.  Normal weight 175 and weight that morning 184.  Patient was bradycardic at 44 bpm at the time of his visit  without lightheadedness, dizziness, presyncope, syncope.  Toprol  was decreased to 50 mg daily.  He was started on Farxiga .  Labs were drawn at the time of his visit and kidney function had worsened.  He was instructed to decrease torsemide  to 20 mg daily and return for recheck of BMP.   Patient was seen in clinic on 01/15/2024 for follow-up after medication changes.  He reported slight improvement in edema after starting Farxiga .  He was evaluated by urology for scrotal edema and was told it was caused by amiodarone  and he should stop it.  Patient states that the edema started before starting amiodarone  and he would like to continue it.  Patient stopped torsemide  on 7/30 at the advice of PCP secondary to kidney function.  He reported lower extremity edema below the knee remained well-managed with compression socks however he continued to have edema above his knee and into his scrotum and abdomen.  He reported weight gain, DOE and fatigue but no orthopnea or PND.  He was instructed to restart torsemide  at 20 mg daily.  BNP was slightly elevated.  Kidney function was somewhat improved on repeat labs.  Upon follow-up in September 2025 he reported feeling greatly improved.  Scrotal edema was slowly resolving.  Daily weight was stable.  He denied dyspnea.  He reported 2 mechanical falls related to developing dizziness with position changes.  Heart rate 48 bpm at the time of his visit.  Amiodarone  was decreased to 100 mg daily.  Patient was last seen in the office by Chantal Needle, NP on 03/17/2024 for A-fib follow-up.  He was doing well at that time with no recent falls.  No medication changes were were made.     History of Present Illness    Today, patient ***  CAD S/p PCI 2010, CABG x 06 August 2017.  Patient denies chest pain, pressure or tightness.*** - Continue Toprol , atorvastatin , fenofibrate .  Patient on Plavix  for history of TIA while on Eliquis .   Chronic diastolic heart failure/pulmonary  hypertension/aortic valve stenosis Echo June 2025 demonstrated EF 55 to 60%, interventricular septum is flattened in diastole consistent with right ventricular volume overload, normal RV function, mild RVH, moderately elevated PA pressure, mild to moderate LAE, severe RAE, mild MR, mild to moderate TR, mild aortic valve stenosis mean gradient 11 mmHg.  Patient***2/6 systolic murmur. Euvolemic and well compensated on exam.  - Continue Toprol , lisinopril , Farxiga , torsemide . - Weigh daily.    PAF/positional dizziness Initial onset postop CABG March 2019, recurrence 2023.  S/p DCCV May 2025.  Denies spontaneous bleeding concerns.  Patient *** - Continue Toprol , amiodarone .  - Given age and creatinine will decrease Eliquis  to 2.5 mg bid ***   Carotid artery disease S/p left ICA stent placement June 2023.  Carotid duplex April 2025 showed right ICA 40 to 59%, widely patent stent left ICA.  Patient *** - Continue atorvastatin , fenofibrate , Plavix . - Continue to follow with vascular surgery.   Hypertension BP today *** - Discussed slow position changes  and not dropping head down.  - Continue lisinopril , Toprol .   Hyperlipidemia LDL 78 May 2025, not at goal.  - Continue atorvastatin  and fenofibrate .  ROS: All other systems reviewed and are otherwise negative except as noted in History of Present Illness.  EKGs/Labs Reviewed        02/15/2024: BUN 23; Creatinine, Ser 1.48; Potassium 4.3; Sodium 142 03/17/2024: ALT 26; AST 33   10/01/2023: Hemoglobin 13.9; WBC 6.6   03/17/2024: TSH 4.540   01/15/2024: BNP 462.6  ***  Risk Assessment/Calculations    {Does this patient have ATRIAL FIBRILLATION?:[id number]} No BP recorded.  {Refresh Note OR Click here to enter BP  :1}***        Physical Exam    VS:  There were no vitals taken for this visit. , BMI There is no height or weight on file to calculate BMI.  GEN: Well nourished, well developed, in no acute distress. Neck: No JVD or  carotid bruits. Cardiac: *** RRR. *** No murmur. No rubs or gallops.   Respiratory:  Respirations regular and unlabored. Clear to auscultation without rales, wheezing or rhonchi. GI: Soft, nontender, nondistended. Extremities: Radials/DP/PT 2+ and equal bilaterally. No clubbing or cyanosis. No edema ***  Skin: Warm and dry, no rash. Neuro: Strength intact.  Assessment & Plan   ***  Disposition: ***     {Are you ordering a CV Procedure (e.g. stress test, cath, DCCV, TEE, etc)?   Press F2        :[id number]}   Signed, Barnie HERO. Atsushi Yom, DNP, NP-C

## 2024-05-16 ENCOUNTER — Encounter: Payer: Self-pay | Admitting: Student

## 2024-05-16 ENCOUNTER — Ambulatory Visit: Attending: Student | Admitting: Student

## 2024-05-16 VITALS — BP 132/68 | HR 55 | Resp 20 | Ht 64.0 in | Wt 181.6 lb

## 2024-05-16 DIAGNOSIS — Z79899 Other long term (current) drug therapy: Secondary | ICD-10-CM

## 2024-05-16 DIAGNOSIS — I10 Essential (primary) hypertension: Secondary | ICD-10-CM

## 2024-05-16 DIAGNOSIS — I25810 Atherosclerosis of coronary artery bypass graft(s) without angina pectoris: Secondary | ICD-10-CM

## 2024-05-16 DIAGNOSIS — I480 Paroxysmal atrial fibrillation: Secondary | ICD-10-CM | POA: Diagnosis not present

## 2024-05-16 DIAGNOSIS — I350 Nonrheumatic aortic (valve) stenosis: Secondary | ICD-10-CM

## 2024-05-16 DIAGNOSIS — R5383 Other fatigue: Secondary | ICD-10-CM | POA: Diagnosis not present

## 2024-05-16 DIAGNOSIS — E785 Hyperlipidemia, unspecified: Secondary | ICD-10-CM

## 2024-05-16 DIAGNOSIS — I6523 Occlusion and stenosis of bilateral carotid arteries: Secondary | ICD-10-CM

## 2024-05-16 DIAGNOSIS — I272 Pulmonary hypertension, unspecified: Secondary | ICD-10-CM | POA: Diagnosis not present

## 2024-05-16 DIAGNOSIS — I5032 Chronic diastolic (congestive) heart failure: Secondary | ICD-10-CM | POA: Diagnosis not present

## 2024-05-16 NOTE — Patient Instructions (Signed)
 Medication Instructions:   Your physician recommends that you continue on your current medications as directed. Please refer to the Current Medication list given to you today.    *If you need a refill on your cardiac medications before your next appointment, please call your pharmacy*  Lab Work:  Your provider would like for you to have following labs drawn today BMP.    If you have labs (blood work) drawn today and your tests are completely normal, you will receive your results only by:  MyChart Message (if you have MyChart) OR  A paper copy in the mail If you have any lab test that is abnormal or we need to change your treatment, we will call you to review the results.  Testing/Procedures:  None ordered at this time   Referrals:  None ordered at this time   Follow-Up:  At [facility], you and your health needs are our priority.  As part of our continuing mission to provide you with exceptional heart care, our providers are all part of one team.  This team includes your primary Cardiologist (physician) and Advanced Practice Providers or APPs (Physician Assistants and Nurse Practitioners) who all work together to provide you with the care you need, when you need it.  Your next appointment:   5 - 6 month(s)  Provider:    Redell Cave, MD or Barnie Hila, NP    We recommend signing up for the patient portal called MyChart.  Sign up information is provided on this After Visit Summary.  MyChart is used to connect with patients for Virtual Visits (Telemedicine).  Patients are able to view lab/test results, encounter notes, upcoming appointments, etc.  Non-urgent messages can be sent to your provider as well.   To learn more about what you can do with MyChart, go to [email or web address].

## 2024-05-17 ENCOUNTER — Ambulatory Visit: Payer: Self-pay | Admitting: Student

## 2024-05-17 DIAGNOSIS — Z79899 Other long term (current) drug therapy: Secondary | ICD-10-CM

## 2024-05-17 LAB — BASIC METABOLIC PANEL WITH GFR
BUN/Creatinine Ratio: 19 (ref 10–24)
BUN: 32 mg/dL — ABNORMAL HIGH (ref 8–27)
CO2: 21 mmol/L (ref 20–29)
Calcium: 9.4 mg/dL (ref 8.6–10.2)
Chloride: 103 mmol/L (ref 96–106)
Creatinine, Ser: 1.7 mg/dL — ABNORMAL HIGH (ref 0.76–1.27)
Glucose: 98 mg/dL (ref 70–99)
Potassium: 4.4 mmol/L (ref 3.5–5.2)
Sodium: 141 mmol/L (ref 134–144)
eGFR: 40 mL/min/1.73 — ABNORMAL LOW (ref >59)

## 2024-05-19 ENCOUNTER — Other Ambulatory Visit (HOSPITAL_COMMUNITY): Payer: Self-pay

## 2024-05-19 ENCOUNTER — Other Ambulatory Visit: Payer: Self-pay

## 2024-05-19 ENCOUNTER — Other Ambulatory Visit: Payer: Self-pay | Admitting: Emergency Medicine

## 2024-05-19 MED ORDER — APIXABAN 5 MG PO TABS
5.0000 mg | ORAL_TABLET | Freq: Two times a day (BID) | ORAL | 1 refills | Status: DC
  Filled 2024-05-19: qty 180, 90d supply, fill #0

## 2024-05-19 MED ORDER — TRIAMCINOLONE ACETONIDE 0.5 % EX CREA
TOPICAL_CREAM | CUTANEOUS | 1 refills | Status: AC
  Filled 2024-05-19: qty 90, 30d supply, fill #0

## 2024-05-19 MED ORDER — TORSEMIDE 20 MG PO TABS: 20.0000 mg | ORAL_TABLET | Freq: Every day | ORAL | Status: AC | PRN

## 2024-05-19 MED ORDER — APIXABAN 2.5 MG PO TABS: 2.5000 mg | ORAL_TABLET | Freq: Two times a day (BID) | ORAL | Status: DC

## 2024-05-19 NOTE — Progress Notes (Signed)
 Patient called. Left voicemail, per DPR, to reduce Eliquis  from 5 to 2.5 mg two times daily. Call back number provided. Medication changes made to [facility] in EPIC.

## 2024-05-19 NOTE — Progress Notes (Signed)
 [place], patient's wanted to know if you had decided if the patient needed to reduce his Eliquis  to 2.5 mg daily?

## 2024-05-20 ENCOUNTER — Other Ambulatory Visit: Payer: Self-pay

## 2024-06-03 ENCOUNTER — Other Ambulatory Visit: Payer: Self-pay

## 2024-06-03 MED ORDER — APIXABAN 2.5 MG PO TABS: 2.5000 mg | ORAL_TABLET | Freq: Two times a day (BID) | ORAL | 0 refills | Status: AC

## 2024-06-17 MED ORDER — DAPAGLIFLOZIN PROPANEDIOL 10 MG PO TABS: 10.0000 mg | ORAL_TABLET | Freq: Every day | ORAL | 1 refills | Status: DC

## 2024-07-07 ENCOUNTER — Other Ambulatory Visit: Payer: Self-pay | Admitting: Student

## 2024-07-09 NOTE — Telephone Encounter (Signed)
 Labs on 05/16/24 Outside Normal Range  In accordance with refill protocols, please review and address the following requirements before this medication refill can be authorized:  Labs

## 2024-07-11 MED ORDER — DAPAGLIFLOZIN PROPANEDIOL 10 MG PO TABS: 10.0000 mg | ORAL_TABLET | Freq: Every day | ORAL | 1 refills | Status: AC

## 2024-09-08 ENCOUNTER — Encounter (INDEPENDENT_AMBULATORY_CARE_PROVIDER_SITE_OTHER)

## 2024-09-08 ENCOUNTER — Ambulatory Visit (INDEPENDENT_AMBULATORY_CARE_PROVIDER_SITE_OTHER): Admitting: Vascular Surgery

## 2024-10-30 ENCOUNTER — Ambulatory Visit: Admitting: Student
